# Patient Record
Sex: Female | Born: 1940
Health system: Southern US, Community
[De-identification: ages and names within clinical notes are randomized; demographics above are authoritative.]

## PROBLEM LIST (undated history)

## (undated) DIAGNOSIS — K449 Diaphragmatic hernia without obstruction or gangrene: Secondary | ICD-10-CM

## (undated) DIAGNOSIS — F32A Depression, unspecified: Secondary | ICD-10-CM

## (undated) DIAGNOSIS — I10 Essential (primary) hypertension: Secondary | ICD-10-CM

## (undated) DIAGNOSIS — K222 Esophageal obstruction: Secondary | ICD-10-CM

## (undated) DIAGNOSIS — E785 Hyperlipidemia, unspecified: Secondary | ICD-10-CM

## (undated) DIAGNOSIS — K279 Peptic ulcer, site unspecified, unspecified as acute or chronic, without hemorrhage or perforation: Secondary | ICD-10-CM

## (undated) DIAGNOSIS — Z86010 Personal history of colonic polyps: Secondary | ICD-10-CM

## (undated) DIAGNOSIS — Z860101 Personal history of adenomatous and serrated colon polyps: Secondary | ICD-10-CM

## (undated) DIAGNOSIS — Z800 Family history of malignant neoplasm of digestive organs: Secondary | ICD-10-CM

## (undated) DIAGNOSIS — F419 Anxiety disorder, unspecified: Secondary | ICD-10-CM

## (undated) DIAGNOSIS — F329 Major depressive disorder, single episode, unspecified: Secondary | ICD-10-CM

## (undated) DIAGNOSIS — R42 Dizziness and giddiness: Secondary | ICD-10-CM

## (undated) DIAGNOSIS — K5790 Diverticulosis of intestine, part unspecified, without perforation or abscess without bleeding: Secondary | ICD-10-CM

## (undated) DIAGNOSIS — K219 Gastro-esophageal reflux disease without esophagitis: Secondary | ICD-10-CM

## (undated) DIAGNOSIS — E079 Disorder of thyroid, unspecified: Secondary | ICD-10-CM

## (undated) DIAGNOSIS — A048 Other specified bacterial intestinal infections: Secondary | ICD-10-CM

## (undated) HISTORY — DX: Gastro-esophageal reflux disease without esophagitis: K21.9

## (undated) HISTORY — DX: Hyperlipidemia, unspecified: E78.5

## (undated) HISTORY — DX: Personal history of adenomatous and serrated colon polyps: Z86.0101

## (undated) HISTORY — PX: BILATERAL OOPHORECTOMY: SHX1221

## (undated) HISTORY — PX: ABDOMINAL HYSTERECTOMY: SHX81

## (undated) HISTORY — DX: Family history of malignant neoplasm of digestive organs: Z80.0

## (undated) HISTORY — DX: Diaphragmatic hernia without obstruction or gangrene: K44.9

## (undated) HISTORY — DX: Dizziness and giddiness: R42

## (undated) HISTORY — DX: Diverticulosis of intestine, part unspecified, without perforation or abscess without bleeding: K57.90

## (undated) HISTORY — DX: Peptic ulcer, site unspecified, unspecified as acute or chronic, without hemorrhage or perforation: K27.9

## (undated) HISTORY — DX: Major depressive disorder, single episode, unspecified: F32.9

## (undated) HISTORY — DX: Other specified bacterial intestinal infections: A04.8

## (undated) HISTORY — DX: Personal history of colonic polyps: Z86.010

## (undated) HISTORY — DX: Depression, unspecified: F32.A

## (undated) HISTORY — DX: Disorder of thyroid, unspecified: E07.9

## (undated) HISTORY — PX: CHOLECYSTECTOMY: SHX55

## (undated) HISTORY — DX: Essential (primary) hypertension: I10

## (undated) HISTORY — DX: Esophageal obstruction: K22.2

## (undated) HISTORY — DX: Anxiety disorder, unspecified: F41.9

---

## 1995-08-26 DIAGNOSIS — K279 Peptic ulcer, site unspecified, unspecified as acute or chronic, without hemorrhage or perforation: Secondary | ICD-10-CM

## 1995-08-26 DIAGNOSIS — A048 Other specified bacterial intestinal infections: Secondary | ICD-10-CM

## 1995-08-26 HISTORY — DX: Other specified bacterial intestinal infections: A04.8

## 1995-08-26 HISTORY — DX: Peptic ulcer, site unspecified, unspecified as acute or chronic, without hemorrhage or perforation: K27.9

## 1997-12-18 ENCOUNTER — Ambulatory Visit (HOSPITAL_COMMUNITY): Admission: RE | Admit: 1997-12-18 | Discharge: 1997-12-18 | Payer: Self-pay | Admitting: Gastroenterology

## 1999-06-14 ENCOUNTER — Encounter: Payer: Self-pay | Admitting: Internal Medicine

## 1999-06-14 ENCOUNTER — Ambulatory Visit (HOSPITAL_COMMUNITY): Admission: RE | Admit: 1999-06-14 | Discharge: 1999-06-14 | Payer: Self-pay | Admitting: Internal Medicine

## 1999-07-01 ENCOUNTER — Emergency Department (HOSPITAL_COMMUNITY): Admission: EM | Admit: 1999-07-01 | Discharge: 1999-07-01 | Payer: Self-pay | Admitting: Emergency Medicine

## 1999-08-31 ENCOUNTER — Encounter: Payer: Self-pay | Admitting: Internal Medicine

## 1999-08-31 ENCOUNTER — Inpatient Hospital Stay (HOSPITAL_COMMUNITY): Admission: EM | Admit: 1999-08-31 | Discharge: 1999-08-31 | Payer: Self-pay | Admitting: Emergency Medicine

## 2002-09-06 ENCOUNTER — Other Ambulatory Visit: Admission: RE | Admit: 2002-09-06 | Discharge: 2002-09-06 | Payer: Self-pay | Admitting: Gynecology

## 2003-10-30 ENCOUNTER — Other Ambulatory Visit: Admission: RE | Admit: 2003-10-30 | Discharge: 2003-10-30 | Payer: Self-pay | Admitting: Gynecology

## 2004-12-17 ENCOUNTER — Other Ambulatory Visit: Admission: RE | Admit: 2004-12-17 | Discharge: 2004-12-17 | Payer: Self-pay | Admitting: Gynecology

## 2005-02-19 ENCOUNTER — Ambulatory Visit: Payer: Self-pay | Admitting: Gastroenterology

## 2005-04-21 ENCOUNTER — Ambulatory Visit: Payer: Self-pay | Admitting: Gastroenterology

## 2005-12-03 ENCOUNTER — Ambulatory Visit: Payer: Self-pay | Admitting: Gastroenterology

## 2006-01-13 ENCOUNTER — Ambulatory Visit: Payer: Self-pay | Admitting: Gastroenterology

## 2006-03-16 ENCOUNTER — Other Ambulatory Visit: Admission: RE | Admit: 2006-03-16 | Discharge: 2006-03-16 | Payer: Self-pay | Admitting: Gynecology

## 2006-04-01 ENCOUNTER — Ambulatory Visit: Payer: Self-pay | Admitting: Gastroenterology

## 2006-04-07 ENCOUNTER — Ambulatory Visit: Payer: Self-pay | Admitting: Gastroenterology

## 2006-09-03 ENCOUNTER — Ambulatory Visit: Payer: Self-pay | Admitting: Gastroenterology

## 2007-05-04 ENCOUNTER — Ambulatory Visit: Payer: Self-pay | Admitting: Internal Medicine

## 2007-11-11 DIAGNOSIS — F411 Generalized anxiety disorder: Secondary | ICD-10-CM | POA: Insufficient documentation

## 2007-11-11 DIAGNOSIS — K222 Esophageal obstruction: Secondary | ICD-10-CM | POA: Insufficient documentation

## 2007-11-11 DIAGNOSIS — F3289 Other specified depressive episodes: Secondary | ICD-10-CM | POA: Insufficient documentation

## 2007-11-11 DIAGNOSIS — K449 Diaphragmatic hernia without obstruction or gangrene: Secondary | ICD-10-CM | POA: Insufficient documentation

## 2007-11-11 DIAGNOSIS — I10 Essential (primary) hypertension: Secondary | ICD-10-CM | POA: Insufficient documentation

## 2007-11-11 DIAGNOSIS — K5730 Diverticulosis of large intestine without perforation or abscess without bleeding: Secondary | ICD-10-CM | POA: Insufficient documentation

## 2007-11-11 DIAGNOSIS — K649 Unspecified hemorrhoids: Secondary | ICD-10-CM | POA: Insufficient documentation

## 2007-11-11 DIAGNOSIS — K219 Gastro-esophageal reflux disease without esophagitis: Secondary | ICD-10-CM | POA: Insufficient documentation

## 2007-11-11 DIAGNOSIS — D126 Benign neoplasm of colon, unspecified: Secondary | ICD-10-CM | POA: Insufficient documentation

## 2007-11-11 DIAGNOSIS — F329 Major depressive disorder, single episode, unspecified: Secondary | ICD-10-CM | POA: Insufficient documentation

## 2008-04-18 ENCOUNTER — Telehealth: Payer: Self-pay | Admitting: Internal Medicine

## 2008-04-24 ENCOUNTER — Telehealth: Payer: Self-pay | Admitting: Internal Medicine

## 2008-05-04 ENCOUNTER — Telehealth: Payer: Self-pay | Admitting: Internal Medicine

## 2008-07-17 ENCOUNTER — Telehealth: Payer: Self-pay | Admitting: Internal Medicine

## 2008-08-01 ENCOUNTER — Telehealth: Payer: Self-pay | Admitting: Internal Medicine

## 2008-08-08 ENCOUNTER — Encounter: Payer: Self-pay | Admitting: Internal Medicine

## 2008-08-22 ENCOUNTER — Telehealth: Payer: Self-pay | Admitting: Internal Medicine

## 2008-08-23 ENCOUNTER — Telehealth (INDEPENDENT_AMBULATORY_CARE_PROVIDER_SITE_OTHER): Payer: Self-pay | Admitting: *Deleted

## 2008-09-12 ENCOUNTER — Telehealth: Payer: Self-pay | Admitting: Internal Medicine

## 2009-05-16 ENCOUNTER — Telehealth: Payer: Self-pay | Admitting: Internal Medicine

## 2009-06-22 ENCOUNTER — Telehealth: Payer: Self-pay | Admitting: Internal Medicine

## 2009-07-12 ENCOUNTER — Telehealth: Payer: Self-pay | Admitting: Internal Medicine

## 2009-07-17 ENCOUNTER — Telehealth: Payer: Self-pay | Admitting: Internal Medicine

## 2009-07-27 ENCOUNTER — Telehealth: Payer: Self-pay | Admitting: Internal Medicine

## 2009-08-07 ENCOUNTER — Telehealth: Payer: Self-pay | Admitting: Internal Medicine

## 2009-08-20 ENCOUNTER — Telehealth: Payer: Self-pay | Admitting: Internal Medicine

## 2009-08-22 ENCOUNTER — Telehealth: Payer: Self-pay | Admitting: Internal Medicine

## 2009-09-07 ENCOUNTER — Telehealth: Payer: Self-pay | Admitting: Internal Medicine

## 2009-09-14 ENCOUNTER — Encounter: Payer: Self-pay | Admitting: Internal Medicine

## 2009-11-01 ENCOUNTER — Telehealth: Payer: Self-pay | Admitting: Internal Medicine

## 2009-12-25 ENCOUNTER — Encounter: Payer: Self-pay | Admitting: Internal Medicine

## 2010-01-31 ENCOUNTER — Encounter: Payer: Self-pay | Admitting: Internal Medicine

## 2010-04-09 ENCOUNTER — Telehealth: Payer: Self-pay | Admitting: Internal Medicine

## 2010-09-05 ENCOUNTER — Encounter: Payer: Self-pay | Admitting: Internal Medicine

## 2010-09-06 ENCOUNTER — Telehealth: Payer: Self-pay | Admitting: Internal Medicine

## 2010-09-09 ENCOUNTER — Encounter: Payer: Self-pay | Admitting: Internal Medicine

## 2010-09-26 NOTE — Progress Notes (Signed)
Summary: samples   Phone Note Call from Patient Call back at Home Phone [phone]   Caller: Patient Call For: Dr Marina Goodell Reason for Call: Talk to Nurse Summary of Call: Patient would like samples of Protonix  Initial call taken by: Tawni Levy,  April 09, 2010 12:48 PM  Follow-up for Phone Call        Advised pt. we do not have samples of Protonix.  She states she needs brand name Protonix.  I will send Rx. to pharmacy and await for them to send me a PA for brand name.   Follow-up by: Milford Cage NCMA,  April 09, 2010 2:57 PM

## 2010-09-26 NOTE — Medication Information (Signed)
Summary: Pantoprazole approved/[employer]  Pantoprazole approved/[employer]   Imported By: Sherian Rein 09/12/2010 10:49:45  _____________________________________________________________________  External Attachment:    Type:   Image     Comment:   External Document

## 2010-09-26 NOTE — Medication Information (Signed)
Summary: Pantoprazole/[facility]  Pantoprazole/[facility]   Imported By: Sherian Rein 02/04/2010 11:54:43  _____________________________________________________________________  External Attachment:    Type:   Image     Comment:   External Document

## 2010-09-26 NOTE — Progress Notes (Signed)
Summary: meds not working   Phone Note Call from Patient Call back at [employer] [phone]   Caller: Patient Call For: Dr Marina Goodell Reason for Call: Talk to Nurse Summary of Call: Patient states that Protonix is not helping it's giving her headaches and gas, wants somehting different. Initial call taken by: Tawni Levy,  September 06, 2010 10:35 AM  Follow-up for Phone Call        patient only wants to speak with [facility].  "she knows my situations" Follow-up by: Darcey Nora RN, CGRN,  September 06, 2010 10:55 AM  Additional Follow-up for Phone Call Additional follow up Details #1::        pt. states she is feeling much better now and thinks she had some kind of "bug".  She states the generic Protonix is not working as well as brand name.  She will continue on the generic for awhile and see if it works, as her insurance will not approve brand name.  Advised patient to call us Monday if symptoms come back or worsen over the weekend.  She agreed to this. Additional Follow-up by: Milford Cage NCMA,  September 06, 2010 3:34 PM

## 2010-09-26 NOTE — Progress Notes (Signed)
Summary: Samples of Medication   Phone Note Call from Patient Call back at Home Phone [phone]   Caller: Patient Call For: Dr. Marina Goodell Reason for Call: Refill Medication Summary of Call: Pt wants to know if we have any sample of Protonix Initial call taken by: Karna Christmas,  September 07, 2009 9:52 AM  Follow-up for Phone Call        Called patient and advised we do not have any samples of Protonix.   Milford Cage [facility]  September 07, 2009 1:30 PM

## 2010-09-26 NOTE — Medication Information (Signed)
Summary: Anucort/[facility]  Anucort/[facility]   Imported By: Sherian Rein 12/27/2009 11:56:39  _____________________________________________________________________  External Attachment:    Type:   Image     Comment:   External Document

## 2010-09-26 NOTE — Medication Information (Signed)
Summary: Refill request for Protonix/[facility]  Refill request for Protonix/[facility]   Imported By: Sherian Rein 09/19/2009 14:45:48  _____________________________________________________________________  External Attachment:    Type:   Image     Comment:   External Document

## 2010-09-26 NOTE — Progress Notes (Signed)
Summary: Samples of Protonix   Phone Note Call from Patient Call back at Home Phone [phone]   Caller: Patient Call For: Dr. Marina Goodell Reason for Call: Talk to Nurse Summary of Call: Pt is wanting to know if we have any samples of Protonix.[employer] will not cover Protonix just the generic Initial call taken by: Karna Christmas,  November 01, 2009 9:30 AM  Follow-up for Phone Call        Called patient and advised we do not have any samples of Protonix.  She stated she had gotten generic of protonix since insurance will pay for it and will try for awhile to see if it works. I advised her to call me if it doesn't.  She agreed.  Follow-up by: Milford Cage NCMA,  November 01, 2009 9:33 AM

## 2010-09-26 NOTE — Medication Information (Signed)
Summary: Prior autho & approved for Pantoprazole/[employer]  Prior autho & approved for Pantoprazole/[employer]   Imported By: Sherian Rein 09/11/2010 14:57:25  _____________________________________________________________________  External Attachment:    Type:   Image     Comment:   External Document

## 2010-10-28 ENCOUNTER — Encounter: Payer: Self-pay | Admitting: Internal Medicine

## 2011-01-06 ENCOUNTER — Telehealth: Payer: Self-pay | Admitting: Internal Medicine

## 2011-01-06 NOTE — Telephone Encounter (Signed)
Pt states that she is having problems with her reflux. Pt takes Protonix 40mg  BID but reports she is still having problems with burning and indigestion. Pt states that she has been sleeping in her recliner because when she lays down the acid comes up in her throat and burns. Pt requests to be seen. Appt made for pt to see Amy Esterwood PA 01/08/11@10 :30am.

## 2011-01-07 NOTE — Assessment & Plan Note (Signed)
[facility]                         GASTROENTEROLOGY OFFICE NOTE   NAME:Susan Contreras, Susan Contreras                         MRN:          [id number]  DATE:05/04/2007                            DOB:          May 22, 1941    REASON FOR EVALUATION:  Establish for GI care.   HISTORY:  This is a 70 year old female with a history of hypertension,  gastroesophageal reflux disease, and colon polyps. I actually saw the  patient in 1997 for dysphagia. She was found to have esophagitis with a  stricture as well as gastritis. She was positive for H. Pylori. Her  stricture was dilated. Her H. Pylori treated. She was placed on proton  pump inhibitor therapy. She subsequently came under the care of Dr.  Corinda Gubler in 1998, who has been seeing the patient. She has had several  colonoscopies, last of which in 2007 was negative except for  diverticulosis and hemorrhoids. Her last endoscopy was performed at that  same time. She was noted to have a hiatal hernia and a stricture. She  has been on Protonix 40 mg daily with occasional b.i.d. dosage for  breakthrough. On the medication no significant heartburn or indigestion.  No dysphagia. No other active GI symptoms. She requests a prescription  for her Protonix as well as Protonix samples.   CURRENT MEDICATIONS:  Include: Avapro, Protonix, Vivelle hormone patch,  gas-x, and Tylenol.   FAMILY HISTORY:  Per diagnostic evaluation form.   SOCIAL HISTORY:  Per diagnostic evaluation form.   REVIEW OF SYSTEMS:  Per diagnostic evaluation form.   PHYSICAL EXAMINATION:  Well-appearing female in no acute distress. Her  blood pressure is 120/76, heart rate 64, weight is 166.6 pounds.  HEENT: Sclera are anicteric, conjunctivae are pink. Oral mucosa is  intact. No adenopathy.  LUNGS: Clear.  HEART: Regular.  ABDOMEN: Soft without tenderness, masses, or hernia.  EXTREMITIES: Without edema.   IMPRESSION:  1. Gastroesophageal reflux disease with  a history of peptic stricture.      Currently asymptomatic on Protonix.  2. Remote history of polyps. Last colonoscopy with diverticulosis      only.  3. General medical problems, under the care of Dr. Christell Constant.   RECOMMENDATIONS:  1. Continue Protonix 40 mg daily. A prescription with multiple refills      has been provided as well      as samples at her request.  2. Resume general medical care with Dr. Christell Constant. Gastrointestinal follow      up p.r.n.     Wilhemina Bonito. Marina Goodell, MD  Electronically Signed    JNP/MedQ  DD: 05/04/2007  DT: 05/05/2007  Job #: [id number]

## 2011-01-08 ENCOUNTER — Ambulatory Visit: Payer: Self-pay | Admitting: Physician Assistant

## 2011-01-10 NOTE — Assessment & Plan Note (Signed)
[facility]                         GASTROENTEROLOGY OFFICE NOTE   NAME:[name], [name]                         MRN:          [id number]  DATE:09/03/2006                            DOB:          04/01/1941    Susan Contreras comes in, says she is having some epigastric problems and right  upper quadrant discomfort, burping, belching symptoms.  It sounded to me  like gallbladder.  She says when she uses butter that it really sets it  off the worst.  Some reflux symptoms, some change in bowel habits.  She  has been taking her Protonix and Flora-Q.  I told her that I thought she  ought to reconsider the gallbladder as the culprit here and give it two  or three more weeks maybe and see how she does, but if her symptoms  persist with the same type of thing - and I gave her some literature on  gallbladder disease and laparoscopic cholecystectomy, intestinal gas,  and so on, with low fat diet - that she should return to see Dr. Wenda Low about having a cholecystectomy.  She is somewhat anxious about  it.  She said she would consider doing this.   PHYSICAL EXAMINATION:  Weight 170, blood pressure 102/76, pulse 78 and  regular.  Neck, heart and extremities are all unremarkable.   IMPRESSION:  1. Recurrent epigastric discomfort, probably related to gallbladder      disease.  2. History of gastroesophageal reflux disease.  3. Anxiety/depression.  4. Mild obesity.  5. Hypertension.  6. Status post hysterectomy and bilateral oophorectomy.   Recommendation is to take the Protonix b.i.d., I gave her some samples,  and Flora-Q, and told her to rethink the cholecystectomy if her symptoms  recur.     Ulyess Mort, MD  Electronically Signed    SML/MedQ  DD: 09/03/2006  DT: 09/03/2006  Job #: [id number]   cc:   [place] Daphine Deutscher, MD

## 2011-01-10 NOTE — Procedures (Signed)
[facility]                            ABDOMINAL ULTRASOUND REPORT   NAME:JOYCETaeko, Susan Contreras                         MRN:          [id number]  DATE:04/07/2006                            DOB:          1940-11-29    ACCESSION NUMBER:  [id number]   READING PHYSICIAN:  Ulyess Mort, M.D.   INDICATIONS:  Right upper quadrant pain.   RESULTS:  Abdominal aorta is normal measuring 1.5 cm.  The inferior vena  cava is normal.   The pancreas was well-visualized and appears normal throughout.   The gallbladder had a 14 mm mobile shadowing stone.  The walls were slightly  thickened at 3.4 mm.  The common bile duct was at the upper limits of normal  measuring 7.7 mm.   The liver was within normal limits.   The right kidney and left kidney were all within normal limits, and the  spleen was within normal limits.   IMPRESSION:  Cholelithiasis as described with slightly thickened walls of  the gallbladder consistent with possible cholecystitis.  There is no  evidence of pericholecystic fluid.                                   Ulyess Mort, MD   SML/MedQ  DD:  04/07/2006  DT:  04/08/2006  Job #:  [id number]

## 2011-01-10 NOTE — Assessment & Plan Note (Signed)
[facility]                                 ON-CALL NOTE   NAME:Susan Contreras, Susan Contreras                         MRN:          [id number]  DATE:08/22/2006                            DOB:          1940/10/20    Telephone number [phone].  Time of phone call:  10:30 a.m.  Gastroenterologist:  Dr. Victorino Dike   [name] calls today noting right upper quadrant discomfort that has  been present for an hour or two.  She has a history of cholelithiasis  diagnosed by Dr. Victorino Dike several months ago.  She had a subsequent  appointment with Dr. Luretha Murphy, and the decision was made to manage  her cholelithiasis expectantly, as she had either very minimal or no  symptoms.  She relates 2-3 episodes of mild-to-moderate postprandial  right upper quadrant abdominal pain that has resolved spontaneously.  She has not discussed these symptoms with Dr. Corinda Gubler or Dr. Luretha Murphy.  She has had recurrent symptoms today.  She has no fevers,  chills, back pain, chest pain, nausea, vomiting, change in bowel habits,  or gastrointestinal bleeding.  She feels her symptoms are mild, and she  does not want to come to the emergency room for evaluation as she does  not feel her symptoms are that severe.  I have advised her to maintain  clear liquids for the next 24 hours, and use Tylenol every 6 hours, and  also Advil every 6 hours if the Tylenol is not completely effective.  If  her symptoms do not completely resolve over the next 24 hours, or if  they worsen in any way, I have advised her to contact me, Dr. Luretha Murphy, or present to the emergency room for further evaluation.  If her  symptoms resolve, she should have a followup appointment with Dr.  Luretha Murphy or Dr. Victorino Dike within the next 1-2 weeks.     Venita Lick. Russella Dar, MD, [facility]  Electronically Signed    MTS/MedQ  DD: 08/22/2006  DT: 08/22/2006  Job #: [id number]

## 2011-01-10 NOTE — Assessment & Plan Note (Signed)
[facility]                           GASTROENTEROLOGY OFFICE NOTE   NAME:Susan Contreras, Susan Contreras                         MRN:          [id number]  DATE:04/01/2006                            DOB:          28-May-1941    The patient comes in on August 8.  Says she is having attacks in her  abdomen, thinks it might be a gallbladder.  Has been having loose stools  with mucus as well and some dark stools.  Decreased appetite.  Says her  stools occur after eating along with some abdominal pain that started Friday  p.m. like gas.  Says there has been a great deal of burping and belching.  Pain starts in waist, neckline and epigastric area.  Feels like she has a  great deal of gas.  It is interesting that she had similar symptoms on April  11 when I saw her and we did upper endoscopy and colonoscopy on her.  The  procedures only revealed a small hiatal hernia and some mild to moderate  diverticulosis and some hemorrhoids.   PHYSICAL EXAMINATION:  Today, she weighed 167, blood pressure 108/70, pulse  70 and regular.  NECK, CARDIAC, EXTREMITIES:  All unremarkable.   IMPRESSION:  1. Epigastric pain, rule out gallbladder versus irritable bowel syndrome      with change in bowel habits.  2. Gastroesophageal reflux disease.  3. Status post hysterectomy and bilateral oophorectomy.  4. Hypertension.  5. Mild obesity.  6. Mild anxiety depression.   RECOMMENDATIONS:  Try some Xifaxan 200 mg one b.i.d. for 10 days.  Get some  routine labs on her, coloscreens on her. I am going to put her on Probiotic  and get an ultrasound of her abdomen to rule out gallbladder disease.                                   Ulyess Mort, MD   SML/MedQ  DD:  04/02/2006  DT:  04/02/2006  Job #:  [id number]

## 2011-01-13 ENCOUNTER — Telehealth: Payer: Self-pay | Admitting: Internal Medicine

## 2011-01-13 NOTE — Telephone Encounter (Signed)
Pt wanted to know if we had any am appts that she could switch to for Wed. Let pt know there were no other appts available. Pt kept her scheduled appt.

## 2011-01-15 ENCOUNTER — Other Ambulatory Visit (INDEPENDENT_AMBULATORY_CARE_PROVIDER_SITE_OTHER): Payer: Medicare Other

## 2011-01-15 ENCOUNTER — Ambulatory Visit (INDEPENDENT_AMBULATORY_CARE_PROVIDER_SITE_OTHER): Payer: Medicare Other | Admitting: Physician Assistant

## 2011-01-15 ENCOUNTER — Encounter: Payer: Self-pay | Admitting: Physician Assistant

## 2011-01-15 VITALS — BP 132/68 | HR 76 | Ht 62.0 in | Wt 187.0 lb

## 2011-01-15 DIAGNOSIS — R110 Nausea: Secondary | ICD-10-CM

## 2011-01-15 DIAGNOSIS — R0602 Shortness of breath: Secondary | ICD-10-CM

## 2011-01-15 DIAGNOSIS — K8020 Calculus of gallbladder without cholecystitis without obstruction: Secondary | ICD-10-CM | POA: Insufficient documentation

## 2011-01-15 DIAGNOSIS — R0600 Dyspnea, unspecified: Secondary | ICD-10-CM

## 2011-01-15 DIAGNOSIS — K219 Gastro-esophageal reflux disease without esophagitis: Secondary | ICD-10-CM

## 2011-01-15 DIAGNOSIS — R1011 Right upper quadrant pain: Secondary | ICD-10-CM

## 2011-01-15 DIAGNOSIS — R0989 Other specified symptoms and signs involving the circulatory and respiratory systems: Secondary | ICD-10-CM

## 2011-01-15 DIAGNOSIS — R0609 Other forms of dyspnea: Secondary | ICD-10-CM

## 2011-01-15 DIAGNOSIS — R143 Flatulence: Secondary | ICD-10-CM

## 2011-01-15 DIAGNOSIS — Z800 Family history of malignant neoplasm of digestive organs: Secondary | ICD-10-CM

## 2011-01-15 DIAGNOSIS — R141 Gas pain: Secondary | ICD-10-CM

## 2011-01-15 LAB — HEPATIC FUNCTION PANEL
AST: 21 U/L (ref 0–37)
Total Bilirubin: 0.7 mg/dL (ref 0.3–1.2)

## 2011-01-15 LAB — CBC WITH DIFFERENTIAL/PLATELET
Basophils Relative: 0.5 % (ref 0.0–3.0)
Eosinophils Relative: 1.5 % (ref 0.0–5.0)
HCT: 37.2 % (ref 36.0–46.0)
Lymphs Abs: 2.8 10*3/uL (ref 0.7–4.0)
MCV: 89.3 fl (ref 78.0–100.0)
Monocytes Absolute: 0.6 10*3/uL (ref 0.1–1.0)
Neutro Abs: 4.6 10*3/uL (ref 1.4–7.7)
RBC: 4.17 Mil/uL (ref 3.87–5.11)
WBC: 8.1 10*3/uL (ref 4.5–10.5)

## 2011-01-15 NOTE — Progress Notes (Signed)
Subjective:    Patient ID: Susan Contreras, female    DOB: July 08, 1941, 70 y.o.   MRN: [id number]  HPI Susan Contreras is a pleasant 70 year old white female known to I. Dr. Yancey Flemings and former patient of Dr. Terrial Rhodes. She has history of diverticulosis ,colon polyps, hemorrhoids, chronic GERD and history of esophageal stricture. Last EGD was done in 2007 showing a 2 cm hiatal hernia and esophageal stricture which was  not dilated. Colonoscopy May 2007 revealed diffuse diverticulosis, and internal and external hemorrhoids. She did have an adenomatous colon polyp in 2004 and has family history of colon cancer.  Patient comes in today with complaints of 2-3 months of frequent nausea sometimes associated with lightheadedness. She says she feels that her symptoms are worse after certain meals including of french fries green potatoes etc. She's not had any vomiting and her weight has been stable. She has had some intermittent right upper quadrant discomfort. She also mentions ongoing problems with belching and burping. She has no complaint of dysphagia or odynophagia. She has no heartburn symptoms but does have frequent reflux particularly at night. She says she generally sleeps in a recliner due to our reflux and also says that she feels somewhat "smothery" if she lies flat. This is been going on over the past few months. She denies any exertional dyspnea and denies any chest pain. She also mentions 18 bilaterally in her shoulders and her upper chest which seems to be worse in the morning and better after she gets up and moves around. She has been on generic proton next 40 mg one to 2 times daily over the past few years and says that she does not feel that the generic medication Works as well and that she frequently takes a second dose of Protonix though feels that time so Works just as well as the second dose.  Patient had a prior ultrasound done in 2007 which did document cholelithiasis and slightly thickened walls  of the gallbladder. Patient states that she was seen by surgery at that time and decision was made not to proceed with cholecystectomy and she really was not having any symptoms.    Review of Systems  Constitutional: Negative.   HENT: Positive for postnasal drip.   Eyes: Negative.   Respiratory: Positive for shortness of breath.   Cardiovascular: Negative.   Gastrointestinal: Positive for nausea and abdominal pain.  Genitourinary: Negative.   Musculoskeletal: Negative.   Skin: Negative.   Neurological: Negative.   Hematological: Negative.   Psychiatric/Behavioral: Negative.    Outpatient Encounter Prescriptions as of 01/15/2011  Medication Sig Dispense Refill  . hydrocortisone (ANUSOL-HC) 25 MG suppository Place 25 mg rectally 2 (two) times daily.        . irbesartan (AVAPRO) 150 MG tablet Take 150 mg by mouth at bedtime.        Marland Kitchen levothyroxine (SYNTHROID, LEVOTHROID) 50 MCG tablet Take 50 mcg by mouth daily.        . Oxymetazoline HCl (NASAL DECONGESTANT SPRAY NA) by Nasal route. As needed       . pantoprazole (PROTONIX) 40 MG tablet Take 40 mg by mouth daily.             Objective:   Physical Exam Well-developed white female in no acute distress, awake, pleasant, alert and oriented x3  HEENT; nontraumatic normocephalic EOMI PERRLA sclera anicteric  Neck; supple no JVD  Cardiovascular; regular rate and rhythm with S1-S2 no murmur rub or gallop  Pulmonary ;clear bilaterally  Abdomen;  large soft, no focal tenderness no palpable mass or hepatosplenomegaly bowel sounds active  Rectal; not done Skin warm and dry benign  Psych; mood and affect normal an appropriate        Assessment & Plan:  #72 70 year old white female with history of chronic GERD, previously documented cholelithiasis and two-month history of frequent nausea, increased belching intermittent right upper quadrant discomfort and increased reflux symptoms. Rule out chronic cholecystitis.  Plan; Will check CBC hepatic  panel and BNP today(due to smothery sensation) Schedule for upper abdominal ultrasound Have reviewed antireflux regimen and provided her with educational information including continued elevation of the head of the bed and n.p.o. status for 2-3 hours prior to bedtime but she has not been doing Trial of Exelon 60 mg by mouth every morning instead of generic proton next, it will provide her with samples and if this is effective a prescription. Further plans pending ultrasound.  #2 Prior history of adenomatous colon polyps, negative colonoscopy 2007 and family history of colon cancer. She will be due for followup colonoscopy and this will be addressed after her more acute issues have been sorted out  #3 Diverticulosis.

## 2011-01-15 NOTE — Patient Instructions (Signed)
Please go to the basement level to have your labs drawn.  We have scheduled the Ultrasound at [facility] Directions and brochure provided. Dexilant samples , take 1 cap 30 min before breakfast.  If this helps we can call in a prescription.  Esophagitis (Heartburn) Esophagitis (heartburn) is a painful, burning sensation in the chest. It may feel worse in certain positions, such as lying down or bending over. It is caused by stomach acid backing up into the tube that carries food from the mouth down to the stomach (lower esophagus). TREATMENT There are a number of non-prescription medicines used to treat heartburn, including:  Antacids.   Acid reducers (also called H-2 blockers).   Proton-pump inhibitors.  HOME CARE INSTRUCTIONS  Raise the head of your bead by putting blocks under the legs.   Eat 2-3 hours before going to bed.   Stop smoking.   Try to reach and maintain a healthy weight.   Do not eat just a few very large meals. Instead, eat many smaller meals throughout the day.   Try to identify foods and beverages that make your symptoms worse, and avoid these.   Avoid tight clothing.   Do not exercise right after eating.  SEEK IMMEDIATE MEDICAL CARE IF YOU:  Have severe chest pain that goes down your arm, or into your jaw or neck.   Feel sweaty, dizzy, or lightheaded.   Are short of breath.   Throw up (vomit) blood.   Have difficulty or pain with swallowing.   Have bloody or black, tarry stools.   Have bouts of heartburn more than three times a week for more than two weeks.  Document Released: 09/18/2004 Document Re-Released: 11/05/2009 [facility] Patient Information 2011 [place], [place].

## 2011-01-15 NOTE — Progress Notes (Signed)
i agree with the plan in this note 

## 2011-01-17 ENCOUNTER — Other Ambulatory Visit (HOSPITAL_COMMUNITY): Payer: Medicare Other

## 2011-01-21 ENCOUNTER — Telehealth: Payer: Self-pay | Admitting: *Deleted

## 2011-01-21 NOTE — Telephone Encounter (Signed)
Message copied by Daphine Deutscher on Tue Jan 21, 2011  3:36 PM ------      Message from: [place], [place] S      Created: Tue Jan 21, 2011  3:00 PM       Please let Erabella know her labs are all normal

## 2011-01-21 NOTE — Telephone Encounter (Signed)
Patient given results as per Amy Esterwood, PA. 

## 2011-01-22 ENCOUNTER — Ambulatory Visit (HOSPITAL_COMMUNITY)
Admission: RE | Admit: 2011-01-22 | Discharge: 2011-01-22 | Disposition: A | Discharge status: Home / Self Care | Payer: Medicare Other | Source: Ambulatory Visit | Attending: Physician Assistant | Admitting: Physician Assistant

## 2011-01-22 DIAGNOSIS — R0602 Shortness of breath: Secondary | ICD-10-CM

## 2011-01-22 DIAGNOSIS — K8020 Calculus of gallbladder without cholecystitis without obstruction: Secondary | ICD-10-CM | POA: Insufficient documentation

## 2011-01-22 DIAGNOSIS — R1011 Right upper quadrant pain: Secondary | ICD-10-CM

## 2011-01-22 DIAGNOSIS — K219 Gastro-esophageal reflux disease without esophagitis: Secondary | ICD-10-CM | POA: Insufficient documentation

## 2011-01-22 DIAGNOSIS — R110 Nausea: Secondary | ICD-10-CM | POA: Insufficient documentation

## 2011-01-22 DIAGNOSIS — R141 Gas pain: Secondary | ICD-10-CM

## 2011-01-22 DIAGNOSIS — R0600 Dyspnea, unspecified: Secondary | ICD-10-CM

## 2011-02-01 ENCOUNTER — Encounter: Payer: Self-pay | Admitting: Internal Medicine

## 2011-02-03 ENCOUNTER — Other Ambulatory Visit: Payer: Self-pay | Admitting: Internal Medicine

## 2011-02-03 NOTE — Telephone Encounter (Signed)
Forwarded to you to call patient with results of Ultrasound.  Dr. Lamar Sprinkles patient.  I will do Dexilant refill and PA.  Thanks.

## 2011-02-03 NOTE — Telephone Encounter (Signed)
Notified pt that her U/S showed a gallstone and per Mike Gip, PA, we need to make a surgical referral. Pt stated she saw Dr Luretha Murphy for this earlier. Pt stated she's ok most of the time, but get nauseated with fried foods, especially french fries. Informed pt I will fax info to Dr Pedro Earls ofc for the 03/13/11 appt at 0930am.

## 2011-02-04 ENCOUNTER — Telehealth: Payer: Self-pay | Admitting: Internal Medicine

## 2011-02-04 ENCOUNTER — Other Ambulatory Visit: Payer: Self-pay | Admitting: Internal Medicine

## 2011-02-04 MED ORDER — DEXLANSOPRAZOLE 60 MG PO CPDR: 60.0000 mg | DELAYED_RELEASE_CAPSULE | Freq: Every day | ORAL | Status: DC

## 2011-02-04 NOTE — Telephone Encounter (Signed)
Rx. For Dexilant sent to pharmacy [facility]

## 2011-02-06 ENCOUNTER — Telehealth: Payer: Self-pay | Admitting: Internal Medicine

## 2011-02-06 NOTE — Telephone Encounter (Signed)
Spoke to patient and waiting for Britta Mccreedy to speak to [employer].  Pt said it is 152.00 a month for the Dexliant but she also said her ins will cover it.  I am not sure of these details.  I did put samples of front desk for the pt.

## 2011-02-07 ENCOUNTER — Telehealth: Payer: Self-pay

## 2011-02-07 NOTE — Telephone Encounter (Signed)
Pt wants [place] results. Left message with the pts husband. Pt has one large gallstone and per Mike Gip PA the pt should see a Careers adviser. Husband will have the pt call us back.  Notes Recorded by Mike Gip, PA on 01/27/2011 at 11:12 AM Please call pt , and let her know the US shows one fairly large gallstone in the gallbladder-see how she is feeling. I would like to set her up an appt to talk to a surgeon.

## 2011-02-11 NOTE — Telephone Encounter (Signed)
Spoke with pt and she states that she has an appt to see Dr. Daphine Deutscher 03/13/11.

## 2011-02-12 ENCOUNTER — Telehealth: Payer: Self-pay | Admitting: *Deleted

## 2011-02-12 NOTE — Telephone Encounter (Signed)
Message copied by Francia Greaves on Wed Feb 12, 2011  2:51 PM ------      Message from: [place], [place] R. R      Created: Tue Feb 11, 2011  4:13 PM      Regarding: Dexilant       Pt is calling wanting to know if a prior Berkley Harvey has been done for her Dexilant. Pharmacy tells pt they have sent it to our office. Do you know about this:?

## 2011-02-12 NOTE — Telephone Encounter (Signed)
[employer] and spoke with [place].  She approved the Dexilant until 08-25-11.  She is faxing approval letter to me and I will fax to pharmacy.  Called patient and advised.

## 2011-02-13 NOTE — Telephone Encounter (Signed)
Dexilant approved til 08/25/11  Pt. Notified and pharmacy notified.

## 2011-03-13 ENCOUNTER — Ambulatory Visit (INDEPENDENT_AMBULATORY_CARE_PROVIDER_SITE_OTHER): Payer: Medicare Other | Admitting: Surgery

## 2011-03-13 ENCOUNTER — Encounter (INDEPENDENT_AMBULATORY_CARE_PROVIDER_SITE_OTHER): Payer: Self-pay | Admitting: Surgery

## 2011-03-13 VITALS — BP 132/80 | HR 80 | Temp 97.1°F | Ht 62.0 in | Wt 184.4 lb

## 2011-03-13 DIAGNOSIS — K8020 Calculus of gallbladder without cholecystitis without obstruction: Secondary | ICD-10-CM

## 2011-03-13 NOTE — Progress Notes (Signed)
70 year old white female comes back with gallstones. I saw her in September of 2007 with gallstones referred by Victorino Dike.  Since she did not have any symptoms referable to this she decided to wait.  At the present time she is saying that she is really not having any symptoms although she no she probably needs to have something done about it. She now has a 2 cm gallstone noted on ultrasound. I reviewed the study. I explained the procedure in some detail and gave her a booklet on upchucked cholecystectomy. She would like to have this done after she's having a home remodeling done.  Past medical history medications Avapro one 50 mg daily, Dexon 60 mg daily, levothyroxine 50 mcg daily. Allergies to Biaxin and Flagyl. No latex allergies. Prior surgery includes a lateral nephrectomy and hysterectomy. Patient does not smoke or drink.  Physical exam 5 feet 2 inches weight about 184.6 feeling.  Chest clear to auscultation heart sinus rhythm no murmurs. Abdomen nontender with well-healed incisions. Extremity exam full range of motion no cyanosis edema clubbing.  Impression gallstones with some symptoms intermittently. Once planned laparoscopic cholecystectomy [facility] long in a few weeks.

## 2011-03-20 ENCOUNTER — Ambulatory Visit (INDEPENDENT_AMBULATORY_CARE_PROVIDER_SITE_OTHER): Payer: Medicare Other | Admitting: Surgery

## 2011-04-18 ENCOUNTER — Ambulatory Visit (HOSPITAL_COMMUNITY): Admission: RE | Admit: 2011-04-18 | Payer: Medicare Other | Source: Ambulatory Visit | Admitting: Surgery

## 2011-04-25 ENCOUNTER — Encounter: Payer: Self-pay | Admitting: Internal Medicine

## 2011-04-25 ENCOUNTER — Other Ambulatory Visit: Payer: Self-pay | Admitting: Internal Medicine

## 2011-04-25 NOTE — Telephone Encounter (Signed)
Error

## 2011-04-29 ENCOUNTER — Telehealth: Payer: Self-pay | Admitting: Internal Medicine

## 2011-05-05 NOTE — Telephone Encounter (Signed)
Called patient and prescription has been sent last week.

## 2011-06-16 ENCOUNTER — Other Ambulatory Visit (INDEPENDENT_AMBULATORY_CARE_PROVIDER_SITE_OTHER): Payer: Self-pay | Admitting: Surgery

## 2011-06-16 ENCOUNTER — Ambulatory Visit (HOSPITAL_COMMUNITY)
Admission: RE | Admit: 2011-06-16 | Discharge: 2011-06-16 | Disposition: A | Discharge status: Home / Self Care | Payer: Medicare Other | Source: Ambulatory Visit | Attending: Surgery | Admitting: Surgery

## 2011-06-16 ENCOUNTER — Encounter (HOSPITAL_COMMUNITY): Payer: Medicare Other

## 2011-06-16 DIAGNOSIS — Z01812 Encounter for preprocedural laboratory examination: Secondary | ICD-10-CM | POA: Insufficient documentation

## 2011-06-16 DIAGNOSIS — K8020 Calculus of gallbladder without cholecystitis without obstruction: Secondary | ICD-10-CM | POA: Insufficient documentation

## 2011-06-16 DIAGNOSIS — R059 Cough, unspecified: Secondary | ICD-10-CM | POA: Insufficient documentation

## 2011-06-16 DIAGNOSIS — Z01811 Encounter for preprocedural respiratory examination: Secondary | ICD-10-CM | POA: Insufficient documentation

## 2011-06-16 DIAGNOSIS — I10 Essential (primary) hypertension: Secondary | ICD-10-CM | POA: Insufficient documentation

## 2011-06-16 DIAGNOSIS — R05 Cough: Secondary | ICD-10-CM | POA: Insufficient documentation

## 2011-06-16 DIAGNOSIS — Z01818 Encounter for other preprocedural examination: Secondary | ICD-10-CM

## 2011-06-16 DIAGNOSIS — R9431 Abnormal electrocardiogram [ECG] [EKG]: Secondary | ICD-10-CM | POA: Insufficient documentation

## 2011-06-16 DIAGNOSIS — Z01810 Encounter for preprocedural cardiovascular examination: Secondary | ICD-10-CM | POA: Insufficient documentation

## 2011-06-16 LAB — CBC
HCT: 40 % (ref 36.0–46.0)
MCH: 28.7 pg (ref 26.0–34.0)
MCHC: 32.3 g/dL (ref 30.0–36.0)
MCV: 89.1 fL (ref 78.0–100.0)
Platelets: 306 10*3/uL (ref 150–400)
RDW: 13.6 % (ref 11.5–15.5)
WBC: 7.3 10*3/uL (ref 4.0–10.5)

## 2011-06-16 LAB — BASIC METABOLIC PANEL
BUN: 12 mg/dL (ref 6–23)
Calcium: 10.1 mg/dL (ref 8.4–10.5)
Creatinine, Ser: 1.2 mg/dL — ABNORMAL HIGH (ref 0.50–1.10)
GFR calc Af Amer: 53 mL/min — ABNORMAL LOW (ref >90)
GFR calc non Af Amer: 45 mL/min — ABNORMAL LOW (ref >90)

## 2011-06-16 LAB — SURGICAL PCR SCREEN: MRSA, PCR: NEGATIVE

## 2011-06-18 ENCOUNTER — Ambulatory Visit (HOSPITAL_COMMUNITY): Payer: Medicare Other

## 2011-06-18 ENCOUNTER — Other Ambulatory Visit (INDEPENDENT_AMBULATORY_CARE_PROVIDER_SITE_OTHER): Payer: Self-pay | Admitting: Surgery

## 2011-06-18 ENCOUNTER — Inpatient Hospital Stay (HOSPITAL_COMMUNITY)
Admission: RE | Admit: 2011-06-18 | Discharge: 2011-06-19 | DRG: 419 | Disposition: A | Discharge status: Home / Self Care | Payer: Medicare Other | Source: Ambulatory Visit | Attending: Surgery | Admitting: Surgery

## 2011-06-18 DIAGNOSIS — I10 Essential (primary) hypertension: Secondary | ICD-10-CM | POA: Diagnosis present

## 2011-06-18 DIAGNOSIS — K8010 Calculus of gallbladder with chronic cholecystitis without obstruction: Secondary | ICD-10-CM

## 2011-06-18 DIAGNOSIS — F3289 Other specified depressive episodes: Secondary | ICD-10-CM | POA: Diagnosis present

## 2011-06-18 DIAGNOSIS — F329 Major depressive disorder, single episode, unspecified: Secondary | ICD-10-CM | POA: Diagnosis present

## 2011-06-18 DIAGNOSIS — Z01812 Encounter for preprocedural laboratory examination: Secondary | ICD-10-CM

## 2011-06-25 NOTE — Op Note (Signed)
  Susan Contreras, Susan Contreras                ACCOUNT NO.:  [id number]  MEDICAL RECORD NO.:  [id number]  LOCATION:  1528                         FACILITY:  [facility]  PHYSICIAN:  Thornton Park. Daphine Deutscher, MD  DATE OF BIRTH:  [birth year]  DATE OF PROCEDURE: DATE OF DISCHARGE:                              OPERATIVE REPORT   PREOPERATIVE DIAGNOSES:  Chronic cholecystitis and cholelithiasis.  POSTOPERATIVE DIAGNOSES:  Chronic cholecystitis and cholelithiasis.  PROCEDURE:  Laparoscopic cholecystectomy with intraoperative cholangiogram.  SURGEON:  Thornton Park. Daphine Deutscher, MD  ASSISTANT:  Lodema Pilot, MD  ANESTHESIA:  General endotracheal.  DESCRIPTION OF PROCEDURE:  This 70 year old white female was taken to room 1 on Wednesday, June 18, 2011, given general anesthesia.  The abdomen was prepped with PCMX and draped sterilely.  A time-out was performed.  Access to the abdomen was achieved through the right upper quadrant using a 0-degree 5 mm Optiview.  A 10/11 was placed in the upper midline and then 5 down below the umbilicus for the camera.  A second 5 was placed over on the right.  Gallbladder was adherent to some omentum stuck up around and this was taken down.  I went dissected Calot triangle and got a critical view.  I put a clip upon the gallbladder and incised the cystic duct and took a dynamic cholangiogram which did not show any intrahepatic stones or common duct stones.  The cystic duct was then triple clipped and divided.  Cystic artery was triple clipped and divided and the gallbladder was removed from the gallbladder bed.  I did enter it on the backside and a little bit of bile spilled out, but no stones.  The gallbladder was then placed in a bag and brought out through the upper midline incision after I did dilate a little bit because there was 1 solitary large stone in the gallbladder.  Went back and reinspected the gallbladder bed.  No bleeding or bile leaks were noted.  There was a  posterior branch of the vessel that I had clipped about midway up, but it too was not bleeding.  Went ahead and injected all the port sites with some Marcaine and then deflated the abdomen, closing with 4-0 Vicryl, benzoin, and Steri-Strips.  The patient tolerated the procedure well, was taken to the recovery room in satisfactory condition.     Thornton Park Daphine Deutscher, MD     MBM/MEDQ  D:  06/18/2011  T:  06/18/2011  Job:  [id number]  cc:   Corinda Gubler, GI  Electronically Signed by Luretha Murphy MD on 06/25/2011 07:28:45 AM

## 2011-06-25 NOTE — Discharge Summary (Signed)
  NAMEADREAM, PARZYCH                ACCOUNT NO.:  [id number]  MEDICAL RECORD NO.:  [id number]  LOCATION:  1528                         FACILITY:  [facility]  PHYSICIAN:  Thornton Park. Daphine Deutscher, MD  DATE OF BIRTH:  Jan 16, 1941  DATE OF ADMISSION:  06/18/2011 DATE OF DISCHARGE:  06/19/2011                              DISCHARGE SUMMARY   ADMITTING DIAGNOSIS:  Chronic cholecystitis.  DISCHARGE DIAGNOSIS:  Chronic cholecystitis.  PROCEDURE:  Laparoscopic cholecystectomy with intraoperative cholangiogram.  HOSPITAL COURSE:  Susan Contreras is a 70 year old lady with a long- standing gallstones that have become more symptomatic.  She underwent a laparoscopic cholecystectomy with a normal-looking intraoperative cholangiogram.  Postoperatively, she did well, was ready for discharge on postop day 1.  Condition good.  She was given a script for Vicodin to take as needed for pain.  Instructed to return to see me in the office in 3 weeks.  FINAL DIAGNOSIS:  Chronic cholecystitis and cholelithiasis.     Thornton Park Daphine Deutscher, MD     MBM/MEDQ  D:  06/19/2011  T:  06/19/2011  Job:  [id number]  Electronically Signed by Luretha Murphy MD on 06/25/2011 91:47:82 AM

## 2011-07-10 ENCOUNTER — Encounter (INDEPENDENT_AMBULATORY_CARE_PROVIDER_SITE_OTHER): Payer: Self-pay | Admitting: Surgery

## 2011-07-10 ENCOUNTER — Ambulatory Visit (INDEPENDENT_AMBULATORY_CARE_PROVIDER_SITE_OTHER): Payer: Medicare Other | Admitting: Surgery

## 2011-07-10 VITALS — BP 118/72 | HR 80 | Temp 97.4°F | Resp 16 | Ht 61.0 in | Wt 182.0 lb

## 2011-07-10 DIAGNOSIS — K819 Cholecystitis, unspecified: Secondary | ICD-10-CM

## 2011-07-10 NOTE — Patient Instructions (Signed)
May resume diet ad lib.

## 2011-07-10 NOTE — Progress Notes (Signed)
Susan Contreras comes in today after her lap or scopic cholecystectomy with intraoperative cholangiogram. Her incisions have healed fine. She still little bit tired from the surgery but I think she's getting along very well. I told her that she did not have any dietary restrictions at that diminished appetite is a good opportunity to lose weight. I will be happy to see her again were needed. Return p.r.n. Impression doing well after laparoscopic cholecystectomy

## 2011-07-24 ENCOUNTER — Telehealth: Payer: Self-pay | Admitting: Internal Medicine

## 2011-07-24 NOTE — Telephone Encounter (Signed)
Patient left message asking for samples of Dexilant - she said her insurance is going to change after the first of the year and will pay for this medication.  Called patient and told her I was leaving some samples up front

## 2011-09-09 ENCOUNTER — Telehealth: Payer: Self-pay | Admitting: Internal Medicine

## 2011-09-09 ENCOUNTER — Telehealth: Payer: Self-pay

## 2011-09-09 NOTE — Telephone Encounter (Signed)
informed patient we are out of dexilant samples; told her I would call her as soon as I had some

## 2011-09-15 NOTE — Telephone Encounter (Signed)
CMA OPENED ANOTHER PHONE NOTE. 

## 2011-09-17 ENCOUNTER — Telehealth: Payer: Self-pay | Admitting: Internal Medicine

## 2011-10-22 ENCOUNTER — Encounter: Payer: Self-pay | Admitting: Internal Medicine

## 2011-10-22 ENCOUNTER — Ambulatory Visit (INDEPENDENT_AMBULATORY_CARE_PROVIDER_SITE_OTHER): Payer: Medicare Other | Admitting: Internal Medicine

## 2011-10-22 VITALS — BP 124/68 | HR 60 | Ht 61.0 in | Wt 183.0 lb

## 2011-10-22 DIAGNOSIS — R143 Flatulence: Secondary | ICD-10-CM

## 2011-10-22 DIAGNOSIS — Z800 Family history of malignant neoplasm of digestive organs: Secondary | ICD-10-CM

## 2011-10-22 DIAGNOSIS — K222 Esophageal obstruction: Secondary | ICD-10-CM

## 2011-10-22 DIAGNOSIS — K219 Gastro-esophageal reflux disease without esophagitis: Secondary | ICD-10-CM

## 2011-10-22 DIAGNOSIS — R141 Gas pain: Secondary | ICD-10-CM

## 2011-10-22 DIAGNOSIS — R1084 Generalized abdominal pain: Secondary | ICD-10-CM

## 2011-10-22 DIAGNOSIS — Z86010 Personal history of colonic polyps: Secondary | ICD-10-CM

## 2011-10-22 DIAGNOSIS — R142 Eructation: Secondary | ICD-10-CM

## 2011-10-22 DIAGNOSIS — R1310 Dysphagia, unspecified: Secondary | ICD-10-CM

## 2011-10-22 NOTE — Progress Notes (Signed)
HISTORY OF PRESENT ILLNESS:  Susan Contreras is a 71 y.o. female with multiple general medical problems as listed below. She has been followed in this office over the years for gastroesophageal reflux disease complicated by peptic stricture, abdominal complaints, and adenomatous colon polyps. She presents today with a number of GI complaints as well as a number of non-GI complaints. She is not a particularly good historian. She was last evaluated in the office by the physician assistant 01/15/2011. At that time, she was complaining of nausea with belching and intermittent right upper quadrant discomfort. As well increased reflux symptoms. She was noted the have cholelithiasis. She subsequently underwent laparoscopic cholecystectomy with intraoperative cholangiogram. She has recovered from that. Current complaints are that of intermittent nausea, particularly when she is "swimmy headed". Chills were reported some dysphagia. Abdominal bloating with vague intermittent discomfort. Increased gas. She is interested in upper endoscopy. For her reflux she is on Dexilant 60 mg daily. She denies pyrosis. Her dysphagia is vague. No problems with her bowels or bleeding. Her last colonoscopy was in 2007. She is due for followup. Her sister had colon cancer.  REVIEW OF SYSTEMS:  All non-GI ROS negative except for vertigo, dizziness, sinus allergies, anxiety, headaches, smothering sensation  Past Medical History  Diagnosis Date  . Family hx of colorectal cancer   . Hx of adenomatous colonic polyps   . Anxiety   . Depression   . Hiatal hernia   . Hemorrhoids   . Diverticulosis   . Esophageal stricture   . Hypertension   . GERD (gastroesophageal reflux disease)   . PUD (peptic ulcer disease) 1997    EGD  . Helicobacter pylori (H. pylori) 1997    EGD  . Thyroid disease   . Hyperlipemia     Past Surgical History  Procedure Date  . Abdominal hysterectomy   . Bilateral oophorectomy   . Cholecystectomy      Social History Susan Contreras  reports that she has never smoked. She has never used smokeless tobacco. She reports that she does not drink alcohol or use illicit drugs.  family history includes Colon cancer in her sister.  Allergies  Allergen Reactions  . Biaxin   . Ciprofloxacin   . Flagyl (Metronidazole Hcl)   . Keflex        PHYSICAL EXAMINATION: Vital signs: BP 124/68  Pulse 60  Ht 5\' 1"  (1.549 m)  Wt 183 lb (83.008 kg)  BMI 34.58 kg/m2  Constitutional: generally well-appearing, no acute distress Psychiatric: alert and oriented x3, cooperative Eyes: extraocular movements intact, anicteric, conjunctiva pink Mouth: oral pharynx moist, no lesions Neck: supple no lymphadenopathy Cardiovascular: heart regular rate and rhythm, no murmur Lungs: clear to auscultation bilaterally Abdomen: soft,obese, nontender, nondistended, no obvious ascites, no peritoneal signs, normal bowel sounds, no organomegaly. Prior surgical incision well healed Extremities: no lower extremity edema bilaterally Skin: no lesions on visible extremities Neuro: No focal deficits.    ASSESSMENT:  #1. GERD. Classic symptoms controlled with Dexilant #2. Vague dysphagia. History of peptic stricture. #3. Increased gas and bloating #4. Personal history of adenomatous polyps and family history of colon cancer in first degree relative. Slightly overdue for followup. She is aware that is not interested in having colonoscopy, at least at this time. She understands the risk of underlying neoplasia #5. Multiple non-GI complaints  PLAN:  #1. Upper endoscopy with probable esophageal dilation.The nature of the procedure, as well as the risks, benefits, and alternatives were carefully and thoroughly reviewed with the patient.  Ample time for discussion and questions allowed. The patient understood, was satisfied, and agreed to proceed.  #2. Continue PPI #3. Reflux precautions #4. Anti-gas measures #5. Surveillance  colonoscopy. Patient declines. Encouraged to contact the office as soon as she, if she, changes her mind. #6. See PCP for multiple non-GI complaints

## 2011-10-22 NOTE — Patient Instructions (Signed)
You have been scheduled for an endoscopy with propofol. Please follow written instructions given to you at your visit today.  

## 2011-10-30 NOTE — Telephone Encounter (Signed)
cma opened a new phone note.Marland Kitchen

## 2011-10-31 ENCOUNTER — Institutional Professional Consult (permissible substitution): Payer: Medicare Other | Admitting: Internal Medicine

## 2011-11-07 ENCOUNTER — Telehealth: Payer: Self-pay | Admitting: Internal Medicine

## 2011-11-07 MED ORDER — DEXLANSOPRAZOLE 60 MG PO CPDR: 60.0000 mg | DELAYED_RELEASE_CAPSULE | Freq: Every day | ORAL | Status: DC

## 2011-11-07 NOTE — Telephone Encounter (Signed)
Pt notified that there are some samples and they have been left up front for the pt to pick up.

## 2011-11-26 ENCOUNTER — Telehealth: Payer: Self-pay | Admitting: Internal Medicine

## 2011-11-28 ENCOUNTER — Telehealth: Payer: Self-pay

## 2011-11-28 NOTE — Telephone Encounter (Signed)
let patient know samples of Dexilant would be up front; patient acknlowedged

## 2011-12-12 NOTE — Telephone Encounter (Signed)
See phone note from 11-28-11

## 2011-12-16 ENCOUNTER — Encounter: Payer: Medicare Other | Admitting: Internal Medicine

## 2011-12-22 ENCOUNTER — Other Ambulatory Visit: Payer: Self-pay | Admitting: Internal Medicine

## 2011-12-23 ENCOUNTER — Telehealth: Payer: Self-pay | Admitting: Internal Medicine

## 2011-12-23 NOTE — Telephone Encounter (Signed)
Leaving samples of Dexilant up front

## 2011-12-26 NOTE — Telephone Encounter (Signed)
Samples given to pt 

## 2012-01-20 ENCOUNTER — Telehealth: Payer: Self-pay | Admitting: Internal Medicine

## 2012-01-21 ENCOUNTER — Telehealth: Payer: Self-pay | Admitting: Internal Medicine

## 2012-01-22 NOTE — Telephone Encounter (Signed)
Told patient I had dexilant samples up front for her; she agreed to come pick them up

## 2012-01-23 NOTE — Telephone Encounter (Signed)
CMA opened another phone note. 

## 2012-03-03 ENCOUNTER — Telehealth: Payer: Self-pay | Admitting: Internal Medicine

## 2012-03-04 NOTE — Telephone Encounter (Signed)
Called Ms. Marsan and told her that I was out of the office but that I would have someone from the GI office put some Dexilant samples up front for her.  Patient acknowledged

## 2012-03-22 ENCOUNTER — Telehealth: Payer: Self-pay | Admitting: *Deleted

## 2012-03-23 ENCOUNTER — Telehealth: Payer: Self-pay | Admitting: Internal Medicine

## 2012-03-23 MED ORDER — DEXLANSOPRAZOLE 60 MG PO CPDR: 60.0000 mg | DELAYED_RELEASE_CAPSULE | Freq: Every day | ORAL | Status: DC

## 2012-03-23 NOTE — Telephone Encounter (Signed)
Samples left up front for pt to pick up, pt aware.

## 2012-03-24 NOTE — Telephone Encounter (Signed)
told pt I would leave a few samples up front

## 2012-05-03 ENCOUNTER — Telehealth: Payer: Self-pay | Admitting: Internal Medicine

## 2012-05-04 MED ORDER — DEXLANSOPRAZOLE 60 MG PO CPDR: 60.0000 mg | DELAYED_RELEASE_CAPSULE | Freq: Every day | ORAL | Status: DC

## 2012-05-04 NOTE — Telephone Encounter (Signed)
Pt called wanting Dexilant samples. Samples left up front for pick up and pt aware.

## 2012-05-20 ENCOUNTER — Encounter: Payer: Self-pay | Admitting: Internal Medicine

## 2012-05-27 ENCOUNTER — Other Ambulatory Visit: Payer: Self-pay | Admitting: Internal Medicine

## 2012-05-27 NOTE — Telephone Encounter (Signed)
Spoke to Patient and told her I would leave samples of Dexilant up front for her.  Patient agreed to come pick them up

## 2012-06-18 ENCOUNTER — Other Ambulatory Visit: Payer: Self-pay | Admitting: Internal Medicine

## 2012-06-21 NOTE — Telephone Encounter (Signed)
Let Patient know I would leave Dexilant samples up front for her

## 2012-07-08 ENCOUNTER — Other Ambulatory Visit: Payer: Self-pay | Admitting: Internal Medicine

## 2012-07-09 NOTE — Telephone Encounter (Signed)
Called pt and told her I would leave some Dexilant samples up front for her.  I also told her I would leave a Dexilant savings card for her to try to see if she could get a discount.  Patient acknowledged and agreed.

## 2012-08-12 ENCOUNTER — Telehealth: Payer: Self-pay | Admitting: Internal Medicine

## 2012-08-16 MED ORDER — DEXLANSOPRAZOLE 60 MG PO CPDR: 60.0000 mg | DELAYED_RELEASE_CAPSULE | Freq: Every day | ORAL | Status: DC

## 2012-08-16 NOTE — Telephone Encounter (Signed)
I called patient and advised her that I will leave samples up front for her but we do not have as much because we are not getting samples like we use to.  I asked patient why can I not send a prescription and patient stated that her copay is $100 and she has not had the money and each month she was allowed to get samples.  Patient stated that she was given a discount card for Dexilant, I advised patient that discount cards are not honored by [employer]. ______________________________________________________________________________________________________________________  Patient stated that she tried all the PPI's and only Dexilant works and she understands that eventually she will have to break down and pay for it because Medicare will not cover Dexilant. I advised patient that I will call Medicare and see what they say and call her back.   (Patient's claim for Dexilant was last done and approved until 2012 and new claim was never done after that). Patients supplement insurance at that time is now not active. _________________________________________________________________________________________________________  I called Medicare at [phone] and spoke with Leotis Shames and he said I need to call [phone]---[employer].  Per Bethann Berkshire from [place] I have to call Medicare back.  Per Guy Sandifer I need to call [phone]--[employer]  Per April have to contact patients supplemental drug plan if she has one and see if they will pay because Medicare will not pay for Dexilant.  Tried to contact patient back to see if she has a new drug supplement plan that I could call, no answer. Left message on patients voice mail for call back.   Samples left out front

## 2012-08-17 ENCOUNTER — Other Ambulatory Visit: Payer: Self-pay | Admitting: Internal Medicine

## 2012-08-20 ENCOUNTER — Telehealth: Payer: Self-pay | Admitting: Internal Medicine

## 2012-08-23 NOTE — Telephone Encounter (Signed)
Told patient I put some Dexilant samples up front; discussed alternatives - I told pt I can't give her ongoing samples of Dexilant every month. Pt stated she has some Protonix refills available that are less expensive.  She is going to get that filled and try that for a while

## 2012-09-21 ENCOUNTER — Telehealth: Payer: Self-pay | Admitting: Internal Medicine

## 2012-09-22 NOTE — Telephone Encounter (Signed)
Spoke with pt and told her I would leave a few samples but also suggested she check into a program called [employer], which helps get people rx's for a discount, which has been very successful for other people on her rx.  She agreed to contact them.

## 2012-09-24 ENCOUNTER — Telehealth: Payer: Self-pay | Admitting: Internal Medicine

## 2012-09-24 MED ORDER — HYDROCORTISONE ACETATE 25 MG RE SUPP: 25.0000 mg | Freq: Two times a day (BID) | RECTAL | Status: DC

## 2012-09-24 NOTE — Telephone Encounter (Signed)
Left message for Susan Contreras to call back.  Susan Contreras called and states that her hemorrhoids are bothering her, she is requesting that something be called in for them. Susan Contreras has had anusol supp in the past. Dr. Juanda Chance as doc of the day please advise.

## 2012-09-24 NOTE — Telephone Encounter (Signed)
I agree with sending Anusol HC supp. # 12, 1 hs, 1 refill

## 2012-09-24 NOTE — Telephone Encounter (Signed)
Script sent to pharmacy for pt. Annusol approved by Willette Cluster NP.

## 2012-11-10 ENCOUNTER — Telehealth: Payer: Self-pay | Admitting: Internal Medicine

## 2012-12-27 NOTE — Telephone Encounter (Signed)
Samples given.  

## 2012-12-28 ENCOUNTER — Telehealth: Payer: Self-pay | Admitting: Internal Medicine

## 2012-12-28 NOTE — Telephone Encounter (Signed)
Patient would like to know if we have any samples of dexilant

## 2012-12-28 NOTE — Telephone Encounter (Signed)
LM on VM.

## 2013-01-13 ENCOUNTER — Other Ambulatory Visit: Payer: Self-pay

## 2013-01-13 MED ORDER — IRBESARTAN 150 MG PO TABS: 150.0000 mg | ORAL_TABLET | Freq: Every day | ORAL | Status: DC

## 2013-01-13 MED ORDER — LEVOTHYROXINE SODIUM 50 MCG PO TABS: 50.0000 ug | ORAL_TABLET | Freq: Every day | ORAL | Status: DC

## 2013-01-19 ENCOUNTER — Telehealth: Payer: Self-pay | Admitting: Internal Medicine

## 2013-01-20 ENCOUNTER — Telehealth: Payer: Self-pay | Admitting: Internal Medicine

## 2013-01-25 NOTE — Telephone Encounter (Signed)
Samples given.  

## 2013-02-14 ENCOUNTER — Telehealth: Payer: Self-pay | Admitting: Internal Medicine

## 2013-02-23 ENCOUNTER — Other Ambulatory Visit (INDEPENDENT_AMBULATORY_CARE_PROVIDER_SITE_OTHER): Payer: Medicare Other

## 2013-02-23 DIAGNOSIS — E039 Hypothyroidism, unspecified: Secondary | ICD-10-CM

## 2013-02-23 LAB — COMPREHENSIVE METABOLIC PANEL
ALT: 12 U/L (ref 0–35)
AST: 18 U/L (ref 0–37)
Alkaline Phosphatase: 85 U/L (ref 39–117)
Sodium: 136 mEq/L (ref 135–145)
Total Bilirubin: 0.6 mg/dL (ref 0.3–1.2)
Total Protein: 8.1 g/dL (ref 6.0–8.3)

## 2013-02-23 LAB — THYROID PANEL WITH TSH
Free Thyroxine Index: 2.6 (ref 1.0–3.9)
TSH: 4.342 u[IU]/mL (ref 0.350–4.500)

## 2013-02-24 ENCOUNTER — Other Ambulatory Visit: Payer: Self-pay | Admitting: Family Medicine

## 2013-02-24 LAB — NMR LIPOPROFILE WITH LIPIDS
Cholesterol, Total: 291 mg/dL — ABNORMAL HIGH (ref <200)
HDL Particle Number: 28.3 umol/L — ABNORMAL LOW (ref >=30.5)
LP-IR Score: 99 — ABNORMAL HIGH (ref <=45)
Large VLDL-P: >50 nmol/L — ABNORMAL HIGH (ref <=2.7)
Small LDL Particle Number: 1243 nmol/L — ABNORMAL HIGH (ref <=527)

## 2013-02-24 MED ORDER — LEVOTHYROXINE SODIUM 50 MCG PO TABS: 50.0000 ug | ORAL_TABLET | Freq: Every day | ORAL | Status: DC

## 2013-02-24 NOTE — Telephone Encounter (Signed)
Patient had labs done on 7-2 that you requested her to do before her next refill. Please review and adjust meds if needed

## 2013-03-01 ENCOUNTER — Ambulatory Visit: Payer: Medicare Other | Admitting: General Practice

## 2013-03-08 ENCOUNTER — Telehealth: Payer: Self-pay | Admitting: Internal Medicine

## 2013-03-18 NOTE — Telephone Encounter (Signed)
Gave patient samples 

## 2013-03-18 NOTE — Telephone Encounter (Signed)
Gave samples

## 2013-03-26 ENCOUNTER — Other Ambulatory Visit: Payer: Self-pay | Admitting: Family Medicine

## 2013-03-28 ENCOUNTER — Telehealth: Payer: Self-pay | Admitting: Internal Medicine

## 2013-03-29 ENCOUNTER — Other Ambulatory Visit: Payer: Self-pay | Admitting: *Deleted

## 2013-03-29 MED ORDER — LEVOTHYROXINE SODIUM 50 MCG PO TABS: 50.0000 ug | ORAL_TABLET | Freq: Every day | ORAL | Status: DC

## 2013-03-29 NOTE — Telephone Encounter (Signed)
Patient notified at last refill that NTBS before next refill. Please advise

## 2013-03-29 NOTE — Telephone Encounter (Signed)
Called patient and then left samples up front

## 2013-04-14 ENCOUNTER — Telehealth: Payer: Self-pay | Admitting: Internal Medicine

## 2013-04-14 NOTE — Telephone Encounter (Signed)
Gave samples of Dexilant

## 2013-04-28 ENCOUNTER — Telehealth: Payer: Self-pay | Admitting: Nurse Practitioner

## 2013-04-28 MED ORDER — LEVOTHYROXINE SODIUM 50 MCG PO TABS: 50.0000 ug | ORAL_TABLET | Freq: Every day | ORAL | Status: DC

## 2013-04-28 NOTE — Telephone Encounter (Signed)
x

## 2013-05-03 ENCOUNTER — Telehealth: Payer: Self-pay | Admitting: Internal Medicine

## 2013-05-04 NOTE — Telephone Encounter (Signed)
Left Dexilant samples up front. 

## 2013-05-08 LAB — HM MAMMOGRAPHY

## 2013-05-25 ENCOUNTER — Telehealth: Payer: Self-pay | Admitting: Internal Medicine

## 2013-05-27 ENCOUNTER — Telehealth: Payer: Self-pay | Admitting: Internal Medicine

## 2013-05-27 NOTE — Telephone Encounter (Signed)
Called last PM approx 10 PM Ate a taco, lied down. Awakened with burning hot fluid regurgitation and even burning in ears. Tums not relieving.  She did not have dyspnea or sweats. Advised liquid antacid and/or Pepto bismol.

## 2013-05-30 ENCOUNTER — Telehealth: Payer: Self-pay | Admitting: Nurse Practitioner

## 2013-05-31 ENCOUNTER — Other Ambulatory Visit: Payer: Self-pay

## 2013-05-31 MED ORDER — IRBESARTAN 150 MG PO TABS: 150.0000 mg | ORAL_TABLET | Freq: Every day | ORAL | Status: DC

## 2013-05-31 NOTE — Telephone Encounter (Signed)
Last seen 11/05/12  MMM

## 2013-06-13 ENCOUNTER — Telehealth: Payer: Self-pay | Admitting: Internal Medicine

## 2013-06-14 ENCOUNTER — Telehealth: Payer: Self-pay | Admitting: Internal Medicine

## 2013-06-14 NOTE — Telephone Encounter (Signed)
Spoke to pt. Told her I will leave her samples of Dexilant. Pt verbalized understanding

## 2013-06-14 NOTE — Telephone Encounter (Signed)
Called pt to inform samples are out front

## 2013-06-23 ENCOUNTER — Encounter: Payer: Self-pay | Admitting: General Practice

## 2013-06-23 ENCOUNTER — Ambulatory Visit (INDEPENDENT_AMBULATORY_CARE_PROVIDER_SITE_OTHER): Payer: Medicare Other | Admitting: General Practice

## 2013-06-23 VITALS — BP 117/65 | HR 77 | Temp 97.0°F | Ht 61.0 in | Wt 188.5 lb

## 2013-06-23 DIAGNOSIS — K219 Gastro-esophageal reflux disease without esophagitis: Secondary | ICD-10-CM

## 2013-06-23 DIAGNOSIS — I10 Essential (primary) hypertension: Secondary | ICD-10-CM

## 2013-06-23 DIAGNOSIS — E039 Hypothyroidism, unspecified: Secondary | ICD-10-CM

## 2013-06-23 MED ORDER — LEVOTHYROXINE SODIUM 50 MCG PO TABS: 50.0000 ug | ORAL_TABLET | Freq: Every day | ORAL | Status: DC

## 2013-06-23 MED ORDER — IRBESARTAN 150 MG PO TABS: 150.0000 mg | ORAL_TABLET | Freq: Every day | ORAL | Status: DC

## 2013-06-23 NOTE — Progress Notes (Signed)
  Subjective:    Patient ID: Susan Contreras, female    DOB: 01/21/1941, 72 y.o.   MRN: [id number]  HPI Patient presents today for chronic health check up. She has a history of hypertension, GERD and hyperthyroidism. She denies taking medications as prescribed. Reports eating a regular diet and denies regular exercise. Denies any questions or concerns. She reports that she didn't plan on having any labs drawn today and only wants a refill on her medications.     Review of Systems  Constitutional: Negative for fever and chills.  Respiratory: Negative for cough, chest tightness, shortness of breath and wheezing.   Cardiovascular: Negative for chest pain and palpitations.  Gastrointestinal: Negative for abdominal pain, diarrhea, constipation and blood in stool.  Genitourinary: Negative for dysuria, hematuria and difficulty urinating.  Musculoskeletal: Negative for back pain, neck pain and neck stiffness.  Neurological: Negative for dizziness, weakness and headaches.       Objective:   Physical Exam  Constitutional: She is oriented to person, place, and time. She appears well-developed and well-nourished.  HENT:  Head: Normocephalic and atraumatic.  Right Ear: External ear normal.  Left Ear: External ear normal.  Nose: Nose normal.  Mouth/Throat: Oropharynx is clear and moist.  Eyes: EOM are normal. Pupils are equal, round, and reactive to light.  Neck: Normal range of motion. Neck supple. No thyromegaly present.  Cardiovascular: Normal rate, regular rhythm and normal heart sounds.   Pulmonary/Chest: Effort normal and breath sounds normal. No respiratory distress. She exhibits no tenderness.  Abdominal: Soft. Bowel sounds are normal. She exhibits no distension. There is no tenderness.  Musculoskeletal: She exhibits no edema and no tenderness.  Lymphadenopathy:    She has no cervical adenopathy.  Neurological: She is alert and oriented to person, place, and time.  Skin: Skin is warm and  dry.  Psychiatric: She has a normal mood and affect.          Assessment & Plan:  1. Hypertension  - CMP14+EGFR - irbesartan (AVAPRO) 150 MG tablet; Take 1 tablet (150 mg total) by mouth at bedtime.  Dispense: 30 tablet; Refill: 3  2. GERD (gastroesophageal reflux disease)   3. Hypothyroidism  - levothyroxine (SYNTHROID, LEVOTHROID) 50 MCG tablet; Take 1 tablet (50 mcg total) by mouth daily.  Dispense: 30 tablet; Refill: 3 -discussed importance of having labs drawn to more appropriately manage conditions -informed patient that creatinine was elevated based on last lab results -discussed taking medications as directed -Patient verbalized that she would have lab to evaluate kidney function, but declined other -Patient verbalized she wouldn't take lipid lower medication -Discussed risks of elevated LDL -Patient verbalized understanding  -Coralie Keens, FNP-C

## 2013-06-23 NOTE — Patient Instructions (Signed)

## 2013-06-24 LAB — CMP14+EGFR
AST: 17 IU/L (ref 0–40)
BUN: 11 mg/dL (ref 8–27)
CO2: 24 mmol/L (ref 18–29)
Calcium: 9.6 mg/dL (ref 8.6–10.2)
Creatinine, Ser: 1.03 mg/dL — ABNORMAL HIGH (ref 0.57–1.00)
Globulin, Total: 3 g/dL (ref 1.5–4.5)
Sodium: 140 mmol/L (ref 134–144)

## 2013-06-30 NOTE — Telephone Encounter (Signed)
See Previous Encounter

## 2013-07-11 ENCOUNTER — Telehealth: Payer: Self-pay | Admitting: Internal Medicine

## 2013-07-15 NOTE — Telephone Encounter (Signed)
Put samples up front 

## 2013-08-22 ENCOUNTER — Telehealth: Payer: Self-pay | Admitting: Internal Medicine

## 2013-08-23 NOTE — Telephone Encounter (Signed)
Spoke with patient and told her I only had a few boxes of Dexilant, which I would leave up front for her.  Patient agreed.

## 2013-08-31 ENCOUNTER — Other Ambulatory Visit: Payer: Self-pay | Admitting: Internal Medicine

## 2013-08-31 NOTE — Telephone Encounter (Signed)
Gave pt samples of Dexilant

## 2013-09-01 ENCOUNTER — Other Ambulatory Visit: Payer: Self-pay | Admitting: Internal Medicine

## 2013-09-21 NOTE — Telephone Encounter (Signed)
Gave pt samples of Dexilant  

## 2013-10-24 ENCOUNTER — Telehealth: Payer: Self-pay | Admitting: Internal Medicine

## 2013-10-24 NOTE — Telephone Encounter (Signed)
I have advised patient that we do not have Dexilant samples at this time. I have also advised that she needs an office visit with Dr Henrene Pastor as she has not been seen since 09/2011 (and it appears she was to have endoscopy at that time but never did). She has scheduled an appointment with Dr Henrene Pastor on 11/29/13 @ 10:45 for routine GERD follow up.

## 2013-11-15 ENCOUNTER — Other Ambulatory Visit: Payer: Self-pay | Admitting: *Deleted

## 2013-11-15 ENCOUNTER — Telehealth: Payer: Self-pay | Admitting: General Practice

## 2013-11-15 DIAGNOSIS — E039 Hypothyroidism, unspecified: Secondary | ICD-10-CM

## 2013-11-15 DIAGNOSIS — I10 Essential (primary) hypertension: Secondary | ICD-10-CM

## 2013-11-15 NOTE — Telephone Encounter (Signed)
Patient thought she had a sinus infection but was at dentist and they are treating her for abcess tooth with antibiotic so she is good right now

## 2013-11-16 ENCOUNTER — Telehealth: Payer: Self-pay | Admitting: General Practice

## 2013-11-16 DIAGNOSIS — E039 Hypothyroidism, unspecified: Secondary | ICD-10-CM

## 2013-11-16 DIAGNOSIS — I10 Essential (primary) hypertension: Secondary | ICD-10-CM

## 2013-11-16 MED ORDER — LEVOTHYROXINE SODIUM 50 MCG PO TABS: 50.0000 ug | ORAL_TABLET | Freq: Every day | ORAL | Status: DC

## 2013-11-16 MED ORDER — IRBESARTAN 150 MG PO TABS: 150.0000 mg | ORAL_TABLET | Freq: Every day | ORAL | Status: DC

## 2013-11-16 NOTE — Telephone Encounter (Signed)
I can't tell that Synthroid was refilled either. One refill sent in for Synthroid and Avapro. Patient will need to be see before additional refills will be given. She was agreeable to this.

## 2013-11-29 ENCOUNTER — Encounter: Payer: Self-pay | Admitting: Internal Medicine

## 2013-11-29 ENCOUNTER — Ambulatory Visit (INDEPENDENT_AMBULATORY_CARE_PROVIDER_SITE_OTHER): Payer: Commercial Managed Care - HMO | Admitting: Internal Medicine

## 2013-11-29 VITALS — BP 120/72 | HR 72 | Ht 61.0 in | Wt 190.2 lb

## 2013-11-29 DIAGNOSIS — K222 Esophageal obstruction: Secondary | ICD-10-CM | POA: Diagnosis not present

## 2013-11-29 DIAGNOSIS — Z860100 Personal history of colon polyps, unspecified: Secondary | ICD-10-CM | POA: Diagnosis not present

## 2013-11-29 DIAGNOSIS — K219 Gastro-esophageal reflux disease without esophagitis: Secondary | ICD-10-CM | POA: Diagnosis not present

## 2013-11-29 DIAGNOSIS — Z800 Family history of malignant neoplasm of digestive organs: Secondary | ICD-10-CM | POA: Diagnosis not present

## 2013-11-29 MED ORDER — DEXLANSOPRAZOLE 60 MG PO CPDR: 60.0000 mg | DELAYED_RELEASE_CAPSULE | Freq: Every day | ORAL | Status: DC

## 2013-11-29 MED ORDER — OMEPRAZOLE 40 MG PO CPDR: 40.0000 mg | DELAYED_RELEASE_CAPSULE | Freq: Every day | ORAL | Status: DC

## 2013-11-29 NOTE — Progress Notes (Signed)
HISTORY OF PRESENT ILLNESS:  Susan Contreras is a 73 y.o. female with a history of GERD competent by peptic stricture, personal history of adenomatous colon polyps, and a family history of colon cancer in her sister. Previously under the care of Dr. Lyla Son until transferring to myself. I last saw her February 2013. At that time she was on Dexilant (repeatedly get samples from the office) and complained of vague dysphagia. Upper endoscopy was recommended. She did not proceed. We also discussed surveillance colonoscopy. She was not interested. Currently states a Dexilant controls her reflux symptoms. Request alternative PPI that is less expensive. No dysphagia. No lower GI complaints. We did discuss colonoscopy, once again.  REVIEW OF SYSTEMS:  All non-GI ROS negative except for toothache  Past Medical History  Diagnosis Date  . Family hx of colorectal cancer   . Hx of adenomatous colonic polyps   . Anxiety   . Depression   . Hiatal hernia   . Hemorrhoids   . Diverticulosis   . Esophageal stricture   . Hypertension   . GERD (gastroesophageal reflux disease)   . PUD (peptic ulcer disease) 1997    EGD  . Helicobacter pylori (H. pylori) 1997    EGD  . Thyroid disease   . Hyperlipemia     Past Surgical History  Procedure Laterality Date  . Abdominal hysterectomy    . Bilateral oophorectomy    . Cholecystectomy      Social History Susan Contreras  reports that she has never smoked. She has never used smokeless tobacco. She reports that she does not drink alcohol or use illicit drugs.  family history includes Colon cancer in her sister.  Allergies  Allergen Reactions  . Biaxin [Clarithromycin]   . Cephalexin   . Ciprofloxacin   . Doxycycline     Headaches  . Flagyl [Metronidazole Hcl]   . Ketek [Telithromycin]   . Sulfa Antibiotics        PHYSICAL EXAMINATION: Vital signs: BP 120/72  Pulse 72  Ht 5\' 1"  (1.549 m)  Wt 190 lb 3.2 oz (86.274 kg)  BMI 35.96  kg/m2 General: Well-developed, well-nourished, no acute distress HEENT: Sclerae are anicteric, conjunctiva pink. Oral mucosa intact Lungs: Clear Heart: Regular Abdomen: soft, nontender, nondistended, no obvious ascites, no peritoneal signs, normal bowel sounds. No organomegaly. Extremities: No edema Psychiatric: alert and oriented x3. Cooperative   ASSESSMENT:  #1. GERD complicated by peptic stricture. Currently asymptomatic when taking PPI #2. Personal history of adenomatous polyps. Family history of colon cancer in first-degree relative. Last colonoscopy 2007 negative   PLAN:  #1. Reflux precautions #2. 20 days a Dexilant samples #3. Prescribe omeprazole 40 mg daily. #4. Patient encouraged to proceed with surveillance colonoscopy. She understands the importance. Had some concerns regarding the prep. We discussed this as well.

## 2013-11-29 NOTE — Patient Instructions (Signed)
We have sent the following medications to your pharmacy for you to pick up at your convenience:  Omeprazole  You have been given some samples of Dexilant

## 2013-12-19 ENCOUNTER — Telehealth: Payer: Self-pay | Admitting: General Practice

## 2013-12-19 DIAGNOSIS — I10 Essential (primary) hypertension: Secondary | ICD-10-CM

## 2013-12-19 DIAGNOSIS — E039 Hypothyroidism, unspecified: Secondary | ICD-10-CM

## 2013-12-20 MED ORDER — LEVOTHYROXINE SODIUM 50 MCG PO TABS: 50.0000 ug | ORAL_TABLET | Freq: Every day | ORAL | Status: DC

## 2013-12-20 MED ORDER — IRBESARTAN 150 MG PO TABS: 150.0000 mg | ORAL_TABLET | Freq: Every day | ORAL | Status: DC

## 2013-12-20 NOTE — Telephone Encounter (Signed)
Done per [facility]

## 2014-01-04 ENCOUNTER — Encounter: Payer: Self-pay | Admitting: Physician Assistant

## 2014-01-04 ENCOUNTER — Ambulatory Visit (INDEPENDENT_AMBULATORY_CARE_PROVIDER_SITE_OTHER): Payer: Commercial Managed Care - HMO | Admitting: Physician Assistant

## 2014-01-04 VITALS — BP 105/64 | HR 75 | Temp 98.0°F | Ht 61.0 in | Wt 188.0 lb

## 2014-01-04 DIAGNOSIS — E039 Hypothyroidism, unspecified: Secondary | ICD-10-CM

## 2014-01-04 DIAGNOSIS — I10 Essential (primary) hypertension: Secondary | ICD-10-CM

## 2014-01-04 DIAGNOSIS — H8110 Benign paroxysmal vertigo, unspecified ear: Secondary | ICD-10-CM

## 2014-01-04 MED ORDER — LEVOTHYROXINE SODIUM 50 MCG PO TABS: 50.0000 ug | ORAL_TABLET | Freq: Every day | ORAL | Status: DC

## 2014-01-04 MED ORDER — IRBESARTAN 150 MG PO TABS: 150.0000 mg | ORAL_TABLET | Freq: Every day | ORAL | Status: DC

## 2014-01-04 MED ORDER — MECLIZINE HCL 25 MG PO TABS: 25.0000 mg | ORAL_TABLET | Freq: Three times a day (TID) | ORAL | Status: DC | PRN

## 2014-01-04 NOTE — Progress Notes (Signed)
Subjective:     Patient ID: Susan Contreras, female   DOB: [birth year], 73 y.o.   MRN: [id number]  HPI Pt here for f/u of her HTN and hypothyroidism Pt with recent tooth abscess and currently on Amox from her dentist Pt with a hx of vertigo and would like a rx of Antivert to keep on hand  Review of Systems She denies any chest pain, SOB, or lower ext edema No change noted in stamina She denies any wgt gain/loss She does not note any heat/cold intolerance No noted hair loss or tremor    Objective:   Physical Exam Vitals- reviewed No JVD/Bruits Thyroid- non palp Heart- RRR w/o M Lungs- CTA bilat Pulses equal int he upper ext No lower ext edema0   Assessment:     HTN Hypothyroidism    Plan:     BMP and thyroid panel done today Cont current doses Rf of meds x 6 months done today F/U in 6 months

## 2014-01-05 LAB — BMP8+EGFR
BUN/Creatinine Ratio: 8 — ABNORMAL LOW (ref 11–26)
BUN: 10 mg/dL (ref 8–27)
CHLORIDE: 101 mmol/L (ref 97–108)
CO2: 20 mmol/L (ref 18–29)
Calcium: 9.4 mg/dL (ref 8.7–10.3)
Creatinine, Ser: 1.31 mg/dL — ABNORMAL HIGH (ref 0.57–1.00)
GFR calc non Af Amer: 41 mL/min/{1.73_m2} — ABNORMAL LOW (ref >59)
GFR, EST AFRICAN AMERICAN: 48 mL/min/{1.73_m2} — AB (ref >59)
GLUCOSE: 101 mg/dL — AB (ref 65–99)
POTASSIUM: 4.4 mmol/L (ref 3.5–5.2)
SODIUM: 136 mmol/L (ref 134–144)

## 2014-01-05 LAB — THYROID PANEL WITH TSH
FREE THYROXINE INDEX: 1.9 (ref 1.2–4.9)
T3 Uptake Ratio: 26 % (ref 24–39)
T4, Total: 7.3 ug/dL (ref 4.5–12.0)
TSH: 2.97 u[IU]/mL (ref 0.450–4.500)

## 2014-01-09 ENCOUNTER — Telehealth: Payer: Self-pay | Admitting: Family Medicine

## 2014-01-09 NOTE — Telephone Encounter (Signed)
Message copied by Waverly Ferrari on Mon Jan 09, 2014  4:34 PM ------      Message from: Lodema Pilot      Created: Mon Jan 09, 2014  2:52 PM       Pls inform thyroid level ok      Her kidney function is sl decreased      I would like her to cont her current meds      Hydrate well and reck renal function in 3 mos ------

## 2014-01-10 NOTE — Telephone Encounter (Signed)
Please review

## 2014-01-10 NOTE — Telephone Encounter (Signed)
Patient wants to know why her cholesterol was not checked it is checked every time she comes in here

## 2014-01-10 NOTE — Telephone Encounter (Signed)
Please address

## 2014-01-10 NOTE — Telephone Encounter (Signed)
Inform rx was rf to her pharmacy of choice Lipid panel was not done due to the fact she is not on lipid meds We can add on to her current labs if she would like

## 2014-01-11 NOTE — Telephone Encounter (Signed)
Patient aware to late to add labs

## 2014-01-13 ENCOUNTER — Telehealth: Payer: Self-pay | Admitting: Internal Medicine

## 2014-01-13 MED ORDER — DEXLANSOPRAZOLE 60 MG PO CPDR: 60.0000 mg | DELAYED_RELEASE_CAPSULE | Freq: Every day | ORAL | Status: DC

## 2014-01-13 NOTE — Telephone Encounter (Signed)
Sent Dexilant per patient's request 

## 2014-01-27 ENCOUNTER — Telehealth: Payer: Self-pay | Admitting: Internal Medicine

## 2014-01-27 NOTE — Telephone Encounter (Signed)
Spoke with patient and let her know I would leave some samples of Dexilant up front for her.  Warned her that we are going through some changes regarding samples and may not able to give any out down the road.  Patient agreed

## 2014-02-01 ENCOUNTER — Telehealth: Payer: Self-pay | Admitting: Internal Medicine

## 2014-02-01 NOTE — Telephone Encounter (Signed)
Pt states she had a loose watery dark stool today and she feels weak. Discussed with pt that we should probably see her that she may have some blood in her stool if it is dark. Pt states she cannot come today. Pt states she will call her PCP because they are closer to her. States she will call us back if she needs to be seen here.

## 2014-02-11 ENCOUNTER — Encounter: Payer: Commercial Managed Care - HMO | Admitting: Nurse Practitioner

## 2014-02-11 NOTE — Progress Notes (Signed)
   Subjective:    Patient ID: Susan Contreras, female    DOB: 10/04/1940, 73 y.o.   MRN: [id number]  HPI    Review of Systems     Objective:   Physical Exam        Assessment & Plan:  patienet decided not to be seen because we were unable to do lab work- wanted to go to urgent care for labs. She was suppose to be seen last week for fatigue bit said that she felt better so she decided not to come in and be seen then.

## 2014-02-21 ENCOUNTER — Telehealth: Payer: Self-pay | Admitting: Internal Medicine

## 2014-02-27 ENCOUNTER — Telehealth: Payer: Self-pay | Admitting: Internal Medicine

## 2014-02-27 NOTE — Telephone Encounter (Signed)
Told patient I would leave some Dexilant samples up front.

## 2014-02-27 NOTE — Telephone Encounter (Signed)
Left Dexilant samples up front.

## 2014-03-08 ENCOUNTER — Telehealth: Payer: Self-pay

## 2014-03-08 NOTE — Telephone Encounter (Signed)
Spoke with patient to give her some advance notice we would no longer be getting any Dexilant samples.  I told her I would put some samples up front so she would have one more month of samples before we ran out.  Patient acknowledged and understood.

## 2014-04-25 ENCOUNTER — Ambulatory Visit (INDEPENDENT_AMBULATORY_CARE_PROVIDER_SITE_OTHER): Payer: Commercial Managed Care - HMO | Admitting: Family Medicine

## 2014-04-25 ENCOUNTER — Encounter: Payer: Self-pay | Admitting: Family Medicine

## 2014-04-25 VITALS — BP 138/66 | HR 67 | Temp 96.8°F | Ht 61.0 in | Wt 188.0 lb

## 2014-04-25 DIAGNOSIS — J018 Other acute sinusitis: Secondary | ICD-10-CM

## 2014-04-25 MED ORDER — AMOXICILLIN 500 MG PO CAPS: 500.0000 mg | ORAL_CAPSULE | Freq: Three times a day (TID) | ORAL | Status: DC

## 2014-04-25 NOTE — Progress Notes (Signed)
   Subjective:    Patient ID: Susan Contreras, female    DOB: [birth year], 73 y.o.   MRN: [id number]  HPI This 73 y.o. female presents for evaluation of sinus pressure and uri sx's.   Review of Systems C/o uri sx's No chest pain, SOB, HA, dizziness, vision change, N/V, diarrhea, constipation, dysuria, urinary urgency or frequency, myalgias, arthralgias or rash.     Objective:   Physical Exam  Vital signs noted  Well developed well nourished female.  HEENT - Head atraumatic Normocephalic                Eyes - PERRLA, Conjuctiva - clear Sclera- Clear EOMI                Ears - EAC's Wnl TM's Wnl Gross Hearing WNL                Nose - Nares decreased patency                 Throat - oropharanx wnl Respiratory - Lungs CTA bilateral Cardiac - RRR S1 and S2 without murmur GI - Abdomen soft Nontender and bowel sounds active x 4 Extremities - No edema. Neuro - Grossly intact.      Assessment & Plan:  Other acute sinusitis - Plan: amoxicillin (AMOXIL) 500 MG capsule po tid x 2 weeks Push po fluids, rest, tylenol and motrin otc prn as directed for fever, arthralgias, and myalgias.  Follow up prn if sx's continue or persist.  Lysbeth Penner FNP

## 2014-05-16 ENCOUNTER — Telehealth: Payer: Self-pay | Admitting: Internal Medicine

## 2014-05-16 NOTE — Telephone Encounter (Signed)
Pt states she has been having abdominal pain where her gallbladder used to be and she is having difficulty swallowing. Pt scheduled to see Amy Esterwood PA 05/25/14@2pm . Pt aware of appt.

## 2014-05-25 ENCOUNTER — Ambulatory Visit (INDEPENDENT_AMBULATORY_CARE_PROVIDER_SITE_OTHER): Payer: Commercial Managed Care - HMO | Admitting: Physician Assistant

## 2014-05-25 ENCOUNTER — Encounter: Payer: Self-pay | Admitting: Physician Assistant

## 2014-05-25 ENCOUNTER — Other Ambulatory Visit (INDEPENDENT_AMBULATORY_CARE_PROVIDER_SITE_OTHER): Payer: Commercial Managed Care - HMO

## 2014-05-25 VITALS — BP 110/70 | HR 76 | Ht 61.0 in | Wt 189.4 lb

## 2014-05-25 DIAGNOSIS — R1314 Dysphagia, pharyngoesophageal phase: Secondary | ICD-10-CM

## 2014-05-25 DIAGNOSIS — Z800 Family history of malignant neoplasm of digestive organs: Secondary | ICD-10-CM

## 2014-05-25 DIAGNOSIS — R1011 Right upper quadrant pain: Secondary | ICD-10-CM

## 2014-05-25 LAB — HEPATIC FUNCTION PANEL
ALT: 17 U/L (ref 0–35)
AST: 22 U/L (ref 0–37)
Albumin: 4.2 g/dL (ref 3.5–5.2)
Alkaline Phosphatase: 84 U/L (ref 39–117)
Bilirubin, Direct: 0.1 mg/dL (ref 0.0–0.3)
TOTAL PROTEIN: 8.6 g/dL — AB (ref 6.0–8.3)
Total Bilirubin: 0.4 mg/dL (ref 0.2–1.2)

## 2014-05-25 MED ORDER — MOVIPREP 100 G PO SOLR: 1.0000 | ORAL | Status: DC

## 2014-05-25 NOTE — Patient Instructions (Addendum)
You have been scheduled for an endoscopy and colonoscopy. Please follow the written instructions given to you at your visit today. Please pick up your prep at the pharmacy within the next 1-3 days. If you use inhalers (even only as needed), please bring them with you on the day of your procedure. Your physician has requested that you go to www.startemmi.com and enter the access code given to you at your visit today. This web site gives a general overview about your procedure. However, you should still follow specific instructions given to you by our office regarding your preparation for the procedure.  You have been scheduled for an abdominal ultrasound at [facility] (1st floor of hospital) on Tuesday 06/30/14 at 10:00 am. Please arrive 15 minutes prior to your appointment for registration. Make certain not to have anything to eat or drink 6 hours prior to your appointment. Should you need to reschedule your appointment, please contact radiology at [phone]. This test typically takes about 30 minutes to perform.  Your physician has requested that you go to the basement for the following lab work before leaving today: Hepatic Panel  Stop the [place] and take Omeprazole 40 mg twice daily.

## 2014-05-25 NOTE — Progress Notes (Signed)
Subjective:    Patient ID: Susan Contreras, female    DOB: 13-Mar-1941, 73 y.o.   MRN: [id number]  HPI   [name] is a pleasant 73 year old white female known to Dr. Scarlette Shorts. She has history of chronic GERD and an early esophageal stricture. Also with history of colon polyps and family history of colon cancer in her sister. Patient was seen in the office last in April and at that time was to schedule EGD and colonoscopy however she had not followed through with those . She  comes in today with complaints of intermittent dysphagia especially to pills. He is not having any difficulty with liquids and generally says food is not causing any problems either. She has been on Dexilant  60 mg by mouth daily and that works well but is expensive and she wonders if he would be okay to go back to omeprazole 40 mg twice daily. Her last colonoscopy was done in 2007 she had left-sided diverticulosis internal and external hemorrhoids no polyps. She denies any changes in her bowel habits melena or hematochezia. She has been having some right-sided abdominal pain which she says waxes and wanes but has been present for the most part over the past few months. Sometimes it seems to be worse with bending but not always. She may also noticed it more after eating. She is status post cholecystectomy. The pain is dull located in her right midabdomen and does not radiate. She is concerned because the pain has been persisting.   Review of Systems  Constitutional: Negative.   HENT: Positive for trouble swallowing.   Eyes: Negative.   Respiratory: Negative.   Cardiovascular: Negative.   Gastrointestinal: Positive for abdominal pain and constipation.  Endocrine: Negative.   Genitourinary: Negative.   Musculoskeletal: Negative.   Skin: Negative.   Allergic/Immunologic: Negative.   Neurological: Negative.   Hematological: Negative.   Psychiatric/Behavioral: Negative.    Outpatient Prescriptions Prior to Visit  Medication  Sig Dispense Refill  . dexlansoprazole (DEXILANT) 60 MG capsule Take 1 capsule (60 mg total) by mouth daily.  30 capsule  3  . fluticasone (FLONASE) 50 MCG/ACT nasal spray USE ONE SPRAY IN EACH NOSTRIL EVERY DAY  16 g  0  . irbesartan (AVAPRO) 150 MG tablet Take 1 tablet (150 mg total) by mouth at bedtime.  30 tablet  5  . levothyroxine (SYNTHROID, LEVOTHROID) 50 MCG tablet Take 1 tablet (50 mcg total) by mouth daily.  30 tablet  5  . meclizine (ANTIVERT) 25 MG tablet Take 1 tablet (25 mg total) by mouth 3 (three) times daily as needed for dizziness.  30 tablet  0  . VIVELLE-DOT 0.05 MG/24HR patch       . amoxicillin (AMOXIL) 500 MG capsule Take 1 capsule (500 mg total) by mouth 3 (three) times daily.  42 capsule  0   No facility-administered medications prior to visit.   Allergies  Allergen Reactions  . Biaxin [Clarithromycin]   . Cephalexin   . Ciprofloxacin   . Doxycycline     Headaches  . Flagyl [Metronidazole Hcl]   . Ketek [Telithromycin]   . Sulfa Antibiotics    Patient Active Problem List   Diagnosis Date Noted  . Cholelithiasis 01/15/2011  . Family hx of colon cancer 01/15/2011  . ADENOMATOUS COLONIC POLYP 11/11/2007  . ANXIETY 11/11/2007  . DEPRESSION 11/11/2007  . HYPERTENSION 11/11/2007  . HEMORRHOIDS 11/11/2007  . ESOPHAGEAL STRICTURE 11/11/2007  . GERD 11/11/2007  . HIATAL HERNIA 11/11/2007  .  DIVERTICULOSIS OF COLON 11/11/2007   History  Substance Use Topics  . Smoking status: Never Smoker   . Smokeless tobacco: Never Used  . Alcohol Use: No   family history includes Colon cancer in her sister; Heart disease in her mother; Ovarian cancer in her sister; Parkinson's disease in her father; Stroke in her mother. There is no history of Esophageal cancer, Stomach cancer, Kidney disease, Liver disease, or Diabetes.     Objective:   Physical Exam   well-developed older white female in no acute distress, pleasant blood pressure 110/70 pulse 76 height 5 foot 1  weight 189. HEENT; nontraumatic normocephalic EOMI PERRLA sclera anicteric, Supple; no JVD, Cardiovascular; regular rate and rhythm with S1-S2 no murmur or gallop, Pulmonary ;clear bilaterally, Abdomen ;soft, minimal tenderness in the right upper quadrant with deep palpation no guarding or rebound no palpable mass or hepatosplenomegaly bowel sounds are present, Rectal; exam not done, Extremities; and the cold or on the and and and and and no clubbing cyanosis or edema skin warm and dry, Psych ;mood and affect appropriate      Assessment & Plan:  #107  73 year old female with chronic GERD-well controlled on Dexilant, however cost is an issue #2 dysphagia primarily to pills. Patient has previously documented early esophageal stricture in 2007 not dilated #3 positive family history of colon cancer in patient's sister. She is overdue for followup colonoscopy #4 right upper quadrant /right mid quadrant abdominal pain. Etiology not clear  Plan; long discussion with patient regarding the importance of continued surveillance colonoscopies especially with her family history. She is agreeable to proceed with colonoscopy and EGD with possible dilation with Dr. Henrene Pastor. Both procedures were discussed in detail with the patient and she is agreeable to proceed Will stop Dexilant and switch back to omeprazole 40 mg by mouth twice daily Schedule for upper abdominal ultrasound Check hepatic panel

## 2014-05-26 NOTE — Progress Notes (Signed)
Agree with initial assessment and plans as outlined 

## 2014-05-29 ENCOUNTER — Ambulatory Visit (INDEPENDENT_AMBULATORY_CARE_PROVIDER_SITE_OTHER): Payer: Commercial Managed Care - HMO | Admitting: Family

## 2014-05-29 ENCOUNTER — Encounter: Payer: Self-pay | Admitting: Family

## 2014-05-29 VITALS — BP 127/72 | HR 74 | Temp 98.4°F | Ht 61.0 in | Wt 189.6 lb

## 2014-05-29 DIAGNOSIS — I10 Essential (primary) hypertension: Secondary | ICD-10-CM

## 2014-05-29 DIAGNOSIS — K219 Gastro-esophageal reflux disease without esophagitis: Secondary | ICD-10-CM

## 2014-05-29 DIAGNOSIS — E039 Hypothyroidism, unspecified: Secondary | ICD-10-CM | POA: Insufficient documentation

## 2014-05-29 DIAGNOSIS — E785 Hyperlipidemia, unspecified: Secondary | ICD-10-CM

## 2014-05-29 DIAGNOSIS — Z1321 Encounter for screening for nutritional disorder: Secondary | ICD-10-CM

## 2014-05-29 NOTE — Patient Instructions (Signed)

## 2014-05-29 NOTE — Progress Notes (Signed)
Subjective:    Patient ID: Susan Contreras, female    DOB: [birth year], 73 y.o.   MRN: [id number]  Hypertension This is a chronic problem. The current episode started more than 1 year ago. The problem has been resolved since onset. The problem is controlled. Pertinent negatives include no anxiety, headaches, palpitations, peripheral edema or shortness of breath. Risk factors for coronary artery disease include post-menopausal state, obesity and dyslipidemia. Past treatments include angiotensin blockers. The current treatment provides moderate improvement. Hypertensive end-organ damage includes a thyroid problem. There is no history of kidney disease, CAD/MI, CVA or heart failure. There is no history of sleep apnea.  Thyroid Problem Presents for follow-up visit. Symptoms include fatigue ("at times"). Patient reports no anxiety, depressed mood, diarrhea or palpitations. The symptoms have been improving. Past treatments include levothyroxine. The treatment provided moderate relief. There is no history of heart failure.  Gastrophageal Reflux She reports no belching, no coughing, no heartburn or no sore throat. This is a chronic problem. The current episode started more than 1 year ago. The problem occurs rarely. The problem has been waxing and waning. The symptoms are aggravated by certain foods and lying down. Associated symptoms include fatigue ("at times"). She has tried a PPI for the symptoms. The treatment provided moderate relief.      Review of Systems  Constitutional: Positive for fatigue ("at times").  HENT: Negative.  Negative for sore throat.   Eyes: Negative.   Respiratory: Negative.  Negative for cough and shortness of breath.   Cardiovascular: Negative.  Negative for palpitations.  Gastrointestinal: Negative.  Negative for heartburn and diarrhea.  Endocrine: Negative.   Genitourinary: Negative.   Musculoskeletal: Negative.   Neurological: Negative.  Negative for headaches.    Hematological: Negative.   Psychiatric/Behavioral: Negative.   All other systems reviewed and are negative.      Objective:   Physical Exam  Vitals reviewed. Constitutional: She is oriented to person, place, and time. She appears well-developed and well-nourished. No distress.  HENT:  Head: Normocephalic and atraumatic.  Right Ear: External ear normal.  Left Ear: External ear normal.  Nose: Nose normal.  Mouth/Throat: Oropharynx is clear and moist.  Small lesion on left side of tongue with white center- Pt states it is painless and appeared 3 weeks ago- Pt told to keep an eye on it for next couple of weeks and if does not resolve may need referral to ENT  Eyes: Pupils are equal, round, and reactive to light.  Neck: Normal range of motion. Neck supple. No thyromegaly present.  Cardiovascular: Normal rate, regular rhythm, normal heart sounds and intact distal pulses.   No murmur heard. Pulmonary/Chest: Effort normal and breath sounds normal. No respiratory distress. She has no wheezes.  Abdominal: Soft. Bowel sounds are normal. She exhibits no distension. There is no tenderness.  Musculoskeletal: Normal range of motion. She exhibits no edema and no tenderness.  Neurological: She is alert and oriented to person, place, and time. She has normal reflexes. No cranial nerve deficit.  Skin: Skin is warm and dry.  Psychiatric: She has a normal mood and affect. Her behavior is normal. Judgment and thought content normal.    BP 127/72  Pulse 74  Temp(Src) 98.4 F (36.9 C) (Oral)  Ht 5' 1"  (1.549 m)  Wt 189 lb 9.6 oz (86.002 kg)  BMI 35.84 kg/m2       Assessment & Plan:  1. Essential hypertension - BMP8+EGFR  2. Gastroesophageal reflux disease, esophagitis presence not specified -  BMP8+EGFR  3. Hypothyroidism, unspecified hypothyroidism type - BMP8+EGFR - Thyroid Panel With TSH  4. Encounter for vitamin deficiency screening - Vit D  25 hydroxy (rtn osteoporosis  monitoring)  5. Hyperlipidemia - Lipid panel   Continue all meds Labs pending Health Maintenance reviewed- Refused all vaccines Diet and exercise encouraged RTO 6 months follow-up  [name], FNP

## 2014-05-30 ENCOUNTER — Ambulatory Visit (HOSPITAL_COMMUNITY): Payer: Medicare HMO

## 2014-05-30 ENCOUNTER — Other Ambulatory Visit: Payer: Self-pay | Admitting: Family

## 2014-05-30 LAB — LIPID PANEL
Chol/HDL Ratio: 7.8 ratio units — ABNORMAL HIGH (ref 0.0–4.4)
Cholesterol, Total: 287 mg/dL — ABNORMAL HIGH (ref 100–199)
HDL: 37 mg/dL — ABNORMAL LOW (ref >39)
Triglycerides: 489 mg/dL — ABNORMAL HIGH (ref 0–149)

## 2014-05-30 LAB — BMP8+EGFR
BUN/Creatinine Ratio: 8 — ABNORMAL LOW (ref 11–26)
BUN: 12 mg/dL (ref 8–27)
CO2: 22 mmol/L (ref 18–29)
CREATININE: 1.46 mg/dL — AB (ref 0.57–1.00)
Calcium: 9.6 mg/dL (ref 8.7–10.3)
Chloride: 99 mmol/L (ref 97–108)
GFR calc Af Amer: 41 mL/min/{1.73_m2} — ABNORMAL LOW (ref >59)
GFR calc non Af Amer: 36 mL/min/{1.73_m2} — ABNORMAL LOW (ref >59)
GLUCOSE: 90 mg/dL (ref 65–99)
Potassium: 4.6 mmol/L (ref 3.5–5.2)
Sodium: 137 mmol/L (ref 134–144)

## 2014-05-30 LAB — THYROID PANEL WITH TSH
Free Thyroxine Index: 2.1 (ref 1.2–4.9)
T3 UPTAKE RATIO: 26 % (ref 24–39)
T4 TOTAL: 8.2 ug/dL (ref 4.5–12.0)
TSH: 4.46 u[IU]/mL (ref 0.450–4.500)

## 2014-05-30 LAB — VITAMIN D 25 HYDROXY (VIT D DEFICIENCY, FRACTURES): Vit D, 25-Hydroxy: 20.4 ng/mL — ABNORMAL LOW (ref 30.0–100.0)

## 2014-05-30 MED ORDER — VITAMIN D (ERGOCALCIFEROL) 1.25 MG (50000 UNIT) PO CAPS: 50000.0000 [IU] | ORAL_CAPSULE | ORAL | Status: DC

## 2014-05-31 ENCOUNTER — Ambulatory Visit (HOSPITAL_COMMUNITY)
Admission: RE | Admit: 2014-05-31 | Discharge: 2014-05-31 | Disposition: A | Discharge status: Home / Self Care | Payer: Medicare HMO | Source: Ambulatory Visit | Attending: Physician Assistant | Admitting: Physician Assistant

## 2014-05-31 DIAGNOSIS — R1011 Right upper quadrant pain: Secondary | ICD-10-CM | POA: Insufficient documentation

## 2014-06-12 ENCOUNTER — Other Ambulatory Visit: Payer: Self-pay | Admitting: Internal Medicine

## 2014-06-13 ENCOUNTER — Encounter: Payer: Self-pay | Admitting: Internal Medicine

## 2014-06-19 ENCOUNTER — Institutional Professional Consult (permissible substitution): Payer: Medicare HMO | Admitting: Internal Medicine

## 2014-07-03 ENCOUNTER — Encounter: Payer: Medicare HMO | Admitting: Internal Medicine

## 2014-07-17 ENCOUNTER — Other Ambulatory Visit: Payer: Self-pay | Admitting: Physician Assistant

## 2014-08-11 ENCOUNTER — Telehealth: Payer: Self-pay | Admitting: Family

## 2014-08-11 ENCOUNTER — Other Ambulatory Visit: Payer: Self-pay | Admitting: *Deleted

## 2014-08-11 MED ORDER — IRBESARTAN 150 MG PO TABS: 150.0000 mg | ORAL_TABLET | Freq: Every day | ORAL | Status: DC

## 2014-08-11 MED ORDER — LEVOTHYROXINE SODIUM 50 MCG PO TABS: 50.0000 ug | ORAL_TABLET | Freq: Every day | ORAL | Status: DC

## 2014-08-11 NOTE — Telephone Encounter (Signed)
Pt aware of appointment date/time  08/28/2014 at 2:00

## 2014-08-28 DIAGNOSIS — J310 Chronic rhinitis: Secondary | ICD-10-CM | POA: Diagnosis not present

## 2014-08-28 DIAGNOSIS — R220 Localized swelling, mass and lump, head: Secondary | ICD-10-CM | POA: Diagnosis not present

## 2014-09-22 ENCOUNTER — Ambulatory Visit (INDEPENDENT_AMBULATORY_CARE_PROVIDER_SITE_OTHER): Payer: Commercial Managed Care - HMO | Admitting: Family

## 2014-09-22 ENCOUNTER — Encounter: Payer: Self-pay | Admitting: Family

## 2014-09-22 VITALS — BP 128/80 | HR 83 | Temp 97.3°F | Ht 61.0 in | Wt 188.0 lb

## 2014-09-22 DIAGNOSIS — J019 Acute sinusitis, unspecified: Secondary | ICD-10-CM | POA: Diagnosis not present

## 2014-09-22 MED ORDER — AMOXICILLIN 500 MG PO CAPS: 500.0000 mg | ORAL_CAPSULE | Freq: Two times a day (BID) | ORAL | Status: DC

## 2014-09-22 NOTE — Patient Instructions (Addendum)
Sinusitis Sinusitis is redness, soreness, and inflammation of the paranasal sinuses. Paranasal sinuses are air pockets within the bones of your face (beneath the eyes, the middle of the forehead, or above the eyes). In healthy paranasal sinuses, mucus is able to drain out, and air is able to circulate through them by way of your nose. However, when your paranasal sinuses are inflamed, mucus and air can become trapped. This can allow bacteria and other germs to grow and cause infection. Sinusitis can develop quickly and last only a short time (acute) or continue over a long period (chronic). Sinusitis that lasts for more than 12 weeks is considered chronic.  CAUSES  Causes of sinusitis include:  Allergies.  Structural abnormalities, such as displacement of the cartilage that separates your nostrils (deviated septum), which can decrease the air flow through your nose and sinuses and affect sinus drainage.  Functional abnormalities, such as when the small hairs (cilia) that line your sinuses and help remove mucus do not work properly or are not present. SIGNS AND SYMPTOMS  Symptoms of acute and chronic sinusitis are the same. The primary symptoms are pain and pressure around the affected sinuses. Other symptoms include:  Upper toothache.  Earache.  Headache.  Bad breath.  Decreased sense of smell and taste.  A cough, which worsens when you are lying flat.  Fatigue.  Fever.  Thick drainage from your nose, which often is green and may contain pus (purulent).  Swelling and warmth over the affected sinuses. DIAGNOSIS  Your health care provider will perform a physical exam. During the exam, your health care provider may:  Look in your nose for signs of abnormal growths in your nostrils (nasal polyps).  Tap over the affected sinus to check for signs of infection.  View the inside of your sinuses (endoscopy) using an imaging device that has a light attached (endoscope). If your health  care provider suspects that you have chronic sinusitis, one or more of the following tests may be recommended:  Allergy tests.  Nasal culture. A sample of mucus is taken from your nose, sent to a lab, and screened for bacteria.  Nasal cytology. A sample of mucus is taken from your nose and examined by your health care provider to determine if your sinusitis is related to an allergy. TREATMENT  Most cases of acute sinusitis are related to a viral infection and will resolve on their own within 10 days. Sometimes medicines are prescribed to help relieve symptoms (pain medicine, decongestants, nasal steroid sprays, or saline sprays).  However, for sinusitis related to a bacterial infection, your health care provider will prescribe antibiotic medicines. These are medicines that will help kill the bacteria causing the infection.  Rarely, sinusitis is caused by a fungal infection. In theses cases, your health care provider will prescribe antifungal medicine. For some cases of chronic sinusitis, surgery is needed. Generally, these are cases in which sinusitis recurs more than 3 times per year, despite other treatments. HOME CARE INSTRUCTIONS   Drink plenty of water. Water helps thin the mucus so your sinuses can drain more easily.  Use a humidifier.  Inhale steam 3 to 4 times a day (for example, sit in the bathroom with the shower running).  Apply a warm, moist washcloth to your face 3 to 4 times a day, or as directed by your health care provider.  Use saline nasal sprays to help moisten and clean your sinuses.  Take medicines only as directed by your health care provider.    If you were prescribed either an antibiotic or antifungal medicine, finish it all even if you start to feel better. SEEK IMMEDIATE MEDICAL CARE IF:  You have increasing pain or severe headaches.  You have nausea, vomiting, or drowsiness.  You have swelling around your face.  You have vision problems.  You have a stiff  neck.  You have difficulty breathing. MAKE SURE YOU:   Understand these instructions.  Will watch your condition.  Will get help right away if you are not doing well or get worse. Document Released: 08/11/2005 Document Revised: 12/26/2013 Document Reviewed: 08/26/2011 [employer] Patient Information 2015 [employer]. This information is not intended to replace advice given to you by your health care provider. Make sure you discuss any questions you have with your health care provider.  - Take meds as prescribed - Use a cool mist humidifier  -Use saline nose sprays frequently -Saline irrigations of the nose can be very helpful if done frequently.  * 4X daily for 1 week*  * Use of a nettie pot can be helpful with this. Follow directions with this* -Force fluids -For any cough or congestion  Use plain Mucinex- regular strength or max strength is fine   * Children- consult with Pharmacist for dosing -For fever or aces or pains- take tylenol or ibuprofen appropriate for age and weight.  * for fevers greater than 101 orally you may alternate ibuprofen and tylenol every  3 hours. -Throat lozenges if help   Markavious Micco, FNP  

## 2014-09-22 NOTE — Progress Notes (Signed)
   Subjective:    Patient ID: Susan Contreras, female    DOB: Jan 01, 1941, 74 y.o.   MRN: [id number]  Sinusitis This is a new problem. The current episode started 1 to 4 weeks ago. The problem has been waxing and waning since onset. There has been no fever. Her pain is at a severity of 4/10. The pain is moderate. Associated symptoms include congestion, coughing, ear pain, headaches, sinus pressure, sneezing and a sore throat. Pertinent negatives include no shortness of breath. Past treatments include oral decongestants (Norel). The treatment provided mild relief.      Review of Systems  Constitutional: Negative.   HENT: Positive for congestion, ear pain, postnasal drip, sinus pressure, sneezing and sore throat.   Eyes: Negative.   Respiratory: Positive for cough. Negative for shortness of breath.   Cardiovascular: Negative.  Negative for palpitations.  Gastrointestinal: Negative.   Endocrine: Negative.   Genitourinary: Negative.   Musculoskeletal: Negative.   Neurological: Positive for headaches.  Hematological: Negative.   Psychiatric/Behavioral: Negative.   All other systems reviewed and are negative.      Objective:   Physical Exam  Constitutional: She is oriented to person, place, and time. She appears well-developed and well-nourished. No distress.  HENT:  Head: Normocephalic and atraumatic.  Right Ear: External ear normal.  Nose: Right sinus exhibits maxillary sinus tenderness and frontal sinus tenderness. Left sinus exhibits maxillary sinus tenderness and frontal sinus tenderness.  Nasal passage erythemas with mild swelling  Oropharynx erythemas  Eyes: Pupils are equal, round, and reactive to light.  Neck: Normal range of motion. Neck supple. No thyromegaly present.  Cardiovascular: Normal rate, regular rhythm, normal heart sounds and intact distal pulses.   No murmur heard. Pulmonary/Chest: Effort normal and breath sounds normal. No respiratory distress. She has no  wheezes.  Abdominal: Soft. Bowel sounds are normal. She exhibits no distension. There is no tenderness.  Musculoskeletal: Normal range of motion. She exhibits no edema or tenderness.  Neurological: She is alert and oriented to person, place, and time. She has normal reflexes. No cranial nerve deficit.  Skin: Skin is warm and dry.  Psychiatric: She has a normal mood and affect. Her behavior is normal. Judgment and thought content normal.  Vitals reviewed.     BP 128/80 mmHg  Pulse 83  Temp(Src) 97.3 F (36.3 C) (Oral)  Ht 5\' 1"  (1.549 m)  Wt 188 lb (85.276 kg)  BMI 35.54 kg/m2     Assessment & Plan:  1. Acute sinusitis, recurrence not specified, unspecified location -- Take meds as prescribed - Use a cool mist humidifier  -Use saline nose sprays frequently -Saline irrigations of the nose can be very helpful if done frequently.  * 4X daily for 1 week*  * Use of a nettie pot can be helpful with this. Follow directions with this* -Force fluids -For any cough or congestion  Use plain Mucinex- regular strength or max strength is fine   * Children- consult with Pharmacist for dosing -For fever or aces or pains- take tylenol or ibuprofen appropriate for age and weight.  * for fevers greater than 101 orally you may alternate ibuprofen and tylenol every  3 hours. -Throat lozenges if helps - amoxicillin (AMOXIL) 500 MG capsule; Take 1 capsule (500 mg total) by mouth 2 (two) times daily.  Dispense: 14 capsule; Refill: 0  [name], FNP

## 2014-10-16 ENCOUNTER — Telehealth: Payer: Self-pay | Admitting: Internal Medicine

## 2014-10-17 MED ORDER — DEXLANSOPRAZOLE 60 MG PO CPDR: 1.0000 | DELAYED_RELEASE_CAPSULE | Freq: Every day | ORAL | Status: DC

## 2014-10-17 NOTE — Telephone Encounter (Signed)
Sent Dexilant

## 2014-10-25 ENCOUNTER — Telehealth: Payer: Self-pay | Admitting: Family

## 2014-10-25 MED ORDER — AMOXICILLIN-POT CLAVULANATE 875-125 MG PO TABS: 1.0000 | ORAL_TABLET | Freq: Two times a day (BID) | ORAL | Status: DC

## 2014-10-25 NOTE — Telephone Encounter (Signed)
Please review and advise.

## 2014-10-25 NOTE — Telephone Encounter (Signed)
Prescription sent to pharmacy.

## 2014-10-26 ENCOUNTER — Telehealth: Payer: Self-pay | Admitting: *Deleted

## 2014-10-26 ENCOUNTER — Telehealth: Payer: Self-pay | Admitting: Family

## 2014-10-26 DIAGNOSIS — J019 Acute sinusitis, unspecified: Secondary | ICD-10-CM

## 2014-10-26 MED ORDER — AMOXICILLIN 500 MG PO CAPS: 500.0000 mg | ORAL_CAPSULE | Freq: Three times a day (TID) | ORAL | Status: DC

## 2014-10-26 NOTE — Telephone Encounter (Signed)
Amoxicillin rx sent to pharamcy

## 2014-10-26 NOTE — Telephone Encounter (Signed)
Patient aware rx sent to pharmacy.  

## 2014-10-26 NOTE — Telephone Encounter (Signed)
Patient prescribed Augmentin yesterday but the pill is too big for her to swallow. She is requesting Amoxicillin 500mg  TID instead. She has taken this in the past with no problems.  Please send to [facility].

## 2014-10-26 NOTE — Telephone Encounter (Signed)
This was entered in the wrong chart. Corrected in the right chart.

## 2015-01-02 ENCOUNTER — Other Ambulatory Visit: Payer: Self-pay | Admitting: Family

## 2015-01-13 ENCOUNTER — Ambulatory Visit (INDEPENDENT_AMBULATORY_CARE_PROVIDER_SITE_OTHER): Payer: Commercial Managed Care - HMO | Admitting: General Practice

## 2015-01-13 VITALS — BP 111/74 | HR 80 | Temp 98.7°F | Ht 61.0 in | Wt 189.9 lb

## 2015-01-13 DIAGNOSIS — J0100 Acute maxillary sinusitis, unspecified: Secondary | ICD-10-CM

## 2015-01-13 MED ORDER — AMOXICILLIN 500 MG PO CAPS
500.0000 mg | ORAL_CAPSULE | Freq: Three times a day (TID) | ORAL | Status: DC
Start: 2015-01-13 — End: 2015-03-06

## 2015-01-13 NOTE — Progress Notes (Signed)
   Subjective:    Patient ID: Susan Contreras, female    DOB: 1940-10-12, 74 y.o.   MRN: [id number]  Sinusitis This is a new problem. The current episode started in the past 7 days. The problem has been gradually worsening since onset. There has been no fever. Her pain is at a severity of 7/10. The pain is moderate. Associated symptoms include chills, congestion, coughing, headaches, a hoarse voice and sinus pressure. Pertinent negatives include no shortness of breath, sneezing or sore throat. Past treatments include oral decongestants. The treatment provided mild relief.      Review of Systems  Constitutional: Positive for chills.  HENT: Positive for congestion, facial swelling, hoarse voice, postnasal drip and sinus pressure. Negative for sneezing and sore throat.   Eyes: Negative.   Respiratory: Positive for cough. Negative for chest tightness and shortness of breath.   Cardiovascular: Negative for chest pain.  Neurological: Positive for headaches.       Objective:   Physical Exam  Constitutional: She is oriented to person, place, and time. She appears well-developed and well-nourished.  HENT:  Head: Normocephalic and atraumatic.  Right Ear: External ear normal.  Left Ear: External ear normal.  Nose: Right sinus exhibits maxillary sinus tenderness. Left sinus exhibits maxillary sinus tenderness.  Mouth/Throat: Oropharynx is clear and moist.  Cardiovascular: Normal rate, regular rhythm and normal heart sounds.   Pulmonary/Chest: Effort normal and breath sounds normal.  Neurological: She is alert and oriented to person, place, and time.  Skin: Skin is warm and dry.  Psychiatric: She has a normal mood and affect.          Assessment & Plan:  1. Acute maxillary sinusitis, recurrence not specified - amoxicillin (AMOXIL) 500 MG capsule; Take 1 capsule (500 mg total) by mouth 3 (three) times daily.  Dispense: 30 capsule; Refill: 0 -discussed and provided patient education -push  fluids -proper handwashing -RTO prn/seek emergency medicine if needed -Patient verbalized understanding Erby Pian, FNP-C

## 2015-01-13 NOTE — Patient Instructions (Signed)

## 2015-01-30 ENCOUNTER — Other Ambulatory Visit: Payer: Self-pay | Admitting: Internal Medicine

## 2015-01-30 ENCOUNTER — Other Ambulatory Visit: Payer: Self-pay | Admitting: Family Medicine

## 2015-01-30 NOTE — Telephone Encounter (Signed)
Last seen 09/22/14 [facility]

## 2015-03-06 ENCOUNTER — Telehealth: Payer: Self-pay | Admitting: *Deleted

## 2015-03-06 ENCOUNTER — Ambulatory Visit (INDEPENDENT_AMBULATORY_CARE_PROVIDER_SITE_OTHER): Payer: Commercial Managed Care - HMO

## 2015-03-06 ENCOUNTER — Encounter: Payer: Self-pay | Admitting: Family

## 2015-03-06 ENCOUNTER — Ambulatory Visit (INDEPENDENT_AMBULATORY_CARE_PROVIDER_SITE_OTHER): Payer: Commercial Managed Care - HMO | Admitting: Family

## 2015-03-06 VITALS — BP 118/66 | HR 80 | Temp 98.2°F | Ht 61.0 in | Wt 187.8 lb

## 2015-03-06 DIAGNOSIS — R0602 Shortness of breath: Secondary | ICD-10-CM

## 2015-03-06 DIAGNOSIS — R1013 Epigastric pain: Secondary | ICD-10-CM | POA: Diagnosis not present

## 2015-03-06 DIAGNOSIS — R195 Other fecal abnormalities: Secondary | ICD-10-CM

## 2015-03-06 DIAGNOSIS — K219 Gastro-esophageal reflux disease without esophagitis: Secondary | ICD-10-CM

## 2015-03-06 LAB — POCT CBC
Granulocyte percent: 56.5 %G (ref 37–80)
HEMATOCRIT: 39.5 % (ref 37.7–47.9)
Hemoglobin: 13.2 g/dL (ref 12.2–16.2)
LYMPH, POC: 4 — AB (ref 0.6–3.4)
MCH, POC: 28.7 pg (ref 27–31.2)
MCHC: 33.4 g/dL (ref 31.8–35.4)
MCV: 86.1 fL (ref 80–97)
MPV: 8.5 fL (ref 0–99.8)
POC Granulocyte: 5.8 (ref 2–6.9)
POC LYMPH PERCENT: 38.8 %L (ref 10–50)
Platelet Count, POC: 289 10*3/uL (ref 142–424)
RBC: 4.59 M/uL (ref 4.04–5.48)
RDW, POC: 14.3 %
WBC: 10.2 10*3/uL (ref 4.6–10.2)

## 2015-03-06 MED ORDER — PANTOPRAZOLE SODIUM 40 MG PO TBEC: 40.0000 mg | DELAYED_RELEASE_TABLET | Freq: Every day | ORAL | Status: DC

## 2015-03-06 MED ORDER — ALPRAZOLAM 0.25 MG PO TABS: 0.2500 mg | ORAL_TABLET | Freq: Every evening | ORAL | Status: DC | PRN

## 2015-03-06 NOTE — Addendum Note (Signed)
Addended by: [name] A on: 03/06/2015 06:01 PM   Modules accepted: Orders

## 2015-03-06 NOTE — Progress Notes (Signed)
Subjective:    Patient ID: Susan Contreras, female    DOB: [birth year], 74 y.o.   MRN: [id number]  Shortness of Breath Associated symptoms include abdominal pain. Pertinent negatives include no headaches or vomiting.  Abdominal Pain This is a new problem. The current episode started in the past 7 days. The onset quality is sudden. The problem occurs constantly. The problem has been waxing and waning. The pain is located in the epigastric region. The pain is at a severity of 8/10. The pain is moderate. The quality of the pain is aching. The abdominal pain does not radiate. Associated symptoms include belching, diarrhea, flatus and nausea. Pertinent negatives include no dysuria, frequency, headaches or vomiting. Nothing aggravates the pain. She has tried antacids for the symptoms. The treatment provided moderate relief.   *Pt states she is have intermittent epigastric abd pain with diarrhea. Pt states her stools are dark black. Pt states the pain is relieved by pepto bismol.    Review of Systems  Constitutional: Positive for appetite change and fatigue.  HENT: Negative.   Eyes: Negative.   Respiratory: Positive for shortness of breath.   Cardiovascular: Negative.  Negative for palpitations.  Gastrointestinal: Positive for nausea, abdominal pain, diarrhea and flatus. Negative for vomiting.  Endocrine: Negative.   Genitourinary: Negative.  Negative for dysuria and frequency.  Musculoskeletal: Negative.   Neurological: Negative.  Negative for headaches.  Hematological: Negative.   Psychiatric/Behavioral: Negative.   All other systems reviewed and are negative.      Objective:   Physical Exam  Constitutional: She is oriented to person, place, and time. She appears well-developed and well-nourished. No distress.  HENT:  Head: Normocephalic and atraumatic.  Right Ear: External ear normal.  Left Ear: External ear normal.  Nose: Nose normal.  Mouth/Throat: Oropharynx is clear and moist.    Eyes: Pupils are equal, round, and reactive to light.  Neck: Normal range of motion. Neck supple. No thyromegaly present.  Cardiovascular: Normal rate, regular rhythm, normal heart sounds and intact distal pulses.   No murmur heard. Pulmonary/Chest: Effort normal and breath sounds normal. No respiratory distress. She has no wheezes.  Abdominal: Soft. Bowel sounds are normal. She exhibits no distension. There is tenderness (mild tenderness in RLQ & LLQ).  Musculoskeletal: Normal range of motion. She exhibits no edema or tenderness.  Neurological: She is alert and oriented to person, place, and time. She has normal reflexes. No cranial nerve deficit.  Skin: Skin is warm and dry.  Psychiatric: She has a normal mood and affect. Her behavior is normal. Judgment and thought content normal.  Vitals reviewed.   BP 118/66 mmHg  Pulse 80  Temp(Src) 98.2 F (36.8 C) (Oral)  Ht 5' 1"  (1.549 m)  Wt 187 lb 12.8 oz (85.186 kg)  BMI 35.50 kg/m2  SpO2 99%       Assessment & Plan:  1. SOB (shortness of breath) - DG Chest 2 View; Future - CMP14+EGFR  2. Abdominal pain, epigastric - CMP14+EGFR - H Pylori, IGM, IGG, IGA AB - Fecal occult blood, imunochemical  3. Dark stools - POCT CBC - CMP14+EGFR - Fecal occult blood, imunochemical  4. Gastroesophageal reflux disease, esophagitis presence not specified - pantoprazole (PROTONIX) 40 MG tablet; Take 1 tablet (40 mg total) by mouth daily.  Dispense: 90 tablet; Refill: 3  Diet discussed for GERD Labs pending- Rule out GI bleed Avoid any NSAID's Pt may need CT scan- diverticulitis or colitis? Pt does not have gallbladder or GI referral RTO  in 1 week  [name], FNP

## 2015-03-06 NOTE — Patient Instructions (Signed)
Gastroesophageal Reflux Disease, Adult  Gastroesophageal reflux disease (GERD) happens when acid from your stomach flows up into the esophagus. When acid comes in contact with the esophagus, the acid causes soreness (inflammation) in the esophagus. Over time, GERD may create small holes (ulcers) in the lining of the esophagus.  CAUSES   · Increased body weight. This puts pressure on the stomach, making acid rise from the stomach into the esophagus.  · Smoking. This increases acid production in the stomach.  · Drinking alcohol. This causes decreased pressure in the lower esophageal sphincter (valve or ring of muscle between the esophagus and stomach), allowing acid from the stomach into the esophagus.  · Late evening meals and a full stomach. This increases pressure and acid production in the stomach.  · A malformed lower esophageal sphincter.  Sometimes, no cause is found.  SYMPTOMS   · Burning pain in the lower part of the mid-chest behind the breastbone and in the mid-stomach area. This may occur twice a week or more often.  · Trouble swallowing.  · Sore throat.  · Dry cough.  · Asthma-like symptoms including chest tightness, shortness of breath, or wheezing.  DIAGNOSIS   Your caregiver may be able to diagnose GERD based on your symptoms. In some cases, X-rays and other tests may be done to check for complications or to check the condition of your stomach and esophagus.  TREATMENT   Your caregiver may recommend over-the-counter or prescription medicines to help decrease acid production. Ask your caregiver before starting or adding any new medicines.   HOME CARE INSTRUCTIONS   · Change the factors that you can control. Ask your caregiver for guidance concerning weight loss, quitting smoking, and alcohol consumption.  · Avoid foods and drinks that make your symptoms worse, such as:  ¨ Caffeine or alcoholic drinks.  ¨ Chocolate.  ¨ Peppermint or mint flavorings.  ¨ Garlic and onions.  ¨ Spicy foods.  ¨ Citrus fruits,  such as oranges, lemons, or limes.  ¨ Tomato-based foods such as sauce, chili, salsa, and pizza.  ¨ Fried and fatty foods.  · Avoid lying down for the 3 hours prior to your bedtime or prior to taking a nap.  · Eat small, frequent meals instead of large meals.  · Wear loose-fitting clothing. Do not wear anything tight around your waist that causes pressure on your stomach.  · Raise the head of your bed 6 to 8 inches with wood blocks to help you sleep. Extra pillows will not help.  · Only take over-the-counter or prescription medicines for pain, discomfort, or fever as directed by your caregiver.  · Do not take aspirin, ibuprofen, or other nonsteroidal anti-inflammatory drugs (NSAIDs).  SEEK IMMEDIATE MEDICAL CARE IF:   · You have pain in your arms, neck, jaw, teeth, or back.  · Your pain increases or changes in intensity or duration.  · You develop nausea, vomiting, or sweating (diaphoresis).  · You develop shortness of breath, or you faint.  · Your vomit is green, yellow, black, or looks like coffee grounds or blood.  · Your stool is red, bloody, or black.  These symptoms could be signs of other problems, such as heart disease, gastric bleeding, or esophageal bleeding.  MAKE SURE YOU:   · Understand these instructions.  · Will watch your condition.  · Will get help right away if you are not doing well or get worse.  Document Released: 05/21/2005 Document Revised: 11/03/2011 Document Reviewed: 02/28/2011  ExitCare® Patient   Information ©2015 [employer]. This information is not intended to replace advice given to you by your health care provider. Make sure you discuss any questions you have with your health care provider.  Food Choices for Gastroesophageal Reflux Disease  When you have gastroesophageal reflux disease (GERD), the foods you eat and your eating habits are very important. Choosing the right foods can help ease the discomfort of GERD.  WHAT GENERAL GUIDELINES DO I NEED TO FOLLOW?  · Choose fruits,  vegetables, whole grains, low-fat dairy products, and low-fat meat, fish, and poultry.  · Limit fats such as oils, salad dressings, butter, nuts, and avocado.  · Keep a food diary to identify foods that cause symptoms.  · Avoid foods that cause reflux. These may be different for different people.  · Eat frequent small meals instead of three large meals each day.  · Eat your meals slowly, in a relaxed setting.  · Limit fried foods.  · Cook foods using methods other than frying.  · Avoid drinking alcohol.  · Avoid drinking large amounts of liquids with your meals.  · Avoid bending over or lying down until 2-3 hours after eating.  WHAT FOODS ARE NOT RECOMMENDED?  The following are some foods and drinks that may worsen your symptoms:  Vegetables  Tomatoes. Tomato juice. Tomato and spaghetti sauce. Chili peppers. Onion and garlic. Horseradish.  Fruits  Oranges, grapefruit, and lemon (fruit and juice).  Meats  High-fat meats, fish, and poultry. This includes hot dogs, ribs, ham, sausage, salami, and bacon.  Dairy  Whole milk and chocolate milk. Sour cream. Cream. Butter. Ice cream. Cream cheese.   Beverages  Coffee and tea, with or without caffeine. Carbonated beverages or energy drinks.  Condiments  Hot sauce. Barbecue sauce.   Sweets/Desserts  Chocolate and cocoa. Donuts. Peppermint and spearmint.  Fats and Oils  High-fat foods, including French fries and potato chips.  Other  Vinegar. Strong spices, such as black pepper, white pepper, red pepper, cayenne, curry powder, cloves, ginger, and chili powder.  The items listed above may not be a complete list of foods and beverages to avoid. Contact your dietitian for more information.  Document Released: 08/11/2005 Document Revised: 08/16/2013 Document Reviewed: 06/15/2013  ExitCare® Patient Information ©2015 [employer]. This information is not intended to replace advice given to you by your health care provider. Make sure you discuss any questions you have with your  health care provider.

## 2015-03-06 NOTE — Telephone Encounter (Signed)
Patient called in complaining with SOB and wanted to be seen. Patient was given appointment for 6:30pm but advised to come in at 4:30pm so we can do xray if needed. Patient also advised if SOB worsened or she started having chest pain/discomfort she needed to go to ER.

## 2015-03-08 LAB — H PYLORI, IGM, IGG, IGA AB: H. pylori, IgA Abs: <9 units (ref 0.0–8.9)

## 2015-03-08 LAB — CMP14+EGFR
ALT: 12 IU/L (ref 0–32)
AST: 17 IU/L (ref 0–40)
Albumin/Globulin Ratio: 1.2 (ref 1.1–2.5)
Albumin: 4.6 g/dL (ref 3.5–4.8)
Alkaline Phosphatase: 87 IU/L (ref 39–117)
BUN / CREAT RATIO: 7 — AB (ref 11–26)
BUN: 9 mg/dL (ref 8–27)
Bilirubin Total: 0.6 mg/dL (ref 0.0–1.2)
CO2: 20 mmol/L (ref 18–29)
CREATININE: 1.36 mg/dL — AB (ref 0.57–1.00)
Calcium: 9.8 mg/dL (ref 8.7–10.3)
Chloride: 100 mmol/L (ref 97–108)
GFR, EST AFRICAN AMERICAN: 45 mL/min/{1.73_m2} — AB (ref >59)
GFR, EST NON AFRICAN AMERICAN: 39 mL/min/{1.73_m2} — AB (ref >59)
GLUCOSE: 100 mg/dL — AB (ref 65–99)
Globulin, Total: 3.7 g/dL (ref 1.5–4.5)
Potassium: 5.2 mmol/L (ref 3.5–5.2)
SODIUM: 141 mmol/L (ref 134–144)
TOTAL PROTEIN: 8.3 g/dL (ref 6.0–8.5)

## 2015-03-09 ENCOUNTER — Other Ambulatory Visit: Payer: Self-pay | Admitting: Family

## 2015-03-09 DIAGNOSIS — R7989 Other specified abnormal findings of blood chemistry: Secondary | ICD-10-CM

## 2015-03-09 NOTE — Progress Notes (Signed)
Patient aware.

## 2015-03-12 ENCOUNTER — Telehealth: Payer: Self-pay | Admitting: Family

## 2015-03-13 NOTE — Telephone Encounter (Signed)
Patient aware of results and aware that referral has been placed for nephrologist.

## 2015-03-15 ENCOUNTER — Ambulatory Visit (INDEPENDENT_AMBULATORY_CARE_PROVIDER_SITE_OTHER): Payer: Commercial Managed Care - HMO | Admitting: Family

## 2015-03-15 ENCOUNTER — Encounter: Payer: Self-pay | Admitting: Family

## 2015-03-15 VITALS — BP 103/68 | HR 81 | Temp 97.3°F | Ht 61.0 in | Wt 188.0 lb

## 2015-03-15 DIAGNOSIS — K219 Gastro-esophageal reflux disease without esophagitis: Secondary | ICD-10-CM

## 2015-03-15 DIAGNOSIS — R0602 Shortness of breath: Secondary | ICD-10-CM | POA: Diagnosis not present

## 2015-03-15 DIAGNOSIS — K449 Diaphragmatic hernia without obstruction or gangrene: Secondary | ICD-10-CM

## 2015-03-15 NOTE — Progress Notes (Signed)
   Subjective:    Patient ID: Susan Contreras, female    DOB: 10-02-1940, 74 y.o.   MRN: [id number]  HPI Pt presents to the office for continued SOB at times. PT was seen last week in the office and complained of "on and off SOB and intermittent epigastric pain". PT states she has uncontrolled GERD and states her SOB becomes worse when her "stomach starts to quiver". Pt was placed on Protonix and the pt states she has taken it two nights and woke up feeling nauseous. Pt states Pepto Bismol is the only thing "calms her stomach".  Pt has a GI doctor, but pt states she has not see him recently because "every time I go they want to get a colonoscopy".    Review of Systems  Constitutional: Negative.   HENT: Negative.   Eyes: Negative.   Respiratory: Negative.  Negative for shortness of breath.   Cardiovascular: Negative.  Negative for palpitations.  Gastrointestinal: Negative.   Endocrine: Negative.   Genitourinary: Negative.   Musculoskeletal: Negative.   Neurological: Negative.  Negative for headaches.  Hematological: Negative.   Psychiatric/Behavioral: Negative.   All other systems reviewed and are negative.      Objective:   Physical Exam  Constitutional: She is oriented to person, place, and time. She appears well-developed and well-nourished. No distress.  HENT:  Head: Normocephalic and atraumatic.  Right Ear: External ear normal.  Mouth/Throat: Oropharynx is clear and moist.  Eyes: Pupils are equal, round, and reactive to light.  Neck: Normal range of motion. Neck supple. No thyromegaly present.  Cardiovascular: Normal rate, regular rhythm, normal heart sounds and intact distal pulses.   No murmur heard. Pulmonary/Chest: Effort normal and breath sounds normal. No respiratory distress. She has no wheezes.  Abdominal: Soft. Bowel sounds are normal. She exhibits no distension. There is tenderness (mild epigastric tenderness).  Musculoskeletal: Normal range of motion. She exhibits no  edema or tenderness.  Neurological: She is alert and oriented to person, place, and time. She has normal reflexes. No cranial nerve deficit.  Skin: Skin is warm and dry.  Psychiatric: She has a normal mood and affect. Her behavior is normal. Judgment and thought content normal.  Vitals reviewed.   BP 103/68 mmHg  Pulse 81  Temp(Src) 97.3 F (36.3 C)  Ht 5\' 1"  (1.549 m)  Wt 188 lb (85.276 kg)  BMI 35.54 kg/m2  SpO2 98%       Assessment & Plan:  1. Shortness of breath - EKG 12-Lead - Ambulatory referral to Gastroenterology  2. Diaphragmatic hernia without obstruction and without gangrene - Ambulatory referral to Gastroenterology  3. Gastroesophageal reflux disease, esophagitis presence not specified - Ambulatory referral to Gastroenterology  -Diet discussed -Avoid eating 2-3 hours before bedtime -Continue current medications -Pt needs to f/u with GI -RTO prn  [name], FNP

## 2015-03-15 NOTE — Patient Instructions (Addendum)
Gastroesophageal Reflux Disease, Adult Gastroesophageal reflux disease (GERD) happens when acid from your stomach flows up into the esophagus. When acid comes in contact with the esophagus, the acid causes soreness (inflammation) in the esophagus. Over time, GERD may create small holes (ulcers) in the lining of the esophagus. CAUSES   Increased body weight. This puts pressure on the stomach, making acid rise from the stomach into the esophagus.  Smoking. This increases acid production in the stomach.  Drinking alcohol. This causes decreased pressure in the lower esophageal sphincter (valve or ring of muscle between the esophagus and stomach), allowing acid from the stomach into the esophagus.  Late evening meals and a full stomach. This increases pressure and acid production in the stomach.  A malformed lower esophageal sphincter. Sometimes, no cause is found. SYMPTOMS   Burning pain in the lower part of the mid-chest behind the breastbone and in the mid-stomach area. This may occur twice a week or more often.  Trouble swallowing.  Sore throat.  Dry cough.  Asthma-like symptoms including chest tightness, shortness of breath, or wheezing. DIAGNOSIS  Your caregiver may be able to diagnose GERD based on your symptoms. In some cases, X-rays and other tests may be done to check for complications or to check the condition of your stomach and esophagus. TREATMENT  Your caregiver may recommend over-the-counter or prescription medicines to help decrease acid production. Ask your caregiver before starting or adding any new medicines.  HOME CARE INSTRUCTIONS   Change the factors that you can control. Ask your caregiver for guidance concerning weight loss, quitting smoking, and alcohol consumption.  Avoid foods and drinks that make your symptoms worse, such as:  Caffeine or alcoholic drinks.  Chocolate.  Peppermint or mint flavorings.  Garlic and onions.  Spicy foods.  Citrus fruits,  such as oranges, lemons, or limes.  Tomato-based foods such as sauce, chili, salsa, and pizza.  Fried and fatty foods.  Avoid lying down for the 3 hours prior to your bedtime or prior to taking a nap.  Eat small, frequent meals instead of large meals.  Wear loose-fitting clothing. Do not wear anything tight around your waist that causes pressure on your stomach.  Raise the head of your bed 6 to 8 inches with wood blocks to help you sleep. Extra pillows will not help.  Only take over-the-counter or prescription medicines for pain, discomfort, or fever as directed by your caregiver.  Do not take aspirin, ibuprofen, or other nonsteroidal anti-inflammatory drugs (NSAIDs). SEEK IMMEDIATE MEDICAL CARE IF:   You have pain in your arms, neck, jaw, teeth, or back.  Your pain increases or changes in intensity or duration.  You develop nausea, vomiting, or sweating (diaphoresis).  You develop shortness of breath, or you faint.  Your vomit is green, yellow, black, or looks like coffee grounds or blood.  Your stool is red, bloody, or black. These symptoms could be signs of other problems, such as heart disease, gastric bleeding, or esophageal bleeding. MAKE SURE YOU:   Understand these instructions.  Will watch your condition.  Will get help right away if you are not doing well or get worse. Document Released: 05/21/2005 Document Revised: 11/03/2011 Document Reviewed: 02/28/2011 [facility] Patient Information 2015 [place], [place]. This information is not intended to replace advice given to you by your health care provider. Make sure you discuss any questions you have with your health care provider. Food Choices for Gastroesophageal Reflux Disease When you have gastroesophageal reflux disease (GERD), the foods you  eat and your eating habits are very important. Choosing the right foods can help ease the discomfort of GERD. WHAT GENERAL GUIDELINES DO I NEED TO FOLLOW?  Choose fruits,  vegetables, whole grains, low-fat dairy products, and low-fat meat, fish, and poultry.  Limit fats such as oils, salad dressings, butter, nuts, and avocado.  Keep a food diary to identify foods that cause symptoms.  Avoid foods that cause reflux. These may be different for different people.  Eat frequent small meals instead of three large meals each day.  Eat your meals slowly, in a relaxed setting.  Limit fried foods.  Cook foods using methods other than frying.  Avoid drinking alcohol.  Avoid drinking large amounts of liquids with your meals.  Avoid bending over or lying down until 2-3 hours after eating. WHAT FOODS ARE NOT RECOMMENDED? The following are some foods and drinks that may worsen your symptoms: Vegetables Tomatoes. Tomato juice. Tomato and spaghetti sauce. Chili peppers. Onion and garlic. Horseradish. Fruits Oranges, grapefruit, and lemon (fruit and juice). Meats High-fat meats, fish, and poultry. This includes hot dogs, ribs, ham, sausage, salami, and bacon. Dairy Whole milk and chocolate milk. Sour cream. Cream. Butter. Ice cream. Cream cheese.  Beverages Coffee and tea, with or without caffeine. Carbonated beverages or energy drinks. Condiments Hot sauce. Barbecue sauce.  Sweets/Desserts Chocolate and cocoa. Donuts. Peppermint and spearmint. Fats and Oils High-fat foods, including [place] fries and potato chips. Other Vinegar. Strong spices, such as black pepper, white pepper, red pepper, cayenne, curry powder, cloves, ginger, and chili powder. The items listed above may not be a complete list of foods and beverages to avoid. Contact your dietitian for more information. Document Released: 08/11/2005 Document Revised: 08/16/2013 Document Reviewed: 06/15/2013 [facility] Patient Information 2015 [place], [place]. This information is not intended to replace advice given to you by your health care provider. Make sure you discuss any questions you have with your  health care provider. Hiatal Hernia A hiatal hernia occurs when part of your stomach slides above the muscle that separates your abdomen from your chest (diaphragm). You can be born with a hiatal hernia (congenital), or it may develop over time. In almost all cases of hiatal hernia, only the top part of the stomach pushes through.  Many people have a hiatal hernia with no symptoms. The larger the hernia, the more likely that you will have symptoms. In some cases, a hiatal hernia allows stomach acid to flow back into the tube that carries food from your mouth to your stomach (esophagus). This may cause heartburn symptoms. Severe heartburn symptoms may mean you have developed a condition called gastroesophageal reflux disease (GERD).  CAUSES  Hiatal hernias are caused by a weakness in the opening (hiatus) where your esophagus passes through your diaphragm to attach to the upper part of your stomach. You may be born with a weakness in your hiatus, or a weakness can develop. RISK FACTORS Older age is a major risk factor for a hiatal hernia. Anything that increases pressure on your diaphragm can also increase your risk of a hiatal hernia. This includes:  Pregnancy.  Excess weight.  Frequent constipation. SIGNS AND SYMPTOMS  People with a hiatal hernia often have no symptoms. If symptoms develop, they are almost always caused by GERD. They may include:  Heartburn.  Belching.  Indigestion.  Trouble swallowing.  Coughing or wheezing.  Sore throat.  Hoarseness.  Chest pain. DIAGNOSIS  A hiatal hernia is sometimes found during an exam for another problem. Your  health care provider may suspect a hiatal hernia if you have symptoms of GERD. Tests may be done to diagnose GERD. These may include:  X-rays of your stomach or chest.  An upper gastrointestinal (GI) series. This is an X-ray exam of your GI tract involving the use of a chalky liquid that you swallow. The liquid shows up clearly on  the X-ray.  Endoscopy. This is a procedure to look into your stomach using a thin, flexible tube that has a tiny camera and light on the end of it. TREATMENT  If you have no symptoms, you may not need treatment. If you have symptoms, treatment may include:  Dietary and lifestyle changes to help reduce GERD symptoms.  Medicines. These may include:  Over-the-counter antacids.  Medicines that make your stomach empty more quickly.  Medicines that block the production of stomach acid (H2 blockers).  Stronger medicines to reduce stomach acid (proton pump inhibitors).  You may need surgery to repair the hernia if other treatments are not helping. HOME CARE INSTRUCTIONS   Take all medicines as directed by your health care provider.  Quit smoking, if you smoke.  Try to achieve and maintain a healthy body weight.  Eat frequent small meals instead of three large meals a day. This keeps your stomach from getting too full.  Eat slowly.  Do not lie down right after eating.  Do noteat 1-2 hours before bed.   Do not drink beverages with caffeine. These include cola, coffee, cocoa, and tea.  Do not drink alcohol.  Avoid foods that can make symptoms of GERD worse. These may include:  Fatty foods.  Citrus fruits.  Other foods and drinks that contain acid.  Avoid putting pressure on your belly. Anything that puts pressure on your belly increases the amount of acid that may be pushed up into your esophagus.   Avoid bending over, especially after eating.  Raise the head of your bed by putting blocks under the legs. This keeps your head and esophagus higher than your stomach.  Do not wear tight clothing around your chest or stomach.  Try not to strain when having a bowel movement, when urinating, or when lifting heavy objects. SEEK MEDICAL CARE IF:  Your symptoms are not controlled with medicines or lifestyle changes.  You are having trouble swallowing.  You have coughing  or wheezing that will not go away. SEEK IMMEDIATE MEDICAL CARE IF:  Your pain is getting worse.  Your pain spreads to your arms, neck, jaw, teeth, or back.  You have shortness of breath.  You sweat for no reason.  You feel sick to your stomach (nauseous) or vomit.  You vomit blood.  You have bright red blood in your stools.  You have black, tarry stools.  Document Released: 11/01/2003 Document Revised: 12/26/2013 Document Reviewed: 07/29/2013 [facility] Patient Information 2015 [place], [place]. This information is not intended to replace advice given to you by your health care provider. Make sure you discuss any questions you have with your health care provider.

## 2015-03-22 ENCOUNTER — Encounter: Payer: Self-pay | Admitting: *Deleted

## 2015-04-02 ENCOUNTER — Ambulatory Visit (INDEPENDENT_AMBULATORY_CARE_PROVIDER_SITE_OTHER): Payer: Commercial Managed Care - HMO | Admitting: Physician Assistant

## 2015-04-02 ENCOUNTER — Encounter: Payer: Self-pay | Admitting: Physician Assistant

## 2015-04-02 ENCOUNTER — Other Ambulatory Visit (INDEPENDENT_AMBULATORY_CARE_PROVIDER_SITE_OTHER): Payer: Commercial Managed Care - HMO

## 2015-04-02 VITALS — BP 116/70 | HR 76 | Ht 61.75 in | Wt 187.2 lb

## 2015-04-02 DIAGNOSIS — K219 Gastro-esophageal reflux disease without esophagitis: Secondary | ICD-10-CM | POA: Diagnosis not present

## 2015-04-02 DIAGNOSIS — R1013 Epigastric pain: Secondary | ICD-10-CM

## 2015-04-02 DIAGNOSIS — Z1211 Encounter for screening for malignant neoplasm of colon: Secondary | ICD-10-CM | POA: Diagnosis not present

## 2015-04-02 DIAGNOSIS — K30 Functional dyspepsia: Secondary | ICD-10-CM

## 2015-04-02 LAB — TSH: TSH: 3.08 u[IU]/mL (ref 0.35–4.50)

## 2015-04-02 MED ORDER — PANTOPRAZOLE SODIUM 40 MG PO TBEC: 40.0000 mg | DELAYED_RELEASE_TABLET | Freq: Two times a day (BID) | ORAL | Status: DC

## 2015-04-02 NOTE — Patient Instructions (Signed)
You will be contacted by [facility] to help you with the Cologuard  We have sent the following medications to your pharmacy for you to pick up at your convenience:  [facility] has requested that you go to the basement for the following lab work before leaving today:  TSH

## 2015-04-02 NOTE — Progress Notes (Signed)
Patient ID: Susan Contreras, female   DOB: Dec 16, 1940, 74 y.o.   MRN: [id number]   Subjective:    Patient ID: Susan Contreras, female    DOB: 1941-06-08, 74 y.o.   MRN: [id number]  HPI Susan Contreras is a pleasant 74 year old white female known to Dr. Henrene Pastor. She has history of GERD, hypertension, diverticulosis, she is status post cholecystectomy and has remote history of colon polyps. She last had EGD in 2007 which revealed an early stricture which was not dilated and a small hiatal hernia. Colonoscopy also in 2007 both by Dr. Marga Hoots showed hemorrhoids and diverticulosis, no polyps. Patient's history states that she has family history of colon cancer in her mother but she tells me her mother did not have colon cancer. She had a younger sister who died from cancer in her 81s but patient does not believe this was a very colon cancer but rather a pelvic malignancy. She was last seen in our office in October 2015 at that time complaining of intermittent dysphagia. She had been scheduled for an endoscopy and colonoscopy but did not follow through with either one of these. She comes back today in referral from her PCP with complaints of heartburn and indigestion. She says if she takes Exelon regularly she has diarrhea so she had stopped taking it regularly. She was most recently started on Protonix 40 mg once daily. She says if she takes it twice lately this seems to work better and occasionally has been using Protonix twice a day and addition to a DEXA want if she gets indigestion. Reactive episodes of heartburn and indigestion and occasional dysphagia to pills which is not on a regular basis. He is also complaining of some soreness on the left side of her neck which seems to, and go and some "glandular swelling on the left as well. She says she's had chronic problems with sinusitis. Patient is not anxious to have procedures done, she says she often wakes up feeling weak in the morning and is not sure that she can do a  prep at home and also is worried about transportation issues. She denies any problems with changes in her bowel habits melena or hematochezia.  Review of Systems Pertinent positive and negative review of systems were noted in the above HPI section.  All other review of systems was otherwise negative.  Outpatient Encounter Prescriptions as of 04/02/2015  Medication Sig  . ALPRAZolam (XANAX) 0.25 MG tablet Take 1 tablet (0.25 mg total) by mouth at bedtime as needed for anxiety.  Marland Kitchen dexlansoprazole (DEXILANT) 60 MG capsule Take 60 mg by mouth. At night as needed  . fluticasone (FLONASE) 50 MCG/ACT nasal spray USE ONE SPRAY IN EACH NOSTRIL EVERY DAY  . irbesartan (AVAPRO) 150 MG tablet TAKE 1 TABLET ONCE DAILY WITH BREAKFAST  . levothyroxine (SYNTHROID, LEVOTHROID) 50 MCG tablet Take 1 tablet (50 mcg total) by mouth daily.  . meclizine (ANTIVERT) 25 MG tablet TAKE ONE TABLET BY MOUTH THREE TIMES DAILY AS NEEDED FOR DIZZINESS  . pantoprazole (PROTONIX) 40 MG tablet Take 1 tablet (40 mg total) by mouth 2 (two) times daily.  . Vitamin D, Ergocalciferol, (DRISDOL) 50000 UNITS CAPS capsule Take 1 capsule (50,000 Units total) by mouth every 7 (seven) days.  Marland Kitchen VIVELLE-DOT 0.05 MG/24HR patch   . [DISCONTINUED] DEXILANT 60 MG capsule Take 1 capsule (60 mg total) by mouth daily. (Patient taking differently: Take 1 capsule (60 mg total) by mouth at night as needed)  . [DISCONTINUED] pantoprazole (PROTONIX) 40 MG  tablet Take 1 tablet by mouth 2 (two) times daily.   No facility-administered encounter medications on file as of 04/02/2015.   Allergies  Allergen Reactions  . Biaxin [Clarithromycin]   . Cephalexin   . Ciprofloxacin   . Doxycycline     Headaches  . Flagyl [Metronidazole Hcl]   . Ketek [Telithromycin]   . Sulfa Antibiotics    Patient Active Problem List   Diagnosis Date Noted  . Hypothyroidism 05/29/2014  . Cholelithiasis 01/15/2011  . ADENOMATOUS COLONIC POLYP 11/11/2007  . Essential  hypertension 11/11/2007  . ESOPHAGEAL STRICTURE 11/11/2007  . GERD 11/11/2007  . Diaphragmatic hernia 11/11/2007  . DIVERTICULOSIS OF COLON 11/11/2007   History   Social History  . Marital Status: Married    Spouse Name: N/A  . Number of Children: 2  . Years of Education: N/A   Occupational History  . Retired    Social History Main Topics  . Smoking status: Never Smoker   . Smokeless tobacco: Never Used  . Alcohol Use: No  . Drug Use: No  . Sexual Activity: Not on file   Other Topics Concern  . Not on file   Social History Narrative   2 caffeine drinks daily     Susan Contreras's family history includes Colon cancer in her sister; Heart disease in her mother; Ovarian cancer in her sister; Parkinson's disease in her father; Stroke in her mother. There is no history of Esophageal cancer, Stomach cancer, Kidney disease, Liver disease, or Diabetes.      Objective:    Filed Vitals:   04/02/15 1027  BP: 116/70  Pulse: 76    Physical Exam  well-developed older white female in no acute distress, pleasant blood pressure 116/70 pulse 76 height 5 foot 1 weight 187, BMI 34.5. HEENT; nontraumatic normocephalic EOMI PERRLA sclera anicteric, Supple no JVD, no palpable cervical adenopathy no palpable thyromegaly Cardiovascula;r regular rate and rhythm with S1-S2 no murmur or gallop, Pulmonary; clear bilaterally, Abdomen; soft nontender nondistended bowel sounds are active there is no palpable mass or hepatosplenomegaly, Rectal ;exam not done, Extremities; no clubbing cyanosis or edema skin warm and dry, Neuropsych ;mood and affect appropriate       Assessment & Plan:   #1 74 yo female with chronic GERD,recently poorly controlled. #2 Colon neoplasia screening- negative colon 2007- now relating no family hx of colon cancer, remote hx of adenomatous polyps- pt reluctant for colonoscopy  #3 s/p cholecystectomy  Plan; Increase Protonix to 40 mg po BID ac breakfast and ac dinner Discussed  Cologuard stool DNA testing for colon screening- she would like to pursue this and will ing to do a colonoscopy is returns positive.  TSH at pt request  Referred her back to PCP regarding concerns about thyroid and intermittent swollen nodes in neck     Kazimir Hartnett S Alannis Hsia PA-C 04/02/2015   Cc: [name], FNP

## 2015-04-04 ENCOUNTER — Telehealth: Payer: Self-pay | Admitting: Physician Assistant

## 2015-04-04 NOTE — Telephone Encounter (Signed)
OK,we discussed at length.

## 2015-04-04 NOTE — Telephone Encounter (Signed)
She has decided not to do the cologuard. She also declines colonoscopy.

## 2015-04-10 NOTE — Progress Notes (Signed)
Agree with initial assessment and plans as outlined 

## 2015-05-11 ENCOUNTER — Other Ambulatory Visit: Payer: Self-pay | Admitting: Family

## 2015-05-12 ENCOUNTER — Telehealth: Payer: Self-pay | Admitting: Gastroenterology

## 2015-05-12 NOTE — Telephone Encounter (Signed)
Patient called at 1145 PM stating she was having difficulty sleeping due to heartburn. She ate pizza prior to bedtime, she thinks triggering her symptoms. She has been on protonix 40mg  BID which has not been helping too much with her reflux. Recommend she try taking some Maalox or other OTC topical antacid to see if this helps. Recommended she sleep with head of bed elevated. Wanted to make you aware, she was wondering if someone could give her a call this week to reassess. Thanks

## 2015-05-14 NOTE — Telephone Encounter (Signed)
Beth- please call pt this week and see if heartburn sxs have subsided.... Please emphasize that she has significant reflux -that why she is on BID PPI- she should probably not be eating pizza at all, and should be NPO for 2-3 hours before bed, and elevate HOB if having frequent nighttime sxs

## 2015-05-15 NOTE — Telephone Encounter (Signed)
Patient reports some improvement. She had forgotten to take the Pantoprazole BID. Says symptoms do improve when she remembers to take it correctly. She watches her diet and avoids her triggers. She thinks she messed up by eating green peppers and onions. Information requested by the patient. I will mail her a pamphlet today on reflux.

## 2015-05-23 ENCOUNTER — Other Ambulatory Visit: Payer: Self-pay

## 2015-05-23 MED ORDER — MECLIZINE HCL 25 MG PO TABS: ORAL_TABLET | ORAL | Status: DC

## 2015-07-21 ENCOUNTER — Other Ambulatory Visit: Payer: Self-pay | Admitting: Family

## 2015-08-08 ENCOUNTER — Telehealth: Payer: Self-pay | Admitting: Internal Medicine

## 2015-08-08 MED ORDER — HYDROCORTISONE ACETATE 25 MG RE SUPP: 25.0000 mg | Freq: Two times a day (BID) | RECTAL | Status: DC

## 2015-08-08 NOTE — Telephone Encounter (Signed)
Patient says she is in a lot of pain

## 2015-08-08 NOTE — Telephone Encounter (Signed)
Pt aware and script sent to pharmacy. Pt instructed to call back if no improvement.

## 2015-08-08 NOTE — Telephone Encounter (Signed)
Pt states she has been having problems with her hemorrhoids. She has been soaking in the tub and using OTC supp. Pt is requesting something be called in for her hemorrhoids. Please advise.

## 2015-08-08 NOTE — Telephone Encounter (Signed)
Prescribe Anusol HC suppositories. If patient's pain persisted, it may be a complicated hemorrhoid (thrombosed), or not a hemorrhoid at all (fissure, abscess). If her discomfort persists or worsens she should be evaluated. Thanks

## 2015-08-14 ENCOUNTER — Encounter: Payer: Self-pay | Admitting: Family

## 2015-08-14 ENCOUNTER — Ambulatory Visit: Payer: Self-pay | Admitting: Family

## 2015-08-14 ENCOUNTER — Ambulatory Visit (INDEPENDENT_AMBULATORY_CARE_PROVIDER_SITE_OTHER): Payer: Commercial Managed Care - HMO | Admitting: Family

## 2015-08-14 VITALS — BP 108/61 | HR 69 | Temp 97.5°F | Ht 61.0 in | Wt 185.8 lb

## 2015-08-14 DIAGNOSIS — J309 Allergic rhinitis, unspecified: Secondary | ICD-10-CM | POA: Insufficient documentation

## 2015-08-14 DIAGNOSIS — F411 Generalized anxiety disorder: Secondary | ICD-10-CM | POA: Diagnosis not present

## 2015-08-14 DIAGNOSIS — E785 Hyperlipidemia, unspecified: Secondary | ICD-10-CM | POA: Diagnosis not present

## 2015-08-14 DIAGNOSIS — E039 Hypothyroidism, unspecified: Secondary | ICD-10-CM

## 2015-08-14 DIAGNOSIS — E559 Vitamin D deficiency, unspecified: Secondary | ICD-10-CM

## 2015-08-14 DIAGNOSIS — K219 Gastro-esophageal reflux disease without esophagitis: Secondary | ICD-10-CM | POA: Insufficient documentation

## 2015-08-14 DIAGNOSIS — K2100 Gastro-esophageal reflux disease with esophagitis, without bleeding: Secondary | ICD-10-CM

## 2015-08-14 DIAGNOSIS — I10 Essential (primary) hypertension: Secondary | ICD-10-CM | POA: Diagnosis not present

## 2015-08-14 MED ORDER — MONTELUKAST SODIUM 10 MG PO TABS: 10.0000 mg | ORAL_TABLET | Freq: Every day | ORAL | Status: DC

## 2015-08-14 MED ORDER — IRBESARTAN 75 MG PO TABS: 75.0000 mg | ORAL_TABLET | Freq: Every day | ORAL | Status: DC

## 2015-08-14 NOTE — Progress Notes (Signed)
Subjective:    Patient ID: Susan Contreras, female    DOB: [birth year], 74 y.o.   MRN: [id number]  PT presents to the office today for chronic follow up.  Hypertension This is a chronic problem. The current episode started more than 1 year ago. The problem has been resolved since onset. The problem is controlled. Pertinent negatives include no anxiety, headaches, palpitations, peripheral edema or shortness of breath. Risk factors for coronary artery disease include post-menopausal state, obesity and dyslipidemia. Past treatments include angiotensin blockers. The current treatment provides moderate improvement. Hypertensive end-organ damage includes a thyroid problem. There is no history of kidney disease, CAD/MI, CVA or heart failure. There is no history of sleep apnea.  Thyroid Problem Presents for follow-up visit. Symptoms include fatigue ("at times") and heat intolerance. Patient reports no depressed mood, diarrhea or palpitations. The symptoms have been improving. Past treatments include levothyroxine. The treatment provided moderate relief. There is no history of heart failure.  Gastroesophageal Reflux She reports no belching, no coughing, no heartburn or no sore throat. This is a chronic problem. The current episode started more than 1 year ago. The problem occurs rarely. The problem has been waxing and waning. The symptoms are aggravated by certain foods and lying down. Associated symptoms include fatigue ("at times"). She has tried a PPI for the symptoms. The treatment provided moderate relief.      Review of Systems  Constitutional: Positive for fatigue ("at times").  HENT: Negative.  Negative for sore throat.   Eyes: Negative.   Respiratory: Negative.  Negative for cough and shortness of breath.   Cardiovascular: Negative.  Negative for palpitations.  Gastrointestinal: Negative.  Negative for heartburn and diarrhea.  Endocrine: Positive for heat intolerance.  Genitourinary: Negative.     Musculoskeletal: Negative.   Neurological: Negative.  Negative for headaches.  Hematological: Negative.   Psychiatric/Behavioral: Negative.   All other systems reviewed and are negative.      Objective:   Physical Exam  Constitutional: She is oriented to person, place, and time. She appears well-developed and well-nourished. No distress.  HENT:  Head: Normocephalic and atraumatic.  Right Ear: External ear normal.  Left Ear: External ear normal.  Nose: Nose normal.  Mouth/Throat: Oropharynx is clear and moist.  Eyes: Pupils are equal, round, and reactive to light.  Neck: Normal range of motion. Neck supple. No thyromegaly present.  Cardiovascular: Normal rate, regular rhythm, normal heart sounds and intact distal pulses.   No murmur heard. Pulmonary/Chest: Effort normal and breath sounds normal. No respiratory distress. She has no wheezes.  Abdominal: Soft. Bowel sounds are normal. She exhibits no distension. There is no tenderness.  Musculoskeletal: Normal range of motion. She exhibits no edema or tenderness.  Neurological: She is alert and oriented to person, place, and time. She has normal reflexes. No cranial nerve deficit.  Skin: Skin is warm and dry.  Psychiatric: She has a normal mood and affect. Her behavior is normal. Judgment and thought content normal.  Vitals reviewed.     BP 108/61 mmHg  Pulse 69  Temp(Src) 97.5 F (36.4 C) (Oral)  Ht 5' 1"  (1.549 m)  Wt 185 lb 12.8 oz (84.278 kg)  BMI 35.12 kg/m2     Assessment & Plan:  1. Essential hypertension -Avapro decreased to 75 mg from 150 m-RTO in 2 weeks - CMP14+EGFR - irbesartan (AVAPRO) 75 MG tablet; Take 1 tablet (75 mg total) by mouth daily.  Dispense: 90 tablet; Refill: 1  2. Gastroesophageal reflux disease without  esophagitis - CMP14+EGFR  3. Hypothyroidism, unspecified hypothyroidism type - CMP14+EGFR - Thyroid Panel With TSH  4. GAD (generalized anxiety disorder) - CMP14+EGFR  5. Allergic  rhinitis, unspecified allergic rhinitis type -Pt started on Singulair today - CMP14+EGFR - montelukast (SINGULAIR) 10 MG tablet; Take 1 tablet (10 mg total) by mouth at bedtime.  Dispense: 90 tablet; Refill: 1  6. Gastroesophageal reflux disease with esophagitis - CMP14+EGFR  7. Hyperlipidemia - CMP14+EGFR - Lipid panel  8. Vitamin D deficiency - CMP14+EGFR - VITAMIN D 25 Hydroxy (Vit-D Deficiency, Fractures)   Continue all meds Labs pending Health Maintenance reviewed Diet and exercise encouraged RTO 2 weeks to recheck HTN  [name], FNP

## 2015-08-14 NOTE — Addendum Note (Signed)
Addended by: Earlene Plater on: 08/14/2015 09:53 AM   Modules accepted: Miquel Dunn

## 2015-08-14 NOTE — Addendum Note (Signed)
Addended by: Earlene Plater on: 08/14/2015 09:54 AM   Modules accepted: Miquel Dunn

## 2015-08-14 NOTE — Patient Instructions (Signed)
Health Maintenance, Female Adopting a healthy lifestyle and getting preventive care can go a long way to promote health and wellness. Talk with your health care provider about what schedule of regular examinations is right for you. This is a good chance for you to check in with your provider about disease prevention and staying healthy. In between checkups, there are plenty of things you can do on your own. Experts have done a lot of research about which lifestyle changes and preventive measures are most likely to keep you healthy. Ask your health care provider for more information. WEIGHT AND DIET  Eat a healthy diet  Be sure to include plenty of vegetables, fruits, low-fat dairy products, and lean protein.  Do not eat a lot of foods high in solid fats, added sugars, or salt.  Get regular exercise. This is one of the most important things you can do for your health.  Most adults should exercise for at least 150 minutes each week. The exercise should increase your heart rate and make you sweat (moderate-intensity exercise).  Most adults should also do strengthening exercises at least twice a week. This is in addition to the moderate-intensity exercise.  Maintain a healthy weight  Body mass index (BMI) is a measurement that can be used to identify possible weight problems. It estimates body fat based on height and weight. Your health care provider can help determine your BMI and help you achieve or maintain a healthy weight.  For females 20 years of age and older:   A BMI below 18.5 is considered underweight.  A BMI of 18.5 to 24.9 is normal.  A BMI of 25 to 29.9 is considered overweight.  A BMI of 30 and above is considered obese.  Watch levels of cholesterol and blood lipids  You should start having your blood tested for lipids and cholesterol at 72 years of age, then have this test every 5 years.  You may need to have your cholesterol levels checked more often if:  Your lipid  or cholesterol levels are high.  You are older than 72 years of age.  You are at high risk for heart disease.  CANCER SCREENING   Lung Cancer  Lung cancer screening is recommended for adults 55-80 years old who are at high risk for lung cancer because of a history of smoking.  A yearly low-dose CT scan of the lungs is recommended for people who:  Currently smoke.  Have quit within the past 15 years.  Have at least a 30-pack-year history of smoking. A pack year is smoking an average of one pack of cigarettes a day for 1 year.  Yearly screening should continue until it has been 15 years since you quit.  Yearly screening should stop if you develop a health problem that would prevent you from having lung cancer treatment.  Breast Cancer  Practice breast self-awareness. This means understanding how your breasts normally appear and feel.  It also means doing regular breast self-exams. Let your health care provider know about any changes, no matter how small.  If you are in your 20s or 30s, you should have a clinical breast exam (CBE) by a health care provider every 1-3 years as part of a regular health exam.  If you are 40 or older, have a CBE every year. Also consider having a breast X-ray (mammogram) every year.  If you have a family history of breast cancer, talk to your health care provider about genetic screening.  If you   are at high risk for breast cancer, talk to your health care provider about having an MRI and a mammogram every year.  Breast cancer gene (BRCA) assessment is recommended for women who have family members with BRCA-related cancers. BRCA-related cancers include:  Breast.  Ovarian.  Tubal.  Peritoneal cancers.  Results of the assessment will determine the need for genetic counseling and BRCA1 and BRCA2 testing. Cervical Cancer Your health care provider may recommend that you be screened regularly for cancer of the pelvic organs (ovaries, uterus, and  vagina). This screening involves a pelvic examination, including checking for microscopic changes to the surface of your cervix (Pap test). You may be encouraged to have this screening done every 3 years, beginning at age 21.  For women ages 30-65, health care providers may recommend pelvic exams and Pap testing every 3 years, or they may recommend the Pap and pelvic exam, combined with testing for human papilloma virus (HPV), every 5 years. Some types of HPV increase your risk of cervical cancer. Testing for HPV may also be done on women of any age with unclear Pap test results.  Other health care providers may not recommend any screening for nonpregnant women who are considered low risk for pelvic cancer and who do not have symptoms. Ask your health care provider if a screening pelvic exam is right for you.  If you have had past treatment for cervical cancer or a condition that could lead to cancer, you need Pap tests and screening for cancer for at least 20 years after your treatment. If Pap tests have been discontinued, your risk factors (such as having a new sexual partner) need to be reassessed to determine if screening should resume. Some women have medical problems that increase the chance of getting cervical cancer. In these cases, your health care provider may recommend more frequent screening and Pap tests. Colorectal Cancer  This type of cancer can be detected and often prevented.  Routine colorectal cancer screening usually begins at 72 years of age and continues through 72 years of age.  Your health care provider may recommend screening at an earlier age if you have risk factors for colon cancer.  Your health care provider may also recommend using home test kits to check for hidden blood in the stool.  A small camera at the end of a tube can be used to examine your colon directly (sigmoidoscopy or colonoscopy). This is done to check for the earliest forms of colorectal  cancer.  Routine screening usually begins at age 50.  Direct examination of the colon should be repeated every 5-10 years through 72 years of age. However, you may need to be screened more often if early forms of precancerous polyps or small growths are found. Skin Cancer  Check your skin from head to toe regularly.  Tell your health care provider about any new moles or changes in moles, especially if there is a change in a mole's shape or color.  Also tell your health care provider if you have a mole that is larger than the size of a pencil eraser.  Always use sunscreen. Apply sunscreen liberally and repeatedly throughout the day.  Protect yourself by wearing long sleeves, pants, a wide-brimmed hat, and sunglasses whenever you are outside. HEART DISEASE, DIABETES, AND HIGH BLOOD PRESSURE   High blood pressure causes heart disease and increases the risk of stroke. High blood pressure is more likely to develop in:  People who have blood pressure in the high end   of the normal range (130-139/85-89 mm Hg).  People who are overweight or obese.  People who are African American.  If you are 38-23 years of age, have your blood pressure checked every 3-5 years. If you are 61 years of age or older, have your blood pressure checked every year. You should have your blood pressure measured twice--once when you are at a hospital or clinic, and once when you are not at a hospital or clinic. Record the average of the two measurements. To check your blood pressure when you are not at a hospital or clinic, you can use:  An automated blood pressure machine at a pharmacy.  A home blood pressure monitor.  If you are between 45 years and 39 years old, ask your health care provider if you should take aspirin to prevent strokes.  Have regular diabetes screenings. This involves taking a blood sample to check your fasting blood sugar level.  If you are at a normal weight and have a low risk for diabetes,  have this test once every three years after 72 years of age.  If you are overweight and have a high risk for diabetes, consider being tested at a younger age or more often. PREVENTING INFECTION  Hepatitis B  If you have a higher risk for hepatitis B, you should be screened for this virus. You are considered at high risk for hepatitis B if:  You were born in a country where hepatitis B is common. Ask your health care provider which countries are considered high risk.  Your parents were born in a high-risk country, and you have not been immunized against hepatitis B (hepatitis B vaccine).  You have HIV or AIDS.  You use needles to inject street drugs.  You live with someone who has hepatitis B.  You have had sex with someone who has hepatitis B.  You get hemodialysis treatment.  You take certain medicines for conditions, including cancer, organ transplantation, and autoimmune conditions. Hepatitis C  Blood testing is recommended for:  Everyone born from [birth year] through 1963.  Anyone with known risk factors for hepatitis C. Sexually transmitted infections (STIs)  You should be screened for sexually transmitted infections (STIs) including gonorrhea and chlamydia if:  You are sexually active and are younger than 72 years of age.  You are older than 72 years of age and your health care provider tells you that you are at risk for this type of infection.  Your sexual activity has changed since you were last screened and you are at an increased risk for chlamydia or gonorrhea. Ask your health care provider if you are at risk.  If you do not have HIV, but are at risk, it may be recommended that you take a prescription medicine daily to prevent HIV infection. This is called pre-exposure prophylaxis (PrEP). You are considered at risk if:  You are sexually active and do not regularly use condoms or know the HIV status of your partner(s).  You take drugs by injection.  You are sexually  active with a partner who has HIV. Talk with your health care provider about whether you are at high risk of being infected with HIV. If you choose to begin PrEP, you should first be tested for HIV. You should then be tested every 3 months for as long as you are taking PrEP.  PREGNANCY   If you are premenopausal and you may become pregnant, ask your health care provider about preconception counseling.  If you may  become pregnant, take 400 to 800 micrograms (mcg) of folic acid every day.  If you want to prevent pregnancy, talk to your health care provider about birth control (contraception). OSTEOPOROSIS AND MENOPAUSE   Osteoporosis is a disease in which the bones lose minerals and strength with aging. This can result in serious bone fractures. Your risk for osteoporosis can be identified using a bone density scan.  If you are 61 years of age or older, or if you are at risk for osteoporosis and fractures, ask your health care provider if you should be screened.  Ask your health care provider whether you should take a calcium or vitamin D supplement to lower your risk for osteoporosis.  Menopause may have certain physical symptoms and risks.  Hormone replacement therapy may reduce some of these symptoms and risks. Talk to your health care provider about whether hormone replacement therapy is right for you.  HOME CARE INSTRUCTIONS   Schedule regular health, dental, and eye exams.  Stay current with your immunizations.   Do not use any tobacco products including cigarettes, chewing tobacco, or electronic cigarettes.  If you are pregnant, do not drink alcohol.  If you are breastfeeding, limit how much and how often you drink alcohol.  Limit alcohol intake to no more than 1 drink per day for nonpregnant women. One drink equals 12 ounces of beer, 5 ounces of wine, or 1 ounces of hard liquor.  Do not use street drugs.  Do not share needles.  Ask your health care provider for help if  you need support or information about quitting drugs.  Tell your health care provider if you often feel depressed.  Tell your health care provider if you have ever been abused or do not feel safe at home.   This information is not intended to replace advice given to you by your health care provider. Make sure you discuss any questions you have with your health care provider.   Document Released: 02/24/2011 Document Revised: 09/01/2014 Document Reviewed: 07/13/2013 Elsevier Interactive Patient Education [employer].

## 2015-08-15 ENCOUNTER — Telehealth: Payer: Self-pay | Admitting: Family

## 2015-08-15 ENCOUNTER — Other Ambulatory Visit: Payer: Self-pay | Admitting: Family

## 2015-08-15 ENCOUNTER — Telehealth: Payer: Self-pay | Admitting: *Deleted

## 2015-08-15 DIAGNOSIS — E875 Hyperkalemia: Secondary | ICD-10-CM

## 2015-08-15 DIAGNOSIS — E781 Pure hyperglyceridemia: Secondary | ICD-10-CM

## 2015-08-15 LAB — CMP14+EGFR
ALBUMIN: 4.3 g/dL (ref 3.5–4.8)
ALT: 11 IU/L (ref 0–32)
AST: 20 IU/L (ref 0–40)
Albumin/Globulin Ratio: 1.3 (ref 1.1–2.5)
Alkaline Phosphatase: 84 IU/L (ref 39–117)
BUN/Creatinine Ratio: 8 — ABNORMAL LOW (ref 11–26)
BUN: 11 mg/dL (ref 8–27)
Bilirubin Total: 0.5 mg/dL (ref 0.0–1.2)
CO2: 22 mmol/L (ref 18–29)
Calcium: 9.5 mg/dL (ref 8.7–10.3)
Chloride: 102 mmol/L (ref 96–106)
Creatinine, Ser: 1.43 mg/dL — ABNORMAL HIGH (ref 0.57–1.00)
GFR calc non Af Amer: 37 mL/min/{1.73_m2} — ABNORMAL LOW (ref >59)
GFR, EST AFRICAN AMERICAN: 42 mL/min/{1.73_m2} — AB (ref >59)
GLUCOSE: 90 mg/dL (ref 65–99)
Globulin, Total: 3.4 g/dL (ref 1.5–4.5)
POTASSIUM: 5.5 mmol/L — AB (ref 3.5–5.2)
Sodium: 141 mmol/L (ref 134–144)
Total Protein: 7.7 g/dL (ref 6.0–8.5)

## 2015-08-15 LAB — THYROID PANEL WITH TSH
Free Thyroxine Index: 2.4 (ref 1.2–4.9)
T3 Uptake Ratio: 26 % (ref 24–39)
T4, Total: 9.2 ug/dL (ref 4.5–12.0)
TSH: 3.68 u[IU]/mL (ref 0.450–4.500)

## 2015-08-15 LAB — LIPID PANEL
CHOL/HDL RATIO: 6.2 ratio — AB (ref 0.0–4.4)
Cholesterol, Total: 248 mg/dL — ABNORMAL HIGH (ref 100–199)
HDL: 40 mg/dL (ref >39)
Triglycerides: 450 mg/dL — ABNORMAL HIGH (ref 0–149)

## 2015-08-15 LAB — VITAMIN D 25 HYDROXY (VIT D DEFICIENCY, FRACTURES): Vit D, 25-Hydroxy: 18.7 ng/mL — ABNORMAL LOW (ref 30.0–100.0)

## 2015-08-15 MED ORDER — ATORVASTATIN CALCIUM 40 MG PO TABS: 40.0000 mg | ORAL_TABLET | Freq: Every day | ORAL | Status: DC

## 2015-08-15 NOTE — Telephone Encounter (Signed)
Spoke with pt regarding Atorvastatin She does not want to take a statin She will try diet and exercise Pt notified of results Verbalizes understanding

## 2015-08-15 NOTE — Telephone Encounter (Signed)
-----   Message from [name], [place] sent at 08/15/2015  8:26 AM EST ----- Kidney and liver function stable- No NSAID"S Potassium elevated- PT needs BMP redrawn, Limit rich K+ foods Triglycerides extremely elevated- Lipitor 40 mg Prescription sent to pharmacy, Pt needs to be on low fat diet, Pt needs follow up in 3 months Thyroid levels WNL Vit D levels low- Pt can start Vit D OTC

## 2015-08-15 NOTE — Telephone Encounter (Signed)
Pt notified of results Verbalizes understanding 

## 2015-08-16 NOTE — Telephone Encounter (Signed)
Medicare does not pay for blood pressure monitor

## 2015-08-16 NOTE — Telephone Encounter (Signed)
Pt aware  - she will purchase a BP machine OTC copy of labs sent to pt

## 2015-09-21 ENCOUNTER — Ambulatory Visit (INDEPENDENT_AMBULATORY_CARE_PROVIDER_SITE_OTHER): Payer: Commercial Managed Care - HMO

## 2015-09-21 ENCOUNTER — Ambulatory Visit (INDEPENDENT_AMBULATORY_CARE_PROVIDER_SITE_OTHER): Payer: Commercial Managed Care - HMO | Admitting: Family

## 2015-09-21 ENCOUNTER — Encounter: Payer: Self-pay | Admitting: Family

## 2015-09-21 VITALS — BP 123/64 | HR 74 | Temp 97.0°F | Ht 61.0 in | Wt 188.0 lb

## 2015-09-21 DIAGNOSIS — Z780 Asymptomatic menopausal state: Secondary | ICD-10-CM

## 2015-09-21 DIAGNOSIS — I10 Essential (primary) hypertension: Secondary | ICD-10-CM

## 2015-09-21 DIAGNOSIS — E782 Mixed hyperlipidemia: Secondary | ICD-10-CM | POA: Diagnosis not present

## 2015-09-21 DIAGNOSIS — N951 Menopausal and female climacteric states: Secondary | ICD-10-CM | POA: Diagnosis not present

## 2015-09-21 DIAGNOSIS — E8881 Metabolic syndrome: Secondary | ICD-10-CM

## 2015-09-21 DIAGNOSIS — R232 Flushing: Secondary | ICD-10-CM

## 2015-09-21 MED ORDER — ESTRADIOL 0.05 MG/24HR TD PTTW
1.0000 | MEDICATED_PATCH | TRANSDERMAL | Status: DC
Start: 2015-09-21 — End: 2016-06-04

## 2015-09-21 NOTE — Patient Instructions (Signed)

## 2015-09-21 NOTE — Addendum Note (Signed)
Addended by: Earlene Plater on: 09/21/2015 12:22 PM   Modules accepted: Miquel Dunn

## 2015-09-21 NOTE — Progress Notes (Signed)
Subjective:    Patient ID: Susan Contreras, female    DOB: [birth year], 75 y.o.   MRN: [id number]  Pt presents to the office today to recheck HTN. PT's BP is at goal today!! PT states she is having hot flashes and is requesting her Vivelle-Dot to be refilled today. Pt denies any personal history or family history of breast, cervical, or endometrial cancer. PT states she is scheduled for her mammogram on Tuesday. Hypertension This is a chronic problem. The current episode started more than 1 year ago. The problem has been resolved since onset. The problem is controlled. Pertinent negatives include no anxiety, headaches, palpitations, peripheral edema, shortness of breath or sweats. Risk factors for coronary artery disease include dyslipidemia, obesity, post-menopausal state and sedentary lifestyle. Past treatments include angiotensin blockers. The current treatment provides significant improvement. There is no history of kidney disease, CAD/MI, CVA, heart failure or a thyroid problem. There is no history of sleep apnea.  Hyperlipidemia This is a chronic problem. The current episode started more than 1 year ago. The problem is uncontrolled. Recent lipid tests were reviewed and are high. She has no history of liver disease. Pertinent negatives include no shortness of breath. Current antihyperlipidemic treatment includes diet change. The current treatment provides no improvement of lipids. Compliance problems include adherence to diet and adherence to exercise.  Risk factors for coronary artery disease include dyslipidemia, hypertension, obesity and post-menopausal.      Review of Systems  Constitutional: Negative.   HENT: Negative.   Eyes: Negative.   Respiratory: Negative.  Negative for shortness of breath.   Cardiovascular: Negative.  Negative for palpitations.  Gastrointestinal: Negative.   Endocrine: Negative.   Genitourinary: Negative.   Musculoskeletal: Negative.   Neurological: Negative.   Negative for headaches.  Hematological: Negative.   Psychiatric/Behavioral: Negative.   All other systems reviewed and are negative.      Objective:   Physical Exam  Constitutional: She is oriented to person, place, and time. She appears well-developed and well-nourished. No distress.  HENT:  Head: Normocephalic and atraumatic.  Eyes: Pupils are equal, round, and reactive to light.  Neck: Normal range of motion. Neck supple. No thyromegaly present.  Cardiovascular: Normal rate, regular rhythm, normal heart sounds and intact distal pulses.   No murmur heard. Pulmonary/Chest: Effort normal and breath sounds normal. No respiratory distress. She has no wheezes.  Abdominal: Soft. Bowel sounds are normal. She exhibits no distension. There is no tenderness.  Musculoskeletal: Normal range of motion. She exhibits no edema or tenderness.  Neurological: She is alert and oriented to person, place, and time. She has normal reflexes. No cranial nerve deficit.  Skin: Skin is warm and dry.  Psychiatric: She has a normal mood and affect. Her behavior is normal. Judgment and thought content normal.  Vitals reviewed.     BP 123/64 mmHg  Pulse 74  Temp(Src) 97 F (36.1 C) (Oral)  Ht 5' 1"  (1.549 m)  Wt 188 lb (85.276 kg)  BMI 35.54 kg/m2     Assessment & Plan:  1. Essential hypertension -PT's at goal today -Daily blood pressure log given with instructions on how to fill out and told to bring to next visit -Dash diet information given -Exercise encouraged - Stress Management  -Continue current meds -RTO in 6 months - BMP8+EGFR  2. Post-menopausal - BMP8+EGFR - DG Bone Density; Future  3. Hot flashes -Pt to send mammogram results to [place] - BMP8+EGFR - estradiol (VIVELLE-DOT) 0.05 MG/24HR patch; Place 1 patch (  0.05 mg total) onto the skin 2 (two) times a week. Reported on 09/21/2015  Dispense: 8 patch; Refill: 2  4. Metabolic syndrome  5. Elevated triglycerides with high  cholesterol -Long discussed of the importance of cholesterol medications and risks associated with hyperlipemia and elevated triglycerides -Pt would like to try low fat diet and does not want to be on statin at this time  [name], FNP

## 2015-09-22 LAB — BMP8+EGFR
BUN / CREAT RATIO: 8 — AB (ref 11–26)
BUN: 11 mg/dL (ref 8–27)
CO2: 20 mmol/L (ref 18–29)
CREATININE: 1.39 mg/dL — AB (ref 0.57–1.00)
Calcium: 9 mg/dL (ref 8.7–10.3)
Chloride: 98 mmol/L (ref 96–106)
GFR calc Af Amer: 44 mL/min/{1.73_m2} — ABNORMAL LOW (ref >59)
GFR, EST NON AFRICAN AMERICAN: 38 mL/min/{1.73_m2} — AB (ref >59)
Glucose: 91 mg/dL (ref 65–99)
Potassium: 4.5 mmol/L (ref 3.5–5.2)
SODIUM: 137 mmol/L (ref 134–144)

## 2015-09-25 DIAGNOSIS — Z1231 Encounter for screening mammogram for malignant neoplasm of breast: Secondary | ICD-10-CM | POA: Diagnosis not present

## 2015-10-16 ENCOUNTER — Encounter: Payer: Self-pay | Admitting: Family

## 2015-10-16 ENCOUNTER — Ambulatory Visit (INDEPENDENT_AMBULATORY_CARE_PROVIDER_SITE_OTHER): Payer: Commercial Managed Care - HMO | Admitting: Family

## 2015-10-16 ENCOUNTER — Other Ambulatory Visit: Payer: Self-pay | Admitting: Family

## 2015-10-16 VITALS — BP 140/78 | HR 78 | Ht 61.0 in | Wt 186.8 lb

## 2015-10-16 DIAGNOSIS — K2100 Gastro-esophageal reflux disease with esophagitis, without bleeding: Secondary | ICD-10-CM

## 2015-10-16 DIAGNOSIS — R109 Unspecified abdominal pain: Secondary | ICD-10-CM

## 2015-10-16 DIAGNOSIS — K5900 Constipation, unspecified: Secondary | ICD-10-CM | POA: Diagnosis not present

## 2015-10-16 DIAGNOSIS — K222 Esophageal obstruction: Secondary | ICD-10-CM

## 2015-10-16 LAB — POCT URINALYSIS DIPSTICK
BILIRUBIN UA: NEGATIVE
GLUCOSE UA: NEGATIVE
KETONES UA: NEGATIVE
Nitrite, UA: NEGATIVE
Protein, UA: NEGATIVE
Spec Grav, UA: 1.01
Urobilinogen, UA: NEGATIVE
pH, UA: 5

## 2015-10-16 LAB — POCT UA - MICROSCOPIC ONLY
CRYSTALS, UR, HPF, POC: NEGATIVE
Casts, Ur, LPF, POC: NEGATIVE
Mucus, UA: NEGATIVE
Yeast, UA: NEGATIVE

## 2015-10-16 MED ORDER — ATORVASTATIN CALCIUM 20 MG PO TABS: 20.0000 mg | ORAL_TABLET | Freq: Every day | ORAL | Status: DC

## 2015-10-16 MED ORDER — NITROFURANTOIN MONOHYD MACRO 100 MG PO CAPS: 100.0000 mg | ORAL_CAPSULE | Freq: Two times a day (BID) | ORAL | Status: DC

## 2015-10-16 NOTE — Patient Instructions (Addendum)
Gastroesophageal Reflux Disease, Adult Normally, food travels down the esophagus and stays in the stomach to be digested. However, when a person has gastroesophageal reflux disease (GERD), food and stomach acid move back up into the esophagus. When this happens, the esophagus becomes sore and inflamed. Over time, GERD can create small holes (ulcers) in the lining of the esophagus.  CAUSES This condition is caused by a problem with the muscle between the esophagus and the stomach (lower esophageal sphincter, or LES). Normally, the LES muscle closes after food passes through the esophagus to the stomach. When the LES is weakened or abnormal, it does not close properly, and that allows food and stomach acid to go back up into the esophagus. The LES can be weakened by certain dietary substances, medicines, and medical conditions, including:  Tobacco use.  Pregnancy.  Having a hiatal hernia.  Heavy alcohol use.  Certain foods and beverages, such as coffee, chocolate, onions, and peppermint. RISK FACTORS This condition is more likely to develop in:  People who have an increased body weight.  People who have connective tissue disorders.  People who use NSAID medicines. SYMPTOMS Symptoms of this condition include:  Heartburn.  Difficult or painful swallowing.  The feeling of having a lump in the throat.  Abitter taste in the mouth.  Bad breath.  Having a large amount of saliva.  Having an upset or bloated stomach.  Belching.  Chest pain.  Shortness of breath or wheezing.  Ongoing (chronic) cough or a night-time cough.  Wearing away of tooth enamel.  Weight loss. Different conditions can cause chest pain. Make sure to see your health care provider if you experience chest pain. DIAGNOSIS Your health care provider will take a medical history and perform a physical exam. To determine if you have mild or severe GERD, your health care provider may also monitor how you respond  to treatment. You may also have other tests, including:  An endoscopy toexamine your stomach and esophagus with a small camera.  A test thatmeasures the acidity level in your esophagus.  A test thatmeasures how much pressure is on your esophagus.  A barium swallow or modified barium swallow to show the shape, size, and functioning of your esophagus. TREATMENT The goal of treatment is to help relieve your symptoms and to prevent complications. Treatment for this condition may vary depending on how severe your symptoms are. Your health care provider may recommend:  Changes to your diet.  Medicine.  Surgery. HOME CARE INSTRUCTIONS Diet  Follow a diet as recommended by your health care provider. This may involve avoiding foods and drinks such as:  Coffee and tea (with or without caffeine).  Drinks that containalcohol.  Energy drinks and sports drinks.  Carbonated drinks or sodas.  Chocolate and cocoa.  Peppermint and mint flavorings.  Garlic and onions.  Horseradish.  Spicy and acidic foods, including peppers, chili powder, curry powder, vinegar, hot sauces, and barbecue sauce.  Citrus fruit juices and citrus fruits, such as oranges, lemons, and limes.  Tomato-based foods, such as red sauce, chili, salsa, and pizza with red sauce.  Fried and fatty foods, such as donuts, french fries, potato chips, and high-fat dressings.  High-fat meats, such as hot dogs and fatty cuts of red and white meats, such as rib eye steak, sausage, ham, and bacon.  High-fat dairy items, such as whole milk, butter, and cream cheese.  Eat small, frequent meals instead of large meals.  Avoid drinking large amounts of liquid with your   meals.  Avoid eating meals during the 2-3 hours before bedtime.  Avoid lying down right after you eat.  Do not exercise right after you eat. General Instructions  Pay attention to any changes in your symptoms.  Take over-the-counter and prescription  medicines only as told by your health care provider. Do not take aspirin, ibuprofen, or other NSAIDs unless your health care provider told you to do so.  Do not use any tobacco products, including cigarettes, chewing tobacco, and e-cigarettes. If you need help quitting, ask your health care provider.  Wear loose-fitting clothing. Do not wear anything tight around your waist that causes pressure on your abdomen.  Raise (elevate) the head of your bed 6 inches (15cm).  Try to reduce your stress, such as with yoga or meditation. If you need help reducing stress, ask your health care provider.  If you are overweight, reduce your weight to an amount that is healthy for you. Ask your health care provider for guidance about a safe weight loss goal.  Keep all follow-up visits as told by your health care provider. This is important. SEEK MEDICAL CARE IF:  You have new symptoms.  You have unexplained weight loss.  You have difficulty swallowing, or it hurts to swallow.  You have wheezing or a persistent cough.  Your symptoms do not improve with treatment.  You have a hoarse voice. SEEK IMMEDIATE MEDICAL CARE IF:  You have pain in your arms, neck, jaw, teeth, or back.  You feel sweaty, dizzy, or light-headed.  You have chest pain or shortness of breath.  You vomit and your vomit looks like blood or coffee grounds.  You faint.  Your stool is bloody or black.  You cannot swallow, drink, or eat.   This information is not intended to replace advice given to you by your health care provider. Make sure you discuss any questions you have with your health care provider.   Document Released: 05/21/2005 Document Revised: 05/02/2015 Document Reviewed: 12/06/2014 Elsevier Interactive Patient Education 2016 Elsevier Inc[employer] Food Choices for Gastroesophageal Reflux Disease, Adult When you have gastroesophageal reflux disease (GERD), the foods you eat and your eating habits are very important.  Choosing the right foods can help ease the discomfort of GERD. WHAT GENERAL GUIDELINES DO I NEED TO FOLLOW?  Choose fruits, vegetables, whole grains, low-fat dairy products, and low-fat meat, fish, and poultry.  Limit fats such as oils, salad dressings, butter, nuts, and avocado.  Keep a food diary to identify foods that cause symptoms.  Avoid foods that cause reflux. These may be different for different people.  Eat frequent small meals instead of three large meals each day.  Eat your meals slowly, in a relaxed setting.  Limit fried foods.  Cook foods using methods other than frying.  Avoid drinking alcohol.  Avoid drinking large amounts of liquids with your meals.  Avoid bending over or lying down until 2-3 hours after eating. WHAT FOODS ARE NOT RECOMMENDED? The following are some foods and drinks that may worsen your symptoms: Vegetables Tomatoes. Tomato juice. Tomato and spaghetti sauce. Chili peppers. Onion and garlic. Horseradish. Fruits Oranges, grapefruit, and lemon (fruit and juice). Meats High-fat meats, fish, and poultry. This includes hot dogs, ribs, ham, sausage, salami, and bacon. Dairy Whole milk and chocolate milk. Sour cream. Cream. Butter. Ice cream. Cream cheese.  Beverages Coffee and tea, with or without caffeine. Carbonated beverages or energy drinks. Condiments Hot sauce. Barbecue sauce.  Sweets/Desserts Chocolate and cocoa. Donuts. Peppermint and spearmint.   Fats and Oils High-fat foods, including [place] fries and potato chips. Other Vinegar. Strong spices, such as black pepper, white pepper, red pepper, cayenne, curry powder, cloves, ginger, and chili powder. The items listed above may not be a complete list of foods and beverages to avoid. Contact your dietitian for more information.   This information is not intended to replace advice given to you by your health care provider. Make sure you discuss any questions you have with your health care  provider.   Document Released: 08/11/2005 Document Revised: 09/01/2014 Document Reviewed: 06/15/2013 Elsevier Interactive Patient Education 2016 [employer]. Constipation, Adult Constipation is when a person has fewer than three bowel movements a week, has difficulty having a bowel movement, or has stools that are dry, hard, or larger than normal. As people grow older, constipation is more common. A low-fiber diet, not taking in enough fluids, and taking certain medicines may make constipation worse.  CAUSES   Certain medicines, such as antidepressants, pain medicine, iron supplements, antacids, and water pills.   Certain diseases, such as diabetes, irritable bowel syndrome (IBS), thyroid disease, or depression.   Not drinking enough water.   Not eating enough fiber-rich foods.   Stress or travel.   Lack of physical activity or exercise.   Ignoring the urge to have a bowel movement.   Using laxatives too much.  SIGNS AND SYMPTOMS   Having fewer than three bowel movements a week.   Straining to have a bowel movement.   Having stools that are hard, dry, or larger than normal.   Feeling full or bloated.   Pain in the lower abdomen.   Not feeling relief after having a bowel movement.  DIAGNOSIS  Your health care provider will take a medical history and perform a physical exam. Further testing may be done for severe constipation. Some tests may include:  A barium enema X-ray to examine your rectum, colon, and, sometimes, your small intestine.   A sigmoidoscopy to examine your lower colon.   A colonoscopy to examine your entire colon. TREATMENT  Treatment will depend on the severity of your constipation and what is causing it. Some dietary treatments include drinking more fluids and eating more fiber-rich foods. Lifestyle treatments may include regular exercise. If these diet and lifestyle recommendations do not help, your health care provider may recommend  taking over-the-counter laxative medicines to help you have bowel movements. Prescription medicines may be prescribed if over-the-counter medicines do not work.  HOME CARE INSTRUCTIONS   Eat foods that have a lot of fiber, such as fruits, vegetables, whole grains, and beans.  Limit foods high in fat and processed sugars, such as french fries, hamburgers, cookies, candies, and soda.   A fiber supplement may be added to your diet if you cannot get enough fiber from foods.   Drink enough fluids to keep your urine clear or pale yellow.   Exercise regularly or as directed by your health care provider.   Go to the restroom when you have the urge to go. Do not hold it.   Only take over-the-counter or prescription medicines as directed by your health care provider. Do not take other medicines for constipation without talking to your health care provider first.  [place] IF:   You have bright red blood in your stool.   Your constipation lasts for more than 4 days or gets worse.   You have abdominal or rectal pain.   You have thin, pencil-like stools.  You have unexplained weight loss. MAKE SURE YOU:   Understand these instructions.  Will watch your condition.  Will get help right away if you are not doing well or get worse.   This information is not intended to replace advice given to you by your health care provider. Make sure you discuss any questions you have with your health care provider.   Document Released: 05/09/2004 Document Revised: 09/01/2014 Document Reviewed: 05/23/2013 Elsevier Interactive Patient Education [employer].

## 2015-10-16 NOTE — Progress Notes (Signed)
   Subjective:    Patient ID: Susan Contreras, female    DOB: 1941/07/11, 75 y.o.   MRN: [id number]  PT presents to the office with bilateral side pain and states she is having "bad heart burn". PT is also questioning why she is on Lipitor. Gastroesophageal Reflux She complains of belching, coughing and heartburn. She reports no dysphagia, no nausea, no sore throat or no wheezing. This is a chronic problem. The current episode started more than 1 year ago. The problem has been waxing and waning. The symptoms are aggravated by certain foods. Risk factors include obesity. She has tried a PPI for the symptoms. The treatment provided mild relief.  Constipation This is a new problem. The current episode started 1 to 4 weeks ago. The problem has been waxing and waning since onset. Her stool frequency is 4 to 5 times per week. The stool is described as formed and firm. The patient is on a high fiber diet. She does not exercise regularly. There has been adequate water intake. Pertinent negatives include no nausea. Risk factors include dietary change. She has tried diet changes for the symptoms. The treatment provided mild relief.      Review of Systems  Constitutional: Negative.   HENT: Negative.  Negative for sore throat.   Eyes: Negative.   Respiratory: Positive for cough. Negative for shortness of breath and wheezing.   Cardiovascular: Negative.  Negative for palpitations.  Gastrointestinal: Positive for heartburn and constipation. Negative for dysphagia and nausea.  Endocrine: Negative.   Genitourinary: Negative.   Musculoskeletal: Negative.   Neurological: Negative.  Negative for headaches.  Hematological: Negative.   Psychiatric/Behavioral: Negative.   All other systems reviewed and are negative.      Objective:   Physical Exam  Constitutional: She is oriented to person, place, and time. She appears well-developed and well-nourished. No distress.  HENT:  Head: Normocephalic and  atraumatic.  Eyes: Pupils are equal, round, and reactive to light.  Neck: Normal range of motion. Neck supple. No thyromegaly present.  Cardiovascular: Normal rate, regular rhythm, normal heart sounds and intact distal pulses.   No murmur heard. Pulmonary/Chest: Effort normal and breath sounds normal. No respiratory distress. She has no wheezes.  Abdominal: Soft. Bowel sounds are normal. She exhibits no distension. There is no tenderness.  Musculoskeletal: Normal range of motion. She exhibits no edema or tenderness.  CVA tenderness on left side   Neurological: She is alert and oriented to person, place, and time. She has normal reflexes. No cranial nerve deficit.  Skin: Skin is warm and dry.  Psychiatric: She has a normal mood and affect. Her behavior is normal. Judgment and thought content normal.  Vitals reviewed.   BP 140/78 mmHg  Pulse 78  Ht '5\' 1"'$  (1.549 m)  Wt 186 lb 12.8 oz (84.732 kg)  BMI 35.31 kg/m2       Assessment & Plan:  1. Gastroesophageal reflux disease with esophagitis -Diet discussed -PT states she can not afford Dexilant and wants to continue Protonix  - CMP14+EGFR - CBC with Differential/Platelet  2. ESOPHAGEAL STRICTURE - CMP14+EGFR - CBC with Differential/Platelet  3. Flank pain - POCT urinalysis dipstick - POCT UA - Microscopic Only - CMP14+EGFR - CBC with Differential/Platelet  4. Constipation, unspecified constipation type -PT to start taking colace BID -Force fluids -Encourage high fiber diet   Continue all meds Labs pending Health Maintenance reviewed Diet and exercise encouraged RTO 2 weeks to recheck constipation   [name], FNP

## 2015-10-17 LAB — CBC WITH DIFFERENTIAL/PLATELET
Basophils Absolute: 0.1 10*3/uL (ref 0.0–0.2)
Basos: 1 %
EOS (ABSOLUTE): 0.4 10*3/uL (ref 0.0–0.4)
EOS: 5 %
HEMATOCRIT: 37.9 % (ref 34.0–46.6)
HEMOGLOBIN: 12.6 g/dL (ref 11.1–15.9)
IMMATURE GRANS (ABS): 0 10*3/uL (ref 0.0–0.1)
IMMATURE GRANULOCYTES: 0 %
LYMPHS: 43 %
Lymphocytes Absolute: 4 10*3/uL — ABNORMAL HIGH (ref 0.7–3.1)
MCH: 29.2 pg (ref 26.6–33.0)
MCHC: 33.2 g/dL (ref 31.5–35.7)
MCV: 88 fL (ref 79–97)
MONOCYTES: 7 %
Monocytes Absolute: 0.6 10*3/uL (ref 0.1–0.9)
NEUTROS PCT: 44 %
Neutrophils Absolute: 4 10*3/uL (ref 1.4–7.0)
Platelets: 328 10*3/uL (ref 150–379)
RBC: 4.31 x10E6/uL (ref 3.77–5.28)
RDW: 13.9 % (ref 12.3–15.4)
WBC: 9.1 10*3/uL (ref 3.4–10.8)

## 2015-10-17 LAB — CMP14+EGFR
ALBUMIN: 4.4 g/dL (ref 3.5–4.8)
ALK PHOS: 84 IU/L (ref 39–117)
ALT: 10 IU/L (ref 0–32)
AST: 14 IU/L (ref 0–40)
Albumin/Globulin Ratio: 1.5 (ref 1.1–2.5)
BUN / CREAT RATIO: 8 — AB (ref 11–26)
BUN: 11 mg/dL (ref 8–27)
Bilirubin Total: 0.5 mg/dL (ref 0.0–1.2)
CHLORIDE: 97 mmol/L (ref 96–106)
CO2: 23 mmol/L (ref 18–29)
Calcium: 9.1 mg/dL (ref 8.7–10.3)
Creatinine, Ser: 1.42 mg/dL — ABNORMAL HIGH (ref 0.57–1.00)
GFR calc Af Amer: 43 mL/min/{1.73_m2} — ABNORMAL LOW (ref >59)
GFR calc non Af Amer: 37 mL/min/{1.73_m2} — ABNORMAL LOW (ref >59)
GLUCOSE: 85 mg/dL (ref 65–99)
Globulin, Total: 3 g/dL (ref 1.5–4.5)
Potassium: 4.2 mmol/L (ref 3.5–5.2)
Sodium: 135 mmol/L (ref 134–144)
Total Protein: 7.4 g/dL (ref 6.0–8.5)

## 2015-12-14 ENCOUNTER — Other Ambulatory Visit: Payer: Self-pay | Admitting: Family

## 2016-03-06 ENCOUNTER — Other Ambulatory Visit: Payer: Self-pay | Admitting: Internal Medicine

## 2016-03-11 ENCOUNTER — Telehealth: Payer: Self-pay

## 2016-03-11 NOTE — Telephone Encounter (Signed)
Need a referral for my dermatologist

## 2016-03-11 NOTE — Telephone Encounter (Signed)
Need a referral to my dermatologist

## 2016-03-13 NOTE — Telephone Encounter (Signed)
Pt only needs a [employer] referral  Already scheduled   Debbi will do [facility] referral

## 2016-03-13 NOTE — Telephone Encounter (Signed)
What does she need to see dermatologists for? Can not place order without diagnoses

## 2016-04-04 DIAGNOSIS — D239 Other benign neoplasm of skin, unspecified: Secondary | ICD-10-CM | POA: Diagnosis not present

## 2016-04-04 DIAGNOSIS — L821 Other seborrheic keratosis: Secondary | ICD-10-CM | POA: Diagnosis not present

## 2016-04-23 ENCOUNTER — Other Ambulatory Visit: Payer: Self-pay | Admitting: Physician Assistant

## 2016-05-09 ENCOUNTER — Other Ambulatory Visit: Payer: Self-pay | Admitting: Family

## 2016-05-21 ENCOUNTER — Encounter: Payer: Self-pay | Admitting: Family Medicine

## 2016-05-21 ENCOUNTER — Ambulatory Visit (INDEPENDENT_AMBULATORY_CARE_PROVIDER_SITE_OTHER): Payer: Commercial Managed Care - HMO | Admitting: Family Medicine

## 2016-05-21 VITALS — BP 125/80 | HR 77 | Temp 98.3°F | Ht 61.0 in | Wt 184.0 lb

## 2016-05-21 DIAGNOSIS — M6248 Contracture of muscle, other site: Secondary | ICD-10-CM | POA: Diagnosis not present

## 2016-05-21 DIAGNOSIS — M62838 Other muscle spasm: Secondary | ICD-10-CM

## 2016-05-21 DIAGNOSIS — R0981 Nasal congestion: Secondary | ICD-10-CM | POA: Diagnosis not present

## 2016-05-21 NOTE — Patient Instructions (Signed)
Use massage and heat and cold and ibuprofen or Aleve. Also do regular stretching

## 2016-05-21 NOTE — Progress Notes (Signed)
BP 125/80   Pulse 77   Temp 98.3 F (36.8 C) (Oral)   Ht 5\' 1"  (1.549 m)   Wt 184 lb (83.5 kg)   BMI 34.77 kg/m    Subjective:    Patient ID: Susan Contreras, female    DOB: 05-26-1941, 75 y.o.   MRN: [id number]  HPI: Susan Contreras is a 75 y.o. female presenting on 05/21/2016 for Neck Pain (left side, began this morning, using heat and Advil) and Sinusitis   HPI Neck pain/spasm Patient comes in today with the neck pain that she woke up with just this morning on the left side of her neck she says that it is very severe and she has difficulty turning her head to left or bending it towards the left. She was concerned because she's never had this severe before. She denies any fevers or chills or numbness or weakness in any of her arms or legs. She denies any pain radiating anywhere else except upper neck into the back of her head.  Sinus congestion Patient has been having sinus congestion and feeling a little swimmy headed over the past couple days. She has tried in her norel but has not really use anything else. She does have her Flonase but she forgot to use that. She denies any fevers or chills. She does have a little bit of a cough and some postnasal drainage. She denies any shortness of breath or wheezing  Relevant past medical, surgical, family and social history reviewed and updated as indicated. Interim medical history since our last visit reviewed. Allergies and medications reviewed and updated.  Review of Systems  Constitutional: Negative for chills and fever.  HENT: Positive for congestion, postnasal drip, rhinorrhea, sinus pressure, sneezing and sore throat. Negative for ear discharge and ear pain.   Eyes: Negative for pain, redness and visual disturbance.  Respiratory: Positive for cough. Negative for chest tightness and shortness of breath.   Cardiovascular: Negative for chest pain and leg swelling.  Genitourinary: Negative for difficulty urinating and dysuria.    Musculoskeletal: Positive for neck pain and neck stiffness. Negative for back pain and gait problem.  Skin: Negative for rash.  Neurological: Positive for dizziness. Negative for light-headedness and headaches.  Psychiatric/Behavioral: Negative for agitation and behavioral problems.  All other systems reviewed and are negative.   Per HPI unless specifically indicated above     Medication List       Accurate as of 05/21/16  5:44 PM. Always use your most recent med list.          ALPRAZolam 0.25 MG tablet Commonly known as:  XANAX Take 1 tablet (0.25 mg total) by mouth at bedtime as needed for anxiety.   atorvastatin 20 MG tablet Commonly known as:  LIPITOR Take 1 tablet (20 mg total) by mouth daily.   estradiol 0.05 MG/24HR patch Commonly known as:  VIVELLE-DOT Place 1 patch (0.05 mg total) onto the skin 2 (two) times a week. Reported on 09/21/2015   fluticasone 50 MCG/ACT nasal spray Commonly known as:  FLONASE USE ONE SPRAY IN EACH NOSTRIL EVERY DAY   hydrocortisone 25 MG suppository Commonly known as:  ANUSOL-HC Place 1 suppository (25 mg total) rectally 2 (two) times daily.   irbesartan 75 MG tablet Commonly known as:  AVAPRO Take 1 tablet (75 mg total) by mouth daily.   levothyroxine 50 MCG tablet Commonly known as:  SYNTHROID, LEVOTHROID TAKE 1 TABLET DAILY   meclizine 25 MG tablet Commonly known as:  ANTIVERT  TAKE ONE TABLET BY MOUTH THREE TIMES DAILY AS NEEDED FOR DIZZINESS   pantoprazole 40 MG tablet Commonly known as:  PROTONIX Take 1 tablet (40 mg total) by mouth 2 (two) times daily.          Objective:    BP 125/80   Pulse 77   Temp 98.3 F (36.8 C) (Oral)   Ht 5\' 1"  (1.549 m)   Wt 184 lb (83.5 kg)   BMI 34.77 kg/m   Wt Readings from Last 3 Encounters:  05/21/16 184 lb (83.5 kg)  10/16/15 186 lb 12.8 oz (84.7 kg)  09/21/15 188 lb (85.3 kg)    Physical Exam  Constitutional: She is oriented to person, place, and time. She appears  well-developed and well-nourished. No distress.  HENT:  Right Ear: Tympanic membrane, external ear and ear canal normal.  Left Ear: Tympanic membrane, external ear and ear canal normal.  Nose: Mucosal edema and rhinorrhea present. No epistaxis. Right sinus exhibits no maxillary sinus tenderness and no frontal sinus tenderness. Left sinus exhibits no maxillary sinus tenderness and no frontal sinus tenderness.  Mouth/Throat: Uvula is midline and mucous membranes are normal. Posterior oropharyngeal edema and posterior oropharyngeal erythema present. No oropharyngeal exudate or tonsillar abscesses.  Eyes: Conjunctivae and EOM are normal.  Cardiovascular: Normal rate, regular rhythm, normal heart sounds and intact distal pulses.   No murmur heard. Pulmonary/Chest: Effort normal and breath sounds normal. No respiratory distress. She has no wheezes.  Musculoskeletal: Normal range of motion. She exhibits tenderness (Left-sided neck muscular tenderness, no midline tenderness, no weakness in either arm or leg.). She exhibits no edema.  Neurological: She is alert and oriented to person, place, and time. Coordination normal.  Skin: Skin is warm and dry. No rash noted. She is not diaphoretic.  Psychiatric: She has a normal mood and affect. Her behavior is normal.  Vitals reviewed.     Assessment & Plan:   Problem List Items Addressed This Visit    None    Visit Diagnoses    Sinus congestion    -  Primary   Recommend using Flonase and an antihistamine and Mucinex and nasal saline for at least 3 or 4 days   Muscle spasms of neck       Patient has muscle spasm on the left side of her neck that causes more pain with rotation or flexion towards the left. These started just this morning.       Follow up plan: Return if symptoms worsen or fail to improve.  Counseling provided for all of the vaccine components No orders of the defined types were placed in this encounter.   Caryl Pina,  MD [facility] 05/21/2016, 5:44 PM

## 2016-05-28 ENCOUNTER — Telehealth: Payer: Self-pay

## 2016-05-28 NOTE — Telephone Encounter (Signed)
Incoming fax from [facility] requesting pantoprazole 40mg  BID. Is this ok to refill?

## 2016-05-29 ENCOUNTER — Other Ambulatory Visit: Payer: Self-pay

## 2016-05-29 MED ORDER — PANTOPRAZOLE SODIUM 40 MG PO TBEC: 40.0000 mg | DELAYED_RELEASE_TABLET | Freq: Two times a day (BID) | ORAL | 0 refills | Status: DC

## 2016-05-29 NOTE — Telephone Encounter (Signed)
Pantoprazole refilled. Note sent to pharmacy for pt to call and schedule office visit.

## 2016-05-29 NOTE — Telephone Encounter (Signed)
Yes we can refill it but she also needs an office visit to establish with me if you can help coordinate that. Thanks

## 2016-06-03 ENCOUNTER — Ambulatory Visit: Payer: Self-pay | Admitting: Family Medicine

## 2016-06-04 ENCOUNTER — Encounter: Payer: Self-pay | Admitting: Family Medicine

## 2016-06-04 ENCOUNTER — Ambulatory Visit (INDEPENDENT_AMBULATORY_CARE_PROVIDER_SITE_OTHER): Payer: Commercial Managed Care - HMO | Admitting: Family Medicine

## 2016-06-04 VITALS — BP 125/83 | HR 78 | Temp 97.2°F | Ht 61.0 in | Wt 184.2 lb

## 2016-06-04 DIAGNOSIS — M542 Cervicalgia: Secondary | ICD-10-CM

## 2016-06-04 MED ORDER — METHYLPREDNISOLONE ACETATE 80 MG/ML IJ SUSP
80.0000 mg | Freq: Once | INTRAMUSCULAR | Status: AC
  Administered 2016-06-04: 80 mg via INTRAMUSCULAR

## 2016-06-04 NOTE — Addendum Note (Signed)
Addended by: Nigel Berthold C on: 06/04/2016 10:00 AM   Modules accepted: Orders

## 2016-06-04 NOTE — Patient Instructions (Signed)
Great to meet you!  Please let us know if you are not getting better as expected. Please come back in 2-4 weeks to follow up for high blood pressure and high cholesterol.   Continue to use aspercreme, heat or ice, and tylenol for the pain.   Please come back if you have any worsening of your hand symptoms or the pain is not improving as expected.

## 2016-06-04 NOTE — Progress Notes (Signed)
   HPI  Patient presents today here with left-sided neck pain.  Patient explains that over the last 2 weeks she's had left-sided neck pain with is a left hand numbness and tingling pain.  She states that the pain actually improved completely but then came back about 2 days ago. She's been using Aspercreme, heat, and massage. This helped in the first episode and has not helped as much this time. Tylenol has been helpful.  She states that she does have some left hand numbness. She has no decreased use of left hand.  PMH: Smoking status noted ROS: Per HPI  Objective: BP 125/83   Pulse 78   Temp 97.2 F (36.2 C) (Oral)   Ht 5\' 1"  (1.549 m)   Wt 184 lb 3.2 oz (83.6 kg)   BMI 34.80 kg/m  Gen: NAD, alert, cooperative with exam HEENT: NCAT, EOMI, PERRL CV: RRR, good S1/S2, no murmur Resp: CTABL, no wheezes, non-labored Abd: SNTND, BS present, no guarding or organomegaly Ext: No edema, warm Neuro: Alert and oriented, drink 4/5 and symmetric in bilateral grip and upper extremities  MSK: Tenderness to palpation of paraspinal muscles in the cervical area on the left side Preserved range of motion of the neck    Assessment and plan:  # Neck pain Left-sided neck pain with radiculopathy Treat with Solu-Medrol IM given that she has severe issues with GERD Continue supportive care Return to clinic with any concerns Considering age I avoided muscle relaxers for now.   Laroy Apple, MD [facility] 06/04/2016, 9:24 AM

## 2016-06-11 ENCOUNTER — Other Ambulatory Visit: Payer: Self-pay | Admitting: Family Medicine

## 2016-06-11 ENCOUNTER — Other Ambulatory Visit: Payer: Self-pay | Admitting: Family

## 2016-06-16 DIAGNOSIS — H5200 Hypermetropia, unspecified eye: Secondary | ICD-10-CM | POA: Diagnosis not present

## 2016-06-16 DIAGNOSIS — Z0100 Encounter for examination of eyes and vision without abnormal findings: Secondary | ICD-10-CM | POA: Diagnosis not present

## 2016-06-16 DIAGNOSIS — I10 Essential (primary) hypertension: Secondary | ICD-10-CM | POA: Diagnosis not present

## 2016-06-16 DIAGNOSIS — H40013 Open angle with borderline findings, low risk, bilateral: Secondary | ICD-10-CM | POA: Diagnosis not present

## 2016-07-01 ENCOUNTER — Other Ambulatory Visit: Payer: Self-pay | Admitting: Gastroenterology

## 2016-07-03 ENCOUNTER — Other Ambulatory Visit: Payer: Self-pay | Admitting: Gastroenterology

## 2016-07-03 ENCOUNTER — Other Ambulatory Visit: Payer: Self-pay | Admitting: *Deleted

## 2016-07-03 MED ORDER — PANTOPRAZOLE SODIUM 40 MG PO TBEC
40.0000 mg | DELAYED_RELEASE_TABLET | Freq: Two times a day (BID) | ORAL | 0 refills | Status: DC
Start: 2016-07-03 — End: 2016-08-01

## 2016-07-03 MED ORDER — PANTOPRAZOLE SODIUM 40 MG PO TBEC: 40.0000 mg | DELAYED_RELEASE_TABLET | Freq: Two times a day (BID) | ORAL | 0 refills | Status: DC

## 2016-07-08 ENCOUNTER — Encounter: Payer: Self-pay | Admitting: Family

## 2016-07-08 ENCOUNTER — Ambulatory Visit (INDEPENDENT_AMBULATORY_CARE_PROVIDER_SITE_OTHER): Payer: Commercial Managed Care - HMO | Admitting: Family

## 2016-07-08 VITALS — BP 127/84 | HR 81 | Temp 98.2°F | Ht 61.0 in | Wt 185.6 lb

## 2016-07-08 DIAGNOSIS — E785 Hyperlipidemia, unspecified: Secondary | ICD-10-CM | POA: Diagnosis not present

## 2016-07-08 DIAGNOSIS — F411 Generalized anxiety disorder: Secondary | ICD-10-CM | POA: Diagnosis not present

## 2016-07-08 DIAGNOSIS — Z1211 Encounter for screening for malignant neoplasm of colon: Secondary | ICD-10-CM | POA: Diagnosis not present

## 2016-07-08 DIAGNOSIS — E8881 Metabolic syndrome: Secondary | ICD-10-CM

## 2016-07-08 DIAGNOSIS — E039 Hypothyroidism, unspecified: Secondary | ICD-10-CM | POA: Diagnosis not present

## 2016-07-08 DIAGNOSIS — K2100 Gastro-esophageal reflux disease with esophagitis, without bleeding: Secondary | ICD-10-CM

## 2016-07-08 DIAGNOSIS — I10 Essential (primary) hypertension: Secondary | ICD-10-CM | POA: Diagnosis not present

## 2016-07-08 DIAGNOSIS — E559 Vitamin D deficiency, unspecified: Secondary | ICD-10-CM

## 2016-07-08 DIAGNOSIS — J301 Allergic rhinitis due to pollen: Secondary | ICD-10-CM | POA: Diagnosis not present

## 2016-07-08 MED ORDER — ALPRAZOLAM 0.25 MG PO TABS: 0.2500 mg | ORAL_TABLET | Freq: Every evening | ORAL | 3 refills | Status: DC | PRN

## 2016-07-08 MED ORDER — MECLIZINE HCL 25 MG PO TABS: ORAL_TABLET | ORAL | 3 refills | Status: DC

## 2016-07-08 NOTE — Progress Notes (Signed)
Subjective:    Patient ID: Susan Contreras, female    DOB: [birth year], 74 y.o.   MRN: [id number]  PT presents to the office today for chronic follow up.  a   Hypertension  This is a chronic problem. The current episode started more than 1 year ago. The problem has been resolved since onset. The problem is controlled. Pertinent negatives include no anxiety, headaches, palpitations, peripheral edema or shortness of breath. Risk factors for coronary artery disease include post-menopausal state, obesity and dyslipidemia. Past treatments include angiotensin blockers. The current treatment provides moderate improvement. Hypertensive end-organ damage includes a thyroid problem. There is no history of kidney disease, CAD/MI, CVA or heart failure. There is no history of sleep apnea.  Gastroesophageal Reflux  She complains of belching and heartburn. She reports no coughing or no sore throat. This is a chronic problem. The current episode started more than 1 year ago. The problem occurs rarely. The problem has been waxing and waning. The symptoms are aggravated by certain foods and lying down. Pertinent negatives include no fatigue. She has tried a PPI for the symptoms. The treatment provided moderate relief.  Thyroid Problem  Presents for follow-up visit. Symptoms include heat intolerance. Patient reports no constipation, depressed mood, diarrhea, fatigue or palpitations. The symptoms have been stable. Past treatments include levothyroxine. The treatment provided moderate relief. Her past medical history is significant for hyperlipidemia. There is no history of heart failure.  Hyperlipidemia  This is a chronic problem. The current episode started more than 1 year ago. The problem is uncontrolled. Recent lipid tests were reviewed and are high. Exacerbating diseases include obesity. Pertinent negatives include no shortness of breath. Current antihyperlipidemic treatment includes diet change. The current treatment  provides no improvement of lipids. Risk factors for coronary artery disease include dyslipidemia, family history, obesity and hypertension.      Review of Systems  Constitutional: Negative for fatigue.  HENT: Negative.  Negative for sore throat.   Eyes: Negative.   Respiratory: Negative.  Negative for cough and shortness of breath.   Cardiovascular: Negative.  Negative for palpitations.  Gastrointestinal: Positive for heartburn. Negative for constipation and diarrhea.  Endocrine: Positive for heat intolerance.  Genitourinary: Negative.   Musculoskeletal: Negative.   Neurological: Negative.  Negative for headaches.  Hematological: Negative.   Psychiatric/Behavioral: Negative.   All other systems reviewed and are negative.      Objective:   Physical Exam  Constitutional: She is oriented to person, place, and time. She appears well-developed and well-nourished. No distress.  HENT:  Head: Normocephalic and atraumatic.  Right Ear: External ear normal.  Left Ear: External ear normal.  Nose: Mucosal edema and rhinorrhea present.  Mouth/Throat: Oropharynx is clear and moist.  Eyes: Pupils are equal, round, and reactive to light.  Neck: Normal range of motion. Neck supple. No thyromegaly present.  Cardiovascular: Normal rate, regular rhythm, normal heart sounds and intact distal pulses.   No murmur heard. Pulmonary/Chest: Effort normal and breath sounds normal. No respiratory distress. She has no wheezes.  Abdominal: Soft. Bowel sounds are normal. She exhibits no distension. There is no tenderness.  Musculoskeletal: Normal range of motion. She exhibits no edema or tenderness.  Neurological: She is alert and oriented to person, place, and time. She has normal reflexes. No cranial nerve deficit.  Skin: Skin is warm and dry.  Psychiatric: She has a normal mood and affect. Her behavior is normal. Judgment and thought content normal.  Vitals reviewed.  BP 127/84   Pulse 81   Temp  98.2 F (36.8 C) (Oral)   Ht 5' 1"  (1.549 m)   Wt 185 lb 9.6 oz (84.2 kg)   BMI 35.07 kg/m      Assessment & Plan:  1. Essential hypertension - CMP14+EGFR  2. Chronic allergic rhinitis due to pollen, unspecified seasonality - CMP14+EGFR  3. GAD (generalized anxiety disorder) - CMP14+EGFR - ALPRAZolam (XANAX) 0.25 MG tablet; Take 1 tablet (0.25 mg total) by mouth at bedtime as needed for anxiety.  Dispense: 30 tablet; Refill: 3  4. Hyperlipidemia, unspecified hyperlipidemia type - CMP14+EGFR - Lipid panel  5. Metabolic syndrome - SYH99+MVAE  6. Vitamin D deficiency - CMP14+EGFR - VITAMIN D 25 Hydroxy (Vit-D Deficiency, Fractures)  7. Hypothyroidism, unspecified type - CMP14+EGFR - Thyroid Panel With TSH  8. Gastroesophageal reflux disease with esophagitis - CMP14+EGFR  9. Colon cancer screening  - Fecal occult blood, imunochemical; Future   Continue all meds Labs pending Health Maintenance reviewed Diet and exercise encouraged RTO 6 months  [name], FNP

## 2016-07-08 NOTE — Patient Instructions (Addendum)
Fat and Cholesterol Restricted Diet Introduction Getting too much fat and cholesterol in your diet may cause health problems. Following this diet helps keep your fat and cholesterol at normal levels. This can keep you from getting sick. What types of fat should I choose?  Choose monosaturated and polyunsaturated fats. These are found in foods such as olive oil, canola oil, flaxseeds, walnuts, almonds, and seeds.  Eat more omega-3 fats. Good choices include salmon, mackerel, sardines, tuna, flaxseed oil, and ground flaxseeds.  Limit saturated fats. These are in animal products such as meats, butter, and cream. They can also be in plant products such as palm oil, palm kernel oil, and coconut oil.  Avoid foods with partially hydrogenated oils in them. These contain trans fats. Examples of foods that have trans fats are stick margarine, some tub margarines, cookies, crackers, and other baked goods. What general guidelines do I need to follow?  Check food labels. Look for the words "trans fat" and "saturated fat."  When preparing a meal:  Fill half of your plate with vegetables and green salads.  Fill one fourth of your plate with whole grains. Look for the word "whole" as the first word in the ingredient list.  Fill one fourth of your plate with lean protein foods.  Eat more foods that have fiber, like apples, carrots, beans, peas, and barley.  Eat more home-cooked foods. Eat less at restaurants and buffets.  Limit or avoid alcohol.  Limit foods high in starch and sugar.  Limit fried foods.  Cook foods without frying them. Baking, boiling, grilling, and broiling are all great options.  Lose weight if you are overweight. Losing even a small amount of weight can help your overall health. It can also help prevent diseases such as diabetes and heart disease. What foods can I eat? Grains  Whole grains, such as whole wheat or whole grain breads, crackers, cereals, and pasta. Unsweetened  oatmeal, bulgur, barley, quinoa, or brown rice. Corn or whole wheat flour tortillas. Vegetables  Fresh or frozen vegetables (raw, steamed, roasted, or grilled). Green salads. Fruits  All fresh, canned (in natural juice), or frozen fruits. Meat and Other Protein Products  Ground beef (85% or leaner), grass-fed beef, or beef trimmed of fat. Skinless chicken or turkey. Ground chicken or turkey. Pork trimmed of fat. All fish and seafood. Eggs. Dried beans, peas, or lentils. Unsalted nuts or seeds. Unsalted canned or dry beans. Dairy  Low-fat dairy products, such as skim or 1% milk, 2% or reduced-fat cheeses, low-fat ricotta or cottage cheese, or plain low-fat yogurt. Fats and Oils  Tub margarines without trans fats. Light or reduced-fat mayonnaise and salad dressings. Avocado. Olive, canola, sesame, or safflower oils. Natural peanut or almond butter (choose ones without added sugar and oil). The items listed above may not be a complete list of recommended foods or beverages. Contact your dietitian for more options.  What foods are not recommended? Grains  White bread. White pasta. White rice. Cornbread. Bagels, pastries, and croissants. Crackers that contain trans fat. Vegetables  White potatoes. Corn. Creamed or fried vegetables. Vegetables in a cheese sauce. Fruits  Dried fruits. Canned fruit in light or heavy syrup. Fruit juice. Meat and Other Protein Products  Fatty cuts of meat. Ribs, chicken wings, bacon, sausage, bologna, salami, chitterlings, fatback, hot dogs, bratwurst, and packaged luncheon meats. Liver and organ meats. Dairy  Whole or 2% milk, cream, half-and-half, and cream cheese. Whole milk cheeses. Whole-fat or sweetened yogurt. Full-fat cheeses. Nondairy creamers and   whipped toppings. Processed cheese, cheese spreads, or cheese curds. Sweets and Desserts  Corn syrup, sugars, honey, and molasses. Candy. Jam and jelly. Syrup. Sweetened cereals. Cookies, pies, cakes, donuts,  muffins, and ice cream. Fats and Oils  Butter, stick margarine, lard, shortening, ghee, or bacon fat. Coconut, palm kernel, or palm oils. Beverages  Alcohol. Sweetened drinks (such as sodas, lemonade, and fruit drinks or punches). The items listed above may not be a complete list of foods and beverages to avoid. Contact your dietitian for more information.  This information is not intended to replace advice given to you by your health care provider. Make sure you discuss any questions you have with your health care provider. Document Released: 02/10/2012 Document Revised: 04/17/2016 Document Reviewed: 11/10/2013  2017 Elsevier  

## 2016-07-09 ENCOUNTER — Telehealth: Payer: Self-pay | Admitting: Family

## 2016-07-09 LAB — CMP14+EGFR
A/G RATIO: 1.3 (ref 1.2–2.2)
ALBUMIN: 4.6 g/dL (ref 3.5–4.8)
ALK PHOS: 92 IU/L (ref 39–117)
ALT: 12 IU/L (ref 0–32)
AST: 18 IU/L (ref 0–40)
BILIRUBIN TOTAL: 0.5 mg/dL (ref 0.0–1.2)
BUN / CREAT RATIO: 9 — AB (ref 12–28)
BUN: 12 mg/dL (ref 8–27)
CHLORIDE: 97 mmol/L (ref 96–106)
CO2: 21 mmol/L (ref 18–29)
Calcium: 9.7 mg/dL (ref 8.7–10.3)
Creatinine, Ser: 1.35 mg/dL — ABNORMAL HIGH (ref 0.57–1.00)
GFR calc Af Amer: 45 mL/min/{1.73_m2} — ABNORMAL LOW (ref >59)
GFR calc non Af Amer: 39 mL/min/{1.73_m2} — ABNORMAL LOW (ref >59)
GLUCOSE: 91 mg/dL (ref 65–99)
Globulin, Total: 3.6 g/dL (ref 1.5–4.5)
POTASSIUM: 4.6 mmol/L (ref 3.5–5.2)
Sodium: 138 mmol/L (ref 134–144)
Total Protein: 8.2 g/dL (ref 6.0–8.5)

## 2016-07-09 LAB — THYROID PANEL WITH TSH
FREE THYROXINE INDEX: 1.9 (ref 1.2–4.9)
T3 Uptake Ratio: 27 % (ref 24–39)
T4 TOTAL: 7.2 ug/dL (ref 4.5–12.0)
TSH: 5.09 u[IU]/mL — AB (ref 0.450–4.500)

## 2016-07-09 LAB — LIPID PANEL
CHOLESTEROL TOTAL: 262 mg/dL — AB (ref 100–199)
Chol/HDL Ratio: 5.1 ratio units — ABNORMAL HIGH (ref 0.0–4.4)
HDL: 51 mg/dL (ref >39)
LDL Calculated: 133 mg/dL — ABNORMAL HIGH (ref 0–99)
Triglycerides: 389 mg/dL — ABNORMAL HIGH (ref 0–149)
VLDL CHOLESTEROL CAL: 78 mg/dL — AB (ref 5–40)

## 2016-07-09 LAB — VITAMIN D 25 HYDROXY (VIT D DEFICIENCY, FRACTURES): Vit D, 25-Hydroxy: 19.1 ng/mL — ABNORMAL LOW (ref 30.0–100.0)

## 2016-07-09 NOTE — Telephone Encounter (Signed)
Aware, amoxicilian called to [facility].

## 2016-07-09 NOTE — Telephone Encounter (Signed)
Patient seen [facility] yesterday and states that she told her if she was not better she would call in a rx for amoxicillin. Patient is having nasal congestion, cough and headache for 2 days and states that she woke up this morning feeling worse and also having facial pressure. Covering PCP, please advise.

## 2016-07-09 NOTE — Telephone Encounter (Signed)
[email or web address]

## 2016-07-09 NOTE — Telephone Encounter (Signed)
Okay to go ahead and call in amoxicillin 500 twice a day for 7 days

## 2016-07-10 ENCOUNTER — Other Ambulatory Visit: Payer: Self-pay | Admitting: Family

## 2016-07-10 MED ORDER — VITAMIN D (ERGOCALCIFEROL) 1.25 MG (50000 UNIT) PO CAPS: 50000.0000 [IU] | ORAL_CAPSULE | ORAL | 3 refills | Status: DC

## 2016-07-10 MED ORDER — LEVOTHYROXINE SODIUM 75 MCG PO TABS: 75.0000 ug | ORAL_TABLET | Freq: Every day | ORAL | 1 refills | Status: DC

## 2016-07-10 MED ORDER — ATORVASTATIN CALCIUM 20 MG PO TABS: 20.0000 mg | ORAL_TABLET | Freq: Every day | ORAL | 3 refills | Status: DC

## 2016-07-14 ENCOUNTER — Other Ambulatory Visit: Payer: Self-pay | Admitting: Family

## 2016-08-01 ENCOUNTER — Other Ambulatory Visit: Payer: Self-pay | Admitting: Family

## 2016-08-12 ENCOUNTER — Encounter: Payer: Self-pay | Admitting: Pediatrics

## 2016-08-12 ENCOUNTER — Ambulatory Visit (INDEPENDENT_AMBULATORY_CARE_PROVIDER_SITE_OTHER): Payer: Commercial Managed Care - HMO | Admitting: Pediatrics

## 2016-08-12 VITALS — BP 138/75 | HR 71 | Temp 97.4°F | Ht 61.0 in | Wt 186.0 lb

## 2016-08-12 DIAGNOSIS — J019 Acute sinusitis, unspecified: Secondary | ICD-10-CM

## 2016-08-12 MED ORDER — AMOXICILLIN 500 MG PO CAPS: 500.0000 mg | ORAL_CAPSULE | Freq: Three times a day (TID) | ORAL | 0 refills | Status: DC

## 2016-08-12 NOTE — Patient Instructions (Signed)
flonase Salt water nose spray Drink lots of fluids

## 2016-08-12 NOTE — Progress Notes (Signed)
  Subjective:   Patient ID: Susan Contreras, female    DOB: 1941/01/21, 75 y.o.   MRN: [id number] CC: Nasal Congestion; Sinus pressure; Cough; and Headaches  HPI: Susan Contreras is a 75 y.o. female presenting for Nasal Congestion; Sinus pressure; Cough; and Headaches  Started having more symptoms about 3 days ago About a month ago had sinus infection, treated with one week BID amoxicillin 500mg  Doesn't think she ever fully improved Some things have gotten better, others worse Last week was having cold chills Has been using steroid nose spray Hurting around her eyes Has pressure in her face Both ears hurting Taking OTC meds, dries her out but she thinks it helps Coughing some now for past coupl eof days, mildly productive Thinks she sometimes has hot/cold chills Taking tylenol Thinks she has been sick for at least a couple of weeks Appetite is normal Drinking plenty of fluids  Relevant past medical, surgical, family and social history reviewed. Allergies and medications reviewed and updated. History  Smoking Status  . Never Smoker  Smokeless Tobacco  . Never Used   ROS: Per HPI   Objective:    BP 138/75   Pulse 71   Temp 97.4 F (36.3 C) (Oral)   Ht 5\' 1"  (1.549 m)   Wt 186 lb (84.4 kg)   BMI 35.14 kg/m   Wt Readings from Last 3 Encounters:  08/12/16 186 lb (84.4 kg)  07/08/16 185 lb 9.6 oz (84.2 kg)  06/04/16 184 lb 3.2 oz (83.6 kg)    Gen: NAD, alert, cooperative with exam, NCAT EYES: EOMI, no conjunctival injection, or no icterus ENT:  TMs slightly pink b/l, OP without erythema, TTP over max, frontal sinuses LYMPH: no cervical LAD CV: NRRR, normal S1/S2, no murmur, distal pulses 2+ b/l Resp: CTABL, no wheezes, normal WOB Neuro: Alert and oriented  Assessment & Plan:  Jolita was seen today for nasal congestion, sinus pressure, cough and headaches. Discussed pathophys of sinus infections, how often starts as a virus and antibiotics do not help decrease length of  illness. Pt with allergies. Not clear when this illness started. Has had lots of pressure in sinuses/face last week, some chills, fever.  Not able to take augmentin she says because of the size of the pill, refuses liquid due to taste. Mulitple allergies to other abx. Will try TID amoxicillin. If not improving needs to be seen. Doesn't get many sinus infections usually, has had several this year. Discussed symptomatic care. Says she is not able to do sinus rinses. Cont flonase, start salt water nasal spray, antihistamine.  Diagnoses and all orders for this visit:  Acute sinusitis, recurrence not specified, unspecified location -     amoxicillin (AMOXIL) 500 MG capsule; Take 1 capsule (500 mg total) by mouth 3 (three) times daily.   Follow up plan: As needed Assunta Found, MD [place]

## 2016-12-22 ENCOUNTER — Ambulatory Visit (INDEPENDENT_AMBULATORY_CARE_PROVIDER_SITE_OTHER): Payer: Medicare HMO | Admitting: Family Medicine

## 2016-12-22 ENCOUNTER — Encounter: Payer: Self-pay | Admitting: Family Medicine

## 2016-12-22 VITALS — BP 119/76 | HR 85 | Temp 97.2°F | Ht 61.0 in | Wt 187.2 lb

## 2016-12-22 DIAGNOSIS — J0100 Acute maxillary sinusitis, unspecified: Secondary | ICD-10-CM

## 2016-12-22 MED ORDER — AMOXICILLIN 500 MG PO CAPS: 500.0000 mg | ORAL_CAPSULE | Freq: Three times a day (TID) | ORAL | 0 refills | Status: DC

## 2016-12-22 NOTE — Patient Instructions (Signed)
Great to see you!   Sinusitis, Adult Sinusitis is soreness and inflammation of your sinuses. Sinuses are hollow spaces in the bones around your face. They are located:  Around your eyes.  In the middle of your forehead.  Behind your nose.  In your cheekbones. Your sinuses and nasal passages are lined with a stringy fluid (mucus). Mucus normally drains out of your sinuses. When your nasal tissues get inflamed or swollen, the mucus can get trapped or blocked so air cannot flow through your sinuses. This lets bacteria, viruses, and funguses grow, and that leads to infection. Follow these instructions at home: Medicines   Take, use, or apply over-the-counter and prescription medicines only as told by your doctor. These may include nasal sprays.  If you were prescribed an antibiotic medicine, take it as told by your doctor. Do not stop taking the antibiotic even if you start to feel better. Hydrate and Humidify   Drink enough water to keep your pee (urine) clear or pale yellow.  Use a cool mist humidifier to keep the humidity level in your home above 50%.  Breathe in steam for 10-15 minutes, 3-4 times a day or as told by your doctor. You can do this in the bathroom while a hot shower is running.  Try not to spend time in cool or dry air. Rest   Rest as much as possible.  Sleep with your head raised (elevated).  Make sure to get enough sleep each night. General instructions   Put a warm, moist washcloth on your face 3-4 times a day or as told by your doctor. This will help with discomfort.  Wash your hands often with soap and water. If there is no soap and water, use hand sanitizer.  Do not smoke. Avoid being around people who are smoking (secondhand smoke).  Keep all follow-up visits as told by your doctor. This is important. Contact a doctor if:  You have a fever.  Your symptoms get worse.  Your symptoms do not get better within 10 days. Get help right away if:  You  have a very bad headache.  You cannot stop throwing up (vomiting).  You have pain or swelling around your face or eyes.  You have trouble seeing.  You feel confused.  Your neck is stiff.  You have trouble breathing. This information is not intended to replace advice given to you by your health care provider. Make sure you discuss any questions you have with your health care provider. Document Released: 01/28/2008 Document Revised: 04/06/2016 Document Reviewed: 06/06/2015 Elsevier Interactive Patient Education  2017 [employer].

## 2016-12-22 NOTE — Progress Notes (Signed)
   HPI  Patient presents today for facial pain.  Patient's when she's had about 7-10 days of primarily left-sided maxillary facial pain and pressure. She also complains of some left ear pain, mild intermittent headache, cough, and congestion.  She is allergic to many antibiotics, she states that Augmentin is too large to tolerate.  She takes amoxicillin for this problem and generally has very good improvement.  PMH: Smoking status noted ROS: Per HPI  Objective: BP 119/76   Pulse 85   Temp 97.2 F (36.2 C) (Oral)   Ht 5\' 1"  (1.549 m)   Wt 187 lb 3.2 oz (84.9 kg)   BMI 35.37 kg/m  Gen: NAD, alert, cooperative with exam HEENT: NCAT, left-sided maxillary sinus with tenderness to palpation, TMs normal bilaterally, no tenderness to palpation of her other sinuses CV: RRR, good S1/S2, no murmur Resp: CTABL, no wheezes, non-labored Ext: No edema, warm Neuro: Alert and oriented, No gross deficits  Assessment and plan:  # Acute maxillary sinusitis Treat with  amoxicillin  I discussed that amoxicillin has frequent resistance when it comes to sinusitis, patient will proceed anyway. Offered augmengtin as my primary choice, she has allergic or intolerance reactions to levaquin as well as Doxy  Call or RTC with any concerns, next step is augmentin if not improving as expected.     Meds ordered this encounter  Medications  . amoxicillin (AMOXIL) 500 MG capsule    Sig: Take 1 capsule (500 mg total) by mouth 3 (three) times daily.    Dispense:  30 capsule    Refill:  [place], MD [facility] 12/22/2016, 3:49 PM

## 2017-01-20 ENCOUNTER — Other Ambulatory Visit: Payer: Self-pay | Admitting: Family

## 2017-02-03 ENCOUNTER — Ambulatory Visit (INDEPENDENT_AMBULATORY_CARE_PROVIDER_SITE_OTHER): Payer: Medicare HMO | Admitting: Family

## 2017-02-03 ENCOUNTER — Encounter: Payer: Self-pay | Admitting: Family

## 2017-02-03 VITALS — BP 107/65 | HR 80 | Temp 97.4°F | Ht 61.0 in | Wt 184.0 lb

## 2017-02-03 DIAGNOSIS — F411 Generalized anxiety disorder: Secondary | ICD-10-CM

## 2017-02-03 DIAGNOSIS — Z1211 Encounter for screening for malignant neoplasm of colon: Secondary | ICD-10-CM

## 2017-02-03 DIAGNOSIS — E785 Hyperlipidemia, unspecified: Secondary | ICD-10-CM | POA: Diagnosis not present

## 2017-02-03 DIAGNOSIS — E039 Hypothyroidism, unspecified: Secondary | ICD-10-CM

## 2017-02-03 DIAGNOSIS — I10 Essential (primary) hypertension: Secondary | ICD-10-CM | POA: Diagnosis not present

## 2017-02-03 DIAGNOSIS — M79671 Pain in right foot: Secondary | ICD-10-CM | POA: Diagnosis not present

## 2017-02-03 DIAGNOSIS — E559 Vitamin D deficiency, unspecified: Secondary | ICD-10-CM | POA: Diagnosis not present

## 2017-02-03 DIAGNOSIS — K2100 Gastro-esophageal reflux disease with esophagitis, without bleeding: Secondary | ICD-10-CM

## 2017-02-03 MED ORDER — LEVOTHYROXINE SODIUM 75 MCG PO TABS: 75.0000 ug | ORAL_TABLET | Freq: Every day | ORAL | 1 refills | Status: DC

## 2017-02-03 MED ORDER — FLUTICASONE PROPIONATE 50 MCG/ACT NA SUSP: 1.0000 | Freq: Every day | NASAL | 5 refills | Status: DC

## 2017-02-03 MED ORDER — ALPRAZOLAM 0.25 MG PO TABS: 0.2500 mg | ORAL_TABLET | Freq: Every evening | ORAL | 5 refills | Status: DC | PRN

## 2017-02-03 MED ORDER — PANTOPRAZOLE SODIUM 40 MG PO TBEC: DELAYED_RELEASE_TABLET | ORAL | 5 refills | Status: DC

## 2017-02-03 MED ORDER — IRBESARTAN 75 MG PO TABS: ORAL_TABLET | ORAL | 1 refills | Status: DC

## 2017-02-03 NOTE — Progress Notes (Signed)
Subjective:    Patient ID: Susan Contreras, female    DOB: [birth year], 76 y.o.   MRN: [id number]  HPI Idalee Foxworthy is here today for follow up of chronic medical problem.  Outpatient Encounter Prescriptions as of 02/03/2017  Medication Sig  . ALPRAZolam (XANAX) 0.25 MG tablet Take 1 tablet (0.25 mg total) by mouth at bedtime as needed for anxiety.  . fluticasone (FLONASE) 50 MCG/ACT nasal spray USE ONE SPRAY IN EACH NOSTRIL EVERY DAY  . irbesartan (AVAPRO) 75 MG tablet Take 1 tablet (75 mg total) by mouth daily.  Marland Kitchen levothyroxine (SYNTHROID) 75 MCG tablet Take 1 tablet (75 mcg total) by mouth daily before breakfast.  . meclizine (ANTIVERT) 25 MG tablet TAKE ONE TABLET BY MOUTH THREE TIMES DAILY AS NEEDED FOR DIZZINESS  . pantoprazole (PROTONIX) 40 MG tablet TAKE  (1)  TABLET TWICE A DAY.  . [DISCONTINUED] amoxicillin (AMOXIL) 500 MG capsule Take 1 capsule (500 mg total) by mouth 3 (three) times daily.  . [DISCONTINUED] atorvastatin (LIPITOR) 20 MG tablet Take 1 tablet (20 mg total) by mouth daily.  . [DISCONTINUED] hydrocortisone (ANUSOL-HC) 25 MG suppository Place 1 suppository (25 mg total) rectally 2 (two) times daily.  . [DISCONTINUED] Vitamin D, Ergocalciferol, (DRISDOL) 50000 units CAPS capsule Take 1 capsule (50,000 Units total) by mouth every 7 (seven) days.   No facility-administered encounter medications on file as of 02/03/2017.     1. Essential hypertension  Patient managed with irbesartan.  Patient checks blood pressure at home about 3-4 times weekly.  Usually runs 120-130/70s.  2. Gastroesophageal reflux disease with esophagitis  Symptoms managed with pantoprazole.  3. Hypothyroidism, unspecified type  Patient takes levothyroxine.  4. GAD (generalized anxiety disorder)  Patient manages anxiety symptoms with prn Xanax.  Increased anxiety due to husband having back surgery.  Out of medicine at this time.  5. Hyperlipidemia, unspecified hyperlipidemia type Managed with low fat  diet and exercise.   6. Vitamin D deficiency  Patient takes a vitamin D supplement occasionally.  States it hurts her stomach sometimes.    New complaints: Patient is having pain in the right medial ankle for 2-3 months.  Pain is rated 4/10 and described as more painful when she puts weight on it.  She states the pain is worse in the morning and improves with putting Aspercreme on it.  She is also taking ibuprofen for pain relief when needed.    Review of Systems  Constitutional: Positive for fatigue (worse lately). Negative for activity change, appetite change and fever.  Respiratory: Negative for cough and shortness of breath.   Cardiovascular: Negative for chest pain and palpitations.  Gastrointestinal: Negative for abdominal distention and abdominal pain.  Musculoskeletal: Positive for arthralgias (right ankle and heel).  Neurological: Negative for dizziness and headaches.  All other systems reviewed and are negative.      Objective:   Physical Exam  Constitutional: She is oriented to person, place, and time. She appears well-developed and well-nourished. No distress.  HENT:  Head: Normocephalic.  Right Ear: External ear normal.  Left Ear: External ear normal.  Mouth/Throat: Oropharynx is clear and moist.  Eyes: Pupils are equal, round, and reactive to light.  Neck: Normal range of motion. Neck supple. No JVD present. No thyromegaly present.  Cardiovascular: Normal rate, regular rhythm, normal heart sounds and intact distal pulses.   No murmur heard. Pulmonary/Chest: Effort normal and breath sounds normal. No respiratory distress.  Abdominal: Soft. Bowel sounds are normal. She exhibits no distension.  There is no tenderness.  Musculoskeletal: Normal range of motion. She exhibits no edema.  Lymphadenopathy:    She has no cervical adenopathy.  Neurological: She is alert and oriented to person, place, and time.  Skin: Skin is warm and dry.  Psychiatric: She has a normal mood  and affect. Her behavior is normal. Judgment and thought content normal.   BP 107/65   Pulse 80   Temp 97.4 F (36.3 C) (Oral)   Ht 5' 1"  (1.549 m)   Wt 184 lb (83.5 kg)   BMI 34.77 kg/m      Assessment & Plan:  1. Essential hypertension - CMP14+EGFR - irbesartan (AVAPRO) 75 MG tablet; Take 1 tablet (75 mg total) by mouth daily.  Dispense: 90 tablet; Refill: 1  2. Gastroesophageal reflux disease with esophagitis - CMP14+EGFR - pantoprazole (PROTONIX) 40 MG tablet; TAKE  (1)  TABLET TWICE A DAY.  Dispense: 180 tablet; Refill: 5  3. Hypothyroidism, unspecified type - CMP14+EGFR - Thyroid Panel With TSH - levothyroxine (SYNTHROID) 75 MCG tablet; Take 1 tablet (75 mcg total) by mouth daily before breakfast.  Dispense: 90 tablet; Refill: 1  4. GAD (generalized anxiety disorder) - CMP14+EGFR - ALPRAZolam (XANAX) 0.25 MG tablet; Take 1 tablet (0.25 mg total) by mouth at bedtime as needed for anxiety.  Dispense: 30 tablet; Refill: 5  5. Hyperlipidemia, unspecified hyperlipidemia type  - CMP14+EGFR - Lipid panel  6. Vitamin D deficiency - CMP14+EGFR - VITAMIN D 25 Hydroxy (Vit-D Deficiency, Fractures)  7. Right foot pain - CMP14+EGFR  8. Colon cancer screening  - Fecal occult blood, imunochemical; Future   Continue all meds Labs pending Health Maintenance reviewed Diet and exercise encouraged RTO 6 months   [name], FNP

## 2017-02-03 NOTE — Patient Instructions (Signed)

## 2017-02-04 ENCOUNTER — Other Ambulatory Visit: Payer: Self-pay | Admitting: Family

## 2017-02-04 DIAGNOSIS — R232 Flushing: Secondary | ICD-10-CM

## 2017-02-04 LAB — CMP14+EGFR
ALBUMIN: 4.4 g/dL (ref 3.5–4.8)
ALT: 9 IU/L (ref 0–32)
AST: 15 IU/L (ref 0–40)
Albumin/Globulin Ratio: 1.3 (ref 1.2–2.2)
Alkaline Phosphatase: 82 IU/L (ref 39–117)
BUN / CREAT RATIO: 9 — AB (ref 12–28)
BUN: 11 mg/dL (ref 8–27)
Bilirubin Total: 0.5 mg/dL (ref 0.0–1.2)
CALCIUM: 9.4 mg/dL (ref 8.7–10.3)
CO2: 22 mmol/L (ref 20–29)
CREATININE: 1.25 mg/dL — AB (ref 0.57–1.00)
Chloride: 99 mmol/L (ref 96–106)
GFR, EST AFRICAN AMERICAN: 49 mL/min/{1.73_m2} — AB (ref >59)
GFR, EST NON AFRICAN AMERICAN: 42 mL/min/{1.73_m2} — AB (ref >59)
GLOBULIN, TOTAL: 3.3 g/dL (ref 1.5–4.5)
GLUCOSE: 89 mg/dL (ref 65–99)
Potassium: 4.3 mmol/L (ref 3.5–5.2)
Sodium: 138 mmol/L (ref 134–144)
TOTAL PROTEIN: 7.7 g/dL (ref 6.0–8.5)

## 2017-02-04 LAB — LIPID PANEL
CHOLESTEROL TOTAL: 261 mg/dL — AB (ref 100–199)
Chol/HDL Ratio: 6.7 ratio — ABNORMAL HIGH (ref 0.0–4.4)
HDL: 39 mg/dL — ABNORMAL LOW (ref >39)
TRIGLYCERIDES: 508 mg/dL — AB (ref 0–149)

## 2017-02-04 LAB — THYROID PANEL WITH TSH
FREE THYROXINE INDEX: 2.2 (ref 1.2–4.9)
T3 Uptake Ratio: 27 % (ref 24–39)
T4, Total: 8.1 ug/dL (ref 4.5–12.0)
TSH: 2.1 u[IU]/mL (ref 0.450–4.500)

## 2017-02-04 LAB — VITAMIN D 25 HYDROXY (VIT D DEFICIENCY, FRACTURES): Vit D, 25-Hydroxy: 17.6 ng/mL — ABNORMAL LOW (ref 30.0–100.0)

## 2017-02-05 ENCOUNTER — Other Ambulatory Visit: Payer: Self-pay | Admitting: Family

## 2017-02-05 MED ORDER — FENOFIBRATE 145 MG PO TABS: 145.0000 mg | ORAL_TABLET | Freq: Every day | ORAL | 11 refills | Status: DC

## 2017-02-05 MED ORDER — VITAMIN D (ERGOCALCIFEROL) 1.25 MG (50000 UNIT) PO CAPS: 50000.0000 [IU] | ORAL_CAPSULE | ORAL | 3 refills | Status: DC

## 2017-02-10 ENCOUNTER — Other Ambulatory Visit: Payer: Self-pay | Admitting: Family

## 2017-03-25 ENCOUNTER — Ambulatory Visit: Payer: Medicare HMO | Admitting: Family

## 2017-03-26 ENCOUNTER — Ambulatory Visit (INDEPENDENT_AMBULATORY_CARE_PROVIDER_SITE_OTHER): Payer: Medicare HMO | Admitting: Pediatrics

## 2017-03-26 ENCOUNTER — Ambulatory Visit (INDEPENDENT_AMBULATORY_CARE_PROVIDER_SITE_OTHER): Payer: Medicare HMO

## 2017-03-26 ENCOUNTER — Encounter: Payer: Self-pay | Admitting: Pediatrics

## 2017-03-26 VITALS — BP 124/78 | HR 70 | Temp 97.3°F | Ht 61.0 in | Wt 184.6 lb

## 2017-03-26 DIAGNOSIS — M25571 Pain in right ankle and joints of right foot: Secondary | ICD-10-CM

## 2017-03-26 DIAGNOSIS — M79671 Pain in right foot: Secondary | ICD-10-CM

## 2017-03-26 DIAGNOSIS — J3089 Other allergic rhinitis: Secondary | ICD-10-CM | POA: Diagnosis not present

## 2017-03-26 NOTE — Patient Instructions (Addendum)
Plantar Fasciitis Rehab Ask your health care provider which exercises are safe for you. Do exercises exactly as told by your health care provider and adjust them as directed. It is normal to feel mild stretching, pulling, tightness, or discomfort as you do these exercises, but you should stop right away if you feel sudden pain or your pain gets worse. Do not begin these exercises until told by your health care provider. Stretching and range of motion exercises These exercises warm up your muscles and joints and improve the movement and flexibility of your foot. These exercises also help to relieve pain. Exercise A: Plantar fascia stretch  1. Sit with your left / right leg crossed over your opposite knee. 2. Hold your heel with one hand with that thumb near your arch. With your other hand, hold your toes and gently pull them back toward the top of your foot. You should feel a stretch on the bottom of your toes or your foot or both. 3. Hold this stretch for__________ seconds. 4. Slowly release your toes and return to the starting position. Repeat __________ times. Complete this exercise __________ times a day. Exercise B: Gastroc, standing  1. Stand with your hands against a wall. 2. Extend your left / right leg behind you, and bend your front knee slightly. 3. Keeping your heels on the floor and keeping your back knee straight, shift your weight toward the wall without arching your back. You should feel a gentle stretch in your left / right calf. 4. Hold this position for __________ seconds. Repeat __________ times. Complete this exercise __________ times a day. Exercise C: Soleus, standing 1. Stand with your hands against a wall. 2. Extend your left / right leg behind you, and bend your front knee slightly. 3. Keeping your heels on the floor, bend your back knee and slightly shift your weight over the back leg. You should feel a gentle stretch deep in your calf. 4. Hold this position for  __________ seconds. Repeat __________ times. Complete this exercise __________ times a day. Exercise D: Gastrocsoleus, standing 1. Stand with the ball of your left / right foot on a step. The ball of your foot is on the walking surface, right under your toes. 2. Keep your other foot firmly on the same step. 3. Hold onto the wall or a railing for balance. 4. Slowly lift your other foot, allowing your body weight to press your heel down over the edge of the step. You should feel a stretch in your left / right calf. 5. Hold this position for __________ seconds. 6. Return both feet to the step. 7. Repeat this exercise with a slight bend in your left / right knee. Repeat __________ times with your left / right knee straight and __________ times with your left / right knee bent. Complete this exercise __________ times a day. Balance exercise This exercise builds your balance and strength control of your arch to help take pressure off your plantar fascia. Exercise E: Single leg stand 1. Without shoes, stand near a railing or in a doorway. You may hold onto the railing or door frame as needed. 2. Stand on your left / right foot. Keep your big toe down on the floor and try to keep your arch lifted. Do not let your foot roll inward. 3. Hold this position for __________ seconds. 4. If this exercise is too easy, you can try it with your eyes closed or while standing on a pillow. Repeat __________ times. Complete  this exercise __________ times a day. This information is not intended to replace advice given to you by your health care provider. Make sure you discuss any questions you have with your health care provider. Document Released: 08/11/2005 Document Revised: 04/15/2016 Document Reviewed: 06/25/2015 Elsevier Interactive Patient Education  2018 [place] STEROID USE INSTRUCTIONS  Step 1. Prepare the nose. Blow the nose before administering the drug.  Step 2. Prime and activate the  delivery device as recommended by the manufacturer.  Step 3. Position the head by tilting the head forward.  Step 4. Insert the tip of the applicator gently, avoiding contact with the septum.  Step 5. Aim the applicator tip about 45 from the floor of the nose and direct it at the outer corner of the eye on the same side to avoid traumatizing or spraying the septum.  Step 6. Close the other nostril gently with a finger.   Step 7. Sniff or inhale gently while delivering the drug.

## 2017-03-26 NOTE — Progress Notes (Signed)
  Subjective:   Patient ID: Lilymarie Scroggins, female    DOB: 28-Aug-1940, 76 y.o.   MRN: [id number] CC: Ankle Pain (right) and Foot Pain (Right)  HPI: Susan Contreras is a 76 y.o. female presenting for Ankle Pain (right) and Foot Pain (Right)  Noticed heel pain apprx 3 mo ago Comes and goes Worse for the last week Feels better when wearing bedroom shoes with soft sole No pain in the morning when she first gets up Gets progressivley worse throughout the day, depending on how much walking and standing she does Using aspercream and voltaren gel at night, has been helping No known injury  Feels ear fullness b/l, some congestion, has had allergy symptoms recently No fevers Feeling well otherwise Using flonase, norel AD regularly  Relevant past medical, surgical, family and social history reviewed. Allergies and medications reviewed and updated. History  Smoking Status  . Never Smoker  Smokeless Tobacco  . Never Used   ROS: Per HPI   Objective:    BP 124/78   Pulse 70   Temp (!) 97.3 F (36.3 C) (Oral)   Ht 5\' 1"  (1.549 m)   Wt 184 lb 9.6 oz (83.7 kg)   BMI 34.88 kg/m   Wt Readings from Last 3 Encounters:  03/26/17 184 lb 9.6 oz (83.7 kg)  02/03/17 184 lb (83.5 kg)  12/22/16 187 lb 3.2 oz (84.9 kg)    Gen: NAD, alert, cooperative with exam, NCAT EYES: EOMI, no conjunctival injection, or no icterus ENT:  TMs b/l dull, OP without erythema LYMPH: no cervical LAD CV: NRRR, normal S1/S2, no murmur, distal pulses 2+ b/l Resp: CTABL, no wheezes, normal WOB Ext: No edema, warm Neuro: Alert and oriented MSK: no swelling R ankle/foot compared with L. No redness.  R foot with pain with palpation medial calcaneus, some pain with heel squeeze, no pain with achilles squeeze Normal ROM R ankle No point tenderness over lateral/medial malleolus  Assessment & Plan:  Jocilynn was seen today for ankle pain and foot pain.  Diagnoses and all orders for this visit:  Acute right ankle  pain Pain of right heel Cont aspercream, can do TID Discussed options, referral for further eval Says it is not bothering her enough now Will do gentle stretches/exercises for plantar fasciitis If not improving refer to [facility], her husband follows there -     DG Foot Complete Right; Future -     DG Ankle Complete Right; Future  Allergic rhinitis due to other allergic trigger, unspecified seasonality Discussed correct flonase usage, use daily antihistamine  Follow up plan: Return if symptoms worsen or fail to improve. Assunta Found, MD [place]

## 2017-03-31 ENCOUNTER — Telehealth: Payer: Self-pay | Admitting: Family

## 2017-04-01 NOTE — Telephone Encounter (Signed)
No change from what we discussed in clinic. No fracture. Small heel spur.

## 2017-04-20 NOTE — Telephone Encounter (Signed)
Patient aware of results.

## 2017-08-11 ENCOUNTER — Encounter: Payer: Self-pay | Admitting: Pediatrics

## 2017-08-11 ENCOUNTER — Ambulatory Visit: Payer: Medicare HMO | Admitting: Pediatrics

## 2017-08-11 VITALS — BP 122/76 | HR 79 | Temp 97.5°F | Ht 61.0 in | Wt 182.4 lb

## 2017-08-11 DIAGNOSIS — I10 Essential (primary) hypertension: Secondary | ICD-10-CM

## 2017-08-11 DIAGNOSIS — E559 Vitamin D deficiency, unspecified: Secondary | ICD-10-CM | POA: Diagnosis not present

## 2017-08-11 DIAGNOSIS — M722 Plantar fascial fibromatosis: Secondary | ICD-10-CM | POA: Diagnosis not present

## 2017-08-11 DIAGNOSIS — E785 Hyperlipidemia, unspecified: Secondary | ICD-10-CM | POA: Diagnosis not present

## 2017-08-11 NOTE — Progress Notes (Signed)
  Subjective:   Patient ID: Susan Contreras, female    DOB: 03-Apr-1941, 76 y.o.   MRN: [id number] CC: Foot Pain (Right)  HPI: Susan Contreras is a 76 y.o. female presenting for Foot Pain (Right)  Pain in her R foot, back of her heel and arch of her foot Bothering her more recently Tries to wear shoes with great arch supports Using Aspercreme sometimes at night with good relief of symptoms  No fevers, says sometimes she thinks her ankle is swollen  Vitamin D: Taking replacement regularly  Hyperlipidemia: Open to being on a statin, will recheck cholesterol levels today, elevated in the past, patient was not fasting  Hypertension: No headaches, shortness of breath with exertion Taking medicine regularly  Relevant past medical, surgical, family and social history reviewed. Allergies and medications reviewed and updated. Social History   Tobacco Use  Smoking Status Never Smoker  Smokeless Tobacco Never Used   ROS: Per HPI   Objective:    BP 122/76   Pulse 79   Temp (!) 97.5 F (36.4 C) (Oral)   Ht _0  (1.549 m)   Wt 182 lb 6.4 oz (82.7 kg)   BMI 34.46 kg/m   Wt Readings from Last 3 Encounters:  08/11/17 182 lb 6.4 oz (82.7 kg)  03/26/17 184 lb 9.6 oz (83.7 kg)  02/03/17 184 lb (83.5 kg)    Gen: NAD, alert, cooperative with exam, NCAT EYES: EOMI, no conjunctival injection, or no icterus ENT:  OP without erythema LYMPH: no cervical LAD CV: NRRR, normal S1/S2, no murmur, distal pulses 2+ b/l Resp: CTABL, no wheezes, normal WOB Abd: +BS, soft, NTND Ext: No edema, warm Neuro: Alert and oriented MSK:  right Achilles tendon with squeeze, medial anterior heel ttp Fairly high arches, some fall in arch height with weight bearing  Assessment & Plan:  [name] was seen today for foot pain.  Diagnoses and all orders for this visit:  Hyperlipidemia, unspecified hyperlipidemia type Pt open to statin indicated -     Lipid panel  Essential hypertension Stable, continue ARB -      BMP8+EGFR  Vitamin D deficiency -     VITAMIN D 25 Hydroxy (Vit-D Deficiency, Fractures)  Plantar fasciitis Tried arch support, good shoes Discussed gentle stretching of achilles tendon -     Ambulatory referral to Orthopedic Surgery   Follow up plan: Return in about 3 months (around 11/09/2017). Assunta Found, MD [place]

## 2017-08-12 LAB — LIPID PANEL
CHOL/HDL RATIO: 4.6 ratio — AB (ref 0.0–4.4)
Cholesterol, Total: 209 mg/dL — ABNORMAL HIGH (ref 100–199)
HDL: 45 mg/dL (ref >39)
LDL Calculated: 110 mg/dL — ABNORMAL HIGH (ref 0–99)
TRIGLYCERIDES: 270 mg/dL — AB (ref 0–149)
VLDL Cholesterol Cal: 54 mg/dL — ABNORMAL HIGH (ref 5–40)

## 2017-08-12 LAB — VITAMIN D 25 HYDROXY (VIT D DEFICIENCY, FRACTURES): Vit D, 25-Hydroxy: 18.7 ng/mL — ABNORMAL LOW (ref 30.0–100.0)

## 2017-08-12 LAB — BMP8+EGFR
BUN / CREAT RATIO: 11 — AB (ref 12–28)
BUN: 14 mg/dL (ref 8–27)
CO2: 21 mmol/L (ref 20–29)
Calcium: 9.8 mg/dL (ref 8.7–10.3)
Chloride: 100 mmol/L (ref 96–106)
Creatinine, Ser: 1.31 mg/dL — ABNORMAL HIGH (ref 0.57–1.00)
GFR calc Af Amer: 46 mL/min/{1.73_m2} — ABNORMAL LOW (ref >59)
GFR calc non Af Amer: 40 mL/min/{1.73_m2} — ABNORMAL LOW (ref >59)
GLUCOSE: 95 mg/dL (ref 65–99)
POTASSIUM: 4.9 mmol/L (ref 3.5–5.2)
SODIUM: 138 mmol/L (ref 134–144)

## 2017-08-14 ENCOUNTER — Telehealth: Payer: Self-pay | Admitting: Family

## 2017-08-20 NOTE — Telephone Encounter (Signed)
Attempted to contact patient  And no answer - labs have not been reviewed yet

## 2017-08-21 ENCOUNTER — Other Ambulatory Visit: Payer: Self-pay | Admitting: Family

## 2017-08-21 DIAGNOSIS — I10 Essential (primary) hypertension: Secondary | ICD-10-CM

## 2017-08-27 ENCOUNTER — Encounter: Payer: Self-pay | Admitting: Pediatrics

## 2017-08-27 ENCOUNTER — Ambulatory Visit (INDEPENDENT_AMBULATORY_CARE_PROVIDER_SITE_OTHER): Payer: Medicare HMO | Admitting: Pediatrics

## 2017-08-27 VITALS — BP 130/88 | HR 90 | Temp 96.9°F | Ht 61.0 in | Wt 181.2 lb

## 2017-08-27 DIAGNOSIS — J329 Chronic sinusitis, unspecified: Secondary | ICD-10-CM

## 2017-08-27 DIAGNOSIS — E785 Hyperlipidemia, unspecified: Secondary | ICD-10-CM

## 2017-08-27 DIAGNOSIS — M79673 Pain in unspecified foot: Secondary | ICD-10-CM | POA: Diagnosis not present

## 2017-08-27 MED ORDER — AMOXICILLIN 500 MG PO CAPS: 500.0000 mg | ORAL_CAPSULE | Freq: Three times a day (TID) | ORAL | 0 refills | Status: AC

## 2017-08-27 MED ORDER — PRAVASTATIN SODIUM 10 MG PO TABS: 10.0000 mg | ORAL_TABLET | Freq: Every day | ORAL | 5 refills | Status: DC

## 2017-08-27 NOTE — Progress Notes (Signed)
  Subjective:   Patient ID: Susan Contreras, female    DOB: [birth year], 77 y.o.   MRN: [id number] CC: Headache (1 week, ); Nasal Congestion; and Facial Pain  HPI: Susan Contreras is a 77 y.o. female presenting for Headache (1 week, ); Nasal Congestion; and Facial Pain  URI symptoms started about a week ago Today feeling worse and she has for the last couple of days Continues to have sinus drainage, sinus pressure, facial pain Appetite has been down No fevers that she knows of No coughing, no sore throat  Hyperlipidemia: Not currently on a statin  Relevant past medical, surgical, family and social history reviewed. Allergies and medications reviewed and updated. Social History   Tobacco Use  Smoking Status Never Smoker  Smokeless Tobacco Never Used   ROS: Per HPI   Objective:    BP 130/88   Pulse 90   Temp (!) 96.9 F (36.1 C) (Oral)   Ht 5\' 1"  (1.549 m)   Wt 181 lb 3.2 oz (82.2 kg)   BMI 34.24 kg/m   Wt Readings from Last 3 Encounters:  08/27/17 181 lb 3.2 oz (82.2 kg)  08/11/17 182 lb 6.4 oz (82.7 kg)  03/26/17 184 lb 9.6 oz (83.7 kg)    Gen: NAD, alert, cooperative with exam, NCAT EYES: EOMI, no conjunctival injection, or no icterus ENT:  TMs dull gray b/l, OP with mild erythema, ttp right maxillary and frontal sinuses LYMPH: no cervical LAD CV: NRRR, normal S1/S2 Resp: CTABL, no wheezes, normal WOB Abd: +BS, soft, NTND. no guarding or organomegaly Ext: No edema, warm Neuro: Alert and oriented  Assessment & Plan:  Marci was seen today for headache, nasal congestion and facial pain.  Diagnoses and all orders for this visit:  Sinusitis, unspecified chronicity, unspecified location -     amoxicillin (AMOXIL) 500 MG capsule; Take 1 capsule (500 mg total) by mouth 3 (three) times daily for 7 days.  Hyperlipidemia, unspecified hyperlipidemia type ASCVD 10 yr risk 19.8% -     pravastatin (PRAVACHOL) 10 MG tablet; Take 1 tablet (10 mg total) by mouth at  bedtime.  Pain of foot, unspecified laterality Evaluated last visit, suspect plantar fasciitis Recommended better arch support in shoes, patient wants to see podiatry -     Ambulatory referral to Podiatry   Follow up plan: Return in about 3 months (around 11/25/2017). Assunta Found, MD [place]

## 2017-09-02 ENCOUNTER — Other Ambulatory Visit: Payer: Self-pay | Admitting: Family

## 2017-09-15 DIAGNOSIS — M722 Plantar fascial fibromatosis: Secondary | ICD-10-CM | POA: Diagnosis not present

## 2017-09-15 DIAGNOSIS — M79671 Pain in right foot: Secondary | ICD-10-CM | POA: Diagnosis not present

## 2017-10-13 DIAGNOSIS — M79671 Pain in right foot: Secondary | ICD-10-CM | POA: Diagnosis not present

## 2017-10-13 DIAGNOSIS — M722 Plantar fascial fibromatosis: Secondary | ICD-10-CM | POA: Diagnosis not present

## 2017-10-23 ENCOUNTER — Other Ambulatory Visit: Payer: Self-pay | Admitting: Family

## 2017-10-23 DIAGNOSIS — F411 Generalized anxiety disorder: Secondary | ICD-10-CM

## 2017-11-16 ENCOUNTER — Other Ambulatory Visit: Payer: Self-pay | Admitting: Family

## 2017-11-16 DIAGNOSIS — E039 Hypothyroidism, unspecified: Secondary | ICD-10-CM

## 2017-11-26 ENCOUNTER — Ambulatory Visit (INDEPENDENT_AMBULATORY_CARE_PROVIDER_SITE_OTHER): Payer: Medicare HMO | Admitting: Family

## 2017-11-26 ENCOUNTER — Encounter: Payer: Self-pay | Admitting: Family

## 2017-11-26 VITALS — BP 132/77 | HR 73 | Temp 97.2°F | Ht 61.0 in | Wt 181.0 lb

## 2017-11-26 DIAGNOSIS — J0111 Acute recurrent frontal sinusitis: Secondary | ICD-10-CM

## 2017-11-26 DIAGNOSIS — Z1212 Encounter for screening for malignant neoplasm of rectum: Secondary | ICD-10-CM

## 2017-11-26 DIAGNOSIS — F411 Generalized anxiety disorder: Secondary | ICD-10-CM

## 2017-11-26 DIAGNOSIS — E669 Obesity, unspecified: Secondary | ICD-10-CM

## 2017-11-26 DIAGNOSIS — Z1211 Encounter for screening for malignant neoplasm of colon: Secondary | ICD-10-CM

## 2017-11-26 MED ORDER — ALPRAZOLAM 0.25 MG PO TABS
ORAL_TABLET | ORAL | 1 refills | Status: DC
Start: 2017-11-26 — End: 2018-04-19

## 2017-11-26 MED ORDER — AMOXICILLIN 500 MG PO CAPS: 500.0000 mg | ORAL_CAPSULE | Freq: Two times a day (BID) | ORAL | 0 refills | Status: DC

## 2017-11-26 NOTE — Patient Instructions (Signed)

## 2017-11-26 NOTE — Progress Notes (Signed)
   Subjective:    Patient ID: Susan Contreras, female    DOB: 12-22-1940, 77 y.o.   MRN: [id number]  Sinusitis  This is a recurrent problem. The current episode started 1 to 4 weeks ago. The problem has been gradually worsening since onset. There has been no fever. Her pain is at a severity of 9/10. The pain is moderate. Associated symptoms include congestion, ear pain, headaches, sinus pressure, sneezing and a sore throat. Past treatments include oral decongestants. The treatment provided mild relief.  Anxiety  Presents for follow-up visit. Symptoms include excessive worry, irritability, nervous/anxious behavior and restlessness. Symptoms occur most days.        Review of Systems  Constitutional: Positive for irritability.  HENT: Positive for congestion, ear pain, sinus pressure, sneezing and sore throat.   Neurological: Positive for headaches.  Psychiatric/Behavioral: The patient is nervous/anxious.   All other systems reviewed and are negative.      Objective:   Physical Exam  Constitutional: She is oriented to person, place, and time. She appears well-developed and well-nourished. No distress.  HENT:  Head: Normocephalic and atraumatic.  Right Ear: External ear normal.  Left Ear: External ear normal. Tympanic membrane is erythematous.  Nose: Mucosal edema and rhinorrhea present. Right sinus exhibits frontal sinus tenderness. Left sinus exhibits frontal sinus tenderness.  Mouth/Throat: Oropharynx is clear and moist.  Eyes: Pupils are equal, round, and reactive to light.  Neck: Normal range of motion. Neck supple. No thyromegaly present.  Cardiovascular: Normal rate, regular rhythm, normal heart sounds and intact distal pulses.  No murmur heard. Pulmonary/Chest: Effort normal and breath sounds normal. No respiratory distress. She has no wheezes.  Abdominal: Soft. Bowel sounds are normal. She exhibits no distension. There is no tenderness.  Musculoskeletal: Normal range of motion.  She exhibits no edema or tenderness.  Neurological: She is alert and oriented to person, place, and time. She has normal reflexes. No cranial nerve deficit.  Skin: Skin is warm and dry.  Psychiatric: She has a normal mood and affect. Her behavior is normal. Judgment and thought content normal.  Vitals reviewed.     BP 132/77   Pulse 73   Temp (!) 97.2 F (36.2 C) (Oral)   Ht 5\' 1"  (1.549 m)   Wt 181 lb (82.1 kg)   BMI 34.20 kg/m '    Assessment & Plan:  1. Acute recurrent frontal sinusitis - Take meds as prescribed - Use a cool mist humidifier  -Use saline nose sprays frequently -Force fluids -For any cough or congestion  Use plain Mucinex- regular strength or max strength is fine -For fever or aces or pains- take tylenol or ibuprofen. -Throat lozenges if help -New toothbrush in 3 days - amoxicillin (AMOXIL) 500 MG capsule; Take 1 capsule (500 mg total) by mouth 2 (two) times daily.  Dispense: 14 capsule; Refill: 0  2. Obesity (BMI 30.0-34.9) - amoxicillin (AMOXIL) 500 MG capsule; Take 1 capsule (500 mg total) by mouth 2 (two) times daily.  Dispense: 14 capsule; Refill: 0  3. GAD (generalized anxiety disorder) - ALPRAZolam (XANAX) 0.25 MG tablet; TAKE 1 TABLET AT BEDTIME AS NEEDED FOR ANXIETY  Dispense: 30 tablet; Refill: 1  4. Colon cancer screening - Cologuard  5. Screening for malignant neoplasm of the rectum - Cologuard    [name], FNP

## 2017-12-02 ENCOUNTER — Other Ambulatory Visit: Payer: Self-pay | Admitting: Physician Assistant

## 2017-12-02 DIAGNOSIS — D229 Melanocytic nevi, unspecified: Secondary | ICD-10-CM | POA: Diagnosis not present

## 2017-12-02 DIAGNOSIS — D485 Neoplasm of uncertain behavior of skin: Secondary | ICD-10-CM | POA: Diagnosis not present

## 2017-12-02 DIAGNOSIS — L820 Inflamed seborrheic keratosis: Secondary | ICD-10-CM | POA: Diagnosis not present

## 2017-12-17 ENCOUNTER — Telehealth: Payer: Self-pay | Admitting: Family

## 2017-12-23 NOTE — Telephone Encounter (Signed)
No action is needed at this time. She will not be billed if the kit is not returned to [facility].

## 2018-01-07 DIAGNOSIS — D229 Melanocytic nevi, unspecified: Secondary | ICD-10-CM | POA: Diagnosis not present

## 2018-01-07 DIAGNOSIS — L821 Other seborrheic keratosis: Secondary | ICD-10-CM | POA: Diagnosis not present

## 2018-01-19 DIAGNOSIS — M722 Plantar fascial fibromatosis: Secondary | ICD-10-CM | POA: Diagnosis not present

## 2018-01-19 DIAGNOSIS — M79671 Pain in right foot: Secondary | ICD-10-CM | POA: Diagnosis not present

## 2018-03-02 ENCOUNTER — Ambulatory Visit (INDEPENDENT_AMBULATORY_CARE_PROVIDER_SITE_OTHER): Payer: Medicare HMO | Admitting: Family

## 2018-03-02 ENCOUNTER — Encounter: Payer: Self-pay | Admitting: Family

## 2018-03-02 ENCOUNTER — Other Ambulatory Visit: Payer: Self-pay | Admitting: Family

## 2018-03-02 ENCOUNTER — Other Ambulatory Visit: Payer: Self-pay | Admitting: *Deleted

## 2018-03-02 VITALS — BP 105/69 | HR 82 | Temp 98.0°F | Ht 61.0 in | Wt 179.2 lb

## 2018-03-02 DIAGNOSIS — E66811 Obesity, class 1: Secondary | ICD-10-CM

## 2018-03-02 DIAGNOSIS — E8881 Metabolic syndrome: Secondary | ICD-10-CM

## 2018-03-02 DIAGNOSIS — E559 Vitamin D deficiency, unspecified: Secondary | ICD-10-CM

## 2018-03-02 DIAGNOSIS — K2100 Gastro-esophageal reflux disease with esophagitis, without bleeding: Secondary | ICD-10-CM

## 2018-03-02 DIAGNOSIS — E039 Hypothyroidism, unspecified: Secondary | ICD-10-CM

## 2018-03-02 DIAGNOSIS — F411 Generalized anxiety disorder: Secondary | ICD-10-CM | POA: Diagnosis not present

## 2018-03-02 DIAGNOSIS — J301 Allergic rhinitis due to pollen: Secondary | ICD-10-CM | POA: Diagnosis not present

## 2018-03-02 DIAGNOSIS — J3089 Other allergic rhinitis: Secondary | ICD-10-CM | POA: Diagnosis not present

## 2018-03-02 DIAGNOSIS — I10 Essential (primary) hypertension: Secondary | ICD-10-CM

## 2018-03-02 DIAGNOSIS — E785 Hyperlipidemia, unspecified: Secondary | ICD-10-CM

## 2018-03-02 DIAGNOSIS — E669 Obesity, unspecified: Secondary | ICD-10-CM | POA: Diagnosis not present

## 2018-03-02 MED ORDER — MONTELUKAST SODIUM 10 MG PO TABS: 10.0000 mg | ORAL_TABLET | Freq: Every day | ORAL | 3 refills | Status: DC

## 2018-03-02 NOTE — Patient Instructions (Addendum)

## 2018-03-02 NOTE — Progress Notes (Signed)
Subjective:    Patient ID: Susan Contreras, female    DOB: Jul 13, 1941, 77 y.o.   MRN: [id number]  Chief Complaint  Patient presents with  . Medical Management of Chronic Issues    lab work due and refills    Hypertension  This is a chronic problem. The current episode started more than 1 year ago. The problem has been resolved since onset. The problem is controlled. Associated symptoms include malaise/fatigue and shortness of breath ("at times when I walk far"). Pertinent negatives include no headaches or peripheral edema. Risk factors for coronary artery disease include dyslipidemia and obesity. The current treatment provides moderate improvement. Identifiable causes of hypertension include a thyroid problem.  Thyroid Problem  Presents for follow-up visit. Symptoms include constipation, diarrhea, fatigue and hoarse voice. The symptoms have been stable. Her past medical history is significant for hyperlipidemia.  Gastroesophageal Reflux  She complains of abdominal pain, belching, heartburn, a hoarse voice and nausea. She reports no coughing. This is a chronic problem. The current episode started more than 1 year ago. The problem occurs occasionally. The problem has been waxing and waning. The symptoms are aggravated by certain foods. Associated symptoms include fatigue. She has tried a PPI for the symptoms. The treatment provided moderate relief.  Hyperlipidemia  This is a chronic problem. The current episode started more than 1 year ago. The problem is uncontrolled. Recent lipid tests were reviewed and are high. Exacerbating diseases include obesity. Associated symptoms include shortness of breath ("at times when I walk far"). Current antihyperlipidemic treatment includes statins. The current treatment provides moderate improvement of lipids. Risk factors for coronary artery disease include dyslipidemia, hypertension and a sedentary lifestyle.  Abdominal Pain  The pain is located in the  generalized abdominal region. Associated symptoms include belching, constipation, diarrhea, flatus and nausea. Pertinent negatives include no headaches. Her past medical history is significant for GERD.  Metabolic Syndrome  PT states she tries to eat healthy, but is not active.    Review of Systems  Constitutional: Positive for fatigue and malaise/fatigue.  HENT: Positive for hoarse voice.   Respiratory: Positive for shortness of breath ("at times when I walk far"). Negative for cough.   Gastrointestinal: Positive for abdominal pain, constipation, diarrhea, flatus, heartburn and nausea.  Neurological: Negative for headaches.  All other systems reviewed and are negative.      Objective:   Physical Exam  Constitutional: She is oriented to person, place, and time. She appears well-developed and well-nourished. No distress.  HENT:  Head: Normocephalic and atraumatic.  Right Ear: External ear normal.  Left Ear: External ear normal.  Mouth/Throat: Oropharynx is clear and moist.  Eyes: Pupils are equal, round, and reactive to light.  Neck: Normal range of motion. Neck supple. No thyromegaly present.  Cardiovascular: Normal rate, regular rhythm, normal heart sounds and intact distal pulses.  No murmur heard. Pulmonary/Chest: Effort normal and breath sounds normal. No respiratory distress. She has no wheezes.  Abdominal: Soft. Bowel sounds are normal. She exhibits no distension. There is no tenderness.  Musculoskeletal: Normal range of motion. She exhibits no edema or tenderness.  Neurological: She is alert and oriented to person, place, and time. She has normal reflexes. No cranial nerve deficit.  Skin: Skin is warm and dry.  Psychiatric: She has a normal mood and affect. Her behavior is normal. Judgment and thought content normal.  Vitals reviewed.     BP 105/69   Pulse 82   Temp 98 F (36.7 C) (Oral)  Ht 5' 1"  (1.549 m)   Wt 179 lb 3.2 oz (81.3 kg)   BMI 33.86 kg/m        Assessment & Plan:  Ardenia Stiner comes in today with chief complaint of Medical Management of Chronic Issues (lab work due and refills)   Diagnosis and orders addressed:  1. Essential hypertension - CMP14+EGFR - CBC with Differential/Platelet  2. GAD (generalized anxiety disorder) - CMP14+EGFR - CBC with Differential/Platelet  3. Gastroesophageal reflux disease with esophagitis Encouraged pt to start Protonix BID Does not want to see GI at this time - CMP14+EGFR - CBC with Differential/Platelet  4. Hyperlipidemia, unspecified hyperlipidemia type - CMP14+EGFR - CBC with Differential/Platelet - Lipid panel  5. Hypothyroidism, unspecified type - CMP14+EGFR - CBC with Differential/Platelet - TSH  6. Obesity (BMI 30.0-34.9) - CMP14+EGFR - CBC with Differential/Platelet  7. Vitamin D deficiency - CMP14+EGFR - CBC with Differential/Platelet - VITAMIN D 25 Hydroxy (Vit-D Deficiency, Fractures)  8. Metabolic syndrome - HOY43+XUCJ - CBC with Differential/Platelet  9. Allergic rhinitis due to other allergic trigger, unspecified seasonality Will start Singulair today  Continue flonase Force fluids - montelukast (SINGULAIR) 10 MG tablet; Take 1 tablet (10 mg total) by mouth at bedtime.  Dispense: 30 tablet; Refill: 3  10. Allergic rhinitis due to pollen, unspecified seasonality   Labs pending Health Maintenance reviewed Diet and exercise encouraged  Follow up plan: 3 months    [name], FNP

## 2018-03-03 ENCOUNTER — Other Ambulatory Visit: Payer: Self-pay | Admitting: Family

## 2018-03-03 ENCOUNTER — Other Ambulatory Visit: Payer: Self-pay | Admitting: *Deleted

## 2018-03-03 DIAGNOSIS — R7989 Other specified abnormal findings of blood chemistry: Secondary | ICD-10-CM

## 2018-03-03 DIAGNOSIS — E875 Hyperkalemia: Secondary | ICD-10-CM

## 2018-03-03 LAB — LIPID PANEL
Chol/HDL Ratio: 5.3 ratio — ABNORMAL HIGH (ref 0.0–4.4)
Cholesterol, Total: 234 mg/dL — ABNORMAL HIGH (ref 100–199)
HDL: 44 mg/dL (ref >39)
LDL CALC: 126 mg/dL — AB (ref 0–99)
TRIGLYCERIDES: 318 mg/dL — AB (ref 0–149)
VLDL Cholesterol Cal: 64 mg/dL — ABNORMAL HIGH (ref 5–40)

## 2018-03-03 LAB — CBC WITH DIFFERENTIAL/PLATELET
Basophils Absolute: 0.1 10*3/uL (ref 0.0–0.2)
Basos: 1 %
EOS (ABSOLUTE): 0.3 10*3/uL (ref 0.0–0.4)
Eos: 3 %
HEMATOCRIT: 41.2 % (ref 34.0–46.6)
HEMOGLOBIN: 13.1 g/dL (ref 11.1–15.9)
IMMATURE GRANS (ABS): 0 10*3/uL (ref 0.0–0.1)
IMMATURE GRANULOCYTES: 0 %
LYMPHS: 35 %
Lymphocytes Absolute: 3.6 10*3/uL — ABNORMAL HIGH (ref 0.7–3.1)
MCH: 28.5 pg (ref 26.6–33.0)
MCHC: 31.8 g/dL (ref 31.5–35.7)
MCV: 90 fL (ref 79–97)
MONOCYTES: 6 %
Monocytes Absolute: 0.6 10*3/uL (ref 0.1–0.9)
NEUTROS PCT: 55 %
Neutrophils Absolute: 5.9 10*3/uL (ref 1.4–7.0)
Platelets: 344 10*3/uL (ref 150–450)
RBC: 4.6 x10E6/uL (ref 3.77–5.28)
RDW: 14.3 % (ref 12.3–15.4)
WBC: 10.4 10*3/uL (ref 3.4–10.8)

## 2018-03-03 LAB — CMP14+EGFR
A/G RATIO: 1.4 (ref 1.2–2.2)
ALT: 14 IU/L (ref 0–32)
AST: 19 IU/L (ref 0–40)
Albumin: 4.6 g/dL (ref 3.5–4.8)
Alkaline Phosphatase: 87 IU/L (ref 39–117)
BILIRUBIN TOTAL: 0.6 mg/dL (ref 0.0–1.2)
BUN/Creatinine Ratio: 7 — ABNORMAL LOW (ref 12–28)
BUN: 12 mg/dL (ref 8–27)
CHLORIDE: 99 mmol/L (ref 96–106)
CO2: 23 mmol/L (ref 20–29)
Calcium: 10 mg/dL (ref 8.7–10.3)
Creatinine, Ser: 1.63 mg/dL — ABNORMAL HIGH (ref 0.57–1.00)
GFR calc Af Amer: 35 mL/min/{1.73_m2} — ABNORMAL LOW (ref >59)
GFR calc non Af Amer: 31 mL/min/{1.73_m2} — ABNORMAL LOW (ref >59)
GLUCOSE: 97 mg/dL (ref 65–99)
Globulin, Total: 3.4 g/dL (ref 1.5–4.5)
POTASSIUM: 6.1 mmol/L — AB (ref 3.5–5.2)
Sodium: 137 mmol/L (ref 134–144)
Total Protein: 8 g/dL (ref 6.0–8.5)

## 2018-03-03 LAB — VITAMIN D 25 HYDROXY (VIT D DEFICIENCY, FRACTURES): Vit D, 25-Hydroxy: 23.1 ng/mL — ABNORMAL LOW (ref 30.0–100.0)

## 2018-03-03 LAB — TSH: TSH: 2.26 u[IU]/mL (ref 0.450–4.500)

## 2018-03-03 MED ORDER — ATORVASTATIN CALCIUM 20 MG PO TABS: 20.0000 mg | ORAL_TABLET | Freq: Every day | ORAL | 3 refills | Status: DC

## 2018-03-04 ENCOUNTER — Other Ambulatory Visit: Payer: Medicare HMO

## 2018-03-04 DIAGNOSIS — R7989 Other specified abnormal findings of blood chemistry: Secondary | ICD-10-CM | POA: Diagnosis not present

## 2018-03-04 DIAGNOSIS — E875 Hyperkalemia: Secondary | ICD-10-CM

## 2018-03-04 LAB — CMP14+EGFR
ALBUMIN: 4.4 g/dL (ref 3.5–4.8)
ALT: 12 IU/L (ref 0–32)
AST: 15 IU/L (ref 0–40)
Albumin/Globulin Ratio: 1.5 (ref 1.2–2.2)
Alkaline Phosphatase: 82 IU/L (ref 39–117)
BUN / CREAT RATIO: 8 — AB (ref 12–28)
BUN: 12 mg/dL (ref 8–27)
Bilirubin Total: 0.5 mg/dL (ref 0.0–1.2)
CALCIUM: 9.2 mg/dL (ref 8.7–10.3)
CO2: 21 mmol/L (ref 20–29)
Chloride: 100 mmol/L (ref 96–106)
Creatinine, Ser: 1.57 mg/dL — ABNORMAL HIGH (ref 0.57–1.00)
GFR, EST AFRICAN AMERICAN: 37 mL/min/{1.73_m2} — AB (ref >59)
GFR, EST NON AFRICAN AMERICAN: 32 mL/min/{1.73_m2} — AB (ref >59)
GLUCOSE: 91 mg/dL (ref 65–99)
Globulin, Total: 3 g/dL (ref 1.5–4.5)
Potassium: 4.6 mmol/L (ref 3.5–5.2)
Sodium: 138 mmol/L (ref 134–144)
TOTAL PROTEIN: 7.4 g/dL (ref 6.0–8.5)

## 2018-03-05 ENCOUNTER — Other Ambulatory Visit: Payer: Self-pay | Admitting: Family

## 2018-03-05 MED ORDER — AMOXICILLIN 500 MG PO CAPS
500.0000 mg | ORAL_CAPSULE | Freq: Two times a day (BID) | ORAL | 0 refills | Status: DC
Start: 2018-03-05 — End: 2018-05-19

## 2018-03-05 NOTE — Telephone Encounter (Signed)
I sent amoxicillin because it appears that the patient is allergic to almost everything else. Caryl Pina, MD [facility] 03/05/2018, 4:19 PM

## 2018-03-05 NOTE — Telephone Encounter (Signed)
Patient aware.

## 2018-03-05 NOTE — Telephone Encounter (Signed)
Patient was seen Wednesday and diagnosed with allergic rhinitis .  Please advise if medicine will be ordered.

## 2018-03-08 ENCOUNTER — Other Ambulatory Visit: Payer: Self-pay | Admitting: Family

## 2018-03-08 DIAGNOSIS — E039 Hypothyroidism, unspecified: Secondary | ICD-10-CM

## 2018-03-10 ENCOUNTER — Emergency Department (HOSPITAL_COMMUNITY): Payer: Medicare HMO

## 2018-03-10 ENCOUNTER — Emergency Department (HOSPITAL_COMMUNITY)
Admission: EM | Admit: 2018-03-10 | Discharge: 2018-03-10 | Disposition: A | Discharge status: Home / Self Care | Payer: Medicare HMO | Attending: Emergency Medicine | Admitting: Emergency Medicine

## 2018-03-10 ENCOUNTER — Other Ambulatory Visit: Payer: Self-pay

## 2018-03-10 ENCOUNTER — Telehealth: Payer: Self-pay | Admitting: Family

## 2018-03-10 ENCOUNTER — Encounter (HOSPITAL_COMMUNITY): Payer: Self-pay

## 2018-03-10 DIAGNOSIS — R1084 Generalized abdominal pain: Secondary | ICD-10-CM | POA: Diagnosis not present

## 2018-03-10 DIAGNOSIS — R0989 Other specified symptoms and signs involving the circulatory and respiratory systems: Secondary | ICD-10-CM | POA: Diagnosis not present

## 2018-03-10 DIAGNOSIS — R109 Unspecified abdominal pain: Secondary | ICD-10-CM

## 2018-03-10 DIAGNOSIS — I10 Essential (primary) hypertension: Secondary | ICD-10-CM | POA: Diagnosis not present

## 2018-03-10 DIAGNOSIS — R197 Diarrhea, unspecified: Secondary | ICD-10-CM | POA: Diagnosis not present

## 2018-03-10 DIAGNOSIS — R110 Nausea: Secondary | ICD-10-CM | POA: Diagnosis not present

## 2018-03-10 DIAGNOSIS — R531 Weakness: Secondary | ICD-10-CM | POA: Diagnosis not present

## 2018-03-10 DIAGNOSIS — R1010 Upper abdominal pain, unspecified: Secondary | ICD-10-CM | POA: Diagnosis not present

## 2018-03-10 DIAGNOSIS — Z79899 Other long term (current) drug therapy: Secondary | ICD-10-CM | POA: Insufficient documentation

## 2018-03-10 DIAGNOSIS — K2970 Gastritis, unspecified, without bleeding: Secondary | ICD-10-CM | POA: Diagnosis not present

## 2018-03-10 LAB — URINALYSIS, ROUTINE W REFLEX MICROSCOPIC
Bacteria, UA: NONE SEEN
Bilirubin Urine: NEGATIVE
GLUCOSE, UA: NEGATIVE mg/dL
Ketones, ur: NEGATIVE mg/dL
Leukocytes, UA: NEGATIVE
Nitrite: NEGATIVE
PH: 5 (ref 5.0–8.0)
Protein, ur: NEGATIVE mg/dL
SPECIFIC GRAVITY, URINE: 1.006 (ref 1.005–1.030)

## 2018-03-10 LAB — CBC
HCT: 41.2 % (ref 36.0–46.0)
Hemoglobin: 13.6 g/dL (ref 12.0–15.0)
MCH: 29.7 pg (ref 26.0–34.0)
MCHC: 33 g/dL (ref 30.0–36.0)
MCV: 90 fL (ref 78.0–100.0)
PLATELETS: 290 10*3/uL (ref 150–400)
RBC: 4.58 MIL/uL (ref 3.87–5.11)
RDW: 13.8 % (ref 11.5–15.5)
WBC: 9.2 10*3/uL (ref 4.0–10.5)

## 2018-03-10 LAB — COMPREHENSIVE METABOLIC PANEL
ALK PHOS: 76 U/L (ref 38–126)
ALT: 14 U/L (ref 0–44)
AST: 21 U/L (ref 15–41)
Albumin: 4.1 g/dL (ref 3.5–5.0)
Anion gap: 9 (ref 5–15)
BUN: 11 mg/dL (ref 8–23)
CALCIUM: 9.2 mg/dL (ref 8.9–10.3)
CO2: 23 mmol/L (ref 22–32)
CREATININE: 1.5 mg/dL — AB (ref 0.44–1.00)
Chloride: 107 mmol/L (ref 98–111)
GFR calc non Af Amer: 33 mL/min — ABNORMAL LOW (ref >60)
GFR, EST AFRICAN AMERICAN: 38 mL/min — AB (ref >60)
Glucose, Bld: 100 mg/dL — ABNORMAL HIGH (ref 70–99)
Potassium: 3.8 mmol/L (ref 3.5–5.1)
SODIUM: 139 mmol/L (ref 135–145)
Total Bilirubin: 0.8 mg/dL (ref 0.3–1.2)
Total Protein: 8.1 g/dL (ref 6.5–8.1)

## 2018-03-10 LAB — TROPONIN I: Troponin I: <0.03 ng/mL (ref <0.03)

## 2018-03-10 LAB — LIPASE, BLOOD: Lipase: 28 U/L (ref 11–51)

## 2018-03-10 MED ORDER — FAMOTIDINE 20 MG PO TABS: 20.0000 mg | ORAL_TABLET | Freq: Two times a day (BID) | ORAL | 0 refills | Status: DC

## 2018-03-10 MED ORDER — IOHEXOL 300 MG/ML  SOLN
75.0000 mL | Freq: Once | INTRAMUSCULAR | Status: AC | PRN
  Administered 2018-03-10: 75 mL via INTRAVENOUS

## 2018-03-10 MED ORDER — SODIUM CHLORIDE 0.9 % IV SOLN
INTRAVENOUS | Status: DC
  Administered 2018-03-10: 12:00:00 via INTRAVENOUS

## 2018-03-10 MED ORDER — DICYCLOMINE HCL 20 MG PO TABS: 20.0000 mg | ORAL_TABLET | Freq: Four times a day (QID) | ORAL | 0 refills | Status: DC | PRN

## 2018-03-10 NOTE — ED Provider Notes (Signed)
[facility] EMERGENCY DEPARTMENT Provider Note   CSN: [id number] Arrival date & time: 03/10/18  1124     History   Chief Complaint Chief Complaint  Patient presents with  . Abdominal Pain    HPI Susan Contreras is a 77 y.o. female.  HPI  Pt was seen at 1200.  Per pt, c/o gradual onset and persistence of constant upper abd "pain" for the past 4 weeks, worse over the past 5 days.   Has been associated with multiple intermittent episodes of diarrhea for the past 3 weeks.  Describes the abd pain as "cramping." Describes the stools as "dark brown." Has been taking pepto bismol with "some" improvement. Pt states she has been feeling generalized weakness for the past 5 days, and she is concerned she is "dehydrated."  Denies N/V, no fevers, no back pain, no rash, no CP/SOB, no black or blood in stools.      Past Medical History:  Diagnosis Date  . Anxiety   . Depression   . Diverticulosis   . Esophageal stricture   . Family hx of colorectal cancer   . GERD (gastroesophageal reflux disease)   . Helicobacter pylori (H. pylori) 1997   EGD  . Hemorrhoids   . Hiatal hernia   . Hx of adenomatous colonic polyps   . Hyperlipemia   . Hypertension   . PUD (peptic ulcer disease) 1997   EGD  . Thyroid disease   . Vertigo     Patient Active Problem List   Diagnosis Date Noted  . Obesity (BMI 30.0-34.9) 11/26/2017  . Metabolic syndrome 81/27/5170  . GAD (generalized anxiety disorder) 08/14/2015  . Allergic rhinitis 08/14/2015  . GERD (gastroesophageal reflux disease) 08/14/2015  . Hyperlipidemia 08/14/2015  . Vitamin D deficiency 08/14/2015  . Hypothyroidism 05/29/2014  . Cholelithiasis 01/15/2011  . ADENOMATOUS COLONIC POLYP 11/11/2007  . Essential hypertension 11/11/2007  . ESOPHAGEAL STRICTURE 11/11/2007  . Diaphragmatic hernia 11/11/2007  . DIVERTICULOSIS OF COLON 11/11/2007    Past Surgical History:  Procedure Laterality Date  . ABDOMINAL HYSTERECTOMY    . BILATERAL  OOPHORECTOMY    . CHOLECYSTECTOMY       OB History   None      Home Medications    Prior to Admission medications   Medication Sig Start Date End Date Taking? Authorizing Provider  ALPRAZolam Duanne Moron) 0.25 MG tablet TAKE 1 TABLET AT BEDTIME AS NEEDED FOR ANXIETY 11/26/17  Yes Hawks, Christy A, FNP  amoxicillin (AMOXIL) 500 MG capsule Take 1 capsule (500 mg total) by mouth 2 (two) times daily. 03/05/18  Yes Dettinger, Fransisca Kaufmann, MD  atorvastatin (LIPITOR) 20 MG tablet Take 1 tablet (20 mg total) by mouth daily. 03/03/18  Yes Hawks, Christy A, FNP  Chlorphen-PE-Acetaminophen (NOREL AD PO) Take 1 capsule by mouth daily.   Yes [provider]  fluticasone (FLONASE) 50 MCG/ACT nasal spray Place 1 spray into both nostrils daily. 02/03/17  Yes Hawks, Alyse Low A, FNP  irbesartan (AVAPRO) 75 MG tablet TAKE 1 TABLET DAILY 03/02/18  Yes Hawks, Christy A, FNP  levothyroxine (SYNTHROID, LEVOTHROID) 75 MCG tablet TAKE (1) TABLET DAILY BE- FORE BREAKFAST. 03/09/18  Yes Hawks, Christy A, FNP  meclizine (ANTIVERT) 25 MG tablet TAKE ONE TABLET BY MOUTH THREE TIMES DAILY AS NEEDED FOR DIZZINESS 09/02/17  Yes Hawks, Christy A, FNP  montelukast (SINGULAIR) 10 MG tablet Take 1 tablet (10 mg total) by mouth at bedtime. 03/02/18  Yes Hawks, Christy A, FNP  pantoprazole (PROTONIX) 40 MG tablet TAKE (  1) TABLET TWICE A DAY. 03/02/18  Yes Hawks, Theador Hawthorne, FNP    Family History Family History  Problem Relation Age of Onset  . Colon cancer Sister   . Stroke Mother   . Heart disease Mother   . Parkinson's disease Father   . Ovarian cancer Sister   . Esophageal cancer Neg Hx   . Stomach cancer Neg Hx   . Kidney disease Neg Hx   . Liver disease Neg Hx   . Diabetes Neg Hx     Social History Social History   Tobacco Use  . Smoking status: Never Smoker  . Smokeless tobacco: Never Used  Substance Use Topics  . Alcohol use: No  . Drug use: No     Allergies   Biaxin [clarithromycin]; Cephalexin;  Ciprofloxacin; Doxycycline; Flagyl [metronidazole hcl]; Ketek [telithromycin]; and Sulfa antibiotics   Review of Systems Review of Systems ROS: Statement: All systems negative except as marked or noted in the HPI; Constitutional: Negative for fever and chills. ; ; Eyes: Negative for eye pain, redness and discharge. ; ; ENMT: Negative for ear pain, hoarseness, nasal congestion, sinus pressure and sore throat. ; ; Cardiovascular: Negative for chest pain, palpitations, diaphoresis, dyspnea and peripheral edema. ; ; Respiratory: Negative for cough, wheezing and stridor. ; ; Gastrointestinal: +abd pain, diarrhea. Negative for nausea, vomiting, blood in stool, hematemesis, jaundice and rectal bleeding. . ; ; Genitourinary: Negative for dysuria, flank pain and hematuria. ; ; Musculoskeletal: Negative for back pain and neck pain. Negative for swelling and trauma.; ; Skin: Negative for pruritus, rash, abrasions, blisters, bruising and skin lesion.; ; Neuro: Negative for headache, lightheadedness and neck stiffness. Negative for weakness, altered level of consciousness, altered mental status, extremity weakness, paresthesias, involuntary movement, seizure and syncope.       Physical Exam Updated Vital Signs BP 131/63   Pulse 75   Temp 98.3 F (36.8 C) (Oral)   Resp 18   Ht 5\' 1"  (1.549 m)   Wt 81.2 kg (179 lb)   SpO2 99%   BMI 33.82 kg/m     12:18:10 Orthostatic Vital Signs JD  Orthostatic Lying   BP- Lying: 130/73   Pulse- Lying: 72       Orthostatic Sitting  BP- Sitting: 121/80   Pulse- Sitting: 78       Orthostatic Standing at 0 minutes  BP- Standing at 0 minutes: 137/86   Pulse- Standing at 0 minutes: 102     Physical Exam 1205: Physical examination:  Nursing notes reviewed; Vital signs and O2 SAT reviewed;  Constitutional: Well developed, Well nourished, Well hydrated, In no acute distress; Head:  Normocephalic, atraumatic; Eyes: EOMI, PERRL, No scleral icterus; ENMT: Mouth and  pharynx normal, Mucous membranes moist; Neck: Supple, Full range of motion, No lymphadenopathy; Cardiovascular: Regular rate and rhythm, No gallop; Respiratory: Breath sounds clear & equal bilaterally, No wheezes.  Speaking full sentences with ease, Normal respiratory effort/excursion; Chest: Nontender, Movement normal; Abdomen: Soft, +mild mid-epigastric > generalized abd tenderness to palp. No rebound or guarding. Nondistended, Normal bowel sounds. Rectal exam performed w/permission of pt and ED RN chaperone present.  Anal tone normal.  Non-tender, scant soft brown stool in rectal vault, heme neg.  No fissures, no external hemorrhoids, no palp masses.;;; Genitourinary: No CVA tenderness; Extremities: Peripheral pulses normal, No tenderness, No edema, No calf edema or asymmetry.; Neuro: AA&Ox3, Major CN grossly intact.  Speech clear. No gross focal motor or sensory deficits in extremities.; Skin: Color normal, Warm, Dry.  ED Treatments / Results  Labs (all labs ordered are listed, but only abnormal results are displayed)   EKG EKG Interpretation  Date/Time:  Wednesday March 10 2018 12:16:23 EDT Ventricular Rate:  71 PR Interval:    QRS Duration: 86 QT Interval:  386 QTC Calculation: 420 R Axis:   -35 Text Interpretation:  Sinus arrhythmia Left axis deviation Inferior infarct, old Consider anterior infarct When compared with ECG of 06/16/2011 No significant change was found Confirmed by Francine Graven [phone]) on 03/10/2018 12:50:31 PM   Radiology   Procedures Procedures (including critical care time)  Medications Ordered in ED Medications  0.9 %  sodium chloride infusion ( Intravenous Stopped 03/10/18 1415)  iohexol (OMNIPAQUE) 300 MG/ML solution 75 mL (75 mLs Intravenous Contrast Given 03/10/18 1235)     Initial Impression / Assessment and Plan / ED Course  I have reviewed the triage vital signs and the nursing notes.  Pertinent labs & imaging results that were available during  my care of the patient were reviewed by me and considered in my medical decision making (see chart for details).  MDM Reviewed: previous chart, nursing note and vitals Reviewed previous: labs and ECG Interpretation: labs, ECG, x-ray and CT scan   Results for orders placed or performed during the hospital encounter of 03/10/18  Lipase, blood  Result Value Ref Range   Lipase 28 11 - 51 U/L  Comprehensive metabolic panel  Result Value Ref Range   Sodium 139 135 - 145 mmol/L   Potassium 3.8 3.5 - 5.1 mmol/L   Chloride 107 98 - 111 mmol/L   CO2 23 22 - 32 mmol/L   Glucose, Bld 100 (H) 70 - 99 mg/dL   BUN 11 8 - 23 mg/dL   Creatinine, Ser 1.50 (H) 0.44 - 1.00 mg/dL   Calcium 9.2 8.9 - 10.3 mg/dL   Total Protein 8.1 6.5 - 8.1 g/dL   Albumin 4.1 3.5 - 5.0 g/dL   AST 21 15 - 41 U/L   ALT 14 0 - 44 U/L   Alkaline Phosphatase 76 38 - 126 U/L   Total Bilirubin 0.8 0.3 - 1.2 mg/dL   GFR calc non Af Amer 33 (L) >60 mL/min   GFR calc Af Amer 38 (L) >60 mL/min   Anion gap 9 5 - 15  CBC  Result Value Ref Range   WBC 9.2 4.0 - 10.5 K/uL   RBC 4.58 3.87 - 5.11 MIL/uL   Hemoglobin 13.6 12.0 - 15.0 g/dL   HCT 41.2 36.0 - 46.0 %   MCV 90.0 78.0 - 100.0 fL   MCH 29.7 26.0 - 34.0 pg   MCHC 33.0 30.0 - 36.0 g/dL   RDW 13.8 11.5 - 15.5 %   Platelets 290 150 - 400 K/uL  Urinalysis, Routine w reflex microscopic  Result Value Ref Range   Color, Urine STRAW (A) YELLOW   APPearance CLEAR CLEAR   Specific Gravity, Urine 1.006 1.005 - 1.030   pH 5.0 5.0 - 8.0   Glucose, UA NEGATIVE NEGATIVE mg/dL   Hgb urine dipstick SMALL (A) NEGATIVE   Bilirubin Urine NEGATIVE NEGATIVE   Ketones, ur NEGATIVE NEGATIVE mg/dL   Protein, ur NEGATIVE NEGATIVE mg/dL   Nitrite NEGATIVE NEGATIVE   Leukocytes, UA NEGATIVE NEGATIVE   RBC / HPF 0-5 0 - 5 RBC/hpf   WBC, UA 0-5 0 - 5 WBC/hpf   Bacteria, UA NONE SEEN NONE SEEN   Squamous Epithelial / LPF 0-5 0 - 5  Hyaline Casts, UA PRESENT   Troponin I  Result  Value Ref Range   Troponin I <0.03 <0.03 ng/mL   Dg Chest 2 View Result Date: 03/10/2018 CLINICAL DATA:  Epigastric pain. EXAM: CHEST - 2 VIEW COMPARISON:  Chest x-ray dated March 06, 2015. FINDINGS: The heart size and mediastinal contours are within normal limits. Normal pulmonary vascularity. No focal consolidation, pleural effusion, or pneumothorax. No acute osseous abnormality. IMPRESSION: No active cardiopulmonary disease. Electronically Signed   By: Titus Dubin M.D.   On: 03/10/2018 13:29   Ct Abdomen Pelvis W Contrast Result Date: 03/10/2018 CLINICAL DATA:  Upper abdominal pain over the last week. Nausea and diarrhea. Generalized weakness. History of gastrointestinal bleeding. EXAM: CT ABDOMEN AND PELVIS WITH CONTRAST TECHNIQUE: Multidetector CT imaging of the abdomen and pelvis was performed using the standard protocol following bolus administration of intravenous contrast. CONTRAST:  34mL OMNIPAQUE IOHEXOL 300 MG/ML  SOLN COMPARISON:  Ultrasound 05/31/2014 FINDINGS: Lower chest: Normal Hepatobiliary: Subcentimeter liver cyst in the left lobe. No other liver parenchymal finding. Previous cholecystectomy. No ductal dilatation. Pancreas: Normal Spleen: Normal Adrenals/Urinary Tract: Adrenal glands are normal. Kidneys are normal. Bladder is normal. Stomach/Bowel: No sign of bowel obstruction. No evidence of inflammatory disease. There is diverticulosis of the sigmoid colon but no evidence of diverticulitis. Vascular/Lymphatic: Aortic atherosclerosis. No aneurysm. IVC is normal. No retroperitoneal adenopathy. Reproductive: Previous hysterectomy.  No pelvic mass. Other: No free fluid or air. Musculoskeletal: No significant finding. Benign appearing hemangioma within the L3 vertebral body. IMPRESSION: No acute or significant finding. No cause of the presenting symptoms is identified. Previous cholecystectomy. Electronically Signed   By: Nelson Chimes M.D.   On: 03/10/2018 13:06     1515:  BUN/Cr per  baseline. Pt not orthostatic on VS. Judicious IVF given.  Pt has tol PO well while in the ED without N/V.  No stooling while in the ED.  Abd benign, VSS. Feels better and wants to go home now. Tx symptomatically, f/u GI MD. Dx and testing d/w pt and family.  Questions answered.  Verb understanding, agreeable to d/c home with outpt f/u.    Final Clinical Impressions(s) / ED Diagnoses   Final diagnoses:  None    ED Discharge Orders    None       Francine Graven, DO 03/14/18 1724

## 2018-03-10 NOTE — Telephone Encounter (Signed)
Patient c/o of diarrhea off and on for 3 weeks. Stool color is black. Denies nausea, fever or throwing up. Everything is running through her. Pt c/o of pain under right breast and right side. Pt has had gallbladder removed in 2012. She feels like she gets weak and my pass out. Wants a call ASAP. w

## 2018-03-10 NOTE — ED Triage Notes (Addendum)
EMS reports pt c/o upper abd pain x 1 week.  Reports history of cholecystectomy.  Reports nausea and diarrhea, no vomiting.  Pt c/o generalized weakness.   Pt says her stools have been dark lately.  Reports history of GI bleeding.  Pt has been taking pepto bismol but says she noticed her stools were dark prior to taking the pepto.

## 2018-03-10 NOTE — Telephone Encounter (Signed)
Patient seen Susan Contreras 7/9 and states she is worse since seeing her. C/o diarrhea that has been on and off x 3 weeks- black stool.  Patient states that she feels she is getting dehydrated since she has been getting weak.  Denies fever, nausea or vomiting.  Advised patient that she needs to be seen or go to the ER. Patient states she is going to have someone take her to [facility] since she feels she is getting dehydrated.  States she was going to call someone to take her when we get off the phone or call EMS.  Verbalizes understanding that she needs to go to ER.

## 2018-03-10 NOTE — ED Notes (Signed)
Pt tolerating PO intake

## 2018-03-10 NOTE — ED Notes (Signed)
Pt stated that she is ready to go and requested IV to be taken out. She reports that she has been waiting too long for discharge. RN apologized and explained that her discharge paperwork was in process/

## 2018-03-10 NOTE — Discharge Instructions (Signed)
Eat a bland diet, avoiding greasy, fatty, fried foods, as well as spicy and acidic foods or beverages.  Avoid eating within 2 to 3 hours before going to bed or laying down.  Also avoid teas, colas, coffee, chocolate, pepermint and spearment.  Take the prescriptions as directed.  Call your regular medical doctor today to schedule a follow up appointment this week.  Call the GI doctor today to schedule a follow up appointment within the next week. Return to the Emergency Department immediately if worsening.

## 2018-03-10 NOTE — ED Notes (Signed)
Patient transported to CT 

## 2018-03-12 LAB — URINE CULTURE: CULTURE: NO GROWTH

## 2018-04-19 ENCOUNTER — Other Ambulatory Visit: Payer: Self-pay | Admitting: Family

## 2018-04-19 DIAGNOSIS — F411 Generalized anxiety disorder: Secondary | ICD-10-CM

## 2018-04-19 NOTE — Telephone Encounter (Signed)
Last seen 03/02/18 

## 2018-05-12 DIAGNOSIS — Z1231 Encounter for screening mammogram for malignant neoplasm of breast: Secondary | ICD-10-CM | POA: Diagnosis not present

## 2018-05-12 LAB — HM MAMMOGRAPHY

## 2018-05-19 ENCOUNTER — Encounter: Payer: Self-pay | Admitting: Family

## 2018-05-19 ENCOUNTER — Ambulatory Visit (INDEPENDENT_AMBULATORY_CARE_PROVIDER_SITE_OTHER): Payer: Medicare HMO | Admitting: Family

## 2018-05-19 VITALS — BP 105/65 | HR 85 | Temp 98.0°F | Ht 61.0 in | Wt 174.8 lb

## 2018-05-19 DIAGNOSIS — E8881 Metabolic syndrome: Secondary | ICD-10-CM

## 2018-05-19 DIAGNOSIS — Z79899 Other long term (current) drug therapy: Secondary | ICD-10-CM

## 2018-05-19 DIAGNOSIS — E039 Hypothyroidism, unspecified: Secondary | ICD-10-CM | POA: Diagnosis not present

## 2018-05-19 DIAGNOSIS — F411 Generalized anxiety disorder: Secondary | ICD-10-CM | POA: Diagnosis not present

## 2018-05-19 DIAGNOSIS — E669 Obesity, unspecified: Secondary | ICD-10-CM

## 2018-05-19 DIAGNOSIS — F1129 Opioid dependence with unspecified opioid-induced disorder: Secondary | ICD-10-CM | POA: Diagnosis not present

## 2018-05-19 DIAGNOSIS — I10 Essential (primary) hypertension: Secondary | ICD-10-CM

## 2018-05-19 DIAGNOSIS — K2100 Gastro-esophageal reflux disease with esophagitis, without bleeding: Secondary | ICD-10-CM

## 2018-05-19 DIAGNOSIS — E785 Hyperlipidemia, unspecified: Secondary | ICD-10-CM | POA: Diagnosis not present

## 2018-05-19 DIAGNOSIS — E66811 Obesity, class 1: Secondary | ICD-10-CM

## 2018-05-19 DIAGNOSIS — E559 Vitamin D deficiency, unspecified: Secondary | ICD-10-CM | POA: Diagnosis not present

## 2018-05-19 DIAGNOSIS — F1320 Sedative, hypnotic or anxiolytic dependence, uncomplicated: Secondary | ICD-10-CM | POA: Insufficient documentation

## 2018-05-19 MED ORDER — DEXLANSOPRAZOLE 60 MG PO CPDR: 60.0000 mg | DELAYED_RELEASE_CAPSULE | Freq: Every day | ORAL | 5 refills | Status: DC

## 2018-05-19 MED ORDER — ALPRAZOLAM 0.25 MG PO TABS: 0.2500 mg | ORAL_TABLET | Freq: Every evening | ORAL | 5 refills | Status: DC | PRN

## 2018-05-19 NOTE — Progress Notes (Signed)
Subjective:    Patient ID: Audris Speaker, female    DOB: 1940/10/02, 77 y.o.   MRN: [id number]  Chief Complaint  Patient presents with  . Gastroesophageal Reflux    belching and gas    Gastroesophageal Reflux  She complains of belching, coughing, heartburn, a hoarse voice and nausea. This is a chronic problem. The current episode started more than 1 year ago. The problem occurs frequently. The problem has been waxing and waning. The symptoms are aggravated by lying down. Associated symptoms include fatigue. Risk factors include obesity. She has tried a PPI for the symptoms. The treatment provided moderate relief.  Anxiety  Presents for follow-up visit. Symptoms include dizziness, excessive worry, insomnia, irritability, nausea and nervous/anxious behavior. Patient reports no shortness of breath. Symptoms occur occasionally. The severity of symptoms is moderate. The quality of sleep is good.    Hypertension  This is a chronic problem. The current episode started more than 1 year ago. The problem has been resolved since onset. The problem is controlled. Associated symptoms include anxiety and malaise/fatigue. Pertinent negatives include no peripheral edema or shortness of breath. Risk factors for coronary artery disease include dyslipidemia, obesity and sedentary lifestyle. The current treatment provides moderate improvement. Identifiable causes of hypertension include a thyroid problem.  Thyroid Problem  Presents for follow-up visit. Symptoms include anxiety, fatigue and hoarse voice. The symptoms have been stable. Her past medical history is significant for hyperlipidemia.  Hyperlipidemia  This is a chronic problem. The current episode started more than 1 year ago. The problem is controlled. Recent lipid tests were reviewed and are normal. Exacerbating diseases include obesity. Pertinent negatives include no shortness of breath. Current antihyperlipidemic treatment includes statins. The current  treatment provides moderate improvement of lipids. Risk factors for coronary artery disease include dyslipidemia, female sex, hypertension and a sedentary lifestyle.  Metabolic Syndrome  PT states she is not as active as she should be. She is taking her Lipitor.     Review of Systems  Constitutional: Positive for fatigue, irritability and malaise/fatigue.  HENT: Positive for hoarse voice.   Respiratory: Positive for cough. Negative for shortness of breath.   Gastrointestinal: Positive for heartburn and nausea.  Neurological: Positive for dizziness.  Psychiatric/Behavioral: The patient is nervous/anxious and has insomnia.   All other systems reviewed and are negative.      Objective:   Physical Exam  Constitutional: She is oriented to person, place, and time. She appears well-developed and well-nourished. No distress.  HENT:  Head: Normocephalic and atraumatic.  Right Ear: External ear normal.  Mouth/Throat: Oropharynx is clear and moist.  Eyes: Pupils are equal, round, and reactive to light.  Neck: Normal range of motion. Neck supple. No thyromegaly present.  Cardiovascular: Normal rate, regular rhythm, normal heart sounds and intact distal pulses.  No murmur heard. Pulmonary/Chest: Effort normal and breath sounds normal. No respiratory distress. She has no wheezes.  Abdominal: Soft. Bowel sounds are normal. She exhibits no distension. There is no tenderness.  Musculoskeletal: Normal range of motion. She exhibits no edema or tenderness.  Neurological: She is alert and oriented to person, place, and time. She has normal reflexes. No cranial nerve deficit.  Skin: Skin is warm and dry.  Psychiatric: Her behavior is normal. Judgment and thought content normal. Her mood appears anxious.  Vitals reviewed.     BP 105/65   Pulse 85   Temp 98 F (36.7 C) (Oral)   Ht _0  (1.549 m)   Wt 174 lb  12.8 oz (79.3 kg)   BMI 33.03 kg/m      Assessment & Plan:  Therisa Mennella comes in  today with chief complaint of Gastroesophageal Reflux (belching and gas)   Diagnosis and orders addressed:  1. Essential hypertension - CMP14+EGFR - CBC with Differential/Platelet  2. GAD (generalized anxiety disorder) - CMP14+EGFR - CBC with Differential/Platelet - ToxASSURE Select 13 (MW), Urine - ALPRAZolam (XANAX) 0.25 MG tablet; Take 1 tablet (0.25 mg total) by mouth at bedtime as needed for anxiety.  Dispense: 30 tablet; Refill: 5  3. Gastroesophageal reflux disease with esophagitis -Will stop Protonix today and start Dexilant -Diet discussed- Avoid fried, spicy, citrus foods, caffeine and alcohol -Do not eat 2-3 hours before bedtime -Encouraged small frequent meals -Avoid NSAID's - CMP14+EGFR - CBC with Differential/Platelet - dexlansoprazole (DEXILANT) 60 MG capsule; Take 1 capsule (60 mg total) by mouth daily.  Dispense: 30 capsule; Refill: 5  4. Hyperlipidemia, unspecified hyperlipidemia type - CMP14+EGFR - CBC with Differential/Platelet  5. Hypothyroidism, unspecified type - CMP14+EGFR - CBC with Differential/Platelet  6. Obesity (BMI 30.0-34.9) - CMP14+EGFR - CBC with Differential/Platelet  7. Vitamin D deficiency - CMP14+EGFR - CBC with Differential/Platelet  8. Metabolic syndrome - TXL21+VGJF - CBC with Differential/Platelet  9. Opioid dependence with opioid-induced disorder (HCC) - ToxASSURE Select 13 (MW), Urine - ALPRAZolam (XANAX) 0.25 MG tablet; Take 1 tablet (0.25 mg total) by mouth at bedtime as needed for anxiety.  Dispense: 30 tablet; Refill: 5  10. Controlled substance agreement signed - ToxASSURE Select 13 (MW), Urine - ALPRAZolam (XANAX) 0.25 MG tablet; Take 1 tablet (0.25 mg total) by mouth at bedtime as needed for anxiety.  Dispense: 30 tablet; Refill: 5   Labs pending Health Maintenance reviewed Diet and exercise encouraged  Follow up plan: 6 months    [name], FNP

## 2018-05-19 NOTE — Patient Instructions (Signed)

## 2018-05-20 LAB — CBC WITH DIFFERENTIAL/PLATELET
BASOS ABS: 0.1 10*3/uL (ref 0.0–0.2)
Basos: 1 %
EOS (ABSOLUTE): 0.3 10*3/uL (ref 0.0–0.4)
Eos: 3 %
HEMATOCRIT: 40.1 % (ref 34.0–46.6)
Hemoglobin: 13.2 g/dL (ref 11.1–15.9)
IMMATURE GRANULOCYTES: 0 %
Immature Grans (Abs): 0 10*3/uL (ref 0.0–0.1)
Lymphocytes Absolute: 3 10*3/uL (ref 0.7–3.1)
Lymphs: 37 %
MCH: 28.7 pg (ref 26.6–33.0)
MCHC: 32.9 g/dL (ref 31.5–35.7)
MCV: 87 fL (ref 79–97)
MONOS ABS: 0.6 10*3/uL (ref 0.1–0.9)
Monocytes: 7 %
NEUTROS PCT: 52 %
Neutrophils Absolute: 4 10*3/uL (ref 1.4–7.0)
PLATELETS: 327 10*3/uL (ref 150–450)
RBC: 4.6 x10E6/uL (ref 3.77–5.28)
RDW: 13.2 % (ref 12.3–15.4)
WBC: 7.9 10*3/uL (ref 3.4–10.8)

## 2018-05-20 LAB — CMP14+EGFR
ALBUMIN: 4.3 g/dL (ref 3.5–4.8)
ALT: 12 IU/L (ref 0–32)
AST: 18 IU/L (ref 0–40)
Albumin/Globulin Ratio: 1.3 (ref 1.2–2.2)
Alkaline Phosphatase: 82 IU/L (ref 39–117)
BUN/Creatinine Ratio: 10 — ABNORMAL LOW (ref 12–28)
BUN: 13 mg/dL (ref 8–27)
Bilirubin Total: 0.7 mg/dL (ref 0.0–1.2)
CALCIUM: 9.5 mg/dL (ref 8.7–10.3)
CO2: 22 mmol/L (ref 20–29)
Chloride: 100 mmol/L (ref 96–106)
Creatinine, Ser: 1.35 mg/dL — ABNORMAL HIGH (ref 0.57–1.00)
GFR calc Af Amer: 44 mL/min/{1.73_m2} — ABNORMAL LOW (ref >59)
GFR, EST NON AFRICAN AMERICAN: 38 mL/min/{1.73_m2} — AB (ref >59)
GLOBULIN, TOTAL: 3.4 g/dL (ref 1.5–4.5)
Glucose: 108 mg/dL — ABNORMAL HIGH (ref 65–99)
Potassium: 5 mmol/L (ref 3.5–5.2)
SODIUM: 139 mmol/L (ref 134–144)
TOTAL PROTEIN: 7.7 g/dL (ref 6.0–8.5)

## 2018-05-22 LAB — TOXASSURE SELECT 13 (MW), URINE

## 2018-05-26 ENCOUNTER — Telehealth: Payer: Self-pay | Admitting: Family

## 2018-05-26 NOTE — Telephone Encounter (Signed)
Patient has a follow up appointment scheduled. 

## 2018-05-27 ENCOUNTER — Encounter: Payer: Self-pay | Admitting: Family

## 2018-05-27 ENCOUNTER — Ambulatory Visit (INDEPENDENT_AMBULATORY_CARE_PROVIDER_SITE_OTHER): Payer: Medicare HMO | Admitting: Family

## 2018-05-27 VITALS — BP 111/72 | HR 82 | Temp 97.5°F | Ht 61.0 in | Wt 172.4 lb

## 2018-05-27 DIAGNOSIS — R197 Diarrhea, unspecified: Secondary | ICD-10-CM | POA: Diagnosis not present

## 2018-05-27 DIAGNOSIS — J301 Allergic rhinitis due to pollen: Secondary | ICD-10-CM | POA: Diagnosis not present

## 2018-05-27 DIAGNOSIS — R109 Unspecified abdominal pain: Secondary | ICD-10-CM | POA: Diagnosis not present

## 2018-05-27 MED ORDER — DICYCLOMINE HCL 20 MG PO TABS: 20.0000 mg | ORAL_TABLET | Freq: Three times a day (TID) | ORAL | 1 refills | Status: DC

## 2018-05-27 MED ORDER — FLUTICASONE PROPIONATE 50 MCG/ACT NA SUSP: 1.0000 | Freq: Every day | NASAL | 5 refills | Status: DC

## 2018-05-27 NOTE — Patient Instructions (Signed)
Diarrhea, Adult °Diarrhea is when you have loose and water poop (stool) often. Diarrhea can make you feel weak and cause you to get dehydrated. Dehydration can make you tired and thirsty, make you have a dry mouth, and make it so you pee (urinate) less often. Diarrhea often lasts 2-3 days. However, it can last longer if it is a sign of something more serious. It is important to treat your diarrhea as told by your doctor. °Follow these instructions at home: °Eating and drinking ° °Follow these recommendations as told by your doctor: °· Take an oral rehydration solution (ORS). This is a drink that is sold at pharmacies and stores. °· Drink clear fluids, such as: °? Water. °? Ice chips. °? Diluted fruit juice. °? Low-calorie sports drinks. °· Eat bland, easy-to-digest foods in small amounts as you are able. These foods include: °? Bananas. °? Applesauce. °? Rice. °? Low-fat (lean) meats. °? Toast. °? Crackers. °· Avoid drinking fluids that have a lot of sugar or caffeine in them. °· Avoid alcohol. °· Avoid spicy or fatty foods. ° °General instructions ° °· Drink enough fluid to keep your pee (urine) clear or pale yellow. °· Wash your hands often. If you cannot use soap and water, use hand sanitizer. °· Make sure that all people in your home wash their hands well and often. °· Take over-the-counter and prescription medicines only as told by your doctor. °· Rest at home while you get better. °· Watch your condition for any changes. °· Take a warm bath to help with any burning or pain from having diarrhea. °· Keep all follow-up visits as told by your doctor. This is important. °Contact a doctor if: °· You have a fever. °· Your diarrhea gets worse. °· You have new symptoms. °· You cannot keep fluids down. °· You feel light-headed or dizzy. °· You have a headache. °· You have muscle cramps. °Get help right away if: °· You have chest pain. °· You feel very weak or you pass out (faint). °· You have bloody or black poop or  poop that look like tar. °· You have very bad pain, cramping, or bloating in your belly (abdomen). °· You have trouble breathing or you are breathing very quickly. °· Your heart is beating very quickly. °· Your skin feels cold and clammy. °· You feel confused. °· You have signs of dehydration, such as: °? Dark pee, hardly any pee, or no pee. °? Cracked lips. °? Dry mouth. °? Sunken eyes. °? Sleepiness. °? Weakness. °This information is not intended to replace advice given to you by your health care provider. Make sure you discuss any questions you have with your health care provider. °Document Released: 01/28/2008 Document Revised: 02/29/2016 Document Reviewed: 04/17/2015 °Elsevier Interactive Patient Education © 2018 [employer] ° °

## 2018-05-27 NOTE — Progress Notes (Signed)
   Subjective:    Patient ID: Susan Contreras, female    DOB: 1941-04-24, 77 y.o.   MRN: [id number]  Chief Complaint  Patient presents with  . Irritable Bowel Syndrome   PT presents to the office today with diarrhea after starting amoxicillin after having teeth removed. Diarrhea   This is a new problem. The current episode started in the past 7 days. The problem occurs 5 to 10 times per day. The problem has been waxing and waning. Pertinent negatives include no bloating, coughing, increased  flatus or vomiting. Risk factors include recent antibiotic use. She has tried nothing for the symptoms. The treatment provided no relief.  Sinus Problem  This is a recurrent problem. The problem has been waxing and waning since onset. Associated symptoms include ear pain (right ear), a hoarse voice, sinus pressure and a sore throat. Pertinent negatives include no coughing or sneezing. Past treatments include oral decongestants. The treatment provided mild relief.      Review of Systems  HENT: Positive for ear pain (right ear), hoarse voice, sinus pressure and sore throat. Negative for sneezing.   Respiratory: Negative for cough.   Gastrointestinal: Positive for diarrhea. Negative for bloating, flatus and vomiting.  All other systems reviewed and are negative.      Objective:   Physical Exam  Constitutional: She is oriented to person, place, and time. She appears well-developed and well-nourished. No distress.  HENT:  Head: Normocephalic and atraumatic.  Right Ear: External ear normal.  Left Ear: External ear normal.  Nose: Mucosal edema present.  Mouth/Throat: Posterior oropharyngeal erythema present.  Eyes: Pupils are equal, round, and reactive to light.  Neck: Normal range of motion. Neck supple. No thyromegaly present.  Cardiovascular: Normal rate, regular rhythm, normal heart sounds and intact distal pulses.  No murmur heard. Pulmonary/Chest: Effort normal and breath sounds normal. No  respiratory distress. She has no wheezes.  Abdominal: Soft. Bowel sounds are normal. She exhibits no distension. There is no tenderness.  Musculoskeletal: Normal range of motion. She exhibits no edema or tenderness.  Neurological: She is alert and oriented to person, place, and time. She has normal reflexes. No cranial nerve deficit.  Skin: Skin is warm and dry.  Psychiatric: She has a normal mood and affect. Her behavior is normal. Judgment and thought content normal.  Vitals reviewed.     BP 111/72   Pulse 82   Temp (!) 97.5 F (36.4 C) (Oral)   Ht 5\' 1"  (1.549 m)   Wt 172 lb 6.4 oz (78.2 kg)   BMI 32.57 kg/m      Assessment & Plan:  Susan Contreras comes in today with chief complaint of Irritable Bowel Syndrome   Diagnosis and orders addressed:  1. Diarrhea, unspecified type Bland diet Hold off antibiotics Probiotics - dicyclomine (BENTYL) 20 MG tablet; Take 1 tablet (20 mg total) by mouth 4 (four) times daily -  before meals and at bedtime.  Dispense: 90 tablet; Refill: 1  2. Abdominal pain, unspecified abdominal location - dicyclomine (BENTYL) 20 MG tablet; Take 1 tablet (20 mg total) by mouth 4 (four) times daily -  before meals and at bedtime.  Dispense: 90 tablet; Refill: 1  3. Allergic rhinitis due to pollen, unspecified seasonality Start Flonase and zyrtec  - fluticasone (FLONASE) 50 MCG/ACT nasal spray; Place 1 spray into both nostrils daily.  Dispense: 16 g; Refill: 5    Follow up plan: As needed    [name], FNP

## 2018-06-02 ENCOUNTER — Other Ambulatory Visit: Payer: Self-pay | Admitting: Family

## 2018-06-02 DIAGNOSIS — I10 Essential (primary) hypertension: Secondary | ICD-10-CM

## 2018-08-16 ENCOUNTER — Encounter: Payer: Self-pay | Admitting: Family

## 2018-08-16 ENCOUNTER — Ambulatory Visit (INDEPENDENT_AMBULATORY_CARE_PROVIDER_SITE_OTHER): Payer: Medicare HMO | Admitting: Family

## 2018-08-16 VITALS — BP 121/65 | HR 75 | Temp 96.7°F | Ht 61.0 in | Wt 175.0 lb

## 2018-08-16 DIAGNOSIS — J019 Acute sinusitis, unspecified: Secondary | ICD-10-CM

## 2018-08-16 MED ORDER — AMOXICILLIN-POT CLAVULANATE 875-125 MG PO TABS: 1.0000 | ORAL_TABLET | Freq: Two times a day (BID) | ORAL | 0 refills | Status: DC

## 2018-08-16 MED ORDER — AMOXICILLIN 500 MG PO CAPS: 500.0000 mg | ORAL_CAPSULE | Freq: Two times a day (BID) | ORAL | 0 refills | Status: DC

## 2018-08-16 NOTE — Patient Instructions (Signed)
Sinusitis, Adult  Sinusitis is inflammation of your sinuses. Sinuses are hollow spaces in the bones around your face. Your sinuses are located:   Around your eyes.   In the middle of your forehead.   Behind your nose.   In your cheekbones.  Mucus normally drains out of your sinuses. When your nasal tissues become inflamed or swollen, mucus can become trapped or blocked. This allows bacteria, viruses, and fungi to grow, which leads to infection. Most infections of the sinuses are caused by a virus.  Sinusitis can develop quickly. It can last for up to 4 weeks (acute) or for more than 12 weeks (chronic). Sinusitis often develops after a cold.  What are the causes?  This condition is caused by anything that creates swelling in the sinuses or stops mucus from draining. This includes:   Allergies.   Asthma.   Infection from bacteria or viruses.   Deformities or blockages in your nose or sinuses.   Abnormal growths in the nose (nasal polyps).   Pollutants, such as chemicals or irritants in the air.   Infection from fungi (rare).  What increases the risk?  You are more likely to develop this condition if you:   Have a weak body defense system (immune system).   Do a lot of swimming or diving.   Overuse nasal sprays.   Smoke.  What are the signs or symptoms?  The main symptoms of this condition are pain and a feeling of pressure around the affected sinuses. Other symptoms include:   Stuffy nose or congestion.   Thick drainage from your nose.   Swelling and warmth over the affected sinuses.   Headache.   Upper toothache.   A cough that may get worse at night.   Extra mucus that collects in the throat or the back of the nose (postnasal drip).   Decreased sense of smell and taste.   Fatigue.   A fever.   Sore throat.   Bad breath.  How is this diagnosed?  This condition is diagnosed based on:   Your symptoms.   Your medical history.   A physical exam.   Tests to find out if your condition is  acute or chronic. This may include:  ? Checking your nose for nasal polyps.  ? Viewing your sinuses using a device that has a light (endoscope).  ? Testing for allergies or bacteria.  ? Imaging tests, such as an MRI or CT scan.  In rare cases, a bone biopsy may be done to rule out more serious types of fungal sinus disease.  How is this treated?  Treatment for sinusitis depends on the cause and whether your condition is chronic or acute.   If caused by a virus, your symptoms should go away on their own within 10 days. You may be given medicines to relieve symptoms. They include:  ? Medicines that shrink swollen nasal passages (topical intranasal decongestants).  ? Medicines that treat allergies (antihistamines).  ? A spray that eases inflammation of the nostrils (topical intranasal corticosteroids).  ? Rinses that help get rid of thick mucus in your nose (nasal saline washes).   If caused by bacteria, your health care provider may recommend waiting to see if your symptoms improve. Most bacterial infections will get better without antibiotic medicine. You may be given antibiotics if you have:  ? A severe infection.  ? A weak immune system.   If caused by narrow nasal passages or nasal polyps, you may need   to have surgery.  Follow these instructions at home:  Medicines   Take, use, or apply over-the-counter and prescription medicines only as told by your health care provider. These may include nasal sprays.   If you were prescribed an antibiotic medicine, take it as told by your health care provider. Do not stop taking the antibiotic even if you start to feel better.  Hydrate and humidify     Drink enough fluid to keep your urine pale yellow. Staying hydrated will help to thin your mucus.   Use a cool mist humidifier to keep the humidity level in your home above 50%.   Inhale steam for 10-15 minutes, 3-4 times a day, or as told by your health care provider. You can do this in the bathroom while a hot shower is  running.   Limit your exposure to cool or dry air.  Rest   Rest as much as possible.   Sleep with your head raised (elevated).   Make sure you get enough sleep each night.  General instructions     Apply a warm, moist washcloth to your face 3-4 times a day or as told by your health care provider. This will help with discomfort.   Wash your hands often with soap and water to reduce your exposure to germs. If soap and water are not available, use hand sanitizer.   Do not smoke. Avoid being around people who are smoking (secondhand smoke).   Keep all follow-up visits as told by your health care provider. This is important.  Contact a health care provider if:   You have a fever.   Your symptoms get worse.   Your symptoms do not improve within 10 days.  Get help right away if:   You have a severe headache.   You have persistent vomiting.   You have severe pain or swelling around your face or eyes.   You have vision problems.   You develop confusion.   Your neck is stiff.   You have trouble breathing.  Summary   Sinusitis is soreness and inflammation of your sinuses. Sinuses are hollow spaces in the bones around your face.   This condition is caused by nasal tissues that become inflamed or swollen. The swelling traps or blocks the flow of mucus. This allows bacteria, viruses, and fungi to grow, which leads to infection.   If you were prescribed an antibiotic medicine, take it as told by your health care provider. Do not stop taking the antibiotic even if you start to feel better.   Keep all follow-up visits as told by your health care provider. This is important.  This information is not intended to replace advice given to you by your health care provider. Make sure you discuss any questions you have with your health care provider.  Document Released: 08/11/2005 Document Revised: 01/11/2018 Document Reviewed: 01/11/2018  Elsevier Interactive Patient Education  2019 [employer]

## 2018-08-16 NOTE — Progress Notes (Signed)
Subjective:    Patient ID: Joci Dress, female    DOB: 06-Jan-1941, 77 y.o.   MRN: [id number]  Chief Complaint  Patient presents with  . Facial Pain    sinus pressure x1 week    Sinusitis  This is a new problem. The current episode started 1 to 4 weeks ago. The problem has been waxing and waning since onset. There has been no fever. Her pain is at a severity of 6/10. The pain is moderate. Associated symptoms include chills, congestion, coughing (dry), diaphoresis, ear pain, headaches, a hoarse voice, sinus pressure, sneezing and a sore throat (scratchy). Past treatments include oral decongestants. The treatment provided mild relief.      Review of Systems  Constitutional: Positive for chills and diaphoresis.  HENT: Positive for congestion, ear pain, hoarse voice, sinus pressure, sneezing and sore throat (scratchy).   Respiratory: Positive for cough (dry).   Neurological: Positive for headaches.  All other systems reviewed and are negative.      Objective:   Physical Exam Vitals signs reviewed.  Constitutional:      General: She is not in acute distress.    Appearance: She is well-developed.  HENT:     Head: Normocephalic and atraumatic.     Right Ear: Tympanic membrane normal.     Left Ear: Tympanic membrane normal.     Nose:     Right Turbinates: Enlarged.     Left Turbinates: Enlarged.     Right Sinus: Frontal sinus tenderness present.     Left Sinus: Frontal sinus tenderness present.  Eyes:     Pupils: Pupils are equal, round, and reactive to light.  Neck:     Musculoskeletal: Normal range of motion and neck supple.     Thyroid: No thyromegaly.  Cardiovascular:     Rate and Rhythm: Normal rate and regular rhythm.     Heart sounds: Normal heart sounds. No murmur.  Pulmonary:     Effort: Pulmonary effort is normal. No respiratory distress.     Breath sounds: Normal breath sounds. No wheezing.  Abdominal:     General: Bowel sounds are normal. There is no  distension.     Palpations: Abdomen is soft.     Tenderness: There is no abdominal tenderness.  Musculoskeletal: Normal range of motion.        General: No tenderness.  Skin:    General: Skin is warm and dry.  Neurological:     Mental Status: She is alert and oriented to person, place, and time.     Cranial Nerves: No cranial nerve deficit.     Deep Tendon Reflexes: Reflexes are normal and symmetric.  Psychiatric:        Behavior: Behavior normal.        Thought Content: Thought content normal.        Judgment: Judgment normal.       BP 121/65   Pulse 75   Temp (!) 96.7 F (35.9 C)   Ht 5\' 1"  (1.549 m)   Wt 175 lb (79.4 kg)   BMI 33.07 kg/m      Assessment & Plan:  Susan Contreras comes in today with chief complaint of Facial Pain (sinus pressure x1 week)   Diagnosis and orders addressed:  1. Acute non-recurrent sinusitis, unspecified location - Take meds as prescribed - Use a cool mist humidifier  -Use saline nose sprays frequently -Force fluids -For any cough or congestion  Use plain Mucinex- regular strength or max strength is  fine -For fever or aces or pains- take tylenol or ibuprofen. -Throat lozenges if help RTO if symptoms worsen or do not improve  - amoxicillin (AMOXIL) 500 MG capsule; Take 1 capsule (500 mg total) by mouth 2 (two) times daily.  Dispense: 20 capsule; Refill: 0   [name], FNP

## 2018-08-23 ENCOUNTER — Ambulatory Visit: Payer: Self-pay | Admitting: Physician Assistant

## 2018-09-21 ENCOUNTER — Other Ambulatory Visit: Payer: Self-pay | Admitting: Family

## 2018-09-22 ENCOUNTER — Ambulatory Visit (INDEPENDENT_AMBULATORY_CARE_PROVIDER_SITE_OTHER): Payer: Medicare HMO | Admitting: Family Medicine

## 2018-09-22 ENCOUNTER — Encounter: Payer: Self-pay | Admitting: Family Medicine

## 2018-09-22 VITALS — BP 114/68 | HR 84 | Temp 97.2°F | Ht 61.0 in | Wt 177.4 lb

## 2018-09-22 DIAGNOSIS — J0100 Acute maxillary sinusitis, unspecified: Secondary | ICD-10-CM | POA: Diagnosis not present

## 2018-09-22 MED ORDER — AMOXICILLIN 500 MG PO CAPS: 500.0000 mg | ORAL_CAPSULE | Freq: Two times a day (BID) | ORAL | 0 refills | Status: DC

## 2018-09-22 NOTE — Progress Notes (Signed)
Subjective: CC: sinusisit PCP: [name], FNP [id number] Susan Contreras is a 78 y.o. female presenting to clinic today for:  1. Sinusitis Patient reports about a 9-day history of left-sided sore throat and left-sided earache.  She reports associated dizziness, headache and frontal sinus pressure.  Denies any purulent drainage but has had subjective fevers.  In December, she was treated for acute bacterial sinusitis with amoxicillin.  She is allergic to multiple antibiotics and has impaired renal function.  She reports that use of Flonase and an oral antihistamine at home.  She also has been using Antivert for dizziness which seems to be relieving it.   ROS: Per HPI  Allergies  Allergen Reactions  . Biaxin [Clarithromycin]   . Cephalexin   . Ciprofloxacin   . Doxycycline     Headaches  . Flagyl [Metronidazole Hcl]   . Ketek [Telithromycin]   . Sulfa Antibiotics    Past Medical History:  Diagnosis Date  . Anxiety   . Depression   . Diverticulosis   . Esophageal stricture   . Family hx of colorectal cancer   . GERD (gastroesophageal reflux disease)   . Helicobacter pylori (H. pylori) 1997   EGD  . Hemorrhoids   . Hiatal hernia   . Hx of adenomatous colonic polyps   . Hyperlipemia   . Hypertension   . PUD (peptic ulcer disease) 1997   EGD  . Thyroid disease   . Vertigo     Current Outpatient Medications:  .  ALPRAZolam (XANAX) 0.25 MG tablet, Take 1 tablet (0.25 mg total) by mouth at bedtime as needed for anxiety., Disp: 30 tablet, Rfl: 5 .  dexlansoprazole (DEXILANT) 60 MG capsule, Take 1 capsule (60 mg total) by mouth daily., Disp: 30 capsule, Rfl: 5 .  dicyclomine (BENTYL) 20 MG tablet, Take 1 tablet (20 mg total) by mouth 4 (four) times daily -  before meals and at bedtime., Disp: 90 tablet, Rfl: 1 .  fluticasone (FLONASE) 50 MCG/ACT nasal spray, Place 1 spray into both nostrils daily., Disp: 16 g, Rfl: 5 .  irbesartan (AVAPRO) 75 MG tablet, TAKE 1 TABLET DAILY,  Disp: 90 tablet, Rfl: 1 .  levothyroxine (SYNTHROID, LEVOTHROID) 75 MCG tablet, TAKE (1) TABLET DAILY BE- FORE BREAKFAST., Disp: 90 tablet, Rfl: 3 .  meclizine (ANTIVERT) 25 MG tablet, TAKE ONE TABLET BY MOUTH THREE TIMES DAILY AS NEEDED FOR DIZZINESS, Disp: 30 tablet, Rfl: 4 .  amoxicillin (AMOXIL) 500 MG capsule, Take 1 capsule (500 mg total) by mouth 2 (two) times daily for 10 days., Disp: 20 capsule, Rfl: 0 .  atorvastatin (LIPITOR) 20 MG tablet, Take 1 tablet (20 mg total) by mouth daily. (Patient not taking: Reported on 08/16/2018), Disp: 90 tablet, Rfl: 3 Social History   Socioeconomic History  . Marital status: Married    Spouse name: Not on file  . Number of children: 2  . Years of education: Not on file  . Highest education level: Not on file  Occupational History  . Occupation: Retired  Scientific laboratory technician  . Financial resource strain: Not on file  . Food insecurity:    Worry: Not on file    Inability: Not on file  . Transportation needs:    Medical: Not on file    Non-medical: Not on file  Tobacco Use  . Smoking status: Never Smoker  . Smokeless tobacco: Never Used  Substance and Sexual Activity  . Alcohol use: No  . Drug use: No  . Sexual activity: Not  on file  Lifestyle  . Physical activity:    Days per week: Not on file    Minutes per session: Not on file  . Stress: Not on file  Relationships  . Social connections:    Talks on phone: Not on file    Gets together: Not on file    Attends religious service: Not on file    Active member of club or organization: Not on file    Attends meetings of clubs or organizations: Not on file    Relationship status: Not on file  . Intimate partner violence:    Fear of current or ex partner: Not on file    Emotionally abused: Not on file    Physically abused: Not on file    Forced sexual activity: Not on file  Other Topics Concern  . Not on file  Social History Narrative   2 caffeine drinks daily    Family History    Problem Relation Age of Onset  . Colon cancer Sister   . Stroke Mother   . Heart disease Mother   . Parkinson's disease Father   . Ovarian cancer Sister   . Esophageal cancer Neg Hx   . Stomach cancer Neg Hx   . Kidney disease Neg Hx   . Liver disease Neg Hx   . Diabetes Neg Hx     Objective: Office vital signs reviewed. BP 114/68   Pulse 84   Temp (!) 97.2 F (36.2 C) (Oral)   Ht 5\' 1"  (1.549 m)   Wt 177 lb 6.4 oz (80.5 kg)   BMI 33.52 kg/m   Physical Examination:  General: Awake, alert, well nourished, No acute distress HEENT: +TTP to frontal sinuses    Neck: No masses palpated. No lymphadenopathy    Ears: Tympanic membranes intact, normal light reflex, no erythema, no bulging    Eyes: PERRLA, extraocular membranes intact, sclera white    Nose: nasal turbinates moist, clear nasal discharge    Throat: moist mucus membranes, no erythema, no tonsillar exudate.  Airway is patent Cardio: regular rate and rhythm, S1S2 heard, no murmurs appreciated Pulm: clear to auscultation bilaterally, no wheezes, rhonchi or rales; normal work of breathing on room air  Assessment/ Plan: 78 y.o. female   1. Acute non-recurrent maxillary sinusitis Patient is afebrile nontoxic-appearing.  Physical exam was notable for tenderness to palpation along the frontal sinuses.  Given worsening severity of symptoms will empirically treat with amoxicillin 500 mg p.o. twice daily for the next 10 days.  Home care instructions reviewed.  Handout provided.  Reasons for return discussed.  Follow-up PRN.   No orders of the defined types were placed in this encounter.  Meds ordered this encounter  Medications  . amoxicillin (AMOXIL) 500 MG capsule    Sig: Take 1 capsule (500 mg total) by mouth 2 (two) times daily for 10 days.    Dispense:  20 capsule    Refill:  Reese, DO [place] [phone]

## 2018-09-22 NOTE — Patient Instructions (Signed)

## 2018-11-08 ENCOUNTER — Other Ambulatory Visit: Payer: Self-pay

## 2018-11-08 ENCOUNTER — Ambulatory Visit (INDEPENDENT_AMBULATORY_CARE_PROVIDER_SITE_OTHER): Payer: Medicare HMO | Admitting: Family Medicine

## 2018-11-08 ENCOUNTER — Encounter: Payer: Self-pay | Admitting: Family Medicine

## 2018-11-08 VITALS — BP 135/68 | HR 74 | Temp 98.0°F | Ht 61.0 in | Wt 180.0 lb

## 2018-11-08 DIAGNOSIS — J0111 Acute recurrent frontal sinusitis: Secondary | ICD-10-CM | POA: Diagnosis not present

## 2018-11-08 MED ORDER — PREDNISONE 20 MG PO TABS: 20.0000 mg | ORAL_TABLET | Freq: Every day | ORAL | 0 refills | Status: AC

## 2018-11-08 NOTE — Progress Notes (Signed)
Subjective: CC:Sinusitis PCP: [name], FNP MEQ:[id number] Susan Contreras is a 78 y.o. female presenting to clinic today for:  1. Sinusitis Patient with history of recurrent sinusitis.  Her last treatment was in January with amoxicillin.  She has been treated 3 times in the last year for acute bacterial sinusitis.  Medical history significant for impaired renal function and allergies to multiple antibiotics.  She presents today and notes that she thinks she has another sinus infection.  She describes frontal headache, sinus congestion, sneezing and watery eyes.  She notes intermittent and nonsustained use of Flonase.  She has Zyrtec at home but does not use it.  She reports associated left ear fullness.  Denies any purulent drainage from the nares.  No fevers.   ROS: Per HPI  Allergies  Allergen Reactions  . Biaxin [Clarithromycin]   . Cephalexin   . Ciprofloxacin   . Doxycycline     Headaches  . Flagyl [Metronidazole Hcl]   . Ketek [Telithromycin]   . Sulfa Antibiotics    Past Medical History:  Diagnosis Date  . Anxiety   . Depression   . Diverticulosis   . Esophageal stricture   . Family hx of colorectal cancer   . GERD (gastroesophageal reflux disease)   . Helicobacter pylori (H. pylori) 1997   EGD  . Hemorrhoids   . Hiatal hernia   . Hx of adenomatous colonic polyps   . Hyperlipemia   . Hypertension   . PUD (peptic ulcer disease) 1997   EGD  . Thyroid disease   . Vertigo     Current Outpatient Medications:  .  ALPRAZolam (XANAX) 0.25 MG tablet, Take 1 tablet (0.25 mg total) by mouth at bedtime as needed for anxiety., Disp: 30 tablet, Rfl: 5 .  atorvastatin (LIPITOR) 20 MG tablet, Take 1 tablet (20 mg total) by mouth daily. (Patient not taking: Reported on 08/16/2018), Disp: 90 tablet, Rfl: 3 .  dexlansoprazole (DEXILANT) 60 MG capsule, Take 1 capsule (60 mg total) by mouth daily., Disp: 30 capsule, Rfl: 5 .  dicyclomine (BENTYL) 20 MG tablet, Take 1 tablet (20 mg  total) by mouth 4 (four) times daily -  before meals and at bedtime., Disp: 90 tablet, Rfl: 1 .  fluticasone (FLONASE) 50 MCG/ACT nasal spray, Place 1 spray into both nostrils daily., Disp: 16 g, Rfl: 5 .  irbesartan (AVAPRO) 75 MG tablet, TAKE 1 TABLET DAILY, Disp: 90 tablet, Rfl: 1 .  levothyroxine (SYNTHROID, LEVOTHROID) 75 MCG tablet, TAKE (1) TABLET DAILY BE- FORE BREAKFAST., Disp: 90 tablet, Rfl: 3 .  meclizine (ANTIVERT) 25 MG tablet, TAKE ONE TABLET BY MOUTH THREE TIMES DAILY AS NEEDED FOR DIZZINESS, Disp: 30 tablet, Rfl: 4 Social History   Socioeconomic History  . Marital status: Married    Spouse name: Not on file  . Number of children: 2  . Years of education: Not on file  . Highest education level: Not on file  Occupational History  . Occupation: Retired  Scientific laboratory technician  . Financial resource strain: Not on file  . Food insecurity:    Worry: Not on file    Inability: Not on file  . Transportation needs:    Medical: Not on file    Non-medical: Not on file  Tobacco Use  . Smoking status: Never Smoker  . Smokeless tobacco: Never Used  Substance and Sexual Activity  . Alcohol use: No  . Drug use: No  . Sexual activity: Not on file  Lifestyle  . Physical  activity:    Days per week: Not on file    Minutes per session: Not on file  . Stress: Not on file  Relationships  . Social connections:    Talks on phone: Not on file    Gets together: Not on file    Attends religious service: Not on file    Active member of club or organization: Not on file    Attends meetings of clubs or organizations: Not on file    Relationship status: Not on file  . Intimate partner violence:    Fear of current or ex partner: Not on file    Emotionally abused: Not on file    Physically abused: Not on file    Forced sexual activity: Not on file  Other Topics Concern  . Not on file  Social History Narrative   2 caffeine drinks daily    Family History  Problem Relation Age of Onset  .  Colon cancer Sister   . Stroke Mother   . Heart disease Mother   . Parkinson's disease Father   . Ovarian cancer Sister   . Esophageal cancer Neg Hx   . Stomach cancer Neg Hx   . Kidney disease Neg Hx   . Liver disease Neg Hx   . Diabetes Neg Hx     Objective: Office vital signs reviewed. BP 135/68   Pulse 74   Temp 98 F (36.7 C)   Ht 5\' 1"  (1.549 m)   Wt 180 lb (81.6 kg)   BMI 34.01 kg/m   Physical Examination:  General: Awake, alert, well nourished, No acute distress HEENT: +TTP to frontal and maxillary sinuses    Neck: No masses palpated. No lymphadenopathy    Ears: Tympanic membranes intact, normal light reflex, no erythema, no bulging    Eyes: PERRLA, extraocular membranes intact, sclera white    Nose: nasal turbinates moist, clear nasal discharge    Throat: moist mucus membranes, no erythema, no tonsillar exudate.  Airway is patent   Assessment/ Plan: 78 y.o. female   1. Acute recurrent frontal sinusitis Does not appear to be a bacterial sinusitis.  I suspect more allergy mediated.  We discussed consideration for addition of Singulair versus changing her nasal spray since the Flonase tends to dry her out which prevents her from using it regularly.  She is reluctant to do this.  Instead, we will do a 3-day course of low-dose steroid, again she is reluctant to use any other medicines except for amoxicillin even there is no evidence of bacterial sinusitis at this time.  I have encouraged her to resume use of Zyrtec and use Flonase regularly.  Consider referral to ENT for recurrent sinusitis.  If she develops any signs or symptoms concerning for bacterial infection, can consider empiric antibiotics with amoxicillin.   No orders of the defined types were placed in this encounter.  Meds ordered this encounter  Medications  . predniSONE (DELTASONE) 20 MG tablet    Sig: Take 1 tablet (20 mg total) by mouth daily with breakfast for 3 days.    Dispense:  3 tablet     Refill:  [place], DO [place] [phone]

## 2018-11-08 NOTE — Patient Instructions (Signed)
Start Prednisone TOMORROW with breakfast.  Use the zyrtec and the flonase as directed.  Sinusitis, Adult Sinusitis is soreness and swelling (inflammation) of your sinuses. Sinuses are hollow spaces in the bones around your face. They are located:  Around your eyes.  In the middle of your forehead.  Behind your nose.  In your cheekbones. Your sinuses and nasal passages are lined with a fluid called mucus. Mucus drains out of your sinuses. Swelling can trap mucus in your sinuses. This lets germs (bacteria, virus, or fungus) grow, which leads to infection. Most of the time, this condition is caused by a virus. What are the causes? This condition is caused by:  Allergies.  Asthma.  Germs.  Things that block your nose or sinuses.  Growths in the nose (nasal polyps).  Chemicals or irritants in the air.  Fungus (rare). What increases the risk? You are more likely to develop this condition if:  You have a weak body defense system (immune system).  You do a lot of swimming or diving.  You use nasal sprays too much.  You smoke. What are the signs or symptoms? The main symptoms of this condition are pain and a feeling of pressure around the sinuses. Other symptoms include:  Stuffy nose (congestion).  Runny nose (drainage).  Swelling and warmth in the sinuses.  Headache.  Toothache.  A cough that may get worse at night.  Mucus that collects in the throat or the back of the nose (postnasal drip).  Being unable to smell and taste.  Being very tired (fatigue).  A fever.  Sore throat.  Bad breath. How is this diagnosed? This condition is diagnosed based on:  Your symptoms.  Your medical history.  A physical exam.  Tests to find out if your condition is short-term (acute) or long-term (chronic). Your doctor may: ? Check your nose for growths (polyps). ? Check your sinuses using a tool that has a light (endoscope). ? Check for allergies or germs. ? Do  imaging tests, such as an MRI or CT scan. How is this treated? Treatment for this condition depends on the cause and whether it is short-term or long-term.  If caused by a virus, your symptoms should go away on their own within 10 days. You may be given medicines to relieve symptoms. They include: ? Medicines that shrink swollen tissue in the nose. ? Medicines that treat allergies (antihistamines). ? A spray that treats swelling of the nostrils. ? Rinses that help get rid of thick mucus in your nose (nasal saline washes).  If caused by bacteria, your doctor may wait to see if you will get better without treatment. You may be given antibiotic medicine if you have: ? A very bad infection. ? A weak body defense system.  If caused by growths in the nose, you may need to have surgery. Follow these instructions at home: Medicines  Take, use, or apply over-the-counter and prescription medicines only as told by your doctor. These may include nasal sprays.  If you were prescribed an antibiotic medicine, take it as told by your doctor. Do not stop taking the antibiotic even if you start to feel better. Hydrate and humidify   Drink enough water to keep your pee (urine) pale yellow.  Use a cool mist humidifier to keep the humidity level in your home above 50%.  Breathe in steam for 10-15 minutes, 3-4 times a day, or as told by your doctor. You can do this in the bathroom while a  hot shower is running.  Try not to spend time in cool or dry air. Rest  Rest as much as you can.  Sleep with your head raised (elevated).  Make sure you get enough sleep each night. General instructions   Put a warm, moist washcloth on your face 3-4 times a day, or as often as told by your doctor. This will help with discomfort.  Wash your hands often with soap and water. If there is no soap and water, use hand sanitizer.  Do not smoke. Avoid being around people who are smoking (secondhand smoke).  Keep  all follow-up visits as told by your doctor. This is important. Contact a doctor if:  You have a fever.  Your symptoms get worse.  Your symptoms do not get better within 10 days. Get help right away if:  You have a very bad headache.  You cannot stop throwing up (vomiting).  You have very bad pain or swelling around your face or eyes.  You have trouble seeing.  You feel confused.  Your neck is stiff.  You have trouble breathing. Summary  Sinusitis is swelling of your sinuses. Sinuses are hollow spaces in the bones around your face.  This condition is caused by tissues in your nose that become inflamed or swollen. This traps germs. These can lead to infection.  If you were prescribed an antibiotic medicine, take it as told by your doctor. Do not stop taking it even if you start to feel better.  Keep all follow-up visits as told by your doctor. This is important. This information is not intended to replace advice given to you by your health care provider. Make sure you discuss any questions you have with your health care provider. Document Released: 01/28/2008 Document Revised: 01/11/2018 Document Reviewed: 01/11/2018 Elsevier Interactive Patient Education  2019 [employer].

## 2018-11-11 ENCOUNTER — Telehealth: Payer: Self-pay | Admitting: Family

## 2018-11-11 NOTE — Telephone Encounter (Signed)
Please review and advise.

## 2018-11-12 MED ORDER — AMOXICILLIN 500 MG PO CAPS: 500.0000 mg | ORAL_CAPSULE | Freq: Two times a day (BID) | ORAL | 0 refills | Status: DC

## 2018-11-12 NOTE — Telephone Encounter (Signed)
Patient states she can not take augmentin but can take amoxicillin 500

## 2018-11-12 NOTE — Telephone Encounter (Signed)
Prescription sent to pharmacy.

## 2018-11-12 NOTE — Telephone Encounter (Signed)
Patient aware.

## 2018-11-12 NOTE — Addendum Note (Signed)
Addended by: [name] A on: 11/12/2018 01:51 PM   Modules accepted: Orders

## 2018-11-12 NOTE — Telephone Encounter (Signed)
Ok, can patient take Augmentin? Per her allergy list she is allergic to cephalexin. It does not mention what happens when she takes this medication. Augmentin is in a different class as cephalexin, but could be a cross reaction.

## 2018-11-30 ENCOUNTER — Other Ambulatory Visit: Payer: Self-pay | Admitting: Family

## 2018-11-30 DIAGNOSIS — I10 Essential (primary) hypertension: Secondary | ICD-10-CM

## 2018-12-01 ENCOUNTER — Other Ambulatory Visit: Payer: Self-pay | Admitting: *Deleted

## 2018-12-01 DIAGNOSIS — Z79899 Other long term (current) drug therapy: Secondary | ICD-10-CM

## 2018-12-01 DIAGNOSIS — F411 Generalized anxiety disorder: Secondary | ICD-10-CM

## 2018-12-01 DIAGNOSIS — F1129 Opioid dependence with unspecified opioid-induced disorder: Secondary | ICD-10-CM

## 2018-12-06 ENCOUNTER — Telehealth: Payer: Self-pay

## 2018-12-06 NOTE — Telephone Encounter (Signed)
Last seen 11/08/2018  Dr Zenaida Niece PCP

## 2018-12-06 NOTE — Telephone Encounter (Signed)
Patient has a follow up appointment scheduled for first week of may, (did not want to be seen sooner because medications had already been filled).

## 2018-12-06 NOTE — Telephone Encounter (Signed)
It does not say what medication she needs but likely it looks like the patient is possibly due for an appointment anyways because she has not been seen except for acute visits since last September, we can put her on Christy schedule for a visit.

## 2018-12-21 ENCOUNTER — Other Ambulatory Visit: Payer: Self-pay | Admitting: Family

## 2018-12-21 DIAGNOSIS — K2100 Gastro-esophageal reflux disease with esophagitis, without bleeding: Secondary | ICD-10-CM

## 2018-12-28 ENCOUNTER — Ambulatory Visit (INDEPENDENT_AMBULATORY_CARE_PROVIDER_SITE_OTHER): Payer: Medicare HMO | Admitting: Family

## 2018-12-28 ENCOUNTER — Encounter: Payer: Self-pay | Admitting: Family

## 2018-12-28 ENCOUNTER — Other Ambulatory Visit: Payer: Self-pay

## 2018-12-28 VITALS — BP 139/73 | HR 73 | Temp 97.0°F | Ht 61.0 in | Wt 178.0 lb

## 2018-12-28 DIAGNOSIS — E039 Hypothyroidism, unspecified: Secondary | ICD-10-CM | POA: Diagnosis not present

## 2018-12-28 DIAGNOSIS — E785 Hyperlipidemia, unspecified: Secondary | ICD-10-CM | POA: Diagnosis not present

## 2018-12-28 DIAGNOSIS — K2100 Gastro-esophageal reflux disease with esophagitis, without bleeding: Secondary | ICD-10-CM

## 2018-12-28 DIAGNOSIS — E8881 Metabolic syndrome: Secondary | ICD-10-CM

## 2018-12-28 DIAGNOSIS — Z79899 Other long term (current) drug therapy: Secondary | ICD-10-CM | POA: Diagnosis not present

## 2018-12-28 DIAGNOSIS — E669 Obesity, unspecified: Secondary | ICD-10-CM

## 2018-12-28 DIAGNOSIS — R197 Diarrhea, unspecified: Secondary | ICD-10-CM

## 2018-12-28 DIAGNOSIS — I10 Essential (primary) hypertension: Secondary | ICD-10-CM | POA: Diagnosis not present

## 2018-12-28 DIAGNOSIS — F411 Generalized anxiety disorder: Secondary | ICD-10-CM

## 2018-12-28 DIAGNOSIS — F1320 Sedative, hypnotic or anxiolytic dependence, uncomplicated: Secondary | ICD-10-CM

## 2018-12-28 DIAGNOSIS — F1129 Opioid dependence with unspecified opioid-induced disorder: Secondary | ICD-10-CM

## 2018-12-28 DIAGNOSIS — E559 Vitamin D deficiency, unspecified: Secondary | ICD-10-CM

## 2018-12-28 MED ORDER — IRBESARTAN 75 MG PO TABS: 75.0000 mg | ORAL_TABLET | Freq: Every day | ORAL | 2 refills | Status: DC

## 2018-12-28 MED ORDER — LEVOTHYROXINE SODIUM 75 MCG PO TABS: ORAL_TABLET | ORAL | 3 refills | Status: DC

## 2018-12-28 MED ORDER — ALPRAZOLAM 0.25 MG PO TABS: 0.2500 mg | ORAL_TABLET | Freq: Every evening | ORAL | 5 refills | Status: DC | PRN

## 2018-12-28 MED ORDER — DICYCLOMINE HCL 20 MG PO TABS: 20.0000 mg | ORAL_TABLET | Freq: Three times a day (TID) | ORAL | 1 refills | Status: DC

## 2018-12-28 MED ORDER — DEXLANSOPRAZOLE 60 MG PO CPDR: DELAYED_RELEASE_CAPSULE | ORAL | 1 refills | Status: DC

## 2018-12-28 NOTE — Progress Notes (Signed)
Subjective:    Patient ID: Susan Contreras, female    DOB: [birth year], 78 y.o.   MRN: [id number]  Chief Complaint  Patient presents with  . Medical Management of Chronic Issues    refills and lab work   PT presents to the office today for chronic follow up.  Hypertension  This is a chronic problem. The current episode started more than 1 year ago. The problem has been resolved since onset. The problem is controlled. Associated symptoms include anxiety. Pertinent negatives include no malaise/fatigue, peripheral edema or shortness of breath. Risk factors for coronary artery disease include dyslipidemia, obesity and sedentary lifestyle. The current treatment provides moderate improvement. There is no history of kidney disease or CAD/MI. Identifiable causes of hypertension include a thyroid problem.  Hyperlipidemia  This is a chronic problem. The current episode started more than 1 year ago. The problem is uncontrolled. Recent lipid tests were reviewed and are high. Exacerbating diseases include obesity. Pertinent negatives include no shortness of breath. Current antihyperlipidemic treatment includes statins. The current treatment provides moderate improvement of lipids. Risk factors for coronary artery disease include dyslipidemia, female sex, hypertension and a sedentary lifestyle.  Gastroesophageal Reflux  She complains of heartburn. She reports no belching. This is a chronic problem. The current episode started more than 1 year ago. The problem occurs occasionally. The problem has been waxing and waning. The heartburn is of mild intensity. The symptoms are aggravated by certain foods and lying down. Pertinent negatives include no fatigue. Risk factors include obesity. She has tried a PPI for the symptoms. The treatment provided moderate relief.  Thyroid Problem  Presents for follow-up visit. Symptoms include anxiety and diarrhea. Patient reports no constipation, depressed mood or fatigue. The symptoms  have been stable. Her past medical history is significant for hyperlipidemia.  Anxiety  Presents for follow-up visit. Symptoms include excessive worry, irritability, nervous/anxious behavior and restlessness. Patient reports no depressed mood or shortness of breath. Symptoms occur occasionally. The severity of symptoms is moderate.    Metabolic Syndrome Does not do any scheduled exercise, but states she does all the house work by herself. Takes Lipitor daily. States her diet is good.     Review of Systems  Constitutional: Positive for irritability. Negative for fatigue and malaise/fatigue.  Respiratory: Negative for shortness of breath.   Gastrointestinal: Positive for diarrhea and heartburn. Negative for constipation.  Psychiatric/Behavioral: The patient is nervous/anxious.   All other systems reviewed and are negative.      Objective:   Physical Exam Vitals signs reviewed.  Constitutional:      General: She is not in acute distress.    Appearance: She is well-developed.  HENT:     Head: Normocephalic and atraumatic.     Right Ear: Tympanic membrane normal.     Left Ear: Tympanic membrane normal.  Eyes:     Pupils: Pupils are equal, round, and reactive to light.  Neck:     Musculoskeletal: Normal range of motion and neck supple.     Thyroid: No thyromegaly.  Cardiovascular:     Rate and Rhythm: Normal rate and regular rhythm.     Heart sounds: Normal heart sounds. No murmur.  Pulmonary:     Effort: Pulmonary effort is normal. No respiratory distress.     Breath sounds: Normal breath sounds. No wheezing.  Abdominal:     General: Bowel sounds are normal. There is no distension.     Palpations: Abdomen is soft.     Tenderness: There  is no abdominal tenderness.  Musculoskeletal: Normal range of motion.        General: No tenderness.  Skin:    General: Skin is warm and dry.  Neurological:     Mental Status: She is alert and oriented to person, place, and time.      Cranial Nerves: No cranial nerve deficit.     Deep Tendon Reflexes: Reflexes are normal and symmetric.  Psychiatric:        Behavior: Behavior normal.        Thought Content: Thought content normal.        Judgment: Judgment normal.       BP 139/73   Pulse 73   Temp (!) 97 F (36.1 C) (Oral)   Ht 5' 1"  (1.549 m)   Wt 178 lb (80.7 kg)   BMI 33.63 kg/m      Assessment & Plan:  Darline Faith comes in today with chief complaint of Medical Management of Chronic Issues (refills and lab work)   Diagnosis and orders addressed:  1. Controlled substance agreement signed - CMP14+EGFR - CBC with Differential/Platelet - ALPRAZolam (XANAX) 0.25 MG tablet; Take 1 tablet (0.25 mg total) by mouth at bedtime as needed for anxiety.  Dispense: 30 tablet; Refill: 5 - ToxASSURE Select 13 (MW), Urine  2. Essential hypertension - CMP14+EGFR - CBC with Differential/Platelet - irbesartan (AVAPRO) 75 MG tablet; Take 1 tablet (75 mg total) by mouth daily. (Please make 6 mos med ckup)  Dispense: 90 tablet; Refill: 2  3. GAD (generalized anxiety disorder) - CMP14+EGFR - CBC with Differential/Platelet - ALPRAZolam (XANAX) 0.25 MG tablet; Take 1 tablet (0.25 mg total) by mouth at bedtime as needed for anxiety.  Dispense: 30 tablet; Refill: 5 - ToxASSURE Select 13 (MW), Urine  4. Gastroesophageal reflux disease with esophagitis - CMP14+EGFR - CBC with Differential/Platelet - dexlansoprazole (DEXILANT) 60 MG capsule; Take 1 capsule (60 mg total) by mouth daily.  Dispense: 90 capsule; Refill: 1  5. Vitamin D deficiency - CMP14+EGFR - CBC with Differential/Platelet  6. Benzodiazepine dependence (HCC) - CMP14+EGFR - CBC with Differential/Platelet - ToxASSURE Select 13 (MW), Urine  7. Hyperlipidemia, unspecified hyperlipidemia type - CMP14+EGFR - CBC with Differential/Platelet - Lipid panel  8. Hypothyroidism, unspecified type - CMP14+EGFR - CBC with Differential/Platelet - TSH -  levothyroxine (SYNTHROID) 75 MCG tablet; TAKE (1) TABLET DAILY BE- FORE BREAKFAST.  Dispense: 90 tablet; Refill: 3  9. Metabolic syndrome - XBO47+QSXQ - CBC with Differential/Platelet  10. Obesity (BMI 30.0-34.9) - CMP14+EGFR - CBC with Differential/Platelet  11. Opioid dependence with opioid-induced disorder (HCC) - ALPRAZolam (XANAX) 0.25 MG tablet; Take 1 tablet (0.25 mg total) by mouth at bedtime as needed for anxiety.  Dispense: 30 tablet; Refill: 5  12. Diarrhea, unspecified type - dicyclomine (BENTYL) 20 MG tablet; Take 1 tablet (20 mg total) by mouth 4 (four) times daily -  before meals and at bedtime.  Dispense: 90 tablet; Refill: 1   Labs pending Health Maintenance reviewed Diet and exercise encouraged  Follow up plan: 3 months    [name], FNP

## 2018-12-28 NOTE — Patient Instructions (Signed)
Health Maintenance After Age 76 After age 76, you are at a higher risk for certain long-term diseases and infections as well as injuries from falls. Falls are a major cause of broken bones and head injuries in people who are older than age 76. Getting regular preventive care can help to keep you healthy and well. Preventive care includes getting regular testing and making lifestyle changes as recommended by your health care provider. Talk with your health care provider about:  Which screenings and tests you should have. A screening is a test that checks for a disease when you have no symptoms.  A diet and exercise plan that is right for you. What should I know about screenings and tests to prevent falls? Screening and testing are the best ways to find a health problem early. Early diagnosis and treatment give you the best chance of managing medical conditions that are common after age 76. Certain conditions and lifestyle choices may make you more likely to have a fall. Your health care provider may recommend:  Regular vision checks. Poor vision and conditions such as cataracts can make you more likely to have a fall. If you wear glasses, make sure to get your prescription updated if your vision changes.  Medicine review. Work with your health care provider to regularly review all of the medicines you are taking, including over-the-counter medicines. Ask your health care provider about any side effects that may make you more likely to have a fall. Tell your health care provider if any medicines that you take make you feel dizzy or sleepy.  Osteoporosis screening. Osteoporosis is a condition that causes the bones to get weaker. This can make the bones weak and cause them to break more easily.  Blood pressure screening. Blood pressure changes and medicines to control blood pressure can make you feel dizzy.  Strength and balance checks. Your health care provider may recommend certain tests to check your  strength and balance while standing, walking, or changing positions.  Foot health exam. Foot pain and numbness, as well as not wearing proper footwear, can make you more likely to have a fall.  Depression screening. You may be more likely to have a fall if you have a fear of falling, feel emotionally low, or feel unable to do activities that you used to do.  Alcohol use screening. Using too much alcohol can affect your balance and may make you more likely to have a fall. What actions can I take to lower my risk of falls? General instructions  Talk with your health care provider about your risks for falling. Tell your health care provider if: ? You fall. Be sure to tell your health care provider about all falls, even ones that seem minor. ? You feel dizzy, sleepy, or off-balance.  Take over-the-counter and prescription medicines only as told by your health care provider. These include any supplements.  Eat a healthy diet and maintain a healthy weight. A healthy diet includes low-fat dairy products, low-fat (lean) meats, and fiber from whole grains, beans, and lots of fruits and vegetables. Home safety  Remove any tripping hazards, such as rugs, cords, and clutter.  Install safety equipment such as grab bars in bathrooms and safety rails on stairs.  Keep rooms and walkways well-lit. Activity   Follow a regular exercise program to stay fit. This will help you maintain your balance. Ask your health care provider what types of exercise are appropriate for you.  If you need a cane or   walker, use it as recommended by your health care provider.  Wear supportive shoes that have nonskid soles. Lifestyle  Do not drink alcohol if your health care provider tells you not to drink.  If you drink alcohol, limit how much you have: ? 0-1 drink a day for women. ? 0-2 drinks a day for men.  Be aware of how much alcohol is in your drink. In the [place] one drink equals one typical bottle of beer (12  oz), one-half glass of wine (5 oz), or one shot of hard liquor (1 oz).  Do not use any products that contain nicotine or tobacco, such as cigarettes and e-cigarettes. If you need help quitting, ask your health care provider. Summary  Having a healthy lifestyle and getting preventive care can help to protect your health and wellness after age 76.  Screening and testing are the best way to find a health problem early and help you avoid having a fall. Early diagnosis and treatment give you the best chance for managing medical conditions that are more common for people who are older than age 76.  Falls are a major cause of broken bones and head injuries in people who are older than age 76. Take precautions to prevent a fall at home.  Work with your health care provider to learn what changes you can make to improve your health and wellness and to prevent falls. This information is not intended to replace advice given to you by your health care provider. Make sure you discuss any questions you have with your health care provider. Document Released: 06/24/2017 Document Revised: 06/24/2017 Document Reviewed: 06/24/2017 Elsevier Interactive Patient Education  2019 [employer]  

## 2018-12-29 ENCOUNTER — Other Ambulatory Visit: Payer: Self-pay | Admitting: Family

## 2018-12-29 LAB — CBC WITH DIFFERENTIAL/PLATELET
Basophils Absolute: 0.1 10*3/uL (ref 0.0–0.2)
Basos: 1 %
EOS (ABSOLUTE): 0.4 10*3/uL (ref 0.0–0.4)
Eos: 5 %
Hematocrit: 41.5 % (ref 34.0–46.6)
Hemoglobin: 13.5 g/dL (ref 11.1–15.9)
Immature Grans (Abs): 0 10*3/uL (ref 0.0–0.1)
Immature Granulocytes: 0 %
Lymphocytes Absolute: 3.4 10*3/uL — ABNORMAL HIGH (ref 0.7–3.1)
Lymphs: 40 %
MCH: 28.8 pg (ref 26.6–33.0)
MCHC: 32.5 g/dL (ref 31.5–35.7)
MCV: 89 fL (ref 79–97)
Monocytes Absolute: 0.6 10*3/uL (ref 0.1–0.9)
Monocytes: 7 %
Neutrophils Absolute: 4.1 10*3/uL (ref 1.4–7.0)
Neutrophils: 47 %
Platelets: 309 10*3/uL (ref 150–450)
RBC: 4.68 x10E6/uL (ref 3.77–5.28)
RDW: 13.3 % (ref 11.7–15.4)
WBC: 8.6 10*3/uL (ref 3.4–10.8)

## 2018-12-29 LAB — LIPID PANEL
Chol/HDL Ratio: 6.7 ratio — ABNORMAL HIGH (ref 0.0–4.4)
Cholesterol, Total: 263 mg/dL — ABNORMAL HIGH (ref 100–199)
HDL: 39 mg/dL — ABNORMAL LOW (ref >39)
Triglycerides: 508 mg/dL — ABNORMAL HIGH (ref 0–149)

## 2018-12-29 LAB — CMP14+EGFR
ALT: 9 IU/L (ref 0–32)
AST: 15 IU/L (ref 0–40)
Albumin/Globulin Ratio: 1.4 (ref 1.2–2.2)
Albumin: 4.4 g/dL (ref 3.7–4.7)
Alkaline Phosphatase: 89 IU/L (ref 39–117)
BUN/Creatinine Ratio: 7 — ABNORMAL LOW (ref 12–28)
BUN: 10 mg/dL (ref 8–27)
Bilirubin Total: 0.4 mg/dL (ref 0.0–1.2)
CO2: 21 mmol/L (ref 20–29)
Calcium: 9.6 mg/dL (ref 8.7–10.3)
Chloride: 100 mmol/L (ref 96–106)
Creatinine, Ser: 1.38 mg/dL — ABNORMAL HIGH (ref 0.57–1.00)
GFR calc Af Amer: 43 mL/min/{1.73_m2} — ABNORMAL LOW (ref >59)
GFR calc non Af Amer: 37 mL/min/{1.73_m2} — ABNORMAL LOW (ref >59)
Globulin, Total: 3.1 g/dL (ref 1.5–4.5)
Glucose: 93 mg/dL (ref 65–99)
Potassium: 4.9 mmol/L (ref 3.5–5.2)
Sodium: 135 mmol/L (ref 134–144)
Total Protein: 7.5 g/dL (ref 6.0–8.5)

## 2018-12-29 LAB — TSH: TSH: 1.76 u[IU]/mL (ref 0.450–4.500)

## 2018-12-31 LAB — TOXASSURE SELECT 13 (MW), URINE

## 2019-01-04 ENCOUNTER — Ambulatory Visit: Payer: Medicare HMO | Admitting: Family

## 2019-01-04 ENCOUNTER — Telehealth: Payer: Self-pay | Admitting: Family

## 2019-01-04 ENCOUNTER — Other Ambulatory Visit: Payer: Self-pay | Admitting: Family

## 2019-01-04 MED ORDER — PRAVASTATIN SODIUM 10 MG PO TABS: 10.0000 mg | ORAL_TABLET | Freq: Every day | ORAL | 1 refills | Status: DC

## 2019-01-04 NOTE — Telephone Encounter (Signed)
PT is calling to get lab results

## 2019-01-04 NOTE — Telephone Encounter (Signed)
Patient aware of lab results.

## 2019-03-15 ENCOUNTER — Telehealth: Payer: Self-pay | Admitting: Family

## 2019-03-15 ENCOUNTER — Ambulatory Visit (INDEPENDENT_AMBULATORY_CARE_PROVIDER_SITE_OTHER): Payer: Medicare HMO | Admitting: Family Medicine

## 2019-03-15 ENCOUNTER — Other Ambulatory Visit: Payer: Self-pay

## 2019-03-15 DIAGNOSIS — Z23 Encounter for immunization: Secondary | ICD-10-CM

## 2019-03-15 DIAGNOSIS — T148XXA Other injury of unspecified body region, initial encounter: Secondary | ICD-10-CM

## 2019-03-15 DIAGNOSIS — W5503XA Scratched by cat, initial encounter: Secondary | ICD-10-CM

## 2019-03-15 NOTE — Patient Instructions (Signed)
Puncture Wound A puncture wound is an injury that is caused by a sharp, thin object that goes through your skin. A puncture wound usually does not leave a large opening in your skin, so it may not bleed a lot. However, when you get a puncture wound, dirt or other materials (foreign bodies) can be forced into your wound and can break off inside. This increases the chance of infection, such as tetanus. There are many sharp, pointed objects that can cause puncture wounds, including teeth, nails, splinters of glass, fishhooks, and needles. Treatment may include the following steps:  Washing out the wound with a germ-free (sterile) salt-water solution.  Having surgery to open the wound and remove materials from it.  Closing the wound with stitches (sutures).  Covering the wound with antibiotic ointment and a bandage (dressing). Depending on what caused the injury, you may also need a tetanus shot or a rabies shot. Follow these instructions at home: Medicines  Take or apply over-the-counter and prescription medicines only as told by your doctor.  If you were prescribed an antibiotic medicine, take or apply it as told by your doctor. Do not stop using the antibiotic even if your condition starts to get better. Bathing  Keep the bandage dry as told by your doctor.  Do not take baths, swim, or use a hot tub until your doctor approves. Ask your doctor if you may take showers. You may only be allowed to take sponge baths. Wound care   There are many ways to close and cover a wound. For example, a wound can be closed with stitches, skin glue, or skin tape (adhesive strips). Follow instructions from your doctor about how to take care of your wound. Make sure you: ? Wash your hands with soap and water before and after you change your bandage. If you cannot use soap and water, use hand sanitizer. ? Change your bandage as told by your doctor. ? Leave stitches, skin glue, or skin tape strips in place.  They may need to stay in place for 2 weeks or longer. If tape strips get loose and curl up, you may trim the loose edges. Do not remove tape strips completely unless your doctor says it is okay.  Clean the wound as told by your doctor.  Do not scratch or pick at the wound.  Check your wound every day for signs of infection. Watch for: ? Redness, swelling, or pain. ? Fluid or blood. ? Warmth. ? Pus or a bad smell. General instructions  Raise (elevate) the injured area above the level of your heart while you are sitting or lying down.  If your puncture wound is in your foot, ask your doctor if you need to avoid putting weight on your foot and for how long. Use crutches as told by your doctor.  Keep all follow-up visits as told by your doctor. This is important. Contact a doctor if:  You got a tetanus shot and you have any of these problems at the injection site: ? Swelling. ? Very bad pain. ? Redness. ? Bleeding.  You have a fever.  Your stitches come out.  You notice a bad smell coming from your wound or your bandage.  You notice something coming out of the wound, such as wood or glass.  Medicine does not help your pain.  You have more redness, swelling, or pain at the site of your wound.  You have fluid, blood, or pus coming from your wound.  You notice   a change in the color of your skin near your wound.  You need to change the bandage often because fluid, blood, or pus is coming from the wound.  You start to have a new rash.  You start to lose feeling (have numbness) around the wound.  You have warmth around your wound. Get help right away if:  You have very bad swelling around the wound.  Your pain quickly gets worse and is very bad.  You start to get painful skin lumps.  You have a red streak going away from your wound.  The wound is on your hand or foot and you: ? Cannot move a finger or toe like normal. ? Notice that your fingers or toes look pale or  blue. Summary  A puncture wound is an injury that is caused by a sharp, thin object that goes through your skin.  Treatment may include washing out the wound, having surgery to open the wound to clean it, closing the wound, and covering the wound with a bandage.  Follow instructions from your doctor about how to take care of your wound.  Contact your doctor if you have more redness, swelling, or pain at the site of your wound.  Keep all follow-up visits as told by your doctor. This is important. This information is not intended to replace advice given to you by your health care provider. Make sure you discuss any questions you have with your health care provider. Document Released: 05/20/2008 Document Revised: 03/18/2018 Document Reviewed: 03/18/2018 Elsevier Patient Education  2020 [employer].

## 2019-03-15 NOTE — Telephone Encounter (Signed)
televisit scheduled at 3:45 today

## 2019-03-15 NOTE — Progress Notes (Signed)
Telephone visit  Subjective: CC: cat scratch PCP: Susan Balloon, FNP YJE:HUDJSH Contreras is a 78 y.o. female calls for telephone consult today. Patient provides verbal consent for consult held via phone.  Location of patient: home Location of provider: Working remotely from home Others present for call: husband, Susan Contreras  1. Cat scratch Patient reports that she was scratched by her daughter's cat.  She reports that the nails punctured her thigh/ groin area through her clothing.  She reports that the cat is UTD on shots but she cannot remember the last time she had a tetanus shot.  She put peroxide and neosporin on it.  She denies significant erythema. No exudate or pain.   ROS: Per HPI  Allergies  Allergen Reactions  . Biaxin [Clarithromycin]   . Cephalexin   . Ciprofloxacin   . Doxycycline     Headaches  . Flagyl [Metronidazole Hcl]   . Ketek [Telithromycin]   . Sulfa Antibiotics    Past Medical History:  Diagnosis Date  . Anxiety   . Depression   . Diverticulosis   . Esophageal stricture   . Family hx of colorectal cancer   . GERD (gastroesophageal reflux disease)   . Helicobacter pylori (H. pylori) 1997   EGD  . Hemorrhoids   . Hiatal hernia   . Hx of adenomatous colonic polyps   . Hyperlipemia   . Hypertension   . PUD (peptic ulcer disease) 1997   EGD  . Thyroid disease   . Vertigo     Current Outpatient Medications:  .  ALPRAZolam (XANAX) 0.25 MG tablet, Take 1 tablet (0.25 mg total) by mouth at bedtime as needed for anxiety., Disp: 30 tablet, Rfl: 5 .  dexlansoprazole (DEXILANT) 60 MG capsule, Take 1 capsule (60 mg total) by mouth daily., Disp: 90 capsule, Rfl: 1 .  dicyclomine (BENTYL) 20 MG tablet, Take 1 tablet (20 mg total) by mouth 4 (four) times daily -  before meals and at bedtime., Disp: 90 tablet, Rfl: 1 .  fluticasone (FLONASE) 50 MCG/ACT nasal spray, Place 1 spray into both nostrils daily., Disp: 16 g, Rfl: 5 .  irbesartan (AVAPRO) 75 MG tablet, Take  1 tablet (75 mg total) by mouth daily. (Please make 6 mos med ckup), Disp: 90 tablet, Rfl: 2 .  levothyroxine (SYNTHROID) 75 MCG tablet, TAKE (1) TABLET DAILY BE- FORE BREAKFAST., Disp: 90 tablet, Rfl: 3 .  meclizine (ANTIVERT) 25 MG tablet, TAKE ONE TABLET BY MOUTH THREE TIMES DAILY AS NEEDED FOR DIZZINESS, Disp: 30 tablet, Rfl: 4 .  pravastatin (PRAVACHOL) 10 MG tablet, Take 1 tablet (10 mg total) by mouth daily., Disp: 90 tablet, Rfl: 1  Assessment/ Plan: 78 y.o. female   1. Cat scratch No empiric antibiotics needed at this time.  We discussed continuing keeping the area clean, monitoring for signs and symptoms of infection.  She has not had a tetanus shot in a while and I do recommend that she proceed with this given the puncture nature of the cat scratch.  She will come to the office to have this administered.  2. Puncture wound  3. Need for tetanus booster Patient came into the office for booster and it was administered by [facility].    Start time: 2:58pm End time: 3:04pm  Total time spent on patient care (including telephone call/ virtual visit): 9 minutes  Susan Contreras, [place] [phone]

## 2019-03-23 ENCOUNTER — Telehealth: Payer: Self-pay | Admitting: Family

## 2019-03-23 NOTE — Chronic Care Management (AMB) (Signed)
Chronic Care Management   Note  03/23/2019 Name: Susan Contreras MRN: [id number] DOB: 1941-05-16  Susan Contreras is a 78 y.o. year old female who is a primary care patient of [name], FNP. I reached out to Susan Contreras by phone today in response to a referral sent by Susan Contreras's health plan.    Susan Contreras was given information about Chronic Care Management services today including:  1. CCM service includes personalized support from designated clinical staff supervised by her physician, including individualized plan of care and coordination with other care providers 2. 24/7 contact phone numbers for assistance for urgent and routine care needs. 3. Service will only be billed when office clinical staff spend 20 minutes or more in a month to coordinate care. 4. Only one practitioner may furnish and bill the service in a calendar month. 5. The patient may stop CCM services at any time (effective at the end of the month) by phone call to the office staff. 6. The patient will be responsible for cost sharing (co-pay) of up to 20% of the service fee (after annual deductible is met).  Patient did not agree to enrollment in care management services and does not wish to consider at this time.  Follow up plan: The patient has been provided with contact information for the chronic care management team and has been advised to call with any health related questions or concerns.   [place]  [email or web address]   ??[phone]

## 2019-03-28 ENCOUNTER — Ambulatory Visit: Payer: Medicare HMO | Admitting: Family Medicine

## 2019-04-07 ENCOUNTER — Encounter: Payer: Self-pay | Admitting: Family

## 2019-04-07 ENCOUNTER — Other Ambulatory Visit: Payer: Self-pay

## 2019-04-07 ENCOUNTER — Ambulatory Visit (INDEPENDENT_AMBULATORY_CARE_PROVIDER_SITE_OTHER): Payer: Medicare HMO | Admitting: Family

## 2019-04-07 DIAGNOSIS — J301 Allergic rhinitis due to pollen: Secondary | ICD-10-CM | POA: Diagnosis not present

## 2019-04-07 DIAGNOSIS — J3089 Other allergic rhinitis: Secondary | ICD-10-CM

## 2019-04-07 MED ORDER — CETIRIZINE HCL 10 MG PO TABS: 10.0000 mg | ORAL_TABLET | Freq: Every day | ORAL | 11 refills | Status: DC

## 2019-04-07 MED ORDER — FLUTICASONE PROPIONATE 50 MCG/ACT NA SUSP: 1.0000 | Freq: Every day | NASAL | 5 refills | Status: DC

## 2019-04-07 NOTE — Progress Notes (Signed)
Virtual Visit via telephone Note Due to COVID-19 pandemic this visit was conducted virtually. This visit type was conducted due to national recommendations for restrictions regarding the COVID-19 Pandemic (e.g. social distancing, sheltering in place) in an effort to limit this patient's exposure and mitigate transmission in our community. All issues noted in this document were discussed and addressed.  A physical exam was not performed with this format.  I connected with Susan Contreras on 04/07/19 at 9:55 AM by telephone and verified that I am speaking with the correct person using two identifiers. Susan Contreras is currently located at home and husband is currently with her during visit. The provider, [name], FNP is located in their home at time of visit.  I discussed the limitations, risks, security and privacy concerns of performing an evaluation and management service by telephone and the availability of in person appointments. I also discussed with the patient that there may be a patient responsible charge related to this service. The patient expressed understanding and agreed to proceed.   History and Present Illness:  Headache  This is a new problem. The current episode started in the past 7 days. The problem occurs intermittently. The problem has been waxing and waning. The pain is located in the right unilateral region. The pain quality is similar to prior headaches. The quality of the pain is described as aching. The pain is moderate. Associated symptoms include coughing, ear pain, rhinorrhea and a sore throat. Pertinent negatives include no photophobia. The treatment provided mild relief.  Cough This is a new problem. The current episode started 1 to 4 weeks ago. The problem has been waxing and waning. The problem occurs every few minutes. The cough is non-productive. Associated symptoms include ear pain, headaches, nasal congestion, postnasal drip, rhinorrhea and a sore throat. She  has tried rest and OTC cough suppressant for the symptoms. The treatment provided mild relief.      Review of Systems  HENT: Positive for ear pain, postnasal drip, rhinorrhea and sore throat.   Eyes: Negative for photophobia.  Respiratory: Positive for cough.   Neurological: Positive for headaches.  All other systems reviewed and are negative.    Observations/Objective: No SOB or Distress noted  Assessment and Plan: 1. Allergic rhinitis due to other allergic trigger, unspecified seasonality  2. Allergic rhinitis due to pollen, unspecified seasonality - fluticasone (FLONASE) 50 MCG/ACT nasal spray; Place 1 spray into both nostrils daily.  Dispense: 16 g; Refill: 5 - cetirizine (ZYRTEC) 10 MG tablet; Take 1 tablet (10 mg total) by mouth daily.  Dispense: 30 tablet; Refill: 11  PT does not wish to get COVID tested as she states she has had these symptoms her "whole life" Discussed importance of self isolation. She states she is not leaving her house and has not been out in weeks.  She will start the zyrtec and flonase and if symptoms do not improve by Monday she will call and I will give her an antibiotics  [employer] fluids Red flags discussed    I discussed the assessment and treatment plan with the patient. The patient was provided an opportunity to ask questions and all were answered. The patient agreed with the plan and demonstrated an understanding of the instructions.   The patient was advised to call back or seek an in-person evaluation if the symptoms worsen or if the condition fails to improve as anticipated.  The above assessment and management plan was discussed with the patient. The patient verbalized understanding  of and has agreed to the management plan. Patient is aware to call the clinic if symptoms persist or worsen. Patient is aware when to return to the clinic for a follow-up visit. Patient educated on when it is appropriate to go to the emergency department.    Time call ended:  10:11 AM  I provided 16 minutes of non-face-to-face time during this encounter.    [name], FNP

## 2019-04-12 ENCOUNTER — Telehealth: Payer: Self-pay | Admitting: Family

## 2019-04-12 MED ORDER — AMOXICILLIN-POT CLAVULANATE 875-125 MG PO TABS: 1.0000 | ORAL_TABLET | Freq: Two times a day (BID) | ORAL | 0 refills | Status: DC

## 2019-04-12 MED ORDER — AZITHROMYCIN 250 MG PO TABS: ORAL_TABLET | ORAL | 0 refills | Status: DC

## 2019-04-12 NOTE — Telephone Encounter (Signed)
Patient aware.

## 2019-04-12 NOTE — Telephone Encounter (Signed)
Augmentin Prescription sent to pharmacy

## 2019-04-12 NOTE — Telephone Encounter (Signed)
Zpak Prescription sent to pharmacy. Pt should not pick up Augmentin prescription.

## 2019-05-03 ENCOUNTER — Encounter: Payer: Self-pay | Admitting: Physician Assistant

## 2019-05-03 ENCOUNTER — Other Ambulatory Visit: Payer: Self-pay

## 2019-05-03 ENCOUNTER — Ambulatory Visit: Payer: Medicare HMO | Admitting: Physician Assistant

## 2019-05-03 VITALS — BP 132/78 | HR 96 | Temp 98.5°F | Ht 61.5 in | Wt 177.4 lb

## 2019-05-03 DIAGNOSIS — R1011 Right upper quadrant pain: Secondary | ICD-10-CM

## 2019-05-03 DIAGNOSIS — K219 Gastro-esophageal reflux disease without esophagitis: Secondary | ICD-10-CM | POA: Diagnosis not present

## 2019-05-03 DIAGNOSIS — K2100 Gastro-esophageal reflux disease with esophagitis, without bleeding: Secondary | ICD-10-CM

## 2019-05-03 DIAGNOSIS — R197 Diarrhea, unspecified: Secondary | ICD-10-CM

## 2019-05-03 DIAGNOSIS — K589 Irritable bowel syndrome without diarrhea: Secondary | ICD-10-CM | POA: Diagnosis not present

## 2019-05-03 MED ORDER — DICYCLOMINE HCL 10 MG PO CAPS: 10.0000 mg | ORAL_CAPSULE | Freq: Two times a day (BID) | ORAL | 8 refills | Status: DC

## 2019-05-03 MED ORDER — DEXILANT 60 MG PO CPDR: 60.0000 mg | DELAYED_RELEASE_CAPSULE | Freq: Every day | ORAL | 3 refills | Status: DC

## 2019-05-03 NOTE — Progress Notes (Signed)
Assessment and plan reviewed 

## 2019-05-03 NOTE — Patient Instructions (Signed)
We have sent the following medications to your pharmacy for you to pick up at your convenience:  Bentyl, Dexter have been given a low gas diet.

## 2019-05-03 NOTE — Progress Notes (Signed)
Subjective:    Patient ID: Susan Contreras, female    DOB: [birth year], 78 y.o.   MRN: [id number]  HPI [name] is a pleasant 78 year old white female, established with Dr. Henrene Pastor, who comes in today with complaints of intermittent right upper quadrant pain and loose stools. She has history of IBS, is status post cholecystectomy, known diverticulosis and history of colon polyps, hypertension, GERD and chronic kidney disease. She was last seen in our office in August 2016.  At that time follow-up colonoscopy was discussed as she had not had colonoscopy or upper endoscopy since 2007.  She declined colonoscopy and was to do Cologuard but never completed.  She says she has been stressed over the past couple of years as her husband is chronically ill.  She has developed some intermittent right upper quadrant pain is not present daily described as sharp.  This seems to be exacerbated by bending over. She is on Dexilant 60 mg p.o. daily for GERD.  She says this works fairly well, occasional days of increased gas. She has been having loose stools over the past several months.  She says usually her bowel movement will start out fairly normal and then and as loose 1-2 daily.  She had been using MiraLAX as needed but stopped that when she developed the loose stools.  She has ongoing intermittent gas and bloating. She has a prescription for Bentyl at home, but has not been using and asked again today how she should use that.   Review of Systems Pertinent positive and negative review of systems were noted in the above HPI section.  All other review of systems was otherwise negative.  Outpatient Encounter Medications as of 05/03/2019  Medication Sig  . ALPRAZolam (XANAX) 0.25 MG tablet Take 1 tablet (0.25 mg total) by mouth at bedtime as needed for anxiety.  . cetirizine (ZYRTEC) 10 MG tablet Take 1 tablet (10 mg total) by mouth daily. (Patient taking differently: Take 10 mg by mouth every other day. )  .  Chlorphen-PE-Acetaminophen (NOREL AD PO) Take 1 tablet by mouth every other day.  Marland Kitchen dexlansoprazole (DEXILANT) 60 MG capsule Take 1 capsule (60 mg total) by mouth daily. Take 1 capsule (60 mg total) by mouth daily.  . fluticasone (FLONASE) 50 MCG/ACT nasal spray Place 1 spray into both nostrils daily.  . irbesartan (AVAPRO) 75 MG tablet Take 1 tablet (75 mg total) by mouth daily. (Please make 6 mos med ckup)  . levothyroxine (SYNTHROID) 75 MCG tablet TAKE (1) TABLET DAILY BE- FORE BREAKFAST.  Marland Kitchen meclizine (ANTIVERT) 25 MG tablet TAKE ONE TABLET BY MOUTH THREE TIMES DAILY AS NEEDED FOR DIZZINESS  . [DISCONTINUED] dexlansoprazole (DEXILANT) 60 MG capsule Take 1 capsule (60 mg total) by mouth daily.  Marland Kitchen dicyclomine (BENTYL) 10 MG capsule Take 1 capsule (10 mg total) by mouth 2 (two) times daily.  . [DISCONTINUED] amoxicillin-clavulanate (AUGMENTIN) 875-125 MG tablet Take 1 tablet by mouth 2 (two) times daily.  . [DISCONTINUED] azithromycin (ZITHROMAX) 250 MG tablet Take 500 mg once, then 250 mg for four days  . [DISCONTINUED] dicyclomine (BENTYL) 20 MG tablet Take 1 tablet (20 mg total) by mouth 4 (four) times daily -  before meals and at bedtime. (Patient not taking: Reported on 05/03/2019)  . [DISCONTINUED] pravastatin (PRAVACHOL) 10 MG tablet Take 1 tablet (10 mg total) by mouth daily.   No facility-administered encounter medications on file as of 05/03/2019.    Allergies  Allergen Reactions  . Biaxin [Clarithromycin]   . Cephalexin   .  Ciprofloxacin   . Doxycycline     Headaches  . Flagyl [Metronidazole Hcl]   . Ketek [Telithromycin]   . Sulfa Antibiotics    Patient Active Problem List   Diagnosis Date Noted  . Benzodiazepine dependence ([place]) 05/19/2018  . Controlled substance agreement signed 05/19/2018  . Obesity (BMI 30.0-34.9) 11/26/2017  . Metabolic syndrome [id number]  . GAD (generalized anxiety disorder) 08/14/2015  . Allergic rhinitis 08/14/2015  . GERD (gastroesophageal reflux  disease) 08/14/2015  . Hyperlipidemia 08/14/2015  . Vitamin D deficiency 08/14/2015  . Hypothyroidism 05/29/2014  . Cholelithiasis 01/15/2011  . ADENOMATOUS COLONIC POLYP 11/11/2007  . Essential hypertension 11/11/2007  . ESOPHAGEAL STRICTURE 11/11/2007  . Diaphragmatic hernia 11/11/2007  . DIVERTICULOSIS OF COLON 11/11/2007   Social History   Socioeconomic History  . Marital status: Married    Spouse name: Not on file  . Number of children: 2  . Years of education: Not on file  . Highest education level: Not on file  Occupational History  . Occupation: Retired  Scientific laboratory technician  . Financial resource strain: Not on file  . Food insecurity    Worry: Not on file    Inability: Not on file  . Transportation needs    Medical: Not on file    Non-medical: Not on file  Tobacco Use  . Smoking status: Never Smoker  . Smokeless tobacco: Never Used  Substance and Sexual Activity  . Alcohol use: No  . Drug use: No  . Sexual activity: Not on file  Lifestyle  . Physical activity    Days per week: Not on file    Minutes per session: Not on file  . Stress: Not on file  Relationships  . Social Herbalist on phone: Not on file    Gets together: Not on file    Attends religious service: Not on file    Active member of club or organization: Not on file    Attends meetings of clubs or organizations: Not on file    Relationship status: Not on file  . Intimate partner violence    Fear of current or ex partner: Not on file    Emotionally abused: Not on file    Physically abused: Not on file    Forced sexual activity: Not on file  Other Topics Concern  . Not on file  Social History Narrative   2 caffeine drinks daily     Ms. Cerniglia's family history includes Colon cancer in her sister; Heart disease in her mother; Ovarian cancer in her sister; Parkinson's disease in her father; Stroke in her mother.      Objective:    Vitals:   05/03/19 0956  BP: 132/78  Pulse: 96   Temp: 98.5 F (36.9 C)    Physical Exam Well-developed well-nourished elderly white female in no acute distress.   Weight, 177 BMI, 32.9  HEENT; nontraumatic normocephalic, EOMI, PE R LA, sclera anicteric. Oropharynx; not examined/mask/COVID Neck; supple, no JVD Cardiovascular; regular rate and rhythm with S1-S2, no murmur rub or gallop Pulmonary; Clear bilaterally Abdomen; soft, nontender, nondistended, no palpable mass or hepatosplenomegaly, bowel sounds are active, low midline incisional scar Rectal; not done today Skin; benign exam, no jaundice rash or appreciable lesions Extremities; no clubbing cyanosis or edema skin warm and dry Neuro/Psych; alert and oriented x4, grossly nonfocal mood and affect appropriate       Assessment & Plan:   #28 78 year old white female with history of IBS, now presenting with  intermittent right upper quadrant pain exacerbated by bending over, abdominal gas bloating and loose stools.  Right upper quadrant pain has been present intermittently over the past couple of years, and other symptoms have been worse over the past several months.  I think symptoms are related to IBS.  Right upper quadrant discomfort and postcholecystectomy patient has abated by bending may be related to adhesions. She had CT of the abdomen and pelvis done in July 2019 which was unremarkable.  I do not think she needs further imaging at this time.  #2 chronic GERD-stable #3 colon cancer surveillance-overdue for colonoscopy.  Patient has declined colonoscopy in the past #4 diverticulosis #5.  Chronic kidney disease  Plan; Low gas diet Patient advised to try Bentyl 10 mg p.o. every morning take regularly over the next couple of months, she can take a second dose mid afternoon as needed. Continue Dexilant 60 mg p.o. every morning Patient will follow-up with Dr. Henrene Pastor or myself, as needed and knows to call for advice if above measures are not helpful.  Deondre Marinaro Genia Harold PA-C  05/03/2019   Cc: [name], FNP

## 2019-05-23 ENCOUNTER — Telehealth: Payer: Self-pay | Admitting: Physician Assistant

## 2019-05-23 NOTE — Telephone Encounter (Signed)
The pt was advised to stop the pepto and take the bentyl as prescribed.  She has not taken it yet.  She states she thought the pepto would work better.  I discussed that the Bentyl has been prescribed for the abd discomfort and will help with diarrhea.  The pt has been advised of the information and verbalized understanding.

## 2019-05-24 ENCOUNTER — Telehealth: Payer: Self-pay | Admitting: Physician Assistant

## 2019-05-24 NOTE — Telephone Encounter (Signed)
Pt states that bentyl helped yesterday but today diarrhea is back again. Pls call. Her.

## 2019-05-24 NOTE — Telephone Encounter (Signed)
Reports diarrhea this morning when she got up. "everything went right through me." She ate , then took her medications including Bentyl and Dexilant. States she is better now. Eating soup and crackers. No further diarrhea stools. Reminded her it is okay to take a second dose of Bentyl if she needs to. The patient wants a follow up by a virtual visit. Please advise on this.

## 2019-05-25 NOTE — Telephone Encounter (Signed)
That's OK, though  dont think I have any appt's open next week .  May be a good idea for her to call back and speak with you early next next week with a symptom update, then we can figure out how to proceed.

## 2019-05-26 NOTE — Telephone Encounter (Signed)
Spoke with the patient. She states she is having a better day and she thinks "that medication is really helping." Agrees to talk again next week.

## 2019-05-29 ENCOUNTER — Telehealth: Payer: Self-pay | Admitting: Physician Assistant

## 2019-05-31 ENCOUNTER — Other Ambulatory Visit: Payer: Self-pay

## 2019-05-31 ENCOUNTER — Emergency Department (HOSPITAL_COMMUNITY)
Admission: EM | Admit: 2019-05-31 | Discharge: 2019-05-31 | Discharge status: Against Medical Advice | Payer: Medicare HMO | Attending: Emergency Medicine | Admitting: Emergency Medicine

## 2019-05-31 ENCOUNTER — Emergency Department (HOSPITAL_COMMUNITY): Payer: Medicare HMO

## 2019-05-31 ENCOUNTER — Telehealth: Payer: Self-pay | Admitting: Physician Assistant

## 2019-05-31 ENCOUNTER — Encounter (HOSPITAL_COMMUNITY): Payer: Self-pay | Admitting: Emergency Medicine

## 2019-05-31 DIAGNOSIS — Z5329 Procedure and treatment not carried out because of patient's decision for other reasons: Secondary | ICD-10-CM | POA: Insufficient documentation

## 2019-05-31 DIAGNOSIS — R1084 Generalized abdominal pain: Secondary | ICD-10-CM | POA: Diagnosis not present

## 2019-05-31 DIAGNOSIS — I10 Essential (primary) hypertension: Secondary | ICD-10-CM | POA: Insufficient documentation

## 2019-05-31 DIAGNOSIS — Z79899 Other long term (current) drug therapy: Secondary | ICD-10-CM | POA: Insufficient documentation

## 2019-05-31 DIAGNOSIS — R197 Diarrhea, unspecified: Secondary | ICD-10-CM | POA: Diagnosis not present

## 2019-05-31 DIAGNOSIS — R531 Weakness: Secondary | ICD-10-CM

## 2019-05-31 DIAGNOSIS — E039 Hypothyroidism, unspecified: Secondary | ICD-10-CM | POA: Diagnosis not present

## 2019-05-31 DIAGNOSIS — R52 Pain, unspecified: Secondary | ICD-10-CM | POA: Diagnosis not present

## 2019-05-31 NOTE — ED Provider Notes (Signed)
[facility] EMERGENCY DEPARTMENT Provider Note   CSN: [id number] Arrival date & time: 05/31/19  1044     History   Chief Complaint Chief Complaint  Patient presents with  . Weakness    HPI Susan Contreras is a 78 y.o. female.     HPI  Pt was seen at 1140.   Per pt, c/o gradual onset and persistence of constant generalized abd "pain" for the past 7 to 8 days.   Has been associated with multiple intermittent episodes of diarrhea and generalized weakness/fatigue.  Describes the abd pain as "cramping." Describes the diarrhea as "orange" and "watery."  Denies N/V, no fevers, no back pain, no rash, no CP/SOB, no black or blood in stools, no focal motor weakness, no tingling/numbness in extremities.      Past Medical History:  Diagnosis Date  . Anxiety   . Depression   . Diverticulosis   . Esophageal stricture   . Family hx of colorectal cancer   . GERD (gastroesophageal reflux disease)   . Helicobacter pylori (H. pylori) 1997   EGD  . Hemorrhoids   . Hiatal hernia   . Hx of adenomatous colonic polyps   . Hyperlipemia   . Hypertension   . PUD (peptic ulcer disease) 1997   EGD  . Thyroid disease   . Vertigo     Patient Active Problem List   Diagnosis Date Noted  . Benzodiazepine dependence ([place]) 05/19/2018  . Controlled substance agreement signed 05/19/2018  . Obesity (BMI 30.0-34.9) 11/26/2017  . Metabolic syndrome [id number]  . GAD (generalized anxiety disorder) 08/14/2015  . Allergic rhinitis 08/14/2015  . GERD (gastroesophageal reflux disease) 08/14/2015  . Hyperlipidemia 08/14/2015  . Vitamin D deficiency 08/14/2015  . Hypothyroidism 05/29/2014  . Cholelithiasis 01/15/2011  . ADENOMATOUS COLONIC POLYP 11/11/2007  . Essential hypertension 11/11/2007  . ESOPHAGEAL STRICTURE 11/11/2007  . Diaphragmatic hernia 11/11/2007  . DIVERTICULOSIS OF COLON 11/11/2007    Past Surgical History:  Procedure Laterality Date  . ABDOMINAL HYSTERECTOMY    . BILATERAL  OOPHORECTOMY    . CHOLECYSTECTOMY       OB History   No obstetric history on file.      Home Medications    Prior to Admission medications   Medication Sig Start Date End Date Taking? Authorizing Provider  ALPRAZolam (XANAX) 0.25 MG tablet Take 1 tablet (0.25 mg total) by mouth at bedtime as needed for anxiety. Patient taking differently: Take 0.25 mg by mouth 2 (two) times daily. Take 1/2 tablet by mouth twice daily. 12/28/18  Yes Hawks, Christy A, FNP  Chlorphen-PE-Acetaminophen (NOREL AD PO) Take 1 tablet by mouth every other day.   Yes [provider]  dexlansoprazole (DEXILANT) 60 MG capsule Take 1 capsule (60 mg total) by mouth daily. Take 1 capsule (60 mg total) by mouth daily. 05/03/19  Yes Esterwood, Amy S, PA-C  dicyclomine (BENTYL) 10 MG capsule Take 1 capsule (10 mg total) by mouth 2 (two) times daily. 05/03/19  Yes Esterwood, Amy S, PA-C  fluticasone (FLONASE) 50 MCG/ACT nasal spray Place 1 spray into both nostrils daily. 04/07/19  Yes Hawks, Christy A, FNP  irbesartan (AVAPRO) 75 MG tablet Take 1 tablet (75 mg total) by mouth daily. (Please make 6 mos med ckup) 12/28/18  Yes Hawks, Christy A, FNP  levothyroxine (SYNTHROID) 75 MCG tablet TAKE (1) TABLET DAILY BE- FORE BREAKFAST. 12/28/18  Yes Hawks, Christy A, FNP  meclizine (ANTIVERT) 25 MG tablet TAKE ONE TABLET BY MOUTH THREE TIMES DAILY AS  NEEDED FOR DIZZINESS 09/22/18  Yes Hawks, Christy A, FNP  cetirizine (ZYRTEC) 10 MG tablet Take 1 tablet (10 mg total) by mouth daily. Patient not taking: Reported on 05/31/2019 04/07/19   [name], FNP    Family History Family History  Problem Relation Age of Onset  . Colon cancer Sister   . Stroke Mother   . Heart disease Mother   . Parkinson's disease Father   . Ovarian cancer Sister   . Esophageal cancer Neg Hx   . Stomach cancer Neg Hx   . Kidney disease Neg Hx   . Liver disease Neg Hx   . Diabetes Neg Hx     Social History Social History   Tobacco Use  .  Smoking status: Never Smoker  . Smokeless tobacco: Never Used  Substance Use Topics  . Alcohol use: No  . Drug use: No     Allergies   Biaxin [clarithromycin], Cephalexin, Ciprofloxacin, Doxycycline, Flagyl [metronidazole hcl], Ketek [telithromycin], and Sulfa antibiotics   Review of Systems Review of Systems ROS: Statement: All systems negative except as marked or noted in the HPI; Constitutional: Negative for fever and chills. +generalized weakness/fatigue. ; ; Eyes: Negative for eye pain, redness and discharge. ; ; ENMT: Negative for ear pain, hoarseness, nasal congestion, sinus pressure and sore throat. ; ; Cardiovascular: Negative for chest pain, palpitations, diaphoresis, dyspnea and peripheral edema. ; ; Respiratory: Negative for cough, wheezing and stridor. ; ; Gastrointestinal: +diarrhea, abd pain. Negative for nausea, vomiting, blood in stool, hematemesis, jaundice and rectal bleeding. . ; ; Genitourinary: Negative for dysuria, flank pain and hematuria. ; ; Musculoskeletal: Negative for back pain and neck pain. Negative for swelling and trauma.; ; Skin: Negative for pruritus, rash, abrasions, blisters, bruising and skin lesion.; ; Neuro: Negative for headache, lightheadedness and neck stiffness. Negative for altered level of consciousness, altered mental status, extremity weakness, paresthesias, involuntary movement, seizure and syncope.       Physical Exam Updated Vital Signs BP (!) 135/58   Pulse 64   Temp 98.8 F (37.1 C) (Oral)   Resp 18   Ht 5\' 2"  (1.575 m)   Wt 77.1 kg   SpO2 96%   BMI 31.09 kg/m   Physical Exam 1145: Physical examination:  Nursing notes reviewed; Vital signs and O2 SAT reviewed;  Constitutional: Well developed, Well nourished, Well hydrated, In no acute distress; Head:  Normocephalic, atraumatic; Eyes: EOMI, PERRL, No scleral icterus; ENMT: Mouth and pharynx normal, Mucous membranes moist; Neck: Supple, Full range of motion, No lymphadenopathy;  Cardiovascular: Regular rate and rhythm, No gallop; Respiratory: Breath sounds clear & equal bilaterally, No wheezes.  Speaking full sentences with ease, Normal respiratory effort/excursion; Chest: Nontender, Movement normal; Abdomen: Soft, +mild diffuse tenderness to palp. No rebound or guarding. Nondistended, Normal bowel sounds; Genitourinary: No CVA tenderness; Extremities: Peripheral pulses normal, No tenderness, No edema, No calf edema or asymmetry.; Neuro: AA&Ox3, Major CN grossly intact. No facial droop. Speech clear. No gross focal motor or sensory deficits in extremities.; Skin: Color normal, Warm, Dry.    ED Treatments / Results  Labs (all labs ordered are listed, but only abnormal results are displayed)   EKG EKG Interpretation  Date/Time:  Tuesday May 31 2019 10:51:57 EDT Ventricular Rate:  68 PR Interval:    QRS Duration: 95 QT Interval:  421 QTC Calculation: 448 R Axis:   -40 Text Interpretation:  Sinus rhythm Left axis deviation Left anterior fascicular block Consider anterior infarct When compared with ECG  of 03/10/2018 No significant change was found Confirmed by Francine Graven [phone]) on 05/31/2019 12:20:28 PM   Radiology    Procedures Procedures (including critical care time)  Medications Ordered in ED Medications - No data to display   Initial Impression / Assessment and Plan / ED Course  I have reviewed the triage vital signs and the nursing notes.  Pertinent labs & imaging results that were available during my care of the patient were reviewed by me and considered in my medical decision making (see chart for details).     MDM Reviewed: previous chart, nursing note and vitals Reviewed previous: labs and ECG Interpretation: labs, ECG, x-ray and CT scan    1300:  Pt apparently told ED RN she was going to leave because "nothing is being done." Plan of care reiterated to pt. Pt stated it "was just her" and she wanted to sign out AMA.  ED RN  encouraged pt to stay, continues to refuse.  Pt makes her own medical decisions.  Risks of AMA explained to pt, including, but not limited to:  bowel infection, electrolyte abnormalities, stroke, heart attack, cardiac arrythmia ("irregular heart rate/beat"), "passing out," temporary and/or permanent disability, death.  Pt verb understanding and continues to refuse to stay in the ED for any further treatment, understanding the consequences of her decision.  Pt was encouraged to follow up with her PMD tomorrow and return to the ED immediately if symptoms return, or for any other concerns.      Final Clinical Impressions(s) / ED Diagnoses   Final diagnoses:  None    ED Discharge Orders    None       Francine Graven, DO 06/03/19 0800

## 2019-05-31 NOTE — ED Triage Notes (Signed)
Pt c/o generalized weakness x 1 week. Pt also reports bilateral upper quadrant pain and diarrhea just in the mornings for a week. Hx of IBS.

## 2019-05-31 NOTE — ED Notes (Signed)
Pt called out to nurses station. Pt states she wants to leave. She feels like nothing is being done for her. Pt stated, "I thought I would get fluids. This IV in my arm is hurting me. I have been stuck in this room for two hours. My husband was here in June and I do not like the way he was treated. Explained to pt that this RN and MD had already been in room and we were waiting on lab results, IV was placed, and she was scheduled to go to xray and CT. Pt stated, "it's not you, it's me." Pt signed out AMA. EDP notified.

## 2019-05-31 NOTE — Telephone Encounter (Signed)
Please ask pt to take her bentyl 10 mg every morning before breakfast , then again mid afternoon.  She may use he Xanax half tab twice daily   We can give her a course of Xifaxan 550 mg po BID x 2 weeks  For IBS /D

## 2019-06-01 NOTE — Telephone Encounter (Signed)
Spoke to patient. She will come by the office to pick up xifaxan samples She knows to take them twice daily for 2 weeks. She will take Xanax half tab twice daily and Bentyl 10 mg before breakfast and mid day. All questions answered patient voiced understanding.

## 2019-06-03 ENCOUNTER — Telehealth: Payer: Self-pay | Admitting: Physician Assistant

## 2019-06-03 NOTE — Telephone Encounter (Signed)
The patient did not come to get Xifaxan. Her diarrhea has resolved. She is complaining of fatigue and anxiety. She thinks the Bentyl contributes to this. She has been taking Bentyl in the mornings. She feel jittery and tired all morning. Then she begins to feel somewhat better in the afternoon. She reports "heartburn" and I"indigestion." She did not sleep well last night and in fact woke up with "a terrible headache."  Her headache has resolved. She took Tylenol and Xanax. Patient repeatedly says "I don't know what but something is up with me" but is not specific in her complaints.She declines to contact her PCP. She asks about the labs drawn at the ED visit recently. She left AMA. Labs appear to have been cancelled at that point.   She does agree to an appointment with GI for her nervous stomach and spells of indigestion.

## 2019-06-05 NOTE — Telephone Encounter (Signed)
Ok, I am happy tp see her ,but I would like her to see her pcp also, I think she is having significant anxiety issues

## 2019-06-13 ENCOUNTER — Ambulatory Visit: Payer: Medicare HMO | Admitting: Physician Assistant

## 2019-06-14 ENCOUNTER — Telehealth: Payer: Self-pay | Admitting: Physician Assistant

## 2019-06-14 NOTE — Telephone Encounter (Signed)
Pt calling again regarding this msg

## 2019-06-14 NOTE — Telephone Encounter (Signed)
Pt states that she is having problems with her stomach again, she wants to know what she can take for indigestion.

## 2019-06-14 NOTE — Telephone Encounter (Signed)
Patient wants to change back to Protonix (given 04/02/19) from the [place] (given 05/03/19). Her son told her to try Mylanta. She cancelled her appointment on 06/13/19 due to another appointment she had not anticipated. Advised she can take Mylanta for acid indigestion or reflux but not with her PPI. Encouraged her to keep her office visit that has been rescheduled.

## 2019-06-16 ENCOUNTER — Other Ambulatory Visit: Payer: Self-pay

## 2019-06-17 ENCOUNTER — Other Ambulatory Visit: Payer: Self-pay

## 2019-06-17 ENCOUNTER — Ambulatory Visit (INDEPENDENT_AMBULATORY_CARE_PROVIDER_SITE_OTHER): Payer: Medicare HMO | Admitting: Family

## 2019-06-17 ENCOUNTER — Encounter: Payer: Self-pay | Admitting: Family

## 2019-06-17 VITALS — BP 133/84 | HR 97 | Temp 96.8°F | Ht 62.0 in | Wt 174.6 lb

## 2019-06-17 DIAGNOSIS — Z20822 Contact with and (suspected) exposure to covid-19: Secondary | ICD-10-CM

## 2019-06-17 DIAGNOSIS — E785 Hyperlipidemia, unspecified: Secondary | ICD-10-CM

## 2019-06-17 DIAGNOSIS — I10 Essential (primary) hypertension: Secondary | ICD-10-CM

## 2019-06-17 DIAGNOSIS — Z79899 Other long term (current) drug therapy: Secondary | ICD-10-CM | POA: Diagnosis not present

## 2019-06-17 DIAGNOSIS — K2100 Gastro-esophageal reflux disease with esophagitis, without bleeding: Secondary | ICD-10-CM | POA: Diagnosis not present

## 2019-06-17 DIAGNOSIS — E669 Obesity, unspecified: Secondary | ICD-10-CM

## 2019-06-17 DIAGNOSIS — F411 Generalized anxiety disorder: Secondary | ICD-10-CM

## 2019-06-17 DIAGNOSIS — R0602 Shortness of breath: Secondary | ICD-10-CM

## 2019-06-17 DIAGNOSIS — R231 Pallor: Secondary | ICD-10-CM | POA: Diagnosis not present

## 2019-06-17 DIAGNOSIS — F1320 Sedative, hypnotic or anxiolytic dependence, uncomplicated: Secondary | ICD-10-CM

## 2019-06-17 DIAGNOSIS — E559 Vitamin D deficiency, unspecified: Secondary | ICD-10-CM

## 2019-06-17 DIAGNOSIS — E039 Hypothyroidism, unspecified: Secondary | ICD-10-CM | POA: Diagnosis not present

## 2019-06-17 LAB — HEMOGLOBIN, FINGERSTICK: Hemoglobin: 13.6 g/dL (ref 11.1–15.9)

## 2019-06-17 NOTE — Patient Instructions (Signed)

## 2019-06-17 NOTE — Progress Notes (Signed)
Subjective:    Patient ID: Susan Contreras, female    DOB: 1941-07-10, 78 y.o.   MRN: [id number]  Chief Complaint  Patient presents with  . Medical Management of Chronic Issues    Pt presents to the office today for chronic follow up. She states she has had diarrhea since 05/21/19 and having fatigue and SOB.   She has follow up appt with GI next week for diarrhea and GERD.  Hypertension This is a chronic problem. The current episode started more than 1 year ago. The problem has been waxing and waning since onset. The problem is controlled. Associated symptoms include anxiety, malaise/fatigue and shortness of breath. Pertinent negatives include no peripheral edema. Risk factors for coronary artery disease include dyslipidemia, obesity and sedentary lifestyle. The current treatment provides mild improvement. There is no history of kidney disease, CAD/MI or heart failure. Identifiable causes of hypertension include a thyroid problem.  Gastroesophageal Reflux She complains of belching, heartburn and a hoarse voice. This is a chronic problem. The current episode started more than 1 year ago. The problem occurs occasionally. Associated symptoms include fatigue. She has tried a PPI for the symptoms. The treatment provided significant relief.  Diarrhea  This is a new problem. The current episode started 1 to 4 weeks ago. The problem occurs less than 2 times per day.  Thyroid Problem Presents for follow-up visit. Symptoms include anxiety, depressed mood, diarrhea, fatigue and hoarse voice. Patient reports no constipation. The symptoms have been stable. Her past medical history is significant for hyperlipidemia. There is no history of heart failure.  Hyperlipidemia This is a chronic problem. The current episode started more than 1 year ago. Associated symptoms include shortness of breath.  Anxiety Presents for follow-up visit. Symptoms include depressed mood, excessive worry, irritability,  nervous/anxious behavior and shortness of breath. Patient reports no decreased concentration or panic. Symptoms occur most days. The severity of symptoms is moderate.        Review of Systems  Constitutional: Positive for fatigue, irritability and malaise/fatigue.  HENT: Positive for hoarse voice.   Respiratory: Positive for shortness of breath.   Gastrointestinal: Positive for diarrhea and heartburn. Negative for constipation.  Psychiatric/Behavioral: Negative for decreased concentration. The patient is nervous/anxious.   All other systems reviewed and are negative.      Objective:   Physical Exam Vitals signs reviewed.  Constitutional:      General: She is not in acute distress.    Appearance: She is well-developed.  HENT:     Head: Normocephalic and atraumatic.     Right Ear: Tympanic membrane normal.     Left Ear: Tympanic membrane normal.  Eyes:     Pupils: Pupils are equal, round, and reactive to light.  Neck:     Musculoskeletal: Normal range of motion and neck supple.     Thyroid: No thyromegaly.  Cardiovascular:     Rate and Rhythm: Normal rate and regular rhythm.     Heart sounds: Normal heart sounds. No murmur.  Pulmonary:     Effort: Pulmonary effort is normal. No respiratory distress.     Breath sounds: Normal breath sounds. No wheezing.  Abdominal:     General: Bowel sounds are normal. There is no distension.     Palpations: Abdomen is soft.     Tenderness: There is no abdominal tenderness.  Musculoskeletal: Normal range of motion.        General: No tenderness.  Skin:    General: Skin is warm and dry.  Coloration: Skin is pale.  Neurological:     Mental Status: She is alert and oriented to person, place, and time.     Cranial Nerves: No cranial nerve deficit.     Deep Tendon Reflexes: Reflexes are normal and symmetric.  Psychiatric:        Behavior: Behavior normal.        Thought Content: Thought content normal.        Judgment: Judgment  normal.       BP (!) 147/100   Pulse 74   Temp (!) 96.8 F (36 C) (Temporal)   Ht 5' 2"  (1.575 m)   Wt 174 lb 9.6 oz (79.2 kg)   SpO2 98%   BMI 31.93 kg/m      Assessment & Plan:  Susan Contreras comes in today with chief complaint of Medical Management of Chronic Issues   Diagnosis and orders addressed:  1. Essential hypertension - CMP14+EGFR - Ambulatory referral to Cardiology  2. Gastroesophageal reflux disease with esophagitis, unspecified whether hemorrhage - CMP14+EGFR  3. Hypothyroidism, unspecified type - CMP14+EGFR - TSH  4. Vitamin D deficiency - CMP14+EGFR  5. Obesity (BMI 30.0-34.9) - CMP14+EGFR  6. Hyperlipidemia, unspecified hyperlipidemia type - CMP14+EGFR - Lipid panel  7. Benzodiazepine dependence ([facility]) - CMP14+EGFR  8. Controlled substance agreement signed - CMP14+EGFR  9. GAD (generalized anxiety disorder) - CMP14+EGFR  10. SOB (shortness of breath) - Hemoglobin, fingerstick - CMP14+EGFR - Anemia Profile B - EKG 12-Lead - Ambulatory referral to Cardiology  11. Pale - Hemoglobin, fingerstick - CMP14+EGFR - Anemia Profile B  Pt reviewed in [place] controlled database- No red flags noted, contract and urine drug UTD Labs pending Health Maintenance reviewed Diet and exercise encouraged  Follow up plan: 3 months    [name], FNP

## 2019-06-18 ENCOUNTER — Telehealth: Payer: Self-pay | Admitting: Gastroenterology

## 2019-06-18 LAB — ANEMIA PROFILE B
Basophils Absolute: 0.1 10*3/uL (ref 0.0–0.2)
Basos: 1 %
EOS (ABSOLUTE): 0.3 10*3/uL (ref 0.0–0.4)
Eos: 4 %
Ferritin: 46 ng/mL (ref 15–150)
Folate: 9.9 ng/mL (ref >3.0)
Hematocrit: 41 % (ref 34.0–46.6)
Hemoglobin: 13.6 g/dL (ref 11.1–15.9)
Immature Grans (Abs): 0 10*3/uL (ref 0.0–0.1)
Immature Granulocytes: 0 %
Iron Saturation: 22 % (ref 15–55)
Iron: 68 ug/dL (ref 27–139)
Lymphocytes Absolute: 2.6 10*3/uL (ref 0.7–3.1)
Lymphs: 31 %
MCH: 28.9 pg (ref 26.6–33.0)
MCHC: 33.2 g/dL (ref 31.5–35.7)
MCV: 87 fL (ref 79–97)
Monocytes Absolute: 0.6 10*3/uL (ref 0.1–0.9)
Monocytes: 7 %
Neutrophils Absolute: 4.6 10*3/uL (ref 1.4–7.0)
Neutrophils: 57 %
Platelets: 295 10*3/uL (ref 150–450)
RBC: 4.7 x10E6/uL (ref 3.77–5.28)
RDW: 13.3 % (ref 11.7–15.4)
Retic Ct Pct: 1.3 % (ref 0.6–2.6)
Total Iron Binding Capacity: 312 ug/dL (ref 250–450)
UIBC: 244 ug/dL (ref 118–369)
Vitamin B-12: 261 pg/mL (ref 232–1245)
WBC: 8.2 10*3/uL (ref 3.4–10.8)

## 2019-06-18 LAB — CMP14+EGFR
ALT: 7 IU/L (ref 0–32)
AST: 18 IU/L (ref 0–40)
Albumin/Globulin Ratio: 1.3 (ref 1.2–2.2)
Albumin: 4.5 g/dL (ref 3.7–4.7)
Alkaline Phosphatase: 96 IU/L (ref 39–117)
BUN/Creatinine Ratio: 9 — ABNORMAL LOW (ref 12–28)
BUN: 12 mg/dL (ref 8–27)
Bilirubin Total: 0.6 mg/dL (ref 0.0–1.2)
CO2: 19 mmol/L — ABNORMAL LOW (ref 20–29)
Calcium: 9.5 mg/dL (ref 8.7–10.3)
Chloride: 100 mmol/L (ref 96–106)
Creatinine, Ser: 1.36 mg/dL — ABNORMAL HIGH (ref 0.57–1.00)
GFR calc Af Amer: 44 mL/min/{1.73_m2} — ABNORMAL LOW (ref >59)
GFR calc non Af Amer: 38 mL/min/{1.73_m2} — ABNORMAL LOW (ref >59)
Globulin, Total: 3.4 g/dL (ref 1.5–4.5)
Glucose: 88 mg/dL (ref 65–99)
Potassium: 4.5 mmol/L (ref 3.5–5.2)
Sodium: 135 mmol/L (ref 134–144)
Total Protein: 7.9 g/dL (ref 6.0–8.5)

## 2019-06-18 LAB — LIPID PANEL
Chol/HDL Ratio: 5.4 ratio — ABNORMAL HIGH (ref 0.0–4.4)
Cholesterol, Total: 265 mg/dL — ABNORMAL HIGH (ref 100–199)
HDL: 49 mg/dL (ref >39)
LDL Chol Calc (NIH): 164 mg/dL — ABNORMAL HIGH (ref 0–99)
Triglycerides: 280 mg/dL — ABNORMAL HIGH (ref 0–149)
VLDL Cholesterol Cal: 52 mg/dL — ABNORMAL HIGH (ref 5–40)

## 2019-06-18 LAB — TSH: TSH: 2.19 u[IU]/mL (ref 0.450–4.500)

## 2019-06-18 NOTE — Telephone Encounter (Signed)
She called with fatigue, intermittent loose stools, intermittent indigestion, wobbly knees, trouble swallowing at times.  She was seen at her PCP yesterday for these issues.  Labs show normal Cbc, her usual CMET, normal TSH.  She is already scheduled to see AE on this Wednesday and I recommended she keep that appointment.  Clearly does not need to go to the ER.  I am not sure what to make over her myriad upper and lower GI issues.

## 2019-06-19 LAB — NOVEL CORONAVIRUS, NAA: SARS-CoV-2, NAA: NOT DETECTED

## 2019-06-21 ENCOUNTER — Telehealth: Payer: Self-pay | Admitting: Family

## 2019-06-21 ENCOUNTER — Telehealth: Payer: Self-pay | Admitting: Physician Assistant

## 2019-06-21 NOTE — Telephone Encounter (Signed)
Patient aware not been reviewed yet

## 2019-06-21 NOTE — Telephone Encounter (Signed)
Pt calling back regarding this msg

## 2019-06-21 NOTE — Telephone Encounter (Signed)
Pt is scheduled for PV 06/02/19 but stated that she has not received results of COVID-19 test that she had taken on 06/17/19.

## 2019-06-21 NOTE — Telephone Encounter (Signed)
Patient had been tested for COVID on the suggestion of her PCP. She was concerned that she should not keep the appointment with GI because she did not know the results. She states she is now aware the results are negative. She will keep her office visit tomorrow with GI.

## 2019-06-22 ENCOUNTER — Other Ambulatory Visit (INDEPENDENT_AMBULATORY_CARE_PROVIDER_SITE_OTHER): Payer: Medicare HMO

## 2019-06-22 ENCOUNTER — Other Ambulatory Visit: Payer: Self-pay

## 2019-06-22 ENCOUNTER — Ambulatory Visit (INDEPENDENT_AMBULATORY_CARE_PROVIDER_SITE_OTHER)
Admission: RE | Admit: 2019-06-22 | Discharge: 2019-06-22 | Disposition: A | Discharge status: Home / Self Care | Payer: Medicare HMO | Source: Ambulatory Visit | Attending: Physician Assistant | Admitting: Physician Assistant

## 2019-06-22 ENCOUNTER — Ambulatory Visit: Payer: Medicare HMO | Admitting: Physician Assistant

## 2019-06-22 ENCOUNTER — Encounter: Payer: Self-pay | Admitting: Physician Assistant

## 2019-06-22 VITALS — BP 138/76 | HR 92 | Temp 99.1°F | Ht 61.5 in | Wt 175.2 lb

## 2019-06-22 DIAGNOSIS — R531 Weakness: Secondary | ICD-10-CM | POA: Diagnosis not present

## 2019-06-22 DIAGNOSIS — K589 Irritable bowel syndrome without diarrhea: Secondary | ICD-10-CM

## 2019-06-22 DIAGNOSIS — R1010 Upper abdominal pain, unspecified: Secondary | ICD-10-CM

## 2019-06-22 DIAGNOSIS — R0602 Shortness of breath: Secondary | ICD-10-CM | POA: Diagnosis not present

## 2019-06-22 DIAGNOSIS — R5383 Other fatigue: Secondary | ICD-10-CM

## 2019-06-22 DIAGNOSIS — R141 Gas pain: Secondary | ICD-10-CM

## 2019-06-22 LAB — SEDIMENTATION RATE: Sed Rate: 30 mm/hr (ref 0–30)

## 2019-06-22 NOTE — Progress Notes (Signed)
Subjective:    Patient ID: Susan Contreras, female    DOB: 04-28-1941, 78 y.o.   MRN: [id number]  HPI [name] is a 78 year old white female, known to Dr. Henrene Pastor and myself.  She was last seen in our office in September with IBS symptoms.  She also has history of chronic GERD, generalized anxiety disorder, diverticulosis, and adenomatous colon polyps. At time of last visit she was given a low gas diet and a trial of Bentyl and continued on Dexilant. She says she was unable to tolerate the Bentyl and felt worse immediately after trying that.  She feels this gave her diarrhea.  She continues to have problems with alternating loose stools and constipation -currently more on the constipated side.  She says she feels fatigued and weak and has continued to feel that way over the past 4 to 5 weeks.  She has no appetite though she is forcing herself to eat.  No nausea or vomiting, no fever.  She continues to complain of some intermittent upper abdominal discomfort and generalized gassiness with both flatulence and belching though no complaints of bloating or distention.  She complains of a feeling of "nervousness in her abdomen".  She does not feel that anxiety is driving her symptoms as she has been taking her Xanax as prescribed. She went to the emergency room on 05/31/2019 with complaints of abdominal discomfort and weakness but left AMA. Seen by her PCP on 06/17/2019, baseline labs were done and unremarkable other than creatinine of 1.3.  Hemoglobin was 13.6, iron studies B12 and folate all normal.  She was also Covid tested which was negative. Patient says she feels terrible and wants further testing.  In the interim since her last office visit she had called a few times.  We had wanted her to take a course of Xifaxan for probable SIBO, but she never came by to pick up the samples.  Review of Systems Pertinent positive and negative review of systems were noted in the above HPI section.  All other review of  systems was otherwise negative.  Outpatient Encounter Medications as of 06/22/2019  Medication Sig  . ALPRAZolam (XANAX) 0.25 MG tablet Take 1 tablet (0.25 mg total) by mouth at bedtime as needed for anxiety. (Patient taking differently: Take 0.25 mg by mouth 2 (two) times daily. Take 1/2 tablet by mouth twice daily.)  . cetirizine (ZYRTEC) 10 MG tablet Take 1 tablet (10 mg total) by mouth daily.  . Chlorphen-PE-Acetaminophen (NOREL AD PO) Take 1 tablet by mouth every other day.  Marland Kitchen dexlansoprazole (DEXILANT) 60 MG capsule Take 1 capsule (60 mg total) by mouth daily. Take 1 capsule (60 mg total) by mouth daily.  . fluticasone (FLONASE) 50 MCG/ACT nasal spray Place 1 spray into both nostrils daily.  . irbesartan (AVAPRO) 75 MG tablet Take 1 tablet (75 mg total) by mouth daily. (Please make 6 mos med ckup)  . levothyroxine (SYNTHROID) 75 MCG tablet TAKE (1) TABLET DAILY BE- FORE BREAKFAST.  Marland Kitchen meclizine (ANTIVERT) 25 MG tablet TAKE ONE TABLET BY MOUTH THREE TIMES DAILY AS NEEDED FOR DIZZINESS  . [DISCONTINUED] dicyclomine (BENTYL) 10 MG capsule Take 1 capsule (10 mg total) by mouth 2 (two) times daily.   No facility-administered encounter medications on file as of 06/22/2019.    Allergies  Allergen Reactions  . Biaxin [Clarithromycin]   . Cephalexin   . Ciprofloxacin   . Doxycycline     Headaches  . Flagyl [Metronidazole Hcl]   . Ketek [Telithromycin]   .  Sulfa Antibiotics    Patient Active Problem List   Diagnosis Date Noted  . Benzodiazepine dependence ([place]) 05/19/2018  . Controlled substance agreement signed 05/19/2018  . Obesity (BMI 30.0-34.9) 11/26/2017  . Metabolic syndrome [id number]  . GAD (generalized anxiety disorder) 08/14/2015  . Allergic rhinitis 08/14/2015  . GERD (gastroesophageal reflux disease) 08/14/2015  . Hyperlipidemia 08/14/2015  . Vitamin D deficiency 08/14/2015  . Hypothyroidism 05/29/2014  . Cholelithiasis 01/15/2011  . ADENOMATOUS COLONIC POLYP  11/11/2007  . Essential hypertension 11/11/2007  . ESOPHAGEAL STRICTURE 11/11/2007  . Diaphragmatic hernia 11/11/2007  . DIVERTICULOSIS OF COLON 11/11/2007   Social History   Socioeconomic History  . Marital status: Married    Spouse name: Not on file  . Number of children: 2  . Years of education: Not on file  . Highest education level: Not on file  Occupational History  . Occupation: Retired  Scientific laboratory technician  . Financial resource strain: Not on file  . Food insecurity    Worry: Not on file    Inability: Not on file  . Transportation needs    Medical: Not on file    Non-medical: Not on file  Tobacco Use  . Smoking status: Never Smoker  . Smokeless tobacco: Never Used  Substance and Sexual Activity  . Alcohol use: No  . Drug use: No  . Sexual activity: Not on file  Lifestyle  . Physical activity    Days per week: Not on file    Minutes per session: Not on file  . Stress: Not on file  Relationships  . Social Herbalist on phone: Not on file    Gets together: Not on file    Attends religious service: Not on file    Active member of club or organization: Not on file    Attends meetings of clubs or organizations: Not on file    Relationship status: Not on file  . Intimate partner violence    Fear of current or ex partner: Not on file    Emotionally abused: Not on file    Physically abused: Not on file    Forced sexual activity: Not on file  Other Topics Concern  . Not on file  Social History Narrative   2 caffeine drinks daily     Ms. Pilkenton's family history includes Colon cancer in her sister; Heart disease in her mother; Ovarian cancer in her sister; Parkinson's disease in her father; Stroke in her mother.      Objective:    Vitals:   06/22/19 1121  BP: 138/76  Pulse: 92  Temp: 99.1 F (37.3 C)    Physical Exam Well-developed well-nourished elderly white female in no acute distress.  Height, Weight, 175 BMI 32.5  HEENT; nontraumatic  normocephalic, EOMI, PER R LA, sclera anicteric. Oropharynx; not examined/mask/Covid Neck; supple, no JVD Cardiovascular; regular rate and rhythm with S1-S2, no murmur rub or gallop Pulmonary; Clear bilaterally Abdomen; soft, no localized tenderness, nondistended, no palpable mass or hepatosplenomegaly, bowel sounds are active Rectal; not done Skin; benign exam, no jaundice rash or appreciable lesions Extremities; no clubbing cyanosis or edema skin warm and dry Neuro/Psych; alert and oriented x4, grossly nonfocal mood and affect appropriate       Assessment & Plan:   #85 78 year old white female with history of chronic GERD, diverticulosis, IBS with alternating diarrhea and constipation and history of adenomatous colon polyps. Patient with multiple symptoms currently, primarily concerned about weakness and fatigue which has been persistent,  ongoing gassiness and vague abdominal discomfort. Patient has history of chronic anxiety and I still suspect at least a component of her symptoms are secondary to underlying anxiety, and IBS.  She has declined endoscopic evaluation. Rule out SIBO. Rule out underlying malignancy, rule out underlying autoimmune disease with persistent weakness and fatigue  Plan; CT of the abdomen and pelvis with contrast, and chest x-ray. ANA, sed rate, vitamin D level, Patient was given an entire course of Xifaxan today/samples and asked to complete 550 mg p.o. 3 times daily x14 days. Continue low gas diet. Continue Dexilant 60 mg p.o. every morning, I do not think she needs twice daily dosing, and suggested she take Mylanta for immediately relief if she has any indigestion.   Amy Genia Harold PA-C 06/22/2019   Cc: [name], FNP

## 2019-06-22 NOTE — Patient Instructions (Signed)
Your provider has requested that you have an chest x ray before leaving today. Please go to the basement floor to our Radiology department for the test.   You are scheduled on 07/01/2019 at 1:30pm. You should arrive 15 minutes prior to your appointment time for registration. Please follow the written instructions below on the day of your exam:    [facility]  1) Do not eat or drink anything after 9:30am (4 hours prior to your test) 2) You have been given 2 bottles of oral contrast to drink. The solution may taste better if refrigerated, but do NOT add ice or any other liquid to this solution. Shake well before drinking.    Drink 1 bottle of contrast @ 11:30am (2 hours prior to your exam)  Drink 1 bottle of contrast @ 12:30pm (1 hour prior to your exam)  You may take any medications as prescribed with a small amount of water, if necessary. If you take any of the following medications: METFORMIN, GLUCOPHAGE, GLUCOVANCE, AVANDAMET, RIOMET, FORTAMET, ACTOPLUS MET, JANUMET, GLUMETZA or METAGLIP, you MAY be asked to HOLD this medication 48 hours AFTER the exam.  T This test typically takes 30-45 minutes to complete.  Continue Dexilant 60 mg daily  Use Plain Mylanta 30 cc as needed  We have given you Xifaxian samples to take three times a day for 14 days, then discontinue   Go to the basement for labs today  If you are age 87 or older, your body mass index should be between 23-30. Your Body mass index is 32.58 kg/m. If this is out of the aforementioned range listed, please consider follow up with your Primary Care Provider.  If you are age 49 or younger, your body mass index should be between 19-25. Your Body mass index is 32.58 kg/m. If this is out of the aformentioned range listed, please consider follow up with your Primary Care Provider.    I appreciate the  opportunity to care for you  Thank You   Amy  Esterwood,PA-C    ________________________________________________________________________

## 2019-06-23 ENCOUNTER — Telehealth: Payer: Self-pay | Admitting: Physician Assistant

## 2019-06-23 ENCOUNTER — Other Ambulatory Visit: Payer: Self-pay | Admitting: Family

## 2019-06-23 LAB — ANA: Anti Nuclear Antibody (ANA): NEGATIVE

## 2019-06-23 MED ORDER — ATORVASTATIN CALCIUM 20 MG PO TABS: 20.0000 mg | ORAL_TABLET | Freq: Every day | ORAL | 3 refills | Status: DC

## 2019-06-23 NOTE — Telephone Encounter (Signed)
Calling on labs from 10/23- please review and advise

## 2019-06-23 NOTE — Telephone Encounter (Signed)
Pt inquired about results of CT and lab tests.

## 2019-06-23 NOTE — Telephone Encounter (Signed)
Advised her CXR is okay. Her labs are not back yet. She tells me she has been referred to cardiology by her PCP. She doesn't remember the conversation that lead to this referral. She says she was unaware of being referred. She may cancel the CT though if her PCP feels the referral to cardiology is needed. Reassured the patient that I will help her cancel or reschedule the CT if she wants to. Encouraged her to follow through with her cariology referral if she has "felt bad" and "been so short of breath" suggesting these things were probably discussed at her PCP visit. Patient admits to her "anxiety and stress."

## 2019-06-23 NOTE — Telephone Encounter (Signed)
Pt calling again for lab results

## 2019-06-23 NOTE — Telephone Encounter (Signed)
See result note.  

## 2019-06-23 NOTE — Progress Notes (Signed)
Assessment and plan reviewed 

## 2019-06-24 ENCOUNTER — Ambulatory Visit: Payer: Medicare HMO | Admitting: Family

## 2019-06-24 ENCOUNTER — Ambulatory Visit: Payer: Medicare HMO | Admitting: Cardiology

## 2019-06-24 ENCOUNTER — Other Ambulatory Visit: Payer: Self-pay

## 2019-06-24 ENCOUNTER — Telehealth: Payer: Self-pay | Admitting: Family

## 2019-06-24 ENCOUNTER — Other Ambulatory Visit: Payer: Self-pay | Admitting: *Deleted

## 2019-06-24 LAB — VITAMIN D 25 HYDROXY (VIT D DEFICIENCY, FRACTURES): VITD: 24.07 ng/mL — ABNORMAL LOW (ref 30.00–100.00)

## 2019-06-24 MED ORDER — VITAMIN D (ERGOCALCIFEROL) 1.25 MG (50000 UNIT) PO CAPS: 50000.0000 [IU] | ORAL_CAPSULE | ORAL | 0 refills | Status: AC

## 2019-06-24 NOTE — Telephone Encounter (Signed)
Aware.  Needs to see cardiology due to complaints of weakness, short of breath and feeling hot or flushed at times.  EKG had subtle changes.   Reviewed all lab results in detail.

## 2019-06-24 NOTE — Telephone Encounter (Signed)
Patient requests to cancel her CT abd/pelvis. She is going to see the Cardiologist next week. "It will just be too much in 1 week."  "I am not sure I want to do the CT." Cancelled per her request. Encouraged her to call back if she changes her mind.

## 2019-06-24 NOTE — Telephone Encounter (Signed)
Please review labs and advise.

## 2019-06-24 NOTE — Telephone Encounter (Signed)
Pt called back and would like to discuss scheduled CT.

## 2019-06-26 ENCOUNTER — Emergency Department (HOSPITAL_COMMUNITY): Payer: Medicare HMO

## 2019-06-26 ENCOUNTER — Emergency Department (HOSPITAL_COMMUNITY)
Admission: EM | Admit: 2019-06-26 | Discharge: 2019-06-26 | Disposition: A | Discharge status: Home / Self Care | Payer: Medicare HMO | Attending: Emergency Medicine | Admitting: Emergency Medicine

## 2019-06-26 ENCOUNTER — Encounter (HOSPITAL_COMMUNITY): Payer: Self-pay | Admitting: Emergency Medicine

## 2019-06-26 ENCOUNTER — Other Ambulatory Visit: Payer: Self-pay

## 2019-06-26 DIAGNOSIS — R0789 Other chest pain: Secondary | ICD-10-CM | POA: Insufficient documentation

## 2019-06-26 DIAGNOSIS — R1310 Dysphagia, unspecified: Secondary | ICD-10-CM | POA: Diagnosis not present

## 2019-06-26 DIAGNOSIS — R519 Headache, unspecified: Secondary | ICD-10-CM | POA: Diagnosis not present

## 2019-06-26 DIAGNOSIS — E039 Hypothyroidism, unspecified: Secondary | ICD-10-CM | POA: Diagnosis not present

## 2019-06-26 DIAGNOSIS — I10 Essential (primary) hypertension: Secondary | ICD-10-CM | POA: Diagnosis not present

## 2019-06-26 DIAGNOSIS — R0602 Shortness of breath: Secondary | ICD-10-CM | POA: Diagnosis not present

## 2019-06-26 DIAGNOSIS — Z79899 Other long term (current) drug therapy: Secondary | ICD-10-CM | POA: Insufficient documentation

## 2019-06-26 DIAGNOSIS — K2960 Other gastritis without bleeding: Secondary | ICD-10-CM

## 2019-06-26 DIAGNOSIS — R079 Chest pain, unspecified: Secondary | ICD-10-CM | POA: Diagnosis not present

## 2019-06-26 DIAGNOSIS — R05 Cough: Secondary | ICD-10-CM | POA: Insufficient documentation

## 2019-06-26 DIAGNOSIS — K219 Gastro-esophageal reflux disease without esophagitis: Secondary | ICD-10-CM | POA: Diagnosis not present

## 2019-06-26 LAB — CBC
HCT: 41.9 % (ref 36.0–46.0)
Hemoglobin: 13.6 g/dL (ref 12.0–15.0)
MCH: 29.3 pg (ref 26.0–34.0)
MCHC: 32.5 g/dL (ref 30.0–36.0)
MCV: 90.3 fL (ref 80.0–100.0)
Platelets: 281 10*3/uL (ref 150–400)
RBC: 4.64 MIL/uL (ref 3.87–5.11)
RDW: 13.2 % (ref 11.5–15.5)
WBC: 9.3 10*3/uL (ref 4.0–10.5)
nRBC: 0 % (ref 0.0–0.2)

## 2019-06-26 LAB — BASIC METABOLIC PANEL
Anion gap: 11 (ref 5–15)
BUN: 7 mg/dL — ABNORMAL LOW (ref 8–23)
CO2: 22 mmol/L (ref 22–32)
Calcium: 9.5 mg/dL (ref 8.9–10.3)
Chloride: 102 mmol/L (ref 98–111)
Creatinine, Ser: 1.42 mg/dL — ABNORMAL HIGH (ref 0.44–1.00)
GFR calc Af Amer: 41 mL/min — ABNORMAL LOW (ref >60)
GFR calc non Af Amer: 36 mL/min — ABNORMAL LOW (ref >60)
Glucose, Bld: 100 mg/dL — ABNORMAL HIGH (ref 70–99)
Potassium: 4.1 mmol/L (ref 3.5–5.1)
Sodium: 135 mmol/L (ref 135–145)

## 2019-06-26 LAB — TROPONIN I (HIGH SENSITIVITY): Troponin I (High Sensitivity): 5 ng/L (ref <18)

## 2019-06-26 MED ORDER — ALUM & MAG HYDROXIDE-SIMETH 200-200-20 MG/5ML PO SUSP
30.0000 mL | Freq: Once | ORAL | Status: AC
  Administered 2019-06-26: 30 mL via ORAL
  Filled 2019-06-26: qty 30

## 2019-06-26 MED ORDER — LIDOCAINE VISCOUS HCL 2 % MT SOLN
15.0000 mL | Freq: Once | OROMUCOSAL | Status: AC
  Administered 2019-06-26: 15 mL via ORAL
  Filled 2019-06-26: qty 15

## 2019-06-26 MED ORDER — SODIUM CHLORIDE 0.9% FLUSH: 3.0000 mL | Freq: Once | INTRAVENOUS | Status: DC

## 2019-06-26 NOTE — ED Triage Notes (Signed)
C/o generalized pain across chest and reports "stuffiness" to chest and SOB x 1 month.  Reports feeling sleepy and generalized weakness.  Pt belching during triage.  Denies nausea, vomiting, and diarrhea.

## 2019-06-26 NOTE — ED Provider Notes (Signed)
[facility] EMERGENCY DEPARTMENT Provider Note   CSN: [id number] Arrival date & time: 06/26/19  1224     History   Chief Complaint Chief Complaint  Patient presents with  . Shortness of Breath  . Chest Pain    HPI Susan Contreras is a 78 y.o. female with PMH significant for HTN, CKD, HLD, PUD, and anxiety presents to the ED with reports of squeezing chest pain beginning at 8 AM this morning in addition to reflux-like burning and eructation.  She states that she is concerned for heart attack or stroke, given her age.  She reports having felt "crummy" and fatigued for approximately 1 month and she tested negative for COVID-19 last week.  She saw her gastroenterologist 2 weeks ago who has been adjusting her GERD medications, as needed.  She also saw her PCP, Sherre Scarlet FNP, last week regarding her ongoing fatigue symptoms.  She reports mild, intermittent headaches, chest tightness with cough occasionally productive of clear sputum, intermittent dysphagia, reflux and chest burning symptoms, intermittent nausea with no clear aggravators, and right sided foot pain that comes and goes.  She denies any fevers or chills, room spinning dizziness, difficulty breathing, new or unusual abdominal discomfort, urinary symptoms, changes in bowel habits, blurred vision, numbness, tingling, or any other neurologic deficits.     HPI  Past Medical History:  Diagnosis Date  . Anxiety   . Depression   . Diverticulosis   . Esophageal stricture   . Family hx of colorectal cancer   . GERD (gastroesophageal reflux disease)   . Helicobacter pylori (H. pylori) 1997   EGD  . Hemorrhoids   . Hiatal hernia   . Hx of adenomatous colonic polyps   . Hyperlipemia   . Hypertension   . PUD (peptic ulcer disease) 1997   EGD  . Thyroid disease   . Vertigo     Patient Active Problem List   Diagnosis Date Noted  . Benzodiazepine dependence ([place]) 05/19/2018  . Controlled substance agreement  signed 05/19/2018  . Obesity (BMI 30.0-34.9) 11/26/2017  . Metabolic syndrome [id number]  . GAD (generalized anxiety disorder) 08/14/2015  . Allergic rhinitis 08/14/2015  . GERD (gastroesophageal reflux disease) 08/14/2015  . Hyperlipidemia 08/14/2015  . Vitamin D deficiency 08/14/2015  . Hypothyroidism 05/29/2014  . Cholelithiasis 01/15/2011  . ADENOMATOUS COLONIC POLYP 11/11/2007  . Essential hypertension 11/11/2007  . ESOPHAGEAL STRICTURE 11/11/2007  . Diaphragmatic hernia 11/11/2007  . DIVERTICULOSIS OF COLON 11/11/2007    Past Surgical History:  Procedure Laterality Date  . ABDOMINAL HYSTERECTOMY    . BILATERAL OOPHORECTOMY    . CHOLECYSTECTOMY       OB History   No obstetric history on file.      Home Medications    Prior to Admission medications   Medication Sig Start Date End Date Taking? Authorizing Provider  ALPRAZolam (XANAX) 0.25 MG tablet Take 1 tablet (0.25 mg total) by mouth at bedtime as needed for anxiety. Patient taking differently: Take 0.25 mg by mouth 2 (two) times daily. Take 1/2 tablet by mouth twice daily. 12/28/18   [name] A, FNP  atorvastatin (LIPITOR) 20 MG tablet Take 1 tablet (20 mg total) by mouth daily. 06/23/19   [name], FNP  cetirizine (ZYRTEC) 10 MG tablet Take 1 tablet (10 mg total) by mouth daily. 04/07/19   Hawks, Alyse Low A, FNP  Chlorphen-PE-Acetaminophen (NOREL AD PO) Take 1 tablet by mouth every other day.    [provider]  dexlansoprazole Ross Marcus)  60 MG capsule Take 1 capsule (60 mg total) by mouth daily. Take 1 capsule (60 mg total) by mouth daily. 05/03/19   Esterwood, Amy S, PA-C  fluticasone (FLONASE) 50 MCG/ACT nasal spray Place 1 spray into both nostrils daily. 04/07/19   [name] A, FNP  irbesartan (AVAPRO) 75 MG tablet Take 1 tablet (75 mg total) by mouth daily. (Please make 6 mos med ckup) 12/28/18   [name], FNP  levothyroxine (SYNTHROID) 75 MCG tablet TAKE (1) TABLET DAILY BE- FORE  BREAKFAST. 12/28/18   [name] A, FNP  meclizine (ANTIVERT) 25 MG tablet TAKE ONE TABLET BY MOUTH THREE TIMES DAILY AS NEEDED FOR DIZZINESS 09/22/18   [name] A, FNP  Vitamin D, Ergocalciferol, (DRISDOL) 1.25 MG (50000 UT) CAPS capsule Take 1 capsule (50,000 Units total) by mouth every 7 (seven) days for 8 doses. Then begin Vitamin D 400 IU every day 06/24/19 08/13/19  Alfredia Ferguson, PA-C    Family History Family History  Problem Relation Age of Onset  . Colon cancer Sister   . Stroke Mother   . Heart disease Mother   . Parkinson's disease Father   . Ovarian cancer Sister   . Esophageal cancer Neg Hx   . Stomach cancer Neg Hx   . Kidney disease Neg Hx   . Liver disease Neg Hx   . Diabetes Neg Hx     Social History Social History   Tobacco Use  . Smoking status: Never Smoker  . Smokeless tobacco: Never Used  Substance Use Topics  . Alcohol use: No  . Drug use: No     Allergies   Biaxin [clarithromycin], Cephalexin, Ciprofloxacin, Doxycycline, Flagyl [metronidazole hcl], Ketek [telithromycin], and Sulfa antibiotics   Review of Systems Review of Systems  All other systems reviewed and are negative.    Physical Exam Updated Vital Signs BP 133/61 (BP Location: Right Arm)   Pulse 83   Temp (!) 97.4 F (36.3 C) (Oral)   Resp 20   SpO2 99%   Physical Exam Vitals signs and nursing note reviewed. Exam conducted with a chaperone present.  Constitutional:      Appearance: Normal appearance.  HENT:     Head: Normocephalic and atraumatic.     Nose: Nose normal.     Mouth/Throat:     Pharynx: Oropharynx is clear.  Eyes:     General: No scleral icterus.    Extraocular Movements: Extraocular movements intact.     Conjunctiva/sclera: Conjunctivae normal.     Pupils: Pupils are equal, round, and reactive to light.  Neck:     Musculoskeletal: Normal range of motion and neck supple. No neck rigidity.  Cardiovascular:     Rate and Rhythm: Normal rate and  regular rhythm.     Pulses: Normal pulses.     Heart sounds: Normal heart sounds.  Pulmonary:     Effort: Pulmonary effort is normal. No respiratory distress.     Breath sounds: Normal breath sounds. No wheezing.  Abdominal:     General: Abdomen is flat. There is no distension.     Palpations: Abdomen is soft.     Tenderness: There is no abdominal tenderness. There is no guarding.  Skin:    General: Skin is dry.  Neurological:     General: No focal deficit present.     Mental Status: She is alert and oriented to person, place, and time. Mental status is at baseline.     GCS: GCS eye subscore  is 4. GCS verbal subscore is 5. GCS motor subscore is 6.     Cranial Nerves: No cranial nerve deficit.     Sensory: No sensory deficit.     Motor: No weakness.     Coordination: Coordination normal.     Gait: Gait normal.  Psychiatric:        Mood and Affect: Mood normal.        Behavior: Behavior normal.        Thought Content: Thought content normal.     Comments: Anxious.      ED Treatments / Results  Labs (all labs ordered are listed, but only abnormal results are displayed) Labs Reviewed  BASIC METABOLIC PANEL - Abnormal; Notable for the following components:      Result Value   Glucose, Bld 100 (*)    BUN 7 (*)    Creatinine, Ser 1.42 (*)    GFR calc non Af Amer 36 (*)    GFR calc Af Amer 41 (*)    All other components within normal limits  CBC  TROPONIN I (HIGH SENSITIVITY)    EKG None  Radiology Dg Chest 2 View  Result Date: 06/26/2019 CLINICAL DATA:  Chest pain, shortness of breath and fatigue. EXAM: CHEST - 2 VIEW COMPARISON:  06/22/2019 FINDINGS: The cardiac silhouette, mediastinal and hilar contours are normal and stable. Mild tortuosity of the thoracic aorta. Stable mild eventration right hemidiaphragm. No infiltrates, edema or effusions. No worrisome pulmonary lesions. The bony thorax is intact. IMPRESSION: No acute cardiopulmonary findings. Electronically Signed    By: Marijo Sanes M.D.   On: 06/26/2019 13:29    Procedures Procedures (including critical care time)  Medications Ordered in ED Medications  sodium chloride flush (NS) 0.9 % injection 3 mL (has no administration in time range)  alum & mag hydroxide-simeth (MAALOX/MYLANTA) 200-200-20 MG/5ML suspension 30 mL (30 mLs Oral Given 06/26/19 1433)    And  lidocaine (XYLOCAINE) 2 % viscous mouth solution 15 mL (15 mLs Oral Given 06/26/19 1433)     Initial Impression / Assessment and Plan / ED Course  I have reviewed the triage vital signs and the nursing notes.  Pertinent labs & imaging results that were available during my care of the patient were reviewed by me and considered in my medical decision making (see chart for details).        DG Chest was personally reviewed and demonstrates no evidence of infiltrates, edema, effusion, or any other acute cardiopulmonary findings.  Her CBC demonstrated no evidence of anemia or leukocytosis concerning for infection that might explain her fatigue and weakness.  Her BMP was reviewed and there were no electrolyte abnormalities that would explain her symptoms and her renal function is consistent with her baseline.  Her vital signs are all within normal limits and she is in no acute distress.  Reassess patient after she was given Mylanta and viscous lidocaine and she reports that she is feeling better.  She is also reassured by the negative troponin and unremarkable cardiac work-up.  She believes that there could be an element of anxiety that is contributing to her chest discomfort.  She states that her PCP has arranged for a cardiology appointment in 2 days.  I recommended that she continue to go to that appointment, as scheduled, so that she may receive additional cardiac work-up for her PACs on EKG.  I also recommend that she follow-up with her gastroenterologist at [facility] gastroenterology for ongoing evaluation and management of her  care.  Patient  voiced understanding and agreement to the plan.  She was reassured and feeling better at time of discharge. All of the evaluation and work-up results were discussed with the patient and any family at bedside. They were provided opportunity to ask any additional questions and have none at this time. They have expressed understanding of verbal discharge instructions as well as return precautions and are agreeable to the plan.    Final Clinical Impressions(s) / ED Diagnoses   Final diagnoses:  Reflux gastritis    ED Discharge Orders    None       Corena Herter, PA-C 06/26/19 1542    Milton Ferguson, MD 06/27/19 1001

## 2019-06-26 NOTE — Discharge Instructions (Signed)
Please follow-up with your gastroenterologist at [facility] for ongoing evaluation and management of your reflux symptoms. Please continue to look for Mylanta at a pharmacy as that medication improved your symptoms here today.  Please attend your scheduled cardiology appointment on Tuesday.  Be sure to follow-up with your PCP and let her know that you came to the ED today.  Return to the ED or seek medical attention immediately should you develop any new or worsening chest pain, difficulty breathing, uncontrolled nausea and vomiting, inability to eat or drink, or any other new or worsening symptoms.

## 2019-06-27 ENCOUNTER — Telehealth: Payer: Self-pay | Admitting: General Practice

## 2019-06-27 NOTE — Telephone Encounter (Signed)
Negative COVID results given. Patient results "NOT Detected." Caller expressed understanding. ° °

## 2019-06-28 ENCOUNTER — Ambulatory Visit (INDEPENDENT_AMBULATORY_CARE_PROVIDER_SITE_OTHER): Payer: Medicare HMO | Admitting: Cardiology

## 2019-06-28 ENCOUNTER — Other Ambulatory Visit: Payer: Self-pay

## 2019-06-28 ENCOUNTER — Encounter: Payer: Self-pay | Admitting: Cardiology

## 2019-06-28 VITALS — BP 104/67 | HR 76 | Ht 61.0 in | Wt 173.8 lb

## 2019-06-28 DIAGNOSIS — R1013 Epigastric pain: Secondary | ICD-10-CM

## 2019-06-28 DIAGNOSIS — R0789 Other chest pain: Secondary | ICD-10-CM

## 2019-06-28 NOTE — Progress Notes (Signed)
Clinical Summary Ms. Bowlen is a 78 y.o.female seen as new consult, referred by NP [facility] for chest pain.   1. Chest pain - ER visit 06/2019 with chest pain - trop neg. CXR no acute process. EKG SR, LAFB. Possible anterolateral Qwaves in ER EKG but not seen in 06/17/19 EKG, I think related to lead placement - from ER notes symptoms improved with mylanta and viscous lidocaine.  CAD risk factors: HTN, HL  - history of GERD and PUD - remote cath 20 years she reports was ok.   - chest pain started late September. Pressure midchest/epigastric, 5/10 in severity. Usually occurs when she gets up in the morning. Can have SOB. Increased belching, makes better. Lasts < 1 minute. Sporadic in frequency.  - unsure of any change in frequency. Stable severity - tolerates mild to moderate housework and yardwork, can have some generalized fatigue -better with deep breathing - may be worst with eating. Can be better with pepto bismol.     Past Medical History:  Diagnosis Date  . Anxiety   . Depression   . Diverticulosis   . Esophageal stricture   . Family hx of colorectal cancer   . GERD (gastroesophageal reflux disease)   . Helicobacter pylori (H. pylori) 1997   EGD  . Hemorrhoids   . Hiatal hernia   . Hx of adenomatous colonic polyps   . Hyperlipemia   . Hypertension   . PUD (peptic ulcer disease) 1997   EGD  . Thyroid disease   . Vertigo      Allergies  Allergen Reactions  . Biaxin [Clarithromycin]   . Cephalexin   . Ciprofloxacin   . Doxycycline     Headaches  . Flagyl [Metronidazole Hcl]   . Ketek [Telithromycin]   . Sulfa Antibiotics      Current Outpatient Medications  Medication Sig Dispense Refill  . ALPRAZolam (XANAX) 0.25 MG tablet Take 1 tablet (0.25 mg total) by mouth at bedtime as needed for anxiety. (Patient taking differently: Take 0.25 mg by mouth 2 (two) times daily. Take 1/2 tablet by mouth twice daily.) 30 tablet 5  . atorvastatin (LIPITOR) 20 MG  tablet Take 1 tablet (20 mg total) by mouth daily. 90 tablet 3  . cetirizine (ZYRTEC) 10 MG tablet Take 1 tablet (10 mg total) by mouth daily. 30 tablet 11  . Chlorphen-PE-Acetaminophen (NOREL AD PO) Take 1 tablet by mouth every other day.    Marland Kitchen dexlansoprazole (DEXILANT) 60 MG capsule Take 1 capsule (60 mg total) by mouth daily. Take 1 capsule (60 mg total) by mouth daily. 90 capsule 3  . fluticasone (FLONASE) 50 MCG/ACT nasal spray Place 1 spray into both nostrils daily. 16 g 5  . irbesartan (AVAPRO) 75 MG tablet Take 1 tablet (75 mg total) by mouth daily. (Please make 6 mos med ckup) 90 tablet 2  . levothyroxine (SYNTHROID) 75 MCG tablet TAKE (1) TABLET DAILY BE- FORE BREAKFAST. 90 tablet 3  . meclizine (ANTIVERT) 25 MG tablet TAKE ONE TABLET BY MOUTH THREE TIMES DAILY AS NEEDED FOR DIZZINESS 30 tablet 4  . Vitamin D, Ergocalciferol, (DRISDOL) 1.25 MG (50000 UT) CAPS capsule Take 1 capsule (50,000 Units total) by mouth every 7 (seven) days for 8 doses. Then begin Vitamin D 400 IU every day 8 capsule 0   No current facility-administered medications for this visit.      Past Surgical History:  Procedure Laterality Date  . ABDOMINAL HYSTERECTOMY    . BILATERAL OOPHORECTOMY    .  CHOLECYSTECTOMY       Allergies  Allergen Reactions  . Biaxin [Clarithromycin]   . Cephalexin   . Ciprofloxacin   . Doxycycline     Headaches  . Flagyl [Metronidazole Hcl]   . Ketek [Telithromycin]   . Sulfa Antibiotics       Family History  Problem Relation Age of Onset  . Colon cancer Sister   . Stroke Mother   . Heart disease Mother   . Parkinson's disease Father   . Ovarian cancer Sister   . Esophageal cancer Neg Hx   . Stomach cancer Neg Hx   . Kidney disease Neg Hx   . Liver disease Neg Hx   . Diabetes Neg Hx      Social History Ms. Hackett reports that she has never smoked. She has never used smokeless tobacco. Ms. Talon reports no history of alcohol use.   Review of Systems  CONSTITUTIONAL: No weight loss, fever, chills, weakness or fatigue.  HEENT: Eyes: No visual loss, blurred vision, double vision or yellow sclerae.No hearing loss, sneezing, congestion, runny nose or sore throat.  SKIN: No rash or itching.  CARDIOVASCULAR: per hpi RESPIRATORY: No shortness of breath, cough or sputum.  GASTROINTESTINAL: No anorexia, nausea, vomiting or diarrhea. No abdominal pain or blood.  GENITOURINARY: No burning on urination, no polyuria NEUROLOGICAL: No headache, dizziness, syncope, paralysis, ataxia, numbness or tingling in the extremities. No change in bowel or bladder control.  MUSCULOSKELETAL: No muscle, back pain, joint pain or stiffness.  LYMPHATICS: No enlarged nodes. No history of splenectomy.  PSYCHIATRIC: No history of depression or anxiety.  ENDOCRINOLOGIC: No reports of sweating, cold or heat intolerance. No polyuria or polydipsia.  Marland Kitchen   Physical Examination Today's Vitals   06/28/19 0855  BP: 104/67  Pulse: 76  SpO2: 99%  Weight: 173 lb 12.8 oz (78.8 kg)  Height: 5\' 1"  (1.549 m)   Body mass index is 32.84 kg/m.  Gen: resting comfortably, no acute distress HEENT: no scleral icterus, pupils equal round and reactive, no palptable cervical adenopathy,  CV: RRR, no m/r,g no jvd Resp: Clear to auscultation bilaterally GI: abdomen is soft, non-tender, non-distended, normal bowel sounds, no hepatosplenomegaly MSK: extremities are warm, no edema.  Skin: warm, no rash Neuro:  no focal deficits Psych: appropriate affect      Assessment and Plan  1. Epigastric pain/Chest pain - atypical epigastric pain not consistent with cardiac chest pain. Would not plan on further cardiac testing at this time. Agree with GI follow up and further management.     F?u 4 months  Arnoldo Lenis, M.D.

## 2019-06-28 NOTE — Patient Instructions (Signed)
Your physician recommends that you schedule a follow-up appointment in: 4 MONTHS WITH DR BRANCH  Your physician recommends that you continue on your current medications as directed. Please refer to the Current Medication list given to you today.  Thank you for choosing [facility]!!    

## 2019-07-01 ENCOUNTER — Ambulatory Visit (HOSPITAL_COMMUNITY): Payer: Medicare HMO

## 2019-07-08 ENCOUNTER — Telehealth: Payer: Self-pay | Admitting: Physician Assistant

## 2019-07-08 ENCOUNTER — Other Ambulatory Visit: Payer: Self-pay

## 2019-07-08 DIAGNOSIS — R1084 Generalized abdominal pain: Secondary | ICD-10-CM

## 2019-07-08 NOTE — Telephone Encounter (Signed)
Spoke with the patient. She had previously cancelled the CT of her abdomen and pelvis because she had an appointment with cardiology. Felt overwhelmed and unable to go to 2 appointments in the same week. She is ready to reschedule. CT abd/pelvis at [facility] 07/14/19 at 1:30. NPO after 9:30 am. Drink 1st bottle of constrast at 11:30 am. Drink the 2nd bottle of contrast at 12:30 pm. Arrive to the radiology dept at 1:15 pm. Wear a mask. Come alone.

## 2019-07-11 ENCOUNTER — Other Ambulatory Visit: Payer: Self-pay | Admitting: Family

## 2019-07-11 ENCOUNTER — Telehealth: Payer: Self-pay | Admitting: Physician Assistant

## 2019-07-11 NOTE — Telephone Encounter (Signed)
The pt states she has bloating, fatigue and weakness with SOB.  She states she was seen on the ED a few weeks ago for the same.  She had a cardiology work up that she states was negative.  She has a CT scan set up for 11/19 and will keep that appt as planned.  I advised her to try gas x and pepcid at bedtime.  I will also send to Amy to review.

## 2019-07-11 NOTE — Telephone Encounter (Signed)
Called patient back and she was concerned because she is not feeling better since she called a few hours ago. She took gas-X and got a little relief. I asked her to give It a little time and take the Pepcid in the evening as advised for a few days and see if that helps

## 2019-07-12 ENCOUNTER — Telehealth: Payer: Self-pay | Admitting: Family

## 2019-07-12 MED ORDER — ALPRAZOLAM 0.25 MG PO TABS: 0.2500 mg | ORAL_TABLET | Freq: Two times a day (BID) | ORAL | 2 refills | Status: DC | PRN

## 2019-07-12 NOTE — Telephone Encounter (Signed)
Ok.. will wait for CT results

## 2019-07-12 NOTE — Telephone Encounter (Signed)
Pt called for refill of Xanax but was denied stating she needed appointment  To see Susan Contreras but she was just seen by [facility] on 10/23. She would like a call to explain why.

## 2019-07-12 NOTE — Telephone Encounter (Signed)
Prescription sent to pharmacy. Not sure why she did not have refills.

## 2019-07-12 NOTE — Telephone Encounter (Signed)
Patient aware and verbalized understanding. °

## 2019-07-14 ENCOUNTER — Ambulatory Visit (HOSPITAL_COMMUNITY)
Admission: RE | Admit: 2019-07-14 | Discharge: 2019-07-14 | Disposition: A | Discharge status: Home / Self Care | Payer: Medicare HMO | Source: Ambulatory Visit | Attending: Physician Assistant | Admitting: Physician Assistant

## 2019-07-14 ENCOUNTER — Other Ambulatory Visit: Payer: Self-pay

## 2019-07-14 DIAGNOSIS — R109 Unspecified abdominal pain: Secondary | ICD-10-CM | POA: Diagnosis not present

## 2019-07-14 DIAGNOSIS — R1084 Generalized abdominal pain: Secondary | ICD-10-CM | POA: Insufficient documentation

## 2019-07-14 MED ORDER — SODIUM CHLORIDE (PF) 0.9 % IJ SOLN
INTRAMUSCULAR | Status: AC
  Filled 2019-07-14: qty 50

## 2019-07-14 MED ORDER — IOHEXOL 300 MG/ML  SOLN
100.0000 mL | Freq: Once | INTRAMUSCULAR | Status: AC | PRN
  Administered 2019-07-14: 100 mL via INTRAVENOUS

## 2019-07-15 ENCOUNTER — Telehealth: Payer: Self-pay | Admitting: Physician Assistant

## 2019-07-15 NOTE — Telephone Encounter (Signed)
Pt called inquiring about imaging results, she would like a call back today.

## 2019-07-15 NOTE — Telephone Encounter (Signed)
Amy would you look at the CT from yesterday. This patient will not be satisfied with a negative report. She will want direction on any "next step." Thanks

## 2019-07-19 ENCOUNTER — Telehealth: Payer: Self-pay | Admitting: Physician Assistant

## 2019-07-19 NOTE — Telephone Encounter (Signed)
Pt returned your call, she would like a call back today.

## 2019-07-19 NOTE — Telephone Encounter (Signed)
Patient advised. She did not take the Xifaxan. Does not want to. Discussed her anti-anxiety medication. Discussed the positive aspects of her negative CT.

## 2019-07-19 NOTE — Telephone Encounter (Signed)
Please let pt know I reviewed the CT - no worrisome findings at all , no sign of cancer or inflammatory process. She hopefully has completed the course of Xifaxan  For probable bacterial overgrowth,  Continue Dexilant once daily  I don't think she needs any further GI workup - I want her to take her Xanax regularly twice daily

## 2019-07-19 NOTE — Telephone Encounter (Signed)
Tried to call the patient. No answer. Did not leave a message.

## 2019-07-20 ENCOUNTER — Telehealth: Payer: Self-pay | Admitting: Cardiology

## 2019-07-20 NOTE — Telephone Encounter (Signed)
Pt says she has been having problems with her stomach (said she had eaten things that made her stomach upset) then cleaned her house and over exerted herself - says she had anxiety and took 1/2 Xanax and is feeling much better - denies SOB/chest pain/dizziness - pt aware that if symptoms change to contact us and that we would be closed tomorrow and Friday

## 2019-07-20 NOTE — Telephone Encounter (Signed)
Patient called stating that she is not feeling well today. States that she is very fatigue and having episodes of shortness of breath. [phone])

## 2019-07-22 ENCOUNTER — Telehealth: Payer: Self-pay | Admitting: Physician Assistant

## 2019-08-07 ENCOUNTER — Telehealth: Payer: Self-pay | Admitting: Nurse Practitioner

## 2019-08-07 NOTE — Telephone Encounter (Signed)
Patient called answering service, having problems with diarrhea again this am. Always having constipation or diarrhea and say Amy Esterwood,P.A has been following her and that recent CT scan didn't show anything. She feels better after taking Pepto but feels like further tests are needed to figure out what is going on.   I will forward to Nicoletta Ba, P.A. to see if she wants to see patient back in office  / try medications / pursue further testing.

## 2019-08-20 ENCOUNTER — Telehealth: Payer: Self-pay | Admitting: Physician Assistant

## 2019-08-22 ENCOUNTER — Telehealth: Payer: Self-pay | Admitting: Physician Assistant

## 2019-08-22 NOTE — Telephone Encounter (Signed)
Patient called stating she is experiencing diarrhea and can not keep anything down, losing weight and feeling weak also inquired some kind of medication to help her.

## 2019-08-22 NOTE — Telephone Encounter (Signed)
Pt states she is going to urgent care if you need to reach her call her cell

## 2019-08-22 NOTE — Telephone Encounter (Signed)
Pt is calling again pertaining to this message.  Pt states she is very weak.

## 2019-08-22 NOTE — Telephone Encounter (Signed)
Pt to call today with update

## 2019-08-23 ENCOUNTER — Telehealth: Payer: Self-pay | Admitting: Family

## 2019-08-23 NOTE — Telephone Encounter (Signed)
Pt calling back states she was not seen at the urgent care because of the 2 hour wait.  Wants to speak to a nurse about below symptoms.

## 2019-08-23 NOTE — Telephone Encounter (Signed)
Patient c/o diarrhea and right side rib hurts. Patient advised to take Imodium. Patient is drinking Gatorade. What else do you advise. Patient does not have fever.Patient thinks something is going on. Patient states she went to urgent care and had to wait 2 hours in the car and still never got seen.

## 2019-08-23 NOTE — Telephone Encounter (Signed)
NTBS because needs work up

## 2019-08-23 NOTE — Telephone Encounter (Signed)
States "everything I eat goes right through me." She is better with Pepto Bismol. Complains of no appetite. Feels sure she is taking in sufficient fluids, but she is concerned she may be dehydrated. She wonders if her Dexilant is causing her "diarrhea." She wants to change to Protonix which she states she has in the home. She is considering not taking the Dexilant today.  Agrees to an appointment and understands there are no openings this week. She has gone to an urgent care, but was discouraged by the waiting in her car.  She considers going to the ED and states she does not because she knows she will wait there also. Discussed supportive treatment of fluids and soft foods. Denies fever, nausea or vomiting.

## 2019-08-24 ENCOUNTER — Ambulatory Visit: Payer: Medicare HMO | Admitting: Family Medicine

## 2019-08-24 DIAGNOSIS — R197 Diarrhea, unspecified: Secondary | ICD-10-CM | POA: Diagnosis not present

## 2019-08-24 DIAGNOSIS — E860 Dehydration: Secondary | ICD-10-CM | POA: Diagnosis not present

## 2019-08-24 DIAGNOSIS — K529 Noninfective gastroenteritis and colitis, unspecified: Secondary | ICD-10-CM | POA: Diagnosis not present

## 2019-08-24 NOTE — Telephone Encounter (Signed)
Patient aware and states that she will go to urgent care to be evaluated.

## 2019-08-29 ENCOUNTER — Telehealth: Payer: Self-pay | Admitting: Physician Assistant

## 2019-08-29 NOTE — Telephone Encounter (Signed)
Pt would like to know whether she can take amoxicillin for her symptoms.

## 2019-08-29 NOTE — Telephone Encounter (Signed)
Spoke with the patient. 30 minute phone call.  Patient has looked up Ulcerative Colitis. She thinks this could be her problem. States she went to an urgent care. She was given Lomotil and "something for nausea, but I ain't go no nausea." She tells me the Lomotil is to be taken 4 times a day if she needs it, "but that's too much." She has taken it "once or twice, but it don't do no good." She then tells me she does not have diarrhea, rather "loose bowel movements." Stayed in the bed "all day yesterday. I had gas. Then I took a Xanax and that my gas better."  Afebrile. She is difficult to follow. Her complaints change constantly through the conversation. Appointment is on 09/13/19 per the patient.

## 2019-08-29 NOTE — Telephone Encounter (Signed)
Please tell Susan Contreras to stop reading online about illnesses !. She had a CT scan this fall that did not show any evidence of ulcerative colitis , and she has declined offers to do endoscopic workup like Colon or Endo . If she really wants further workup  then she needs to have a Colonoscopy.  No , I would not take  an antibiotic  for nausea or diarrhea .

## 2019-08-29 NOTE — Telephone Encounter (Signed)
Patient aware. This all was discussed in the phone contact with the patient.

## 2019-08-29 NOTE — Telephone Encounter (Signed)
Spoke with the patient's spouse. Patient has "run down to the pharmacy and will be back in a few minutes." Plan to call her back today.

## 2019-09-01 DIAGNOSIS — R42 Dizziness and giddiness: Secondary | ICD-10-CM | POA: Diagnosis not present

## 2019-09-01 DIAGNOSIS — R5383 Other fatigue: Secondary | ICD-10-CM | POA: Diagnosis not present

## 2019-09-01 DIAGNOSIS — R197 Diarrhea, unspecified: Secondary | ICD-10-CM | POA: Diagnosis not present

## 2019-09-01 DIAGNOSIS — R531 Weakness: Secondary | ICD-10-CM | POA: Diagnosis not present

## 2019-09-01 DIAGNOSIS — R109 Unspecified abdominal pain: Secondary | ICD-10-CM | POA: Diagnosis not present

## 2019-09-05 ENCOUNTER — Telehealth: Payer: Self-pay | Admitting: Physician Assistant

## 2019-09-05 NOTE — Telephone Encounter (Signed)
Called and spoke with patient -patient offered an appt with Jaclyn Shaggy (to be seen sooner)-patient declined to be seen by alternative provider-reports she wants to "stay with Amy because she knows what is going on with me"; patient reports she will call back to the office should her symptoms worsen, she were to have questions/concers arise; Patient verbalized understanding of information/instructions;

## 2019-09-07 ENCOUNTER — Telehealth: Payer: Self-pay | Admitting: Family

## 2019-09-07 DIAGNOSIS — R0602 Shortness of breath: Secondary | ICD-10-CM | POA: Diagnosis not present

## 2019-09-07 DIAGNOSIS — R069 Unspecified abnormalities of breathing: Secondary | ICD-10-CM | POA: Diagnosis not present

## 2019-09-07 NOTE — Telephone Encounter (Signed)
Called to speak with patient and she stated that she couldn't talk at this time because she was on the phone with someone else. She advised that she would call the office back

## 2019-09-13 ENCOUNTER — Encounter: Payer: Self-pay | Admitting: Physician Assistant

## 2019-09-13 ENCOUNTER — Ambulatory Visit: Payer: Medicare HMO | Admitting: Physician Assistant

## 2019-09-13 VITALS — BP 120/70 | HR 82 | Temp 97.4°F | Ht 61.0 in | Wt 162.0 lb

## 2019-09-13 DIAGNOSIS — K219 Gastro-esophageal reflux disease without esophagitis: Secondary | ICD-10-CM | POA: Diagnosis not present

## 2019-09-13 DIAGNOSIS — R630 Anorexia: Secondary | ICD-10-CM | POA: Diagnosis not present

## 2019-09-13 DIAGNOSIS — K625 Hemorrhage of anus and rectum: Secondary | ICD-10-CM

## 2019-09-13 DIAGNOSIS — R5383 Other fatigue: Secondary | ICD-10-CM

## 2019-09-13 DIAGNOSIS — R634 Abnormal weight loss: Secondary | ICD-10-CM

## 2019-09-13 DIAGNOSIS — R197 Diarrhea, unspecified: Secondary | ICD-10-CM | POA: Diagnosis not present

## 2019-09-13 MED ORDER — AMITRIPTYLINE HCL 25 MG PO TABS: 25.0000 mg | ORAL_TABLET | Freq: Every day | ORAL | 6 refills | Status: DC

## 2019-09-13 NOTE — Patient Instructions (Addendum)
If you are age 77 or older, your body mass index should be between 23-30. Your Body mass index is 30.61 kg/m. If this is out of the aforementioned range listed, please consider follow up with your Primary Care Provider.  If you are age 58 or younger, your body mass index should be between 19-25. Your Body mass index is 30.61 kg/m. If this is out of the aformentioned range listed, please consider follow up with your Primary Care Provider.    We have sent the following medications to your pharmacy for you to pick up at your convenience: Amitriptyline 25 mg nightly.  Continue all other medications Use Imodium as needed.

## 2019-09-13 NOTE — Progress Notes (Signed)
Assessment and plan reviewed 

## 2019-09-13 NOTE — Progress Notes (Signed)
Subjective:    Patient ID: Susan Contreras, female    DOB: Feb 12, 1941, 79 y.o.   MRN: [id number]  HPI [name] is a 79 year old white female, known to Dr. Henrene Pastor and myself, who was last seen in the office on 06/22/2019.  She has history of IBS, diarrhea predominant, chronic GERD, generalized anxiety disorder, diverticulosis and remote history of adenomatous colon polyps. She comes in today with ongoing complaints about intermittent diarrhea.  She says if she takes a dose of Imodium she may not have a bowel movement for 2 or 3 days.  She has noticed bright red blood with bowel movement on a couple of occasions recently. Her main complaint however is that of having absolutely no energy.  She says she has no appetite and is having a hard time eating.  Her weight is down about 12 pounds since last office visit.  She admits to being anxious.  She has some complaints of discomfort under her left shoulder blade.  She is wondering if she has liver problems and is worried that something "bad" is wrong. I suggested that I think she is having problems with depression accounting for some of her physical symptoms.  She started crying and initially said she was not depressed and then admitted that she is depressed but still does not think that accounts for all of her physical symptoms. She is worried about cancer.  She would like her stomach and colon checked.  We reviewed the CT scan from 07/14/2019 which was negative.  We gave her a course of Xifaxan, I am not certain that she completed this. She was Covid tested last week because of her above symptoms and that was negative.  Last colonoscopy was done in 2007 per Dr. Henrene Pastor with finding of diverticulosis, internal and external hemorrhoids, no polyps.  Review of Systems Pertinent positive and negative review of systems were noted in the above HPI section.  All other review of systems was otherwise negative.  Outpatient Encounter Medications as of 09/13/2019   Medication Sig  . ALPRAZolam (XANAX) 0.25 MG tablet Take 1 tablet (0.25 mg total) by mouth 2 (two) times daily as needed for anxiety.  Marland Kitchen atorvastatin (LIPITOR) 20 MG tablet Take 1 tablet (20 mg total) by mouth daily.  . cetirizine (ZYRTEC) 10 MG tablet Take 10 mg by mouth as needed for allergies.  . Chlorphen-PE-Acetaminophen (NOREL AD PO) Take 1 tablet by mouth as needed.   Marland Kitchen dexlansoprazole (DEXILANT) 60 MG capsule Take 1 capsule (60 mg total) by mouth daily. Take 1 capsule (60 mg total) by mouth daily.  . fluticasone (FLONASE) 50 MCG/ACT nasal spray Place 1-2 sprays into both nostrils as needed for allergies or rhinitis.  Marland Kitchen irbesartan (AVAPRO) 75 MG tablet Take 1 tablet (75 mg total) by mouth daily. (Please make 6 mos med ckup)  . levothyroxine (SYNTHROID) 75 MCG tablet TAKE (1) TABLET DAILY BE- FORE BREAKFAST.  Marland Kitchen meclizine (ANTIVERT) 25 MG tablet TAKE ONE TABLET BY MOUTH THREE TIMES DAILY AS NEEDED FOR DIZZINESS  . amitriptyline (ELAVIL) 25 MG tablet Take 1 tablet (25 mg total) by mouth at bedtime.   No facility-administered encounter medications on file as of 09/13/2019.   Allergies  Allergen Reactions  . Biaxin [Clarithromycin]   . Cephalexin   . Ciprofloxacin   . Doxycycline     Headaches  . Flagyl [Metronidazole Hcl]   . Ketek [Telithromycin]   . Sulfa Antibiotics    Patient Active Problem List   Diagnosis Date Noted  .  Benzodiazepine dependence ([place]) 05/19/2018  . Controlled substance agreement signed 05/19/2018  . Obesity (BMI 30.0-34.9) 11/26/2017  . Metabolic syndrome [id number]  . GAD (generalized anxiety disorder) 08/14/2015  . Allergic rhinitis 08/14/2015  . GERD (gastroesophageal reflux disease) 08/14/2015  . Hyperlipidemia 08/14/2015  . Vitamin D deficiency 08/14/2015  . Hypothyroidism 05/29/2014  . Cholelithiasis 01/15/2011  . ADENOMATOUS COLONIC POLYP 11/11/2007  . Essential hypertension 11/11/2007  . ESOPHAGEAL STRICTURE 11/11/2007  . Diaphragmatic hernia  11/11/2007  . DIVERTICULOSIS OF COLON 11/11/2007   Social History   Socioeconomic History  . Marital status: Married    Spouse name: Not on file  . Number of children: 2  . Years of education: Not on file  . Highest education level: Not on file  Occupational History  . Occupation: Retired  Tobacco Use  . Smoking status: Never Smoker  . Smokeless tobacco: Never Used  Substance and Sexual Activity  . Alcohol use: No  . Drug use: No  . Sexual activity: Not on file  Other Topics Concern  . Not on file  Social History Narrative   2 caffeine drinks daily    Social Determinants of Health   Financial Resource Strain:   . Difficulty of Paying Living Expenses: Not on file  Food Insecurity:   . Worried About Charity fundraiser in the Last Year: Not on file  . Ran Out of Food in the Last Year: Not on file  Transportation Needs:   . Lack of Transportation (Medical): Not on file  . Lack of Transportation (Non-Medical): Not on file  Physical Activity:   . Days of Exercise per Week: Not on file  . Minutes of Exercise per Session: Not on file  Stress:   . Feeling of Stress : Not on file  Social Connections:   . Frequency of Communication with Friends and Family: Not on file  . Frequency of Social Gatherings with Friends and Family: Not on file  . Attends Religious Services: Not on file  . Active Member of Clubs or Organizations: Not on file  . Attends Archivist Meetings: Not on file  . Marital Status: Not on file  Intimate Partner Violence:   . Fear of Current or Ex-Partner: Not on file  . Emotionally Abused: Not on file  . Physically Abused: Not on file  . Sexually Abused: Not on file    Ms. Muhs's family history includes Colon cancer in her sister; Heart disease in her mother; Ovarian cancer in her sister; Parkinson's disease in her father; Stroke in her mother.      Objective:    Vitals:   09/13/19 1129  BP: 120/70  Pulse: 82  Temp: (!) 97.4 F (36.3 C)     Physical Exam Well-developed well-nourished elderly white female in no acute distress.  , Weight, 162 BMI 30.6  Weight is down 12 pounds since last office visit patient is anxious and somewhat tearful HEENT; nontraumatic normocephalic, EOMI, PER R LA, sclera anicteric. Oropharynx; not examined/mask/Covid Neck; supple, no JVD Cardiovascular; regular rate and rhythm with S1-S2, no murmur rub or gallop Pulmonary; Clear bilaterally Abdomen; soft, nontender, nondistended, no palpable mass or hepatosplenomegaly, bowel sounds are active Rectal; not done today Skin; benign exam, no jaundice rash or appreciable lesions Extremities; no clubbing cyanosis or edema skin warm and dry Neuro/Psych; alert and oriented x4, grossly nonfocal mood and affect anxious       Assessment & Plan:   #60 79 year old white female with chronic GERD-on Dexilant #  2 chronic diarrhea felt secondary to IBS-D--patient is intolerant to multiple medications.  She was given a course of Xifaxan fall 2020 no clear improvement in symptoms if she completed this.  Consider microscopic colitis  #3 remote history of adenomatous colon polyps-last colonoscopy 2007 no recurrent polyps #4 diverticulosis #5 weight loss, lack of appetite and general malaise/fatigue.  Recent labs and CT of the abdomen and pelvis have been unrevealing.  I think the symptoms are most likely secondary to depression  #6 neurolysed anxiety disorder  Plan; continue Imodium as needed Continue Dexilant 60 mg p.o. daily--query if this could be playing a role in diarrhea  Discussed colonoscopy with random biopsies and EGD with patient.  Patient will be scheduled with Dr. Henrene Pastor.  Procedure was discussed in detail with patient including indications risks and benefits and she is agreeable to proceed.  Start trial of amitriptyline 25 mg p.o. nightly.  This may need to be titrated gradually.  Patient was advised this may take 2 to 3 weeks to notice any  significant effect    Addendum; while nurse was instructing for colonoscopy and informed patient she would have to be Covid tested prior to procedures, she decided not to proceed and said it was "just too much" Hopefully she will reconsider and call back to schedule.   Zerline Melchior Genia Harold PA-C 09/13/2019   Cc: [name], FNP

## 2019-09-21 ENCOUNTER — Telehealth: Payer: Self-pay | Admitting: Family

## 2019-09-21 NOTE — Telephone Encounter (Signed)
Made appt

## 2019-09-22 ENCOUNTER — Encounter: Payer: Self-pay | Admitting: Family

## 2019-09-22 ENCOUNTER — Ambulatory Visit (INDEPENDENT_AMBULATORY_CARE_PROVIDER_SITE_OTHER): Payer: Medicare HMO | Admitting: Family

## 2019-09-22 DIAGNOSIS — J3089 Other allergic rhinitis: Secondary | ICD-10-CM | POA: Diagnosis not present

## 2019-09-22 DIAGNOSIS — J019 Acute sinusitis, unspecified: Secondary | ICD-10-CM

## 2019-09-22 MED ORDER — LEVOCETIRIZINE DIHYDROCHLORIDE 5 MG PO TABS: 5.0000 mg | ORAL_TABLET | Freq: Every evening | ORAL | 1 refills | Status: DC

## 2019-09-22 MED ORDER — AMOXICILLIN 500 MG PO CAPS: 500.0000 mg | ORAL_CAPSULE | Freq: Three times a day (TID) | ORAL | 0 refills | Status: DC

## 2019-09-22 NOTE — Progress Notes (Signed)
   Virtual Visit via telephone Note Due to COVID-19 pandemic this visit was conducted virtually. This visit type was conducted due to national recommendations for restrictions regarding the COVID-19 Pandemic (e.g. social distancing, sheltering in place) in an effort to limit this patient's exposure and mitigate transmission in our community. All issues noted in this document were discussed and addressed.  A physical exam was not performed with this format.  I connected with Susan Contreras on 09/22/19 at 9:32 AM by telephone and verified that I am speaking with the correct person using two identifiers. Susan Contreras is currently located at home and no one is currently with her during visit. The provider, [name], FNP is located in their office at time of visit.  I discussed the limitations, risks, security and privacy concerns of performing an evaluation and management service by telephone and the availability of in person appointments. I also discussed with the patient that there may be a patient responsible charge related to this service. The patient expressed understanding and agreed to proceed.   History and Present Illness:  Sinusitis This is a new problem. The current episode started 1 to 4 weeks ago. The problem has been waxing and waning since onset. There has been no fever. Her pain is at a severity of 0/10. The pain is mild. Associated symptoms include congestion, headaches, sinus pressure (left) and sneezing. Pertinent negatives include no coughing, ear pain, shortness of breath or sore throat. Past treatments include oral decongestants.      Review of Systems  HENT: Positive for congestion, sinus pressure (left) and sneezing. Negative for ear pain and sore throat.   Respiratory: Negative for cough and shortness of breath.   Neurological: Positive for headaches.     Observations/Objective: No SOB or distress noted  Assessment and Plan: 1. Allergic rhinitis due to other  allergic trigger, unspecified seasonality Start xyzal daily - levocetirizine (XYZAL) 5 MG tablet; Take 1 tablet (5 mg total) by mouth every evening.  Dispense: 90 tablet; Refill: 1  2. Acute sinusitis, recurrence not specified, unspecified location - Take meds as prescribed - Use a cool mist humidifier  -Use saline nose sprays frequently -Force fluids -For any cough or congestion  Use plain Mucinex- regular strength or max strength is fine -For fever or aces or pains- take tylenol or ibuprofen. -Throat lozenges if help -Call if symptoms worsen or do not improve  - amoxicillin (AMOXIL) 500 MG capsule; Take 1 capsule (500 mg total) by mouth 3 (three) times daily.  Dispense: 30 capsule; Refill: 0    I discussed the assessment and treatment plan with the patient. The patient was provided an opportunity to ask questions and all were answered. The patient agreed with the plan and demonstrated an understanding of the instructions.   The patient was advised to call back or seek an in-person evaluation if the symptoms worsen or if the condition fails to improve as anticipated.  The above assessment and management plan was discussed with the patient. The patient verbalized understanding of and has agreed to the management plan. Patient is aware to call the clinic if symptoms persist or worsen. Patient is aware when to return to the clinic for a follow-up visit. Patient educated on when it is appropriate to go to the emergency department.   Time call ended:9:15 AM    I provided 13 minutes of non-face-to-face time during this encounter.    [name], FNP

## 2019-10-04 ENCOUNTER — Encounter: Payer: Self-pay | Admitting: Family

## 2019-10-04 ENCOUNTER — Ambulatory Visit (INDEPENDENT_AMBULATORY_CARE_PROVIDER_SITE_OTHER): Payer: Medicare HMO | Admitting: Family

## 2019-10-04 DIAGNOSIS — F1320 Sedative, hypnotic or anxiolytic dependence, uncomplicated: Secondary | ICD-10-CM | POA: Diagnosis not present

## 2019-10-04 DIAGNOSIS — F320 Major depressive disorder, single episode, mild: Secondary | ICD-10-CM | POA: Diagnosis not present

## 2019-10-04 DIAGNOSIS — F411 Generalized anxiety disorder: Secondary | ICD-10-CM | POA: Diagnosis not present

## 2019-10-04 DIAGNOSIS — Z79899 Other long term (current) drug therapy: Secondary | ICD-10-CM | POA: Diagnosis not present

## 2019-10-04 MED ORDER — ESCITALOPRAM OXALATE 5 MG PO TABS: ORAL_TABLET | ORAL | 0 refills | Status: DC

## 2019-10-04 MED ORDER — ESCITALOPRAM OXALATE 10 MG PO TABS: 10.0000 mg | ORAL_TABLET | Freq: Every day | ORAL | 3 refills | Status: DC

## 2019-10-04 NOTE — Progress Notes (Signed)
Virtual Visit via telephone Note Due to COVID-19 pandemic this visit was conducted virtually. This visit type was conducted due to national recommendations for restrictions regarding the COVID-19 Pandemic (e.g. social distancing, sheltering in place) in an effort to limit this patient's exposure and mitigate transmission in our community. All issues noted in this document were discussed and addressed.  A physical exam was not performed with this format.  I connected with Susan Contreras on 10/04/19 at 11:05 AM by telephone and verified that I am speaking with the correct person using two identifiers. Susan Contreras is currently located at home and no one is currently with her during visit. The provider, [name], FNP is located in their office at time of visit.  I discussed the limitations, risks, security and privacy concerns of performing an evaluation and management service by telephone and the availability of in person appointments. I also discussed with the patient that there may be a patient responsible charge related to this service. The patient expressed understanding and agreed to proceed.   History and Present Illness:  PT calls the office today with GAD and Depression. She saw her GI provider on 09/13/19 with diarrhea, weight loss, fatigue, anxiety, and depression. She was started on amitriptyline 25 mg daily. She reports this was too strong and made her sleepy. She reports she tried cutting it in half but was still drowsy. She reports she cried yesterday for half of the day.  Anxiety Presents for follow-up visit. Symptoms include decreased concentration, depressed mood, excessive worry, insomnia, irritability, nervous/anxious behavior and restlessness. Symptoms occur most days. The severity of symptoms is moderate. The quality of sleep is good.    Depression        This is a new problem.  The current episode started more than 1 month ago.   The onset quality is gradual.   The problem  occurs every several days.  Associated symptoms include decreased concentration, fatigue, insomnia, irritable, restlessness, decreased interest and appetite change.  Associated symptoms include no helplessness.     The symptoms are aggravated by family issues.  Past treatments include nothing.  Compliance with treatment is good.  Past medical history includes anxiety.       Review of Systems  Constitutional: Positive for appetite change, fatigue and irritability.  Psychiatric/Behavioral: Positive for decreased concentration and depression. The patient is nervous/anxious and has insomnia.      Observations/Objective: No SOB or distress noted, anxiousness noted  Assessment and Plan: 1. GAD (generalized anxiety disorder) - escitalopram (LEXAPRO) 5 MG tablet; Take 1 tablet (5 mg total) by mouth daily for 14 days, THEN 2 tablets (10 mg total) daily for 14 days.  Dispense: 42 tablet; Refill: 0 - escitalopram (LEXAPRO) 10 MG tablet; Take 1 tablet (10 mg total) by mouth daily.  Dispense: 90 tablet; Refill: 3  2. Benzodiazepine dependence ([place])  3. Controlled substance agreement signed  4. Depression, major, single episode, mild (HCC) - escitalopram (LEXAPRO) 5 MG tablet; Take 1 tablet (5 mg total) by mouth daily for 14 days, THEN 2 tablets (10 mg total) daily for 14 days.  Dispense: 42 tablet; Refill: 0 - escitalopram (LEXAPRO) 10 MG tablet; Take 1 tablet (10 mg total) by mouth daily.  Dispense: 90 tablet; Refill: 3    Will add Lexapro 5 mg and tamper to 10 mg in 2 weeks Stress management discussed Possible adverse effects discussed RTO in 6 weeks to discussed GAD and Depression  I discussed the assessment and treatment plan  with the patient. The patient was provided an opportunity to ask questions and all were answered. The patient agreed with the plan and demonstrated an understanding of the instructions.   The patient was advised to call back or seek an in-person evaluation if the  symptoms worsen or if the condition fails to improve as anticipated.  The above assessment and management plan was discussed with the patient. The patient verbalized understanding of and has agreed to the management plan. Patient is aware to call the clinic if symptoms persist or worsen. Patient is aware when to return to the clinic for a follow-up visit. Patient educated on when it is appropriate to go to the emergency department.   Time call ended:  11:22 AM   I provided 17 minutes of non-face-to-face time during this encounter.    [name], FNP

## 2019-10-06 ENCOUNTER — Telehealth: Payer: Self-pay | Admitting: Physician Assistant

## 2019-10-06 ENCOUNTER — Telehealth: Payer: Self-pay | Admitting: Family

## 2019-10-06 NOTE — Telephone Encounter (Signed)
She was advised to keep a log and monitor which medications work best.

## 2019-10-10 ENCOUNTER — Telehealth: Payer: Self-pay | Admitting: Family

## 2019-10-11 ENCOUNTER — Encounter: Payer: Self-pay | Admitting: Family

## 2019-10-11 ENCOUNTER — Other Ambulatory Visit: Payer: Self-pay

## 2019-10-11 ENCOUNTER — Other Ambulatory Visit: Payer: Self-pay | Admitting: Family

## 2019-10-11 ENCOUNTER — Ambulatory Visit (INDEPENDENT_AMBULATORY_CARE_PROVIDER_SITE_OTHER): Payer: Medicare HMO | Admitting: Family

## 2019-10-11 DIAGNOSIS — F411 Generalized anxiety disorder: Secondary | ICD-10-CM | POA: Diagnosis not present

## 2019-10-11 DIAGNOSIS — K2100 Gastro-esophageal reflux disease with esophagitis, without bleeding: Secondary | ICD-10-CM

## 2019-10-11 MED ORDER — PANTOPRAZOLE SODIUM 40 MG PO TBEC: 40.0000 mg | DELAYED_RELEASE_TABLET | Freq: Every day | ORAL | 3 refills | Status: DC

## 2019-10-11 MED ORDER — PANTOPRAZOLE SODIUM 20 MG PO TBEC: 20.0000 mg | DELAYED_RELEASE_TABLET | Freq: Two times a day (BID) | ORAL | 2 refills | Status: DC

## 2019-10-11 NOTE — Progress Notes (Signed)
Virtual Visit via telephone Note Due to COVID-19 pandemic this visit was conducted virtually. This visit type was conducted due to national recommendations for restrictions regarding the COVID-19 Pandemic (e.g. social distancing, sheltering in place) in an effort to limit this patient's exposure and mitigate transmission in our community. All issues noted in this document were discussed and addressed.  A physical exam was not performed with this format.  I connected with Susan Contreras on 10/11/19 at 11:48 AM by telephone and verified that I am speaking with the correct person using two identifiers. Susan Contreras is currently located at home and no one is currently with her during visit. The provider, [name], FNP is located in their office at time of visit.  I discussed the limitations, risks, security and privacy concerns of performing an evaluation and management service by telephone and the availability of in person appointments. I also discussed with the patient that there may be a patient responsible charge related to this service. The patient expressed understanding and agreed to proceed.   History and Present Illness:  Pt presents to the office today with belching and abdominal pain. PT has seen GI and they thought it was related to her anxiety. She does report her xanax helped her stomach last night. Pt was prescribed Lexapro, but never started it because she was "scared".  Gastroesophageal Reflux She complains of belching, globus sensation and heartburn. She reports no choking, no dysphagia or no early satiety. This is a chronic problem. The current episode started more than 1 year ago. The problem occurs occasionally. The problem has been waxing and waning. The symptoms are aggravated by stress and certain foods. Risk factors include obesity. She has tried a PPI for the symptoms. The treatment provided mild relief.      Review of Systems  Respiratory: Negative for choking.    Gastrointestinal: Positive for heartburn. Negative for dysphagia.  All other systems reviewed and are negative.    Observations/Objective: No SOB or distress noted, very anxious  Assessment and Plan: Susan Contreras comes in today with chief complaint of No chief complaint on file.   Diagnosis and orders addressed:  1. Gastroesophageal reflux disease with esophagitis, unspecified whether hemorrhage Will change Dexilant 60 mg to Protonix 20 mg BID -Diet discussed- Avoid fried, spicy, citrus foods, caffeine and alcohol -Do not eat 2-3 hours before bedtime -Encouraged small frequent meals -Avoid NSAID's -RTO if symptoms worsen or do not improve Keep GI follow up - pantoprazole (PROTONIX) 20 MG tablet; Take 1 tablet (20 mg total) by mouth 2 (two) times daily before a meal.  Dispense: 60 tablet; Refill: 2  2. GAD (generalized anxiety disorder) Worsening, long discussion with patient about symptom management. She will start Lexapro 5 mg today.  I believe a lot of her anxiety is causing her GI symptoms. Pt very anxious today. She is very anxious today.      I discussed the assessment and treatment plan with the patient. The patient was provided an opportunity to ask questions and all were answered. The patient agreed with the plan and demonstrated an understanding of the instructions.   The patient was advised to call back or seek an in-person evaluation if the symptoms worsen or if the condition fails to improve as anticipated.  The above assessment and management plan was discussed with the patient. The patient verbalized understanding of and has agreed to the management plan. Patient is aware to call the clinic if symptoms persist or worsen. Patient is  aware when to return to the clinic for a follow-up visit. Patient educated on when it is appropriate to go to the emergency department.   Time call ended:  12:19 pm  I provided 31 minutes of non-face-to-face time during this encounter.     [name], FNP

## 2019-10-11 NOTE — Telephone Encounter (Signed)
Refill request, last rx'd 09/22/18 #30 with 4 refills Last seen 10/04/19 Ok to refill?

## 2019-10-12 ENCOUNTER — Telehealth: Payer: Self-pay | Admitting: Family

## 2019-10-12 NOTE — Telephone Encounter (Signed)
Patient aware and verbalized understanding. °

## 2019-10-12 NOTE — Telephone Encounter (Signed)
Pt called stating that she had a televisit with Christy yesterday and says Alyse Low did change some of her medications, but says she is having sharp pains in her back, shoulders, arms, and chest. Says she has had issues similar to this before but says not nearly as painful as she feels right now.

## 2019-10-17 ENCOUNTER — Telehealth: Payer: Self-pay | Admitting: Family

## 2019-10-17 NOTE — Telephone Encounter (Signed)
Has she been taking the Lexapro daily? This takes about 4 weeks to fully get to therapeutic levels.   We just switched her to prontonix from dexilant  because it was cheaper and "worked better". Is it worsening, if so what is her symptoms?

## 2019-10-17 NOTE — Telephone Encounter (Signed)
Patient states that she has only taken one of her Lexapro's due to the side effects.  States that it "knocked her off her feet" and made her sleep for hours.  States that she had took a nap for 2 hours and was not able to walk after her nap.  States it felt like her legs were jello.  She has not been able to eat and it has been making her weak.  Patient does not want to try anything for depression at this time.  States that she has her xanax to take BID and would like to continue taking that and see how that works and if she will need to add another medication.  Patient also states that the dexilant does better but it is too expensive.  She has been taking the Protonix for 7 days and it makes her have a BM every times she eats.  States she will just stay on the medication to see if it improves.  Please advise

## 2019-10-17 NOTE — Telephone Encounter (Signed)
Patient recently had a phone call with christy and is requesting her to call her.  Offered a visit but refused since she just had a visit with her.  Please advise

## 2019-10-18 ENCOUNTER — Telehealth: Payer: Self-pay | Admitting: Family

## 2019-10-18 NOTE — Telephone Encounter (Signed)
REFERRAL REQUEST Telephone Note  What type of referral do you need? Gastro  Have you been seen at our office for this problem?yes  (Advise that they will likely need an appointment with their PCP before a referral can be done)  Is there a particular doctor or location that you prefer? [facility]   Patient notified that referrals can take up to a week or longer to process. If they haven't heard anything within a week they should call back and speak with the referral department.

## 2019-10-19 ENCOUNTER — Other Ambulatory Visit: Payer: Self-pay

## 2019-10-19 ENCOUNTER — Telehealth: Payer: Self-pay | Admitting: Family

## 2019-10-19 ENCOUNTER — Encounter (HOSPITAL_COMMUNITY): Payer: Self-pay

## 2019-10-19 ENCOUNTER — Emergency Department (HOSPITAL_COMMUNITY): Payer: Medicare HMO

## 2019-10-19 ENCOUNTER — Emergency Department (HOSPITAL_COMMUNITY)
Admission: EM | Admit: 2019-10-19 | Discharge: 2019-10-19 | Disposition: A | Discharge status: Home / Self Care | Payer: Medicare HMO | Attending: Emergency Medicine | Admitting: Emergency Medicine

## 2019-10-19 DIAGNOSIS — R0689 Other abnormalities of breathing: Secondary | ICD-10-CM | POA: Diagnosis not present

## 2019-10-19 DIAGNOSIS — R079 Chest pain, unspecified: Secondary | ICD-10-CM | POA: Diagnosis not present

## 2019-10-19 DIAGNOSIS — I10 Essential (primary) hypertension: Secondary | ICD-10-CM | POA: Insufficient documentation

## 2019-10-19 DIAGNOSIS — R531 Weakness: Secondary | ICD-10-CM | POA: Insufficient documentation

## 2019-10-19 DIAGNOSIS — E039 Hypothyroidism, unspecified: Secondary | ICD-10-CM | POA: Insufficient documentation

## 2019-10-19 DIAGNOSIS — R457 State of emotional shock and stress, unspecified: Secondary | ICD-10-CM | POA: Diagnosis not present

## 2019-10-19 DIAGNOSIS — R0789 Other chest pain: Secondary | ICD-10-CM | POA: Diagnosis not present

## 2019-10-19 DIAGNOSIS — R000 Tachycardia, unspecified: Secondary | ICD-10-CM | POA: Diagnosis not present

## 2019-10-19 DIAGNOSIS — Z79899 Other long term (current) drug therapy: Secondary | ICD-10-CM | POA: Diagnosis not present

## 2019-10-19 LAB — CBC WITH DIFFERENTIAL/PLATELET
Abs Immature Granulocytes: 0.01 10*3/uL (ref 0.00–0.07)
Basophils Absolute: 0.1 10*3/uL (ref 0.0–0.1)
Basophils Relative: 1 %
Eosinophils Absolute: 0.2 10*3/uL (ref 0.0–0.5)
Eosinophils Relative: 3 %
HCT: 41 % (ref 36.0–46.0)
Hemoglobin: 13.3 g/dL (ref 12.0–15.0)
Immature Granulocytes: 0 %
Lymphocytes Relative: 28 %
Lymphs Abs: 2 10*3/uL (ref 0.7–4.0)
MCH: 29.1 pg (ref 26.0–34.0)
MCHC: 32.4 g/dL (ref 30.0–36.0)
MCV: 89.7 fL (ref 80.0–100.0)
Monocytes Absolute: 0.5 10*3/uL (ref 0.1–1.0)
Monocytes Relative: 7 %
Neutro Abs: 4.4 10*3/uL (ref 1.7–7.7)
Neutrophils Relative %: 61 %
Platelets: 271 10*3/uL (ref 150–400)
RBC: 4.57 MIL/uL (ref 3.87–5.11)
RDW: 13.4 % (ref 11.5–15.5)
WBC: 7.2 10*3/uL (ref 4.0–10.5)
nRBC: 0 % (ref 0.0–0.2)

## 2019-10-19 LAB — HEPATIC FUNCTION PANEL
ALT: 9 U/L (ref 0–44)
AST: 16 U/L (ref 15–41)
Albumin: 3.5 g/dL (ref 3.5–5.0)
Alkaline Phosphatase: 69 U/L (ref 38–126)
Bilirubin, Direct: 0.2 mg/dL (ref 0.0–0.2)
Indirect Bilirubin: 1 mg/dL — ABNORMAL HIGH (ref 0.3–0.9)
Total Bilirubin: 1.2 mg/dL (ref 0.3–1.2)
Total Protein: 6.6 g/dL (ref 6.5–8.1)

## 2019-10-19 LAB — BASIC METABOLIC PANEL
Anion gap: 11 (ref 5–15)
BUN: 6 mg/dL — ABNORMAL LOW (ref 8–23)
CO2: 23 mmol/L (ref 22–32)
Calcium: 9 mg/dL (ref 8.9–10.3)
Chloride: 104 mmol/L (ref 98–111)
Creatinine, Ser: 1.35 mg/dL — ABNORMAL HIGH (ref 0.44–1.00)
GFR calc Af Amer: 44 mL/min — ABNORMAL LOW (ref >60)
GFR calc non Af Amer: 38 mL/min — ABNORMAL LOW (ref >60)
Glucose, Bld: 94 mg/dL (ref 70–99)
Potassium: 3.4 mmol/L — ABNORMAL LOW (ref 3.5–5.1)
Sodium: 138 mmol/L (ref 135–145)

## 2019-10-19 LAB — TROPONIN I (HIGH SENSITIVITY)
Troponin I (High Sensitivity): 5 ng/L (ref <18)
Troponin I (High Sensitivity): 6 ng/L (ref <18)

## 2019-10-19 MED ORDER — SODIUM CHLORIDE 0.9 % IV BOLUS
1000.0000 mL | Freq: Once | INTRAVENOUS | Status: AC
  Administered 2019-10-19: 12:00:00 1000 mL via INTRAVENOUS

## 2019-10-19 MED ORDER — ONDANSETRON HCL 4 MG/2ML IJ SOLN
4.0000 mg | Freq: Once | INTRAMUSCULAR | Status: AC
  Administered 2019-10-19: 4 mg via INTRAVENOUS
  Filled 2019-10-19: qty 2

## 2019-10-19 NOTE — ED Notes (Signed)
Pt discharged at this time. Discharge instructions reviewed, opportunity to ask questions provided, pt verbalizes understanding of discharge instructions.  Pt called her granddaughter in order to pick her up

## 2019-10-19 NOTE — ED Provider Notes (Signed)
[facility] EMERGENCY DEPARTMENT Provider Note   CSN: [id number] Arrival date & time: 10/19/19  1104     History Chief Complaint  Patient presents with  . Weakness    Susan Contreras is a 79 y.o. female.  The history is provided by the patient.  Weakness Severity:  Mild Onset quality:  Gradual Timing:  Intermittent Progression:  Waxing and waning Chronicity:  New Context: change in medication (started on lexapro and ever since feels weak at times in legs and shoulder pains, sometimes chest, sometimes sick to stomach)   Relieved by:  Nothing Worsened by:  Nothing Associated symptoms: abdominal pain, chest pain and nausea   Associated symptoms: no arthralgias, no cough, no dizziness, no dysuria, no fever, no headaches, no seizures, no shortness of breath and no vomiting   Risk factors: new medications   Risk factors: no coronary artery disease        Past Medical History:  Diagnosis Date  . Anxiety   . Depression   . Diverticulosis   . Esophageal stricture   . Family hx of colorectal cancer   . GERD (gastroesophageal reflux disease)   . Helicobacter pylori (H. pylori) 1997   EGD  . Hemorrhoids   . Hiatal hernia   . Hx of adenomatous colonic polyps   . Hyperlipemia   . Hypertension   . PUD (peptic ulcer disease) 1997   EGD  . Thyroid disease   . Vertigo     Patient Active Problem List   Diagnosis Date Noted  . Benzodiazepine dependence ([place]) 05/19/2018  . Controlled substance agreement signed 05/19/2018  . Obesity (BMI 30.0-34.9) 11/26/2017  . Metabolic syndrome [id number]  . GAD (generalized anxiety disorder) 08/14/2015  . Allergic rhinitis 08/14/2015  . GERD (gastroesophageal reflux disease) 08/14/2015  . Hyperlipidemia 08/14/2015  . Vitamin D deficiency 08/14/2015  . Hypothyroidism 05/29/2014  . Cholelithiasis 01/15/2011  . ADENOMATOUS COLONIC POLYP 11/11/2007  . Essential hypertension 11/11/2007  . ESOPHAGEAL STRICTURE 11/11/2007   . Diaphragmatic hernia 11/11/2007  . DIVERTICULOSIS OF COLON 11/11/2007    Past Surgical History:  Procedure Laterality Date  . ABDOMINAL HYSTERECTOMY    . BILATERAL OOPHORECTOMY    . CHOLECYSTECTOMY       OB History   No obstetric history on file.     Family History  Problem Relation Age of Onset  . Colon cancer Sister   . Stroke Mother   . Heart disease Mother   . Parkinson's disease Father   . Ovarian cancer Sister   . Esophageal cancer Neg Hx   . Stomach cancer Neg Hx   . Kidney disease Neg Hx   . Liver disease Neg Hx   . Diabetes Neg Hx     Social History   Tobacco Use  . Smoking status: Never Smoker  . Smokeless tobacco: Never Used  Substance Use Topics  . Alcohol use: No  . Drug use: No    Home Medications Prior to Admission medications   Medication Sig Start Date End Date Taking? Authorizing Provider  ALPRAZolam (XANAX) 0.25 MG tablet Take 1 tablet (0.25 mg total) by mouth 2 (two) times daily as needed for anxiety. 07/12/19   [name], FNP  atorvastatin (LIPITOR) 20 MG tablet Take 1 tablet (20 mg total) by mouth daily. 06/23/19   Hawks, Alyse Low A, FNP  Chlorphen-PE-Acetaminophen (NOREL AD PO) Take 1 tablet by mouth as needed.     [provider]  escitalopram (LEXAPRO) 10 MG tablet Take  1 tablet (10 mg total) by mouth daily. 10/25/19   [name] A, FNP  escitalopram (LEXAPRO) 5 MG tablet Take 1 tablet (5 mg total) by mouth daily for 14 days, THEN 2 tablets (10 mg total) daily for 14 days. 10/04/19 11/01/19  [name], FNP  fluticasone (FLONASE) 50 MCG/ACT nasal spray Place 1-2 sprays into both nostrils as needed for allergies or rhinitis.    [provider]  irbesartan (AVAPRO) 75 MG tablet Take 1 tablet (75 mg total) by mouth daily. (Please make 6 mos med ckup) 12/28/18   [name] A, FNP  levocetirizine (XYZAL) 5 MG tablet Take 1 tablet (5 mg total) by mouth every evening. 09/22/19   [name], FNP  levothyroxine  (SYNTHROID) 75 MCG tablet TAKE (1) TABLET DAILY BE- FORE BREAKFAST. 12/28/18   [name], FNP  meclizine (ANTIVERT) 25 MG tablet TAKE ONE TABLET BY MOUTH THREE TIMES DAILY AS NEEDED FOR DIZZINESS 10/11/19   [name] A, FNP  pantoprazole (PROTONIX) 40 MG tablet Take 1 tablet (40 mg total) by mouth daily. 10/11/19   [name], FNP    Allergies    Biaxin [clarithromycin], Cephalexin, Ciprofloxacin, Doxycycline, Flagyl [metronidazole hcl], Ketek [telithromycin], and Sulfa antibiotics  Review of Systems   Review of Systems  Constitutional: Negative for chills and fever.  HENT: Negative for ear pain and sore throat.   Eyes: Negative for pain and visual disturbance.  Respiratory: Negative for cough and shortness of breath.   Cardiovascular: Positive for chest pain. Negative for palpitations.  Gastrointestinal: Positive for abdominal pain and nausea. Negative for vomiting.  Genitourinary: Negative for dysuria and hematuria.  Musculoskeletal: Negative for arthralgias and back pain.  Skin: Negative for color change and rash.  Neurological: Positive for weakness. Negative for dizziness, tremors, seizures, syncope, facial asymmetry, speech difficulty, light-headedness, numbness and headaches.  Psychiatric/Behavioral: The patient is nervous/anxious.   All other systems reviewed and are negative.   Physical Exam Updated Vital Signs  ED Triage Vitals  Enc Vitals Group     BP 10/19/19 1114 126/65     Pulse Rate 10/19/19 1114 75     Resp 10/19/19 1114 14     Temp 10/19/19 1114 98.8 F (37.1 C)     Temp Source 10/19/19 1114 Oral     SpO2 10/19/19 1114 100 %     Weight 10/19/19 1115 155 lb (70.3 kg)     Height 10/19/19 1115 5\' 1"  (1.549 m)     Head Circumference --      Peak Flow --      Pain Score 10/19/19 1115 0     Pain Loc --      Pain Edu? --      Excl. in [place]? --     Physical Exam Vitals and nursing note reviewed.  Constitutional:      General: She is not in acute  distress.    Appearance: She is well-developed. She is not ill-appearing.  HENT:     Head: Normocephalic and atraumatic.     Nose: Nose normal.     Mouth/Throat:     Mouth: Mucous membranes are moist.  Eyes:     Extraocular Movements: Extraocular movements intact.     Conjunctiva/sclera: Conjunctivae normal.     Pupils: Pupils are equal, round, and reactive to light.  Cardiovascular:     Rate and Rhythm: Normal rate and regular rhythm.     Pulses: Normal pulses.     Heart  sounds: Normal heart sounds. No murmur.  Pulmonary:     Effort: Pulmonary effort is normal. No respiratory distress.     Breath sounds: Normal breath sounds.  Abdominal:     Palpations: Abdomen is soft.     Tenderness: There is no abdominal tenderness.  Musculoskeletal:     Cervical back: Normal range of motion and neck supple.  Skin:    General: Skin is warm and dry.     Capillary Refill: Capillary refill takes less than 2 seconds.  Neurological:     General: No focal deficit present.     Mental Status: She is alert and oriented to person, place, and time.     Cranial Nerves: No cranial nerve deficit.     Sensory: No sensory deficit.     Motor: No weakness.     Coordination: Coordination normal.     ED Results / Procedures / Treatments   Labs (all labs ordered are listed, but only abnormal results are displayed) Labs Reviewed  BASIC METABOLIC PANEL - Abnormal; Notable for the following components:      Result Value   Potassium 3.4 (*)    BUN 6 (*)    Creatinine, Ser 1.35 (*)    GFR calc non Af Amer 38 (*)    GFR calc Af Amer 44 (*)    All other components within normal limits  HEPATIC FUNCTION PANEL - Abnormal; Notable for the following components:   Indirect Bilirubin 1.0 (*)    All other components within normal limits  CBC WITH DIFFERENTIAL/PLATELET  TROPONIN I (HIGH SENSITIVITY)  TROPONIN I (HIGH SENSITIVITY)    EKG EKG Interpretation  Date/Time:  Wednesday October 19 2019 11:10:50  EST Ventricular Rate:  73 PR Interval:    QRS Duration: 91 QT Interval:  395 QTC Calculation: 436 R Axis:   -49 Text Interpretation: Sinus arrhythmia Left anterior fascicular block Low voltage, precordial leads Consider anterior infarct Confirmed by Lennice Sites [phone]) on 10/19/2019 11:12:59 AM   Radiology DG Chest Portable 1 View  Result Date: 10/19/2019 CLINICAL DATA:  Chest pain EXAM: PORTABLE CHEST 1 VIEW COMPARISON:  June 26, 2019 FINDINGS: There is slight left base atelectasis. Lungs elsewhere are clear. Heart size and pulmonary vascularity are normal. No adenopathy. No pneumothorax. Rudimentary cervical ribs noted. IMPRESSION: Left base atelectasis. Lungs elsewhere clear. Cardiac silhouette within normal limits. Electronically Signed   By: Lowella Grip III M.D.   On: 10/19/2019 11:42    Procedures Procedures (including critical care time)  Medications Ordered in ED Medications  ondansetron (ZOFRAN) injection 4 mg (4 mg Intravenous Given 10/19/19 1159)  sodium chloride 0.9 % bolus 1,000 mL (0 mLs Intravenous Stopped 10/19/19 1318)    ED Course  I have reviewed the triage vital signs and the nursing notes.  Pertinent labs & imaging results that were available during my care of the patient were reviewed by me and considered in my medical decision making (see chart for details).    MDM Rules/Calculators/A&P                      Smitha Fletes is a 79 year old female with history of high cholesterol, hypertension, anxiety, depression who presents to the emergency department with weakness.  Patient with normal vitals.  No fever.  Patient with several vague complaints that have been occurring over the last several weeks on and off.  She states generalized weakness at times, arthralgias, chest pain, abdominal pain, nausea.  She has been  unable to follow-up with her primary care doctor.  She was recently started on Lexapro in which she states that the symptoms started after  taking that medication.  Currently she does not have any chest pain.  She has normal neurological exam.  Overall she is well-appearing.  EKG shows sinus rhythm.  No ischemic changes.  Atypical story for ACS but will check a troponin.  Will evaluate for electrolyte abnormality and anemia and infection with chest x-ray.  Labs unremarkable.  Serial troponins normal.  Doubt ACS.  Chest x-ray without signs of infection.  No significant anemia, electrolyte abnormality, kidney injury.  Overall no emergent findings.  Possibly medication side effect.  Recommend discussing change in Lexapro.  Discharged in good condition.  Given return precautions.  This chart was dictated using voice recognition software.  Despite best efforts to proofread,  errors can occur which can change the documentation meaning.   Final Clinical Impression(s) / ED Diagnoses Final diagnoses:  Weakness    Rx / DC Orders ED Discharge Orders    None       Lennice Sites, DO 10/19/19 1514

## 2019-10-19 NOTE — Telephone Encounter (Signed)
I agree that the patient needs to go to the emergency department

## 2019-10-19 NOTE — Telephone Encounter (Signed)
Spoke to husband and he states patient is currently at the ER.

## 2019-10-19 NOTE — ED Triage Notes (Signed)
Pt reports chest discomfort that radiates to bilateral shoulders, "this occurs on and for for a while now". Pt states she started a new medication, lexapro 5mg , last Thursday, but after taking one dose she did not feel right and she has not taken any more of the medication.

## 2019-10-19 NOTE — Telephone Encounter (Addendum)
Patient states she has not had labs in a while and she would like to have some done. Can we place order.   Patient states she has been having chest pain and arm pain on the right side. She states she was having chest pain yesterday. Patient advised that she needs to be evaluated by ER for the current chest pain that she is having. She states that she is so weak she can barely walk, she stays nauseated, and dizzy. Patient advised to go to ED. I was on the phone with patient for 22 minutes explaining the importance of being evaluated. Patient states she is unsure if she will go.

## 2019-10-29 ENCOUNTER — Encounter (HOSPITAL_COMMUNITY): Payer: Self-pay

## 2019-10-29 ENCOUNTER — Emergency Department (HOSPITAL_COMMUNITY)
Admission: EM | Admit: 2019-10-29 | Discharge: 2019-10-30 | Disposition: A | Discharge status: Home / Self Care | Payer: Medicare HMO | Attending: Emergency Medicine | Admitting: Emergency Medicine

## 2019-10-29 ENCOUNTER — Emergency Department (HOSPITAL_COMMUNITY): Payer: Medicare HMO

## 2019-10-29 ENCOUNTER — Other Ambulatory Visit: Payer: Self-pay

## 2019-10-29 DIAGNOSIS — R413 Other amnesia: Secondary | ICD-10-CM | POA: Diagnosis not present

## 2019-10-29 DIAGNOSIS — Z79899 Other long term (current) drug therapy: Secondary | ICD-10-CM | POA: Diagnosis not present

## 2019-10-29 DIAGNOSIS — R410 Disorientation, unspecified: Secondary | ICD-10-CM | POA: Diagnosis not present

## 2019-10-29 DIAGNOSIS — I10 Essential (primary) hypertension: Secondary | ICD-10-CM | POA: Diagnosis not present

## 2019-10-29 DIAGNOSIS — G4489 Other headache syndrome: Secondary | ICD-10-CM | POA: Diagnosis not present

## 2019-10-29 DIAGNOSIS — E039 Hypothyroidism, unspecified: Secondary | ICD-10-CM | POA: Insufficient documentation

## 2019-10-29 DIAGNOSIS — R4182 Altered mental status, unspecified: Secondary | ICD-10-CM | POA: Diagnosis not present

## 2019-10-29 LAB — CBC WITH DIFFERENTIAL/PLATELET
Abs Immature Granulocytes: 0.02 10*3/uL (ref 0.00–0.07)
Basophils Absolute: 0.1 10*3/uL (ref 0.0–0.1)
Basophils Relative: 1 %
Eosinophils Absolute: 0.3 10*3/uL (ref 0.0–0.5)
Eosinophils Relative: 3 %
HCT: 44.2 % (ref 36.0–46.0)
Hemoglobin: 14.1 g/dL (ref 12.0–15.0)
Immature Granulocytes: 0 %
Lymphocytes Relative: 40 %
Lymphs Abs: 3.6 10*3/uL (ref 0.7–4.0)
MCH: 29 pg (ref 26.0–34.0)
MCHC: 31.9 g/dL (ref 30.0–36.0)
MCV: 90.9 fL (ref 80.0–100.0)
Monocytes Absolute: 0.6 10*3/uL (ref 0.1–1.0)
Monocytes Relative: 7 %
Neutro Abs: 4.4 10*3/uL (ref 1.7–7.7)
Neutrophils Relative %: 49 %
Platelets: 297 10*3/uL (ref 150–400)
RBC: 4.86 MIL/uL (ref 3.87–5.11)
RDW: 13.7 % (ref 11.5–15.5)
WBC: 9 10*3/uL (ref 4.0–10.5)
nRBC: 0 % (ref 0.0–0.2)

## 2019-10-29 LAB — URINALYSIS, ROUTINE W REFLEX MICROSCOPIC
Bacteria, UA: NONE SEEN
Bilirubin Urine: NEGATIVE
Glucose, UA: NEGATIVE mg/dL
Ketones, ur: NEGATIVE mg/dL
Nitrite: NEGATIVE
Protein, ur: NEGATIVE mg/dL
Specific Gravity, Urine: 1.006 (ref 1.005–1.030)
pH: 7 (ref 5.0–8.0)

## 2019-10-29 LAB — TROPONIN I (HIGH SENSITIVITY)
Troponin I (High Sensitivity): 5 ng/L (ref <18)
Troponin I (High Sensitivity): 5 ng/L (ref <18)

## 2019-10-29 LAB — COMPREHENSIVE METABOLIC PANEL
ALT: 11 U/L (ref 0–44)
AST: 19 U/L (ref 15–41)
Albumin: 4.1 g/dL (ref 3.5–5.0)
Alkaline Phosphatase: 73 U/L (ref 38–126)
Anion gap: 10 (ref 5–15)
BUN: 9 mg/dL (ref 8–23)
CO2: 23 mmol/L (ref 22–32)
Calcium: 9.2 mg/dL (ref 8.9–10.3)
Chloride: 103 mmol/L (ref 98–111)
Creatinine, Ser: 1.31 mg/dL — ABNORMAL HIGH (ref 0.44–1.00)
GFR calc Af Amer: 46 mL/min — ABNORMAL LOW (ref >60)
GFR calc non Af Amer: 39 mL/min — ABNORMAL LOW (ref >60)
Glucose, Bld: 86 mg/dL (ref 70–99)
Potassium: 3.5 mmol/L (ref 3.5–5.1)
Sodium: 136 mmol/L (ref 135–145)
Total Bilirubin: 1.2 mg/dL (ref 0.3–1.2)
Total Protein: 7.9 g/dL (ref 6.5–8.1)

## 2019-10-29 MED ORDER — LIDOCAINE VISCOUS HCL 2 % MT SOLN
15.0000 mL | Freq: Once | OROMUCOSAL | Status: AC
  Administered 2019-10-29: 15 mL via ORAL
  Filled 2019-10-29: qty 15

## 2019-10-29 MED ORDER — LORAZEPAM 2 MG/ML IJ SOLN
1.0000 mg | Freq: Once | INTRAMUSCULAR | Status: DC
  Filled 2019-10-29: qty 1

## 2019-10-29 MED ORDER — ALUM & MAG HYDROXIDE-SIMETH 200-200-20 MG/5ML PO SUSP
30.0000 mL | Freq: Once | ORAL | Status: AC
  Administered 2019-10-29: 30 mL via ORAL
  Filled 2019-10-29: qty 30

## 2019-10-29 NOTE — ED Provider Notes (Signed)
[facility] EMERGENCY DEPARTMENT Provider Note   CSN: [id number] Arrival date & time: 10/29/19  1515     History Chief Complaint  Patient presents with  . Headache  . Gastroesophageal Reflux    Susan Contreras is a 79 y.o. female.  HPI      Susan Contreras is a 79 y.o. female, with a history of anxiety, depression, GERD, HTN, hyperlipidemia, PUD, presenting to the ED with what she describes as a "memory issue." She remembers eating supper yesterday evening, but then there were times throughout the evening that she does not remember.  One of her examples, "My husband told me that I had asked for Tylenol last night.  I do not remember that and I do not typically take Tylenol." She remembers watching a show last night at 11:30 PM, but does not remember going to bed.  She also states she spoke with her son at some point last night, but does not remember the conversation. She states this is not happened to her before, however, at one point during our interview, she also stated she was having memory problems for the last several months. She does not think she has had any memory deficits throughout the day today      Past Medical History:  Diagnosis Date  . Anxiety   . Depression   . Diverticulosis   . Esophageal stricture   . Family hx of colorectal cancer   . GERD (gastroesophageal reflux disease)   . Helicobacter pylori (H. pylori) 1997   EGD  . Hemorrhoids   . Hiatal hernia   . Hx of adenomatous colonic polyps   . Hyperlipemia   . Hypertension   . PUD (peptic ulcer disease) 1997   EGD  . Thyroid disease   . Vertigo     Patient Active Problem List   Diagnosis Date Noted  . Benzodiazepine dependence ([place]) 05/19/2018  . Controlled substance agreement signed 05/19/2018  . Obesity (BMI 30.0-34.9) 11/26/2017  . Metabolic syndrome [id number]  . GAD (generalized anxiety disorder) 08/14/2015  . Allergic rhinitis 08/14/2015  . GERD (gastroesophageal reflux disease) 08/14/2015   . Hyperlipidemia 08/14/2015  . Vitamin D deficiency 08/14/2015  . Hypothyroidism 05/29/2014  . Cholelithiasis 01/15/2011  . ADENOMATOUS COLONIC POLYP 11/11/2007  . Essential hypertension 11/11/2007  . ESOPHAGEAL STRICTURE 11/11/2007  . Diaphragmatic hernia 11/11/2007  . DIVERTICULOSIS OF COLON 11/11/2007    Past Surgical History:  Procedure Laterality Date  . ABDOMINAL HYSTERECTOMY    . BILATERAL OOPHORECTOMY    . CHOLECYSTECTOMY       OB History   No obstetric history on file.     Family History  Problem Relation Age of Onset  . Colon cancer Sister   . Stroke Mother   . Heart disease Mother   . Parkinson's disease Father   . Ovarian cancer Sister   . Esophageal cancer Neg Hx   . Stomach cancer Neg Hx   . Kidney disease Neg Hx   . Liver disease Neg Hx   . Diabetes Neg Hx     Social History   Tobacco Use  . Smoking status: Never Smoker  . Smokeless tobacco: Never Used  Substance Use Topics  . Alcohol use: No  . Drug use: No    Home Medications Prior to Admission medications   Medication Sig Start Date End Date Taking? Authorizing Provider  ALPRAZolam (XANAX) 0.25 MG tablet Take 1 tablet (0.25 mg total) by mouth 2 (two) times daily as needed for anxiety.  07/12/19  Yes Hawks, Christy A, FNP  atorvastatin (LIPITOR) 20 MG tablet Take 1 tablet (20 mg total) by mouth daily. 06/23/19  Yes Hawks, Christy A, FNP  Chlorphen-PE-Acetaminophen (NOREL AD PO) Take 1 tablet by mouth as needed.    Yes [provider]  escitalopram (LEXAPRO) 10 MG tablet Take 1 tablet (10 mg total) by mouth daily. 10/25/19  Yes Hawks, Christy A, FNP  fluticasone (FLONASE) 50 MCG/ACT nasal spray Place 1-2 sprays into both nostrils as needed for allergies or rhinitis.   Yes [provider]  irbesartan (AVAPRO) 75 MG tablet Take 1 tablet (75 mg total) by mouth daily. (Please make 6 mos med ckup) 12/28/18  Yes Hawks, Christy A, FNP  levothyroxine (SYNTHROID) 75 MCG tablet TAKE (1)  TABLET DAILY BE- FORE BREAKFAST. 12/28/18  Yes Hawks, Christy A, FNP  meclizine (ANTIVERT) 25 MG tablet TAKE ONE TABLET BY MOUTH THREE TIMES DAILY AS NEEDED FOR DIZZINESS Patient taking differently: Take 25 mg by mouth 3 (three) times daily as needed for dizziness.  10/11/19  Yes Hawks, Christy A, FNP  pantoprazole (PROTONIX) 20 MG tablet Take 20 mg by mouth 2 (two) times daily. 10/11/19  Yes [provider]    Allergies    Biaxin [clarithromycin], Cephalexin, Ciprofloxacin, Doxycycline, Flagyl [metronidazole hcl], Ketek [telithromycin], and Sulfa antibiotics  Review of Systems   Review of Systems  Constitutional: Negative for chills, diaphoresis and fever.  Respiratory: Negative for cough and shortness of breath.   Cardiovascular: Negative for chest pain and leg swelling.  Gastrointestinal: Negative for abdominal pain, diarrhea, nausea and vomiting.  Genitourinary: Negative for dysuria, flank pain, frequency and hematuria.  Musculoskeletal: Negative for back pain.  Neurological: Positive for headaches. Negative for dizziness, syncope, weakness and numbness.  Psychiatric/Behavioral: Positive for confusion.  All other systems reviewed and are negative.   Physical Exam Updated Vital Signs BP (!) 150/63 (BP Location: Right Arm)   Pulse 74   Temp 98.4 F (36.9 C) (Oral)   Ht 5\' 1"  (1.549 m)   Wt 70 kg   SpO2 100%   BMI 29.16 kg/m   Physical Exam Vitals and nursing note reviewed.  Constitutional:      General: She is not in acute distress.    Appearance: She is well-developed. She is not diaphoretic.  HENT:     Head: Normocephalic and atraumatic.     Mouth/Throat:     Mouth: Mucous membranes are moist.     Pharynx: Oropharynx is clear.  Eyes:     Conjunctiva/sclera: Conjunctivae normal.  Cardiovascular:     Rate and Rhythm: Normal rate and regular rhythm.     Pulses: Normal pulses.          Radial pulses are 2+ on the right side and 2+ on the left side.        Posterior tibial pulses are 2+ on the right side and 2+ on the left side.     Heart sounds: Normal heart sounds.     Comments: Tactile temperature in the extremities appropriate and equal bilaterally. Pulmonary:     Effort: Pulmonary effort is normal. No respiratory distress.     Breath sounds: Normal breath sounds.  Abdominal:     Palpations: Abdomen is soft.     Tenderness: There is no abdominal tenderness. There is no guarding.  Musculoskeletal:     Cervical back: Neck supple.     Right lower leg: No edema.     Left lower leg: No edema.  Lymphadenopathy:     Cervical: No cervical adenopathy.  Skin:    General: Skin is warm and dry.  Neurological:     Mental Status: She is alert and oriented to person, place, and time.     Comments: Although patient was able to correctly answer basic orientation questions, she did indeed "seem off."  She would repeat herself and sometimes her story would change.  Her timeline and recollection seemed inaccurate, even of events that she claims to be able to remember. Sensation grossly intact to light touch in the extremities.   Grip strengths equal bilaterally.   Strength 5/5 in all extremities.  No gait disturbance.  Coordination intact.  Cranial nerves III-XII grossly intact.  Handles oral secretions without noted difficulty.  No noted phonation or speech deficit. No facial droop.   Psychiatric:        Mood and Affect: Mood and affect normal.        Speech: Speech normal.        Behavior: Behavior normal.     ED Results / Procedures / Treatments   Labs (all labs ordered are listed, but only abnormal results are displayed) Labs Reviewed  COMPREHENSIVE METABOLIC PANEL - Abnormal; Notable for the following components:      Result Value   Creatinine, Ser 1.31 (*)    GFR calc non Af Amer 39 (*)    GFR calc Af Amer 46 (*)    All other components within normal limits  URINALYSIS, ROUTINE W REFLEX MICROSCOPIC - Abnormal; Notable for the  following components:   Color, Urine STRAW (*)    Hgb urine dipstick SMALL (*)    Leukocytes,Ua MODERATE (*)    All other components within normal limits  CBC WITH DIFFERENTIAL/PLATELET  TROPONIN I (HIGH SENSITIVITY)    BUN  Date Value Ref Range Status  10/29/2019 9 8 - 23 mg/dL Final  10/19/2019 6 (L) 8 - 23 mg/dL Final  06/26/2019 7 (L) 8 - 23 mg/dL Final  06/17/2019 12 8 - 27 mg/dL Final  12/28/2018 10 8 - 27 mg/dL Final  05/19/2018 13 8 - 27 mg/dL Final  03/10/2018 11 8 - 23 mg/dL Final    Comment:    Please note change in reference range.  03/04/2018 12 8 - 27 mg/dL Final   Creat  Date Value Ref Range Status  02/23/2013 1.40 (H) 0.50 - 1.10 mg/dL Final   Creatinine, Ser  Date Value Ref Range Status  10/29/2019 1.31 (H) 0.44 - 1.00 mg/dL Final  10/19/2019 1.35 (H) 0.44 - 1.00 mg/dL Final  06/26/2019 1.42 (H) 0.44 - 1.00 mg/dL Final  06/17/2019 1.36 (H) 0.57 - 1.00 mg/dL Final     EKG EKG Interpretation  Date/Time:  Saturday October 29 2019 15:31:42 EST Ventricular Rate:  70 PR Interval:    QRS Duration: 91 QT Interval:  422 QTC Calculation: 456 R Axis:   -58 Text Interpretation: Normal sinus rhythm Left anterior fascicular block Low voltage, precordial leads Borderline T abnormalities, anterior leads Artifact Confirmed by Fredia Sorrow [id number]) on 10/29/2019 4:06:39 PM   Radiology CT Head Wo Contrast  Result Date: 10/29/2019 CLINICAL DATA:  Altered mental status. Dysarthria. EXAM: CT HEAD WITHOUT CONTRAST TECHNIQUE: Contiguous axial images were obtained from the base of the skull through the vertex without intravenous contrast. COMPARISON:  None. FINDINGS: Brain: Mild-to-moderate enlargement of the ventricles and subarachnoid spaces. Moderate patchy white matter low density in both cerebral hemispheres. No intracranial hemorrhage, mass lesion or CT evidence of  acute infarction. Vascular: No hyperdense vessel or unexpected calcification. Skull: Normal. Negative for  fracture or focal lesion. Sinuses/Orbits: Unremarkable. Other: None. IMPRESSION: 1. No acute abnormality. 2. Mild to moderate cerebral and cerebellar atrophy. 3. Moderate chronic small vessel white matter ischemic changes in both cerebral hemispheres. Electronically Signed   By: Claudie Revering M.D.   On: 10/29/2019 17:36    Procedures Procedures (including critical care time)  Medications Ordered in ED Medications  alum & mag hydroxide-simeth (MAALOX/MYLANTA) 200-200-20 MG/5ML suspension 30 mL (30 mLs Oral Given 10/29/19 1759)    And  lidocaine (XYLOCAINE) 2 % viscous mouth solution 15 mL (15 mLs Oral Given 10/29/19 1759)    ED Course  I have reviewed the triage vital signs and the nursing notes.  Pertinent labs & imaging results that were available during my care of the patient were reviewed by me and considered in my medical decision making (see chart for details).  Clinical Course as of Oct 29 1850  Sat Oct 29, 2019  1754 Mr. Dew states patient has been complaining of headache over the last several days. Decreased appetite over last several days. Intermittent chest pain. States she seemed to act abnormally yesterday around 5 pm. She asked for tylenol twice, but didn't remember this. She typically has great memory. Since last night, she has seemed confused and just "seems off."   [SJ]  [place] with Dr. Lorraine Lax, neurologist.  Agrees with plan for MRI. ED to ED transfer to [facility]. EDP at [facility] can formally consult neurology after MRI, as needed.    [SJ]  [place] with Daleen Bo, EDP. Agrees to accept transfer of patient.   [SJ]    Clinical Course User Index [SJ] Kyaira Trantham C, PA-C   MDM Rules/Calculators/A&P                      Patient presents with complaint of "memory problem." Although she did not have any noted focal neurologic deficits and was able to answer basic orientation questions, her cognition did seem off during my interview.  She seemed as though she may be a  bit confused or have points of time over the last 24 hours she is unable to remember and is trying to logically fill in the gaps. We think patient would benefit from further evaluation with MRI.  Transferred to [facility] ED for this imaging study.  Findings and plan of care discussed with Fredia Sorrow, MD. Dr. Rogene Houston personally evaluated and examined this patient.  Final Clinical Impression(s) / ED Diagnoses Final diagnoses:  Memory problem    Rx / DC Orders ED Discharge Orders    None       Layla Maw 10/29/19 1852    Fredia Sorrow, MD 10/30/19 [phone]

## 2019-10-29 NOTE — ED Triage Notes (Addendum)
Pt brought in by EMS due to complaints of HA for 2-3 days. It got worse this morning but has decreased to 4. Pt reports dry mouth, indigestion and gas . BP 130/100 per EMS. Pt reports episode of memory loss last night. Pt states her husband told her she had asked for a tylenol last night and has no memory of it

## 2019-10-29 NOTE — ED Provider Notes (Signed)
[name] Provider Note   CSN: [id number] Arrival date & time: 10/29/19  1515     History Chief Complaint  Patient presents with  . Headache  . Gastroesophageal Reflux    Susan Contreras is a 79 y.o. female.  Patient with history anxiety, depression, reflux, high blood pressure, high cholesterol presents from outside facility for intermittent memory loss since last night.  No known history of stroke.  Patient denies any weakness, numbness, fevers or infectious symptoms.         Past Medical History:  Diagnosis Date  . Anxiety   . Depression   . Diverticulosis   . Esophageal stricture   . Family hx of colorectal cancer   . GERD (gastroesophageal reflux disease)   . Helicobacter pylori (H. pylori) 1997   EGD  . Hemorrhoids   . Hiatal hernia   . Hx of adenomatous colonic polyps   . Hyperlipemia   . Hypertension   . PUD (peptic ulcer disease) 1997   EGD  . Thyroid disease   . Vertigo     Patient Active Problem List   Diagnosis Date Noted  . Benzodiazepine dependence ([place]) 05/19/2018  . Controlled substance agreement signed 05/19/2018  . Obesity (BMI 30.0-34.9) 11/26/2017  . Metabolic syndrome [id number]  . GAD (generalized anxiety disorder) 08/14/2015  . Allergic rhinitis 08/14/2015  . GERD (gastroesophageal reflux disease) 08/14/2015  . Hyperlipidemia 08/14/2015  . Vitamin D deficiency 08/14/2015  . Hypothyroidism 05/29/2014  . Cholelithiasis 01/15/2011  . ADENOMATOUS COLONIC POLYP 11/11/2007  . Essential hypertension 11/11/2007  . ESOPHAGEAL STRICTURE 11/11/2007  . Diaphragmatic hernia 11/11/2007  . DIVERTICULOSIS OF COLON 11/11/2007    Past Surgical History:  Procedure Laterality Date  . ABDOMINAL HYSTERECTOMY    . BILATERAL OOPHORECTOMY    . CHOLECYSTECTOMY       OB History   No obstetric history on file.     Family History  Problem Relation Age of Onset  . Colon cancer Sister   . Stroke Mother   .  Heart disease Mother   . Parkinson's disease Father   . Ovarian cancer Sister   . Esophageal cancer Neg Hx   . Stomach cancer Neg Hx   . Kidney disease Neg Hx   . Liver disease Neg Hx   . Diabetes Neg Hx     Social History   Tobacco Use  . Smoking status: Never Smoker  . Smokeless tobacco: Never Used  Substance Use Topics  . Alcohol use: No  . Drug use: No    Home Medications Prior to Admission medications   Medication Sig Start Date End Date Taking? Authorizing Provider  ALPRAZolam (XANAX) 0.25 MG tablet Take 1 tablet (0.25 mg total) by mouth 2 (two) times daily as needed for anxiety. 07/12/19  Yes Hawks, Christy A, FNP  atorvastatin (LIPITOR) 20 MG tablet Take 1 tablet (20 mg total) by mouth daily. 06/23/19  Yes Hawks, Christy A, FNP  Chlorphen-PE-Acetaminophen (NOREL AD PO) Take 1 tablet by mouth as needed.    Yes [provider]  escitalopram (LEXAPRO) 10 MG tablet Take 1 tablet (10 mg total) by mouth daily. 10/25/19  Yes Hawks, Christy A, FNP  fluticasone (FLONASE) 50 MCG/ACT nasal spray Place 1-2 sprays into both nostrils as needed for allergies or rhinitis.   Yes [provider]  irbesartan (AVAPRO) 75 MG tablet Take 1 tablet (75 mg total) by mouth daily. (Please make 6 mos med ckup) 12/28/18  Yes Lenna Gilford, [place]  A, FNP  levothyroxine (SYNTHROID) 75 MCG tablet TAKE (1) TABLET DAILY BE- FORE BREAKFAST. 12/28/18  Yes Hawks, Christy A, FNP  meclizine (ANTIVERT) 25 MG tablet TAKE ONE TABLET BY MOUTH THREE TIMES DAILY AS NEEDED FOR DIZZINESS Patient taking differently: Take 25 mg by mouth 3 (three) times daily as needed for dizziness.  10/11/19  Yes Hawks, Christy A, FNP  pantoprazole (PROTONIX) 20 MG tablet Take 20 mg by mouth 2 (two) times daily. 10/11/19  Yes [provider]    Allergies    Biaxin [clarithromycin], Cephalexin, Ciprofloxacin, Doxycycline, Flagyl [metronidazole hcl], Ketek [telithromycin], and Sulfa antibiotics  Review of Systems   Review  of Systems  Constitutional: Negative for chills and fever.  HENT: Negative for congestion.   Eyes: Negative for visual disturbance.  Respiratory: Negative for shortness of breath.   Cardiovascular: Negative for chest pain.  Gastrointestinal: Negative for abdominal pain and vomiting.  Genitourinary: Negative for flank pain.  Musculoskeletal: Negative for back pain, neck pain and neck stiffness.  Skin: Negative for rash.  Neurological: Negative for weakness, light-headedness, numbness and headaches.  Psychiatric/Behavioral: The patient is nervous/anxious.     Physical Exam Updated Vital Signs BP (!) 157/77   Pulse 82   Temp 98.4 F (36.9 C) (Oral)   Resp 18   Ht 5\' 1"  (1.549 m)   Wt 70 kg   SpO2 99%   BMI 29.16 kg/m   Physical Exam Vitals and nursing note reviewed.  Constitutional:      Appearance: She is well-developed.  HENT:     Head: Normocephalic and atraumatic.  Eyes:     General:        Right eye: No discharge.        Left eye: No discharge.     Conjunctiva/sclera: Conjunctivae normal.  Neck:     Trachea: No tracheal deviation.  Cardiovascular:     Rate and Rhythm: Normal rate and regular rhythm.  Pulmonary:     Effort: Pulmonary effort is normal.     Breath sounds: Normal breath sounds.  Abdominal:     General: There is no distension.     Palpations: Abdomen is soft.     Tenderness: There is no abdominal tenderness. There is no guarding.  Musculoskeletal:     Cervical back: Normal range of motion and neck supple.  Skin:    General: Skin is warm.     Findings: No rash.  Neurological:     Mental Status: She is alert and oriented to person, place, and time.     GCS: GCS eye subscore is 4. GCS verbal subscore is 5. GCS motor subscore is 6.     Comments: Short-term memory difficulty.  Patient anxious on exam.  Patient follows commands.  Extraocular muscle function intact.  Equal 5+ strength upper and lower extremities bilateral.  Sensation intact palpation  bilateral.  Finger-nose intact.     ED Results / Procedures / Treatments   Labs (all labs ordered are listed, but only abnormal results are displayed) Labs Reviewed  COMPREHENSIVE METABOLIC PANEL - Abnormal; Notable for the following components:      Result Value   Creatinine, Ser 1.31 (*)    GFR calc non Af Amer 39 (*)    GFR calc Af Amer 46 (*)    All other components within normal limits  URINALYSIS, ROUTINE W REFLEX MICROSCOPIC - Abnormal; Notable for the following components:   Color, Urine STRAW (*)    Hgb urine dipstick SMALL (*)  Leukocytes,Ua MODERATE (*)    All other components within normal limits  URINE CULTURE  CBC WITH DIFFERENTIAL/PLATELET  TROPONIN I (HIGH SENSITIVITY)  TROPONIN I (HIGH SENSITIVITY)    EKG EKG Interpretation  Date/Time:  Saturday October 29 2019 15:31:42 EST Ventricular Rate:  70 PR Interval:    QRS Duration: 91 QT Interval:  422 QTC Calculation: 456 R Axis:   -58 Text Interpretation: Normal sinus rhythm Left anterior fascicular block Low voltage, precordial leads Borderline T abnormalities, anterior leads Artifact Confirmed by Fredia Sorrow [phone]) on 10/29/2019 4:06:39 PM   Radiology CT Head Wo Contrast  Result Date: 10/29/2019 CLINICAL DATA:  Altered mental status. Dysarthria. EXAM: CT HEAD WITHOUT CONTRAST TECHNIQUE: Contiguous axial images were obtained from the base of the skull through the vertex without intravenous contrast. COMPARISON:  None. FINDINGS: Brain: Mild-to-moderate enlargement of the ventricles and subarachnoid spaces. Moderate patchy white matter low density in both cerebral hemispheres. No intracranial hemorrhage, mass lesion or CT evidence of acute infarction. Vascular: No hyperdense vessel or unexpected calcification. Skull: Normal. Negative for fracture or focal lesion. Sinuses/Orbits: Unremarkable. Other: None. IMPRESSION: 1. No acute abnormality. 2. Mild to moderate cerebral and cerebellar atrophy. 3. Moderate  chronic small vessel white matter ischemic changes in both cerebral hemispheres. Electronically Signed   By: Claudie Revering M.D.   On: 10/29/2019 17:36    Procedures Procedures (including critical care time)  Medications Ordered in ED Medications  LORazepam (ATIVAN) injection 1 mg (has no administration in time range)  alum & mag hydroxide-simeth (MAALOX/MYLANTA) 200-200-20 MG/5ML suspension 30 mL (30 mLs Oral Given 10/29/19 1759)    And  lidocaine (XYLOCAINE) 2 % viscous mouth solution 15 mL (15 mLs Oral Given 10/29/19 1759)    ED Course  I have reviewed the triage vital signs and the nursing notes.  Pertinent labs & imaging results that were available during my care of the patient were reviewed by me and considered in my medical decision making (see chart for details).  Clinical Course as of Oct 29 2155  Sat Oct 29, 2019  1754 Mr. Lesieur states patient has been complaining of headache over the last several days. Decreased appetite over last several days. Intermittent chest pain. States she seemed to act abnormally yesterday around 5 pm. She asked for tylenol twice, but didn't remember this. She typically has great memory. Since last night, she has seemed confused and just "seems off."   [SJ]  [place] with Dr. Lorraine Lax, neurologist.  Agrees with plan for MRI. ED to ED transfer to [facility]. EDP at [facility] can formally consult neurology after MRI, as needed.    [SJ]  [place] with Daleen Bo, EDP. Agrees to accept transfer of patient.   [SJ]    Clinical Course User Index [SJ] Joy, Maceo Pro   MDM Rules/Calculators/A&P                     Patient care transferred for further evaluation from neurology and MRI.  Patient has had intermittent memory difficulty since last night.  Patient has no focal neurologic deficits.  CT scan of the head had no acute findings reviewed.  Urinalysis urine culture added no sign of significant infection.  Paged neurology and MRI brain ordered.  Ativan  ordered  Final Clinical Impression(s) / ED Diagnoses Final diagnoses:  Memory problem    Rx / DC Orders ED Discharge Orders    None       Elnora Morrison, MD 10/29/19  2202  

## 2019-10-29 NOTE — ED Notes (Signed)
Report given to [facility] and to [facility] ED Charge RN.

## 2019-10-29 NOTE — ED Notes (Signed)
Pt returned from CT °

## 2019-10-29 NOTE — ED Provider Notes (Signed)
Medical screening examination/treatment/procedure(s) were conducted as a shared visit with non-physician practitioner(s) and myself.  I personally evaluated the patient during the encounter.  EKG Interpretation  Date/Time:  Saturday October 29 2019 15:31:42 EST Ventricular Rate:  70 PR Interval:    QRS Duration: 91 QT Interval:  422 QTC Calculation: 456 R Axis:   -58 Text Interpretation: Normal sinus rhythm Left anterior fascicular block Low voltage, precordial leads Borderline T abnormalities, anterior leads Artifact Confirmed by Fredia Sorrow [phone]) on 10/29/2019 4:06:39 PM   Results for orders placed or performed during the hospital encounter of 10/29/19  Comprehensive metabolic panel  Result Value Ref Range   Sodium 136 135 - 145 mmol/L   Potassium 3.5 3.5 - 5.1 mmol/L   Chloride 103 98 - 111 mmol/L   CO2 23 22 - 32 mmol/L   Glucose, Bld 86 70 - 99 mg/dL   BUN 9 8 - 23 mg/dL   Creatinine, Ser 1.31 (H) 0.44 - 1.00 mg/dL   Calcium 9.2 8.9 - 10.3 mg/dL   Total Protein 7.9 6.5 - 8.1 g/dL   Albumin 4.1 3.5 - 5.0 g/dL   AST 19 15 - 41 U/L   ALT 11 0 - 44 U/L   Alkaline Phosphatase 73 38 - 126 U/L   Total Bilirubin 1.2 0.3 - 1.2 mg/dL   GFR calc non Af Amer 39 (L) >60 mL/min   GFR calc Af Amer 46 (L) >60 mL/min   Anion gap 10 5 - 15  CBC with Differential  Result Value Ref Range   WBC 9.0 4.0 - 10.5 K/uL   RBC 4.86 3.87 - 5.11 MIL/uL   Hemoglobin 14.1 12.0 - 15.0 g/dL   HCT 44.2 36.0 - 46.0 %   MCV 90.9 80.0 - 100.0 fL   MCH 29.0 26.0 - 34.0 pg   MCHC 31.9 30.0 - 36.0 g/dL   RDW 13.7 11.5 - 15.5 %   Platelets 297 150 - 400 K/uL   nRBC 0.0 0.0 - 0.2 %   Neutrophils Relative % 49 %   Neutro Abs 4.4 1.7 - 7.7 K/uL   Lymphocytes Relative 40 %   Lymphs Abs 3.6 0.7 - 4.0 K/uL   Monocytes Relative 7 %   Monocytes Absolute 0.6 0.1 - 1.0 K/uL   Eosinophils Relative 3 %   Eosinophils Absolute 0.3 0.0 - 0.5 K/uL   Basophils Relative 1 %   Basophils Absolute 0.1 0.0 - 0.1 K/uL    Immature Granulocytes 0 %   Abs Immature Granulocytes 0.02 0.00 - 0.07 K/uL  Urinalysis, Routine w reflex microscopic  Result Value Ref Range   Color, Urine STRAW (A) YELLOW   APPearance CLEAR CLEAR   Specific Gravity, Urine 1.006 1.005 - 1.030   pH 7.0 5.0 - 8.0   Glucose, UA NEGATIVE NEGATIVE mg/dL   Hgb urine dipstick SMALL (A) NEGATIVE   Bilirubin Urine NEGATIVE NEGATIVE   Ketones, ur NEGATIVE NEGATIVE mg/dL   Protein, ur NEGATIVE NEGATIVE mg/dL   Nitrite NEGATIVE NEGATIVE   Leukocytes,Ua MODERATE (A) NEGATIVE   RBC / HPF 0-5 0 - 5 RBC/hpf   WBC, UA 11-20 0 - 5 WBC/hpf   Bacteria, UA NONE SEEN NONE SEEN   Squamous Epithelial / LPF 0-5 0 - 5   CT Head Wo Contrast  Result Date: 10/29/2019 CLINICAL DATA:  Altered mental status. Dysarthria. EXAM: CT HEAD WITHOUT CONTRAST TECHNIQUE: Contiguous axial images were obtained from the base of the skull through the vertex without intravenous  contrast. COMPARISON:  None. FINDINGS: Brain: Mild-to-moderate enlargement of the ventricles and subarachnoid spaces. Moderate patchy white matter low density in both cerebral hemispheres. No intracranial hemorrhage, mass lesion or CT evidence of acute infarction. Vascular: No hyperdense vessel or unexpected calcification. Skull: Normal. Negative for fracture or focal lesion. Sinuses/Orbits: Unremarkable. Other: None. IMPRESSION: 1. No acute abnormality. 2. Mild to moderate cerebral and cerebellar atrophy. 3. Moderate chronic small vessel white matter ischemic changes in both cerebral hemispheres. Electronically Signed   By: Claudie Revering M.D.   On: 10/29/2019 17:36   DG Chest Portable 1 View  Result Date: 10/19/2019 CLINICAL DATA:  Chest pain EXAM: PORTABLE CHEST 1 VIEW COMPARISON:  June 26, 2019 FINDINGS: There is slight left base atelectasis. Lungs elsewhere are clear. Heart size and pulmonary vascularity are normal. No adenopathy. No pneumothorax. Rudimentary cervical ribs noted. IMPRESSION: Left base  atelectasis. Lungs elsewhere clear. Cardiac silhouette within normal limits. Electronically Signed   By: Lowella Grip III M.D.   On: 10/19/2019 11:42    Patient seen by me along with physician assistant.  Patient had some memory issues overnight.  Still seems to have some recollection of events from last night that are missing.  No new medications.  Patient is on Xanax chronically.  Head CT here negative.  Concern would be whether there was some sort of subtle stroke.  Discussed with the neuro hospitalist at [facility].  He recommends that patient get MRI.  And reassessment.  And then contacting them for evaluation.  He did not recommend that patient needed to be admitted based just on the memory complaint.  Is possible if MRI is completely normal and patient seems fine rediscussion with neurology that they could be discharged home.  Patient without any focal neuro deficit other than the some of the memory issues.   Fredia Sorrow, MD 10/29/19 [phone]

## 2019-10-29 NOTE — Discharge Instructions (Addendum)
Schedule follow-up with your primary doctor as soon as possible to have any further testing or referrals performed.  Return to the ER if you have any worsening problems with your memory or confusion.

## 2019-10-30 DIAGNOSIS — R413 Other amnesia: Secondary | ICD-10-CM | POA: Diagnosis not present

## 2019-10-30 DIAGNOSIS — R4182 Altered mental status, unspecified: Secondary | ICD-10-CM | POA: Diagnosis not present

## 2019-10-30 NOTE — ED Notes (Signed)
Pt's short-term memory appears impaired, but she is cognizant of time, place, and event. Modified NIH unremarkable at this time.

## 2019-10-30 NOTE — ED Notes (Signed)
Remaining 35mL of Ativan wasted, [employer] witnessed

## 2019-10-30 NOTE — ED Notes (Signed)
Pt is ambulatory, gait-steady, assisted to bedside commode. Pt was unable to provide repeat urine sample. Fluids offered.

## 2019-10-30 NOTE — ED Provider Notes (Addendum)
Patient signed out to me with MRI pending.  She has been transferred here for urgent MRI to rule out stroke.  MRI has been performed and does not show any acute process.  Upon reexamination, patient is comfortable and resting in her room.  She is at her normal neurologic baseline.  She is without complaints currently.  Findings discussed with her and her family at bedside.  She will be discharged, follow-up with her primary doctor.  She and family are comfortable with this plan.  She has been unable to provide a urine.  She is anxious to leave and does not wish to stay any longer to provide a urine sample, will refer back to primary care for further evaluation.   Orpah Greek, MD 10/30/19 0104    Orpah Greek, MD 10/30/19 [phone]

## 2019-10-30 NOTE — ED Notes (Signed)
Remaining 1mg  of Ativan wasted. Noralyn Pick RN witnessed.

## 2019-10-30 NOTE — ED Notes (Signed)
Patient verbalizes understanding of discharge instructions. Opportunity for questioning and answers were provided. Armband removed by staff, pt discharged from ED.  

## 2019-10-31 ENCOUNTER — Telehealth: Payer: Medicare HMO | Admitting: Cardiology

## 2019-11-01 ENCOUNTER — Other Ambulatory Visit: Payer: Self-pay

## 2019-11-02 ENCOUNTER — Other Ambulatory Visit: Payer: Self-pay

## 2019-11-02 ENCOUNTER — Encounter: Payer: Self-pay | Admitting: Family

## 2019-11-02 ENCOUNTER — Ambulatory Visit (INDEPENDENT_AMBULATORY_CARE_PROVIDER_SITE_OTHER): Payer: Medicare HMO | Admitting: Family

## 2019-11-02 VITALS — BP 114/77 | HR 84 | Temp 95.0°F | Ht 61.0 in | Wt 151.2 lb

## 2019-11-02 DIAGNOSIS — K219 Gastro-esophageal reflux disease without esophagitis: Secondary | ICD-10-CM

## 2019-11-02 DIAGNOSIS — R634 Abnormal weight loss: Secondary | ICD-10-CM | POA: Diagnosis not present

## 2019-11-02 DIAGNOSIS — F411 Generalized anxiety disorder: Secondary | ICD-10-CM | POA: Diagnosis not present

## 2019-11-02 DIAGNOSIS — J301 Allergic rhinitis due to pollen: Secondary | ICD-10-CM | POA: Diagnosis not present

## 2019-11-02 DIAGNOSIS — Z09 Encounter for follow-up examination after completed treatment for conditions other than malignant neoplasm: Secondary | ICD-10-CM

## 2019-11-02 MED ORDER — CETIRIZINE HCL 10 MG PO TABS: 10.0000 mg | ORAL_TABLET | Freq: Every day | ORAL | 11 refills | Status: DC

## 2019-11-02 NOTE — Patient Instructions (Signed)

## 2019-11-02 NOTE — Progress Notes (Signed)
Subjective:    Patient ID: Susan Contreras, female    DOB: 25-Jul-1941, 79 y.o.   MRN: [id number]  Chief Complaint  Patient presents with  . Hospitalization Follow-up    [facility]   Pt presents to the office today for hospital follow up. Pt's anxiety has been increased and having memory changes. She had a MRI that was negative for acute findings. She reports her memory has stable now.   She reports her GERD and has worsen, and has seen GI for this, but they felt this was from her anxiety.   We have tried Lexapro, but she states she took one dose and it made her weak. She has lost 23 lb since 06/17/19.  She reports she is having ear fullness, and globus sensation and wants to see an ENT. She has allergies and takes zyrtec as needed.  Anxiety Presents for follow-up visit. Symptoms include decreased concentration, depressed mood, excessive worry, insomnia, irritability, nervous/anxious behavior and restlessness. Symptoms occur most days. The severity of symptoms is moderate. The quality of sleep is good.    Gastroesophageal Reflux She complains of belching, heartburn and a hoarse voice. This is a chronic problem. The current episode started more than 1 year ago. The problem occurs constantly. The problem has been waxing and waning. The symptoms are aggravated by certain foods. Risk factors include obesity. She has tried a PPI for the symptoms. The treatment provided moderate relief.      Review of Systems  Constitutional: Positive for irritability.  HENT: Positive for hoarse voice.   Gastrointestinal: Positive for heartburn.  Psychiatric/Behavioral: Positive for decreased concentration. The patient is nervous/anxious and has insomnia.   All other systems reviewed and are negative.      Objective:   Physical Exam Vitals reviewed.  Constitutional:      General: She is not in acute distress.    Appearance: She is well-developed.  HENT:     Head: Normocephalic and atraumatic.   Mouth/Throat:     Pharynx: Posterior oropharyngeal erythema present.  Eyes:     Pupils: Pupils are equal, round, and reactive to light.  Neck:     Thyroid: No thyromegaly.  Cardiovascular:     Rate and Rhythm: Normal rate and regular rhythm.     Heart sounds: Normal heart sounds. No murmur.  Pulmonary:     Effort: Pulmonary effort is normal. No respiratory distress.     Breath sounds: Normal breath sounds. No wheezing.  Abdominal:     General: Bowel sounds are normal. There is no distension.     Palpations: Abdomen is soft.     Tenderness: There is no abdominal tenderness.  Musculoskeletal:        General: No tenderness. Normal range of motion.     Cervical back: Normal range of motion and neck supple.  Skin:    General: Skin is warm and dry.  Neurological:     Mental Status: She is alert and oriented to person, place, and time.     Cranial Nerves: No cranial nerve deficit.     Deep Tendon Reflexes: Reflexes are normal and symmetric.  Psychiatric:        Behavior: Behavior normal.        Thought Content: Thought content normal.        Judgment: Judgment normal.       BP 114/77   Pulse 84   Temp (!) 95 F (35 C) (Temporal)   Ht 5\' 1"  (1.549 m)   Wt  151 lb 3.2 oz (68.6 kg)   SpO2 98%   BMI 28.57 kg/m      Assessment & Plan:  Susan Contreras comes in today with chief complaint of Hospitalization Follow-up ([facility])   Diagnosis and orders addressed:  1. GAD (generalized anxiety disorder) - Ambulatory referral to Psychiatry  2. Gastroesophageal reflux disease, unspecified whether esophagitis present - Ambulatory referral to ENT  3. Hospital discharge follow-up  4. Weight loss - Ambulatory referral to ENT  5. Allergic rhinitis due to pollen, unspecified seasonality -Start daily zyrtec, I believe most of her symptoms are coming from post-nasal drip. - cetirizine (ZYRTEC) 10 MG tablet; Take 1 tablet (10 mg total) by mouth daily.  Dispense: 30 tablet; Refill: 11 -  Ambulatory referral to ENT   Labs reviewed from hospital  Health Maintenance reviewed Diet and exercise encouraged  Follow up plan: 3 months    [name], FNP

## 2019-11-03 ENCOUNTER — Telehealth: Payer: Self-pay | Admitting: Family

## 2019-11-03 MED ORDER — TRAZODONE HCL 50 MG PO TABS: 25.0000 mg | ORAL_TABLET | Freq: Every evening | ORAL | 3 refills | Status: DC | PRN

## 2019-11-03 NOTE — Telephone Encounter (Signed)
Pt aware of new RX

## 2019-11-03 NOTE — Telephone Encounter (Signed)
Trazodone 50 mg Prescription sent to pharmacy.

## 2019-11-07 ENCOUNTER — Telehealth: Payer: Self-pay | Admitting: Family

## 2019-11-07 ENCOUNTER — Telehealth: Payer: Self-pay | Admitting: *Deleted

## 2019-11-07 ENCOUNTER — Ambulatory Visit: Payer: Self-pay | Admitting: *Deleted

## 2019-11-07 DIAGNOSIS — F411 Generalized anxiety disorder: Secondary | ICD-10-CM

## 2019-11-07 NOTE — Chronic Care Management (AMB) (Signed)
Received a call today from co worker Laverda Sorenson in reference to pt [phone], states that she called for CCM services and pt stated that she was very sad and depressed was worried that she was not taking her medication correctly. I advised Erline Levine that I was going to reach out to RN [employer] at [place] advised me that pt PCP was not in office today and due to pt state she would give pt number to [place]. I called Erline Levine to inform her, but she stated that pt had gotten off the phone with pt due to her receiving a phone call from [facility] and stated that she would call her back.

## 2019-11-07 NOTE — Chronic Care Management (AMB) (Signed)
  Chronic Care Management   Outreach Note  11/07/2019 Name: Susan Contreras MRN: [id number] DOB: [birth year]  Susan Contreras is a 79 y.o. year old female who is a primary care patient of [name], FNP. I reached out to [name] by phone today in response to a referral sent by Ms. Kimmberly Leas's health plan.     A telephone outreach was attempted today to schedule a telephone outreach for Chronic Care Management with RN Case Manger. I spoke with patient and she said that she is very sad, depressed, does not know if she is taking her medication (Xanax) correctly. Patient stated that she does not want to harm her self or anyone else but that she is just "SAD" and would like to speak to a counselor. Placed patient on brief hold and called Noreene Larsson coworker to get assistance in reaching out to [facility] to have office assist with patients needs. Reconnected with patient to keep her talking with me while Noreene Larsson reached out to [facility], patient continued to explain she was sad and very depressed and needs to speak with a mental health doctor. [place] office beeped in while on the phone with patient advised to not hang up but answer and click back over to let me know if they are calling to help get an appointment with Mental Health. Patient clicked back over to let me know that Florentina Jenny from [facility]  was calling her to discuss options for getting help I gave patient contact information Laverda Sorenson [phone] and told her to call back if she needed me.     Returned call to patient to to see if she called behavior health number given by Florentina Jenny from [facility] and patient ask if I would call with her. We called Crisis line twice together however the phone number was not working and only rang. The patients son arrived at the patients home and is now with patient. I called and spoke with LCSW Theadore Nan at [facility] by phone, provided him with patient contact information and concerns. Nicki Reaper will  call patient and Follow up with her  today to access needs and will make FU call with patient on 11/08/2019. Nicki Reaper will coordinate a FU plan with RN CM Kristen at [facility]  to discuss medications and will FU as needed with [place], [place]  [place],  [id number] Direct Dial: [email or web address] Website: [email or web address]

## 2019-11-07 NOTE — Telephone Encounter (Signed)
  REFERRAL REQUEST Telephone Note 11/07/2019  What type of referral do you need? Psychiatrist, Mental Health for anxiety  Have you been seen at our office for this problem? Yes was discussed at last appt with Alyse Low  (Advise that they may need an appointment with their PCP before a referral can be done)  Is there a particular doctor or location that you prefer? She has been to one in [place], does not remember the name, [facility] is ok   Patient notified that referrals can take up to a week or longer to process. If they haven't heard anything within a week they should call back and speak with the referral department.

## 2019-11-07 NOTE — Telephone Encounter (Signed)
Pt states she though she was getting a referral to Psych the other day when she had her visit with Alyse Low and she thought she was going to get to see Psych soon. Pt really doesn't like taking medications. Offered her an appt with her PCP but she declines stating she only wants a psych appt. On the phone she seems very agitated and states she gets "ill" easy and stays agitated. Gave pt the [facility] [phone] and advised pt to call if she feel she needs to or to call us back if she wants an appt. Spent 30 minutes on the phone with the pt. Pt states she will call the [facility] help line if she thinks she needs to or she will call us back till we can get her in with psych. Pt states she is NOT having any thoughts of harming herself or others. I didn't see a referral in her chart?

## 2019-11-08 ENCOUNTER — Ambulatory Visit: Payer: Self-pay | Admitting: Licensed Clinical Social Worker

## 2019-11-08 ENCOUNTER — Encounter: Payer: Self-pay | Admitting: Family

## 2019-11-08 ENCOUNTER — Ambulatory Visit (INDEPENDENT_AMBULATORY_CARE_PROVIDER_SITE_OTHER): Payer: Medicare HMO | Admitting: Family

## 2019-11-08 DIAGNOSIS — E785 Hyperlipidemia, unspecified: Secondary | ICD-10-CM | POA: Diagnosis not present

## 2019-11-08 DIAGNOSIS — R1310 Dysphagia, unspecified: Secondary | ICD-10-CM | POA: Diagnosis not present

## 2019-11-08 DIAGNOSIS — F411 Generalized anxiety disorder: Secondary | ICD-10-CM

## 2019-11-08 DIAGNOSIS — K219 Gastro-esophageal reflux disease without esophagitis: Secondary | ICD-10-CM

## 2019-11-08 DIAGNOSIS — J019 Acute sinusitis, unspecified: Secondary | ICD-10-CM | POA: Diagnosis not present

## 2019-11-08 DIAGNOSIS — I10 Essential (primary) hypertension: Secondary | ICD-10-CM | POA: Diagnosis not present

## 2019-11-08 DIAGNOSIS — E039 Hypothyroidism, unspecified: Secondary | ICD-10-CM | POA: Diagnosis not present

## 2019-11-08 MED ORDER — AMOXICILLIN 500 MG PO CAPS: 500.0000 mg | ORAL_CAPSULE | Freq: Two times a day (BID) | ORAL | 0 refills | Status: DC

## 2019-11-08 NOTE — Telephone Encounter (Signed)
Another referral placed.

## 2019-11-08 NOTE — Progress Notes (Signed)
   Care Management   Note  11/07/2019 Name: Susan Contreras MRN: [id number] DOB: 06/19/1941  Incoming call from care guide, Noreene Larsson, regarding [name]. Stated that patient was outreached by [place] to discuss our CCM program and patient had complaints of increased sadness/depression and concerns about her medications. Chart reviewed and patient was seen by PCP last week. Psychiatry referral was placed during that visit. Patient also reached out to PCP office this morning and left a message requesting information about the status of the psychiatry referral. I provided Amber with the [facility] 24 hour telephone number as an acute mental health resource and I spoke with Eston Mould, LPN at [facility] who agreed to follow-up with the patient by telephone today regarding her medications and referral. Refer to her telephone note for more information about their telephone call.    I was consulted by Theadore Nan, LCSW and provided him with the previous information. He planned to briefly reach out to the patient today and schedule her for a more thorough telephone call tomorrow.   Follow up plan: RN will remain available to follow-up with the patient as needed   Chong Sicilian, BSN, RN-BC [place] / [place] Management Direct Dial: [phone]

## 2019-11-08 NOTE — Progress Notes (Signed)
Virtual Visit via telephone Note Due to COVID-19 pandemic this visit was conducted virtually. This visit type was conducted due to national recommendations for restrictions regarding the COVID-19 Pandemic (e.g. social distancing, sheltering in place) in an effort to limit this patient's exposure and mitigate transmission in our community. All issues noted in this document were discussed and addressed.  A physical exam was not performed with this format.  I connected with Susan Contreras on 11/08/19 at 12:27 pm by telephone and verified that I am speaking with the correct person using two identifiers. Susan Contreras is currently located at home and no one is currently with her during visit. The provider, [name], FNP is located in their office at time of visit.  I discussed the limitations, risks, security and privacy concerns of performing an evaluation and management service by telephone and the availability of in person appointments. I also discussed with the patient that there may be a patient responsible charge related to this service. The patient expressed understanding and agreed to proceed.   History and Present Illness:  Pt calls the office with with recurrent post nasal drip and feeling in her throat getting choked. She is having bilateral ear pain, headaches, and sinus pressure. She has been taking zyrtec 10 mg daily.    She reports her anxiety is still causing her to insomnia.  Sinusitis This is a new problem. The current episode started 1 to 4 weeks ago. There has been no fever. Her pain is at a severity of 0/10. Associated symptoms include congestion, coughing, ear pain ("some"), headaches, a hoarse voice, sinus pressure and a sore throat.      Review of Systems  HENT: Positive for congestion, ear pain ("some"), hoarse voice, sinus pressure and sore throat.   Respiratory: Positive for cough.   Neurological: Positive for headaches.     Observations/Objective: No SOB or  distress noted, very anxious  Assessment and Plan: 1. Acute sinusitis, recurrence not specified, unspecified location - Take meds as prescribed - Use a cool mist humidifier  -Use saline nose sprays frequently -Force fluids -For any cough or congestion  Use plain Mucinex- regular strength or max strength is fine -For fever or aces or pains- take tylenol or ibuprofen. -Throat lozenges if help -Continue zyrtec daily - amoxicillin (AMOXIL) 500 MG capsule; Take 1 capsule (500 mg total) by mouth 2 (two) times daily.  Dispense: 14 capsule; Refill: 0  2. Dysphagia, unspecified type Will place another referral for patient. She currently has an appointment with ENT, but it is not until 04/17, and would like one sooner. - Ambulatory referral to ENT     I discussed the assessment and treatment plan with the patient. The patient was provided an opportunity to ask questions and all were answered. The patient agreed with the plan and demonstrated an understanding of the instructions.   The patient was advised to call back or seek an in-person evaluation if the symptoms worsen or if the condition fails to improve as anticipated.  The above assessment and management plan was discussed with the patient. The patient verbalized understanding of and has agreed to the management plan. Patient is aware to call the clinic if symptoms persist or worsen. Patient is aware when to return to the clinic for a follow-up visit. Patient educated on when it is appropriate to go to the emergency department.   Time call ended:  12:51 pm  I provided 24 minutes of non-face-to-face time during this encounter.  [name], FNP

## 2019-11-08 NOTE — Patient Instructions (Addendum)
Licensed Clinical Social Worker Visit Information  Goals we discussed today:  Goals Addressed            This Visit's Progress   . Client will talk with LCSW in next 30 days to discuss anxiety /stress issues of concern for patinet at this time (pt-stated)       [place] (see longtitudinal plan of care for additional care plan information)  Current Barriers:  Marland Kitchen Mental health issues of concern (anxiety/stress) in client with Chronic Diagnoses         of HTN, GAD, GERD, HLD and Hypothyroidism . Decreased energy . Transportation challenges.  Clinical Social Work Clinical Goal(s):  Marland Kitchen LCSW will talk with client in next 30 days to discuss client managenet of anxiety or stress issues.   Interventions: . Talked with client about CCM program support services . Talked with client about weight issue of client (she said she lost 23 pounds since Octoboer of 2020) . Talked with Rebeca about [name] PA psychiatric referral for client  . Talked with Marelin about anxiety or stress issues she faces.   .  Talked with Cecili about transport needs of client . Talked with Angelena about her decreased appetite . Talked with client about sleeping challenges . Talked with Rosaland about mucous issues (she said that she talked with [name] PA about this issue)  Collaborated with RNCM regarding nursing needs of client  Provided counseling support for client  Patient Self Care Activities:  Takes medications as prescribed Attends scheduled medical appointments  Patient SELF CARE DEFICITS: Transportation challenges Sleeping challenges Mental health issues  Initial goal documentation        Materials Provided: No   Follow Up Plan: LCSW to call client in next 4 weeks to talk with her about client management of anxiety and stress issues faced by client  The patient verbalized understanding of instructions provided today and declined a print copy of patient instruction materials.    Norva Riffle.Kreig Parson MSW, LCSW Licensed Clinical Social Worker [facility] Care Management [phone]

## 2019-11-08 NOTE — Chronic Care Management (AMB) (Addendum)
Care Management Note   Susan Contreras is a 79 y.o. year old female who is a primary care patient of [name], FNP. The CM team was consulted for assistance with chronic disease management and care coordination.   I reached out to Susan Contreras by phone today.   [name] was given information about Chronic Care Management services today including:  CCM service includes personalized support from designated clinical staff supervised by her physician, including individualized plan of care and coordination with other care providers 24/7 contact phone numbers for assistance for urgent and routine care needs. Service will only be billed when office clinical staff spend 20 minutes or more in a month to coordinate care. Only one practitioner may furnish and bill the service in a calendar month. The patient may stop CCM services at any time (effective at the end of the month) by phone call to the office staff. The patient will be responsible for cost sharing (co-pay) of up to 20% of the service fee (after annual deductible is met). Patient agreed to services and verbal consent obtained.    Review of patient status, including review of consultants reports, relevant laboratory and other test results, and collaboration with appropriate care team members and the patient's provider was performed as part of comprehensive patient evaluation and provision of chronic care management services.   Social Determinants of health: risk of depression; risk of stress    Chronic Care Management from 11/08/2019 in [facility]  PHQ-9 Total Score  8       GAD 7 : Generalized Anxiety Score 11/08/2019 11/02/2019  Nervous, Anxious, on Edge 1 1  Control/stop worrying 1 0  Worry too much - different things 1 0  Trouble relaxing 1 0  Restless 0 0  Easily annoyed or irritable 1 0  Afraid - awful might happen 0 0  Total GAD 7 Score 5 1  Anxiety Difficulty Somewhat difficult -   Medications     ALPRAZolam (XANAX) 0.25 MG tablet amoxicillin (AMOXIL) 500 MG capsule atorvastatin (LIPITOR) 20 MG tablet cetirizine (ZYRTEC) 10 MG tablet Chlorphen-PE-Acetaminophen (NOREL AD PO) fluticasone (FLONASE) 50 MCG/ACT nasal spray irbesartan (AVAPRO) 75 MG tablet levothyroxine (SYNTHROID) 75 MCG tablet meclizine (ANTIVERT) 25 MG tablet pantoprazole (PROTONIX) 20 MG tablet traZODone (DESYREL) 50 MG tablet  Goals Addressed             This Visit's Progress    Client will talk with LCSW in next 30 days to discuss anxiety /stress issues of concern for patinet at this time (pt-stated)       CARE PLAN ENTRY (see longtitudinal plan of care for additional care plan information)  Current Barriers:  Mental health issues of concern (anxiety/stress) in client with Chronic Diagnoses of HTN, GAD, GERD HLD, Hypothooyroidism Decreased energy Transportation challenges.  Clinical Social Work Clinical Goal(s):  LCSW will talk with client in next 30 days to discuss client managenet of anxiety or stress issues.   Interventions: Talked with client about CCM program support services Talked with client about weight issue of client (she said she lost 23 pounds since Octoboer of 2020) Talked with Josefina about Susan Dun PA psychiatric referral for client  Talked with Sharese about anxiety or stress issues she faces.    Talked with Tucker about transport needs of client Talked with Brent about her decreased appetite Talked with client about sleeping challenges Talked with Madoline about mucous issues (she said that she talked with Susan Dun PA about this  issue) Collaborated with RNCM regarding nursing needs of client Provided counseling support for client  Patient Self Care Activities:  Takes medications as prescribed Attends scheduled medical appointments  Patient SELF CARE DEFICITS: Transportation challenges Sleeping challenges Mental health issues  Initial goal documentation         Follow Up Plan: LCSW to call client in next 4 weeks to talk with her about client management of anxiety and stress issues faced by client  Norva Riffle.Monque Haggar MSW, LCSW Licensed Clinical Social Worker [facility] [phone]  I have reviewed and agree with the above  documentation.   Susan Dun, FNP

## 2019-11-09 DIAGNOSIS — E785 Hyperlipidemia, unspecified: Secondary | ICD-10-CM | POA: Diagnosis not present

## 2019-11-09 DIAGNOSIS — E039 Hypothyroidism, unspecified: Secondary | ICD-10-CM | POA: Diagnosis not present

## 2019-11-09 DIAGNOSIS — I10 Essential (primary) hypertension: Secondary | ICD-10-CM

## 2019-11-11 ENCOUNTER — Telehealth: Payer: Self-pay | Admitting: Family

## 2019-11-11 NOTE — Telephone Encounter (Signed)
Patient states that she does not take medication everyday and when she does take it she feels like it doesn't work.  Patient states that she still gets anxious and still cries.  Takes 1/2 tablet when she does take it.

## 2019-11-11 NOTE — Telephone Encounter (Signed)
Pt can wean off the xanax. She can decrease to once a day then decrease to 1/2 tablet daily for a week. Then stop the medication. Keep referral to [facility].

## 2019-11-11 NOTE — Telephone Encounter (Signed)
Patient states that she has had fatigue and just not feeling well since starting alprazolam.

## 2019-11-15 ENCOUNTER — Encounter: Payer: Self-pay | Admitting: Family

## 2019-11-15 ENCOUNTER — Ambulatory Visit (INDEPENDENT_AMBULATORY_CARE_PROVIDER_SITE_OTHER): Payer: Medicare HMO | Admitting: Family

## 2019-11-15 ENCOUNTER — Telehealth: Payer: Self-pay | Admitting: Family

## 2019-11-15 ENCOUNTER — Other Ambulatory Visit: Payer: Self-pay

## 2019-11-15 VITALS — BP 136/79 | HR 102 | Temp 96.0°F | Ht 61.0 in | Wt 150.0 lb

## 2019-11-15 DIAGNOSIS — K219 Gastro-esophageal reflux disease without esophagitis: Secondary | ICD-10-CM

## 2019-11-15 DIAGNOSIS — R1013 Epigastric pain: Secondary | ICD-10-CM

## 2019-11-15 DIAGNOSIS — F411 Generalized anxiety disorder: Secondary | ICD-10-CM | POA: Diagnosis not present

## 2019-11-15 DIAGNOSIS — R531 Weakness: Secondary | ICD-10-CM

## 2019-11-15 DIAGNOSIS — R634 Abnormal weight loss: Secondary | ICD-10-CM | POA: Diagnosis not present

## 2019-11-15 NOTE — Telephone Encounter (Signed)
Ok, she has an appt with ENT this Friday and I placed a referral to GI. She can discuss this with both of them.

## 2019-11-15 NOTE — Progress Notes (Signed)
Subjective:    Patient ID: Susan Contreras, female    DOB: June 29, 1941, 79 y.o.   MRN: [id number]  Chief Complaint  Patient presents with  . Gastroesophageal Reflux   PT presents to the office with recurrent GERD. She is currently taking Protonix 20 mg BID. She has been on dexliant in the past, but it was too expensive.   She has a follow up with ENT on 11/18/19.   She has seen GI and was told they believed most of her symptoms were from GAD. She has tried Lexapro for one day and states she could not tolerate it. A referral to [facility] was placed, but she has not scheduled an appointment yet.  Gastroesophageal Reflux She complains of belching, dysphagia, globus sensation and heartburn. She reports no coughing or no early satiety. This is a chronic problem. The current episode started more than 1 year ago. The problem occurs constantly. The problem has been waxing and waning. She has tried a PPI for the symptoms. The treatment provided mild relief.      Review of Systems  Respiratory: Negative for cough.   Gastrointestinal: Positive for dysphagia and heartburn.  All other systems reviewed and are negative.      Objective:   Physical Exam Vitals reviewed.  Constitutional:      General: She is not in acute distress.    Appearance: She is well-developed.  HENT:     Head: Normocephalic and atraumatic.  Eyes:     Pupils: Pupils are equal, round, and reactive to light.  Neck:     Thyroid: No thyromegaly.  Cardiovascular:     Rate and Rhythm: Normal rate and regular rhythm.     Heart sounds: Normal heart sounds. No murmur.  Pulmonary:     Effort: Pulmonary effort is normal. No respiratory distress.     Breath sounds: Normal breath sounds. No wheezing.  Abdominal:     General: Bowel sounds are normal. There is no distension.     Palpations: Abdomen is soft.     Tenderness: There is no abdominal tenderness.  Musculoskeletal:        General: No tenderness. Normal range of  motion.     Cervical back: Normal range of motion and neck supple.  Skin:    General: Skin is warm and dry.  Neurological:     Mental Status: She is alert and oriented to person, place, and time.     Cranial Nerves: No cranial nerve deficit.     Deep Tendon Reflexes: Reflexes are normal and symmetric.  Psychiatric:        Mood and Affect: Mood is anxious.        Behavior: Behavior normal.        Thought Content: Thought content normal.        Judgment: Judgment normal.          BP 136/79   Pulse (!) 102   Temp (!) 96 F (35.6 C) (Temporal)   Ht 5' 1" (1.549 m)   Wt 150 lb (68 kg)   SpO2 98%   BMI 28.34 kg/m   Assessment & Plan:  Audrinna Sherman comes in today with chief complaint of Gastroesophageal Reflux   Diagnosis and orders addressed:  1. Epigastric pain - CMP14+EGFR - CBC with Differential/Platelet - H Pylori, IGM, IGG, IGA AB - Ambulatory referral to Gastroenterology  2. Gastroesophageal reflux disease without esophagitis - CMP14+EGFR - CBC with Differential/Platelet - H Pylori, IGM, IGG, IGA AB -  Ambulatory referral to Gastroenterology  3. GAD (generalized anxiety disorder) - CMP14+EGFR - CBC with Differential/Platelet - H Pylori, IGM, IGG, IGA AB  4. Weight loss - Ambulatory referral to Gastroenterology  5. Weakness - Ambulatory referral to Gastroenterology   Pt does not wish to change Prontonix at this time as she just got this refilled. I have placed another referral to GI per her request.   She does not wish to start on any anxiety medications until she see's [facility]. Number given for her to make appointment.   [name], FNP

## 2019-11-15 NOTE — Patient Instructions (Signed)
Gastroesophageal Reflux Disease, Adult Gastroesophageal reflux (GER) happens when acid from the stomach flows up into the tube that connects the mouth and the stomach (esophagus). Normally, food travels down the esophagus and stays in the stomach to be digested. However, when a person has GER, food and stomach acid sometimes move back up into the esophagus. If this becomes a more serious problem, the person may be diagnosed with a disease called gastroesophageal reflux disease (GERD). GERD occurs when the reflux:  Happens often.  Causes frequent or severe symptoms.  Causes problems such as damage to the esophagus. When stomach acid comes in contact with the esophagus, the acid may cause soreness (inflammation) in the esophagus. Over time, GERD may create small holes (ulcers) in the lining of the esophagus. What are the causes? This condition is caused by a problem with the muscle between the esophagus and the stomach (lower esophageal sphincter, or LES). Normally, the LES muscle closes after food passes through the esophagus to the stomach. When the LES is weakened or abnormal, it does not close properly, and that allows food and stomach acid to go back up into the esophagus. The LES can be weakened by certain dietary substances, medicines, and medical conditions, including:  Tobacco use.  Pregnancy.  Having a hiatal hernia.  Alcohol use.  Certain foods and beverages, such as coffee, chocolate, onions, and peppermint. What increases the risk? You are more likely to develop this condition if you:  Have an increased body weight.  Have a connective tissue disorder.  Use NSAID medicines. What are the signs or symptoms? Symptoms of this condition include:  Heartburn.  Difficult or painful swallowing.  The feeling of having a lump in the throat.  Abitter taste in the mouth.  Bad breath.  Having a large amount of saliva.  Having an upset or bloated  stomach.  Belching.  Chest pain. Different conditions can cause chest pain. Make sure you see your health care provider if you experience chest pain.  Shortness of breath or wheezing.  Ongoing (chronic) cough or a night-time cough.  Wearing away of tooth enamel.  Weight loss. How is this diagnosed? Your health care provider will take a medical history and perform a physical exam. To determine if you have mild or severe GERD, your health care provider may also monitor how you respond to treatment. You may also have tests, including:  A test to examine your stomach and esophagus with a small camera (endoscopy).  A test thatmeasures the acidity level in your esophagus.  A test thatmeasures how much pressure is on your esophagus.  A barium swallow or modified barium swallow test to show the shape, size, and functioning of your esophagus. How is this treated? The goal of treatment is to help relieve your symptoms and to prevent complications. Treatment for this condition may vary depending on how severe your symptoms are. Your health care provider may recommend:  Changes to your diet.  Medicine.  Surgery. Follow these instructions at home: Eating and drinking   Follow a diet as recommended by your health care provider. This may involve avoiding foods and drinks such as: ? Coffee and tea (with or without caffeine). ? Drinks that containalcohol. ? Energy drinks and sports drinks. ? Carbonated drinks or sodas. ? Chocolate and cocoa. ? Peppermint and mint flavorings. ? Garlic and onions. ? Horseradish. ? Spicy and acidic foods, including peppers, chili powder, curry powder, vinegar, hot sauces, and barbecue sauce. ? Citrus fruit juices and citrus   fruits, such as oranges, lemons, and limes. ? Tomato-based foods, such as red sauce, chili, salsa, and pizza with red sauce. ? Fried and fatty foods, such as donuts, french fries, potato chips, and high-fat dressings. ? High-fat  meats, such as hot dogs and fatty cuts of red and white meats, such as rib eye steak, sausage, ham, and bacon. ? High-fat dairy items, such as whole milk, butter, and cream cheese.  Eat small, frequent meals instead of large meals.  Avoid drinking large amounts of liquid with your meals.  Avoid eating meals during the 2-3 hours before bedtime.  Avoid lying down right after you eat.  Do not exercise right after you eat. Lifestyle   Do not use any products that contain nicotine or tobacco, such as cigarettes, e-cigarettes, and chewing tobacco. If you need help quitting, ask your health care provider.  Try to reduce your stress by using methods such as yoga or meditation. If you need help reducing stress, ask your health care provider.  If you are overweight, reduce your weight to an amount that is healthy for you. Ask your health care provider for guidance about a safe weight loss goal. General instructions  Pay attention to any changes in your symptoms.  Take over-the-counter and prescription medicines only as told by your health care provider. Do not take aspirin, ibuprofen, or other NSAIDs unless your health care provider told you to do so.  Wear loose-fitting clothing. Do not wear anything tight around your waist that causes pressure on your abdomen.  Raise (elevate) the head of your bed about 6 inches (15 cm).  Avoid bending over if this makes your symptoms worse.  Keep all follow-up visits as told by your health care provider. This is important. Contact a health care provider if:  You have: ? New symptoms. ? Unexplained weight loss. ? Difficulty swallowing or it hurts to swallow. ? Wheezing or a persistent cough. ? A hoarse voice.  Your symptoms do not improve with treatment. Get help right away if you:  Have pain in your arms, neck, jaw, teeth, or back.  Feel sweaty, dizzy, or light-headed.  Have chest pain or shortness of breath.  Vomit and your vomit looks  like blood or coffee grounds.  Faint.  Have stool that is bloody or black.  Cannot swallow, drink, or eat. Summary  Gastroesophageal reflux happens when acid from the stomach flows up into the esophagus. GERD is a disease in which the reflux happens often, causes frequent or severe symptoms, or causes problems such as damage to the esophagus.  Treatment for this condition may vary depending on how severe your symptoms are. Your health care provider may recommend diet and lifestyle changes, medicine, or surgery.  Contact a health care provider if you have new or worsening symptoms.  Take over-the-counter and prescription medicines only as told by your health care provider. Do not take aspirin, ibuprofen, or other NSAIDs unless your health care provider told you to do so.  Keep all follow-up visits as told by your health care provider. This is important. This information is not intended to replace advice given to you by your health care provider. Make sure you discuss any questions you have with your health care provider. Document Revised: 02/17/2018 Document Reviewed: 02/17/2018 Elsevier Patient Education  2020 [employer]  

## 2019-11-16 LAB — CBC WITH DIFFERENTIAL/PLATELET
Basophils Absolute: 0.1 10*3/uL (ref 0.0–0.2)
Basos: 1 %
EOS (ABSOLUTE): 0.3 10*3/uL (ref 0.0–0.4)
Eos: 3 %
Hematocrit: 42.7 % (ref 34.0–46.6)
Hemoglobin: 14.6 g/dL (ref 11.1–15.9)
Immature Grans (Abs): 0 10*3/uL (ref 0.0–0.1)
Immature Granulocytes: 0 %
Lymphocytes Absolute: 3 10*3/uL (ref 0.7–3.1)
Lymphs: 33 %
MCH: 29.8 pg (ref 26.6–33.0)
MCHC: 34.2 g/dL (ref 31.5–35.7)
MCV: 87 fL (ref 79–97)
Monocytes Absolute: 0.8 10*3/uL (ref 0.1–0.9)
Monocytes: 9 %
Neutrophils Absolute: 4.9 10*3/uL (ref 1.4–7.0)
Neutrophils: 54 %
Platelets: 334 10*3/uL (ref 150–450)
RBC: 4.9 x10E6/uL (ref 3.77–5.28)
RDW: 13.6 % (ref 11.7–15.4)
WBC: 9 10*3/uL (ref 3.4–10.8)

## 2019-11-16 LAB — CMP14+EGFR
ALT: 10 IU/L (ref 0–32)
AST: 15 IU/L (ref 0–40)
Albumin/Globulin Ratio: 1.7 (ref 1.2–2.2)
Albumin: 4.6 g/dL (ref 3.7–4.7)
Alkaline Phosphatase: 97 IU/L (ref 39–117)
BUN/Creatinine Ratio: 6 — ABNORMAL LOW (ref 12–28)
BUN: 9 mg/dL (ref 8–27)
Bilirubin Total: 0.8 mg/dL (ref 0.0–1.2)
CO2: 22 mmol/L (ref 20–29)
Calcium: 10.1 mg/dL (ref 8.7–10.3)
Chloride: 99 mmol/L (ref 96–106)
Creatinine, Ser: 1.45 mg/dL — ABNORMAL HIGH (ref 0.57–1.00)
GFR calc Af Amer: 40 mL/min/{1.73_m2} — ABNORMAL LOW (ref >59)
GFR calc non Af Amer: 35 mL/min/{1.73_m2} — ABNORMAL LOW (ref >59)
Globulin, Total: 2.7 g/dL (ref 1.5–4.5)
Glucose: 93 mg/dL (ref 65–99)
Potassium: 4.7 mmol/L (ref 3.5–5.2)
Sodium: 137 mmol/L (ref 134–144)
Total Protein: 7.3 g/dL (ref 6.0–8.5)

## 2019-11-16 LAB — H PYLORI, IGM, IGG, IGA AB
H pylori, IgM Abs: <9 units (ref 0.0–8.9)
H. pylori, IgA Abs: <9 units (ref 0.0–8.9)
H. pylori, IgG AbS: 0.4 Index Value (ref 0.00–0.79)

## 2019-11-18 ENCOUNTER — Telehealth: Payer: Self-pay | Admitting: Family

## 2019-11-18 NOTE — Telephone Encounter (Signed)
Please review labs. 

## 2019-11-18 NOTE — Telephone Encounter (Signed)
Pt called wanting to speak with nurse to go over her lab results.

## 2019-11-18 NOTE — Telephone Encounter (Signed)
In lab results

## 2019-11-22 ENCOUNTER — Ambulatory Visit: Payer: Medicare HMO | Admitting: Physician Assistant

## 2019-11-23 ENCOUNTER — Telehealth: Payer: Self-pay | Admitting: Family

## 2019-11-24 NOTE — Telephone Encounter (Signed)
Aware. Must have lab work to check levels.

## 2019-11-25 ENCOUNTER — Other Ambulatory Visit: Payer: Self-pay | Admitting: Family

## 2019-11-25 DIAGNOSIS — I10 Essential (primary) hypertension: Secondary | ICD-10-CM

## 2019-11-29 ENCOUNTER — Encounter: Payer: Self-pay | Admitting: Family

## 2019-11-29 ENCOUNTER — Ambulatory Visit (INDEPENDENT_AMBULATORY_CARE_PROVIDER_SITE_OTHER): Payer: Medicare HMO | Admitting: Family

## 2019-11-29 ENCOUNTER — Other Ambulatory Visit: Payer: Self-pay

## 2019-11-29 DIAGNOSIS — K2100 Gastro-esophageal reflux disease with esophagitis, without bleeding: Secondary | ICD-10-CM | POA: Diagnosis not present

## 2019-11-29 DIAGNOSIS — R531 Weakness: Secondary | ICD-10-CM | POA: Diagnosis not present

## 2019-11-29 DIAGNOSIS — R630 Anorexia: Secondary | ICD-10-CM

## 2019-11-29 DIAGNOSIS — E039 Hypothyroidism, unspecified: Secondary | ICD-10-CM | POA: Diagnosis not present

## 2019-11-29 DIAGNOSIS — R6889 Other general symptoms and signs: Secondary | ICD-10-CM | POA: Diagnosis not present

## 2019-11-29 NOTE — Addendum Note (Signed)
Addended by: Earlene Plater on: 11/29/2019 11:31 AM   Modules accepted: Orders

## 2019-11-29 NOTE — Progress Notes (Signed)
   Virtual Visit via telephone Note Due to COVID-19 pandemic this visit was conducted virtually. This visit type was conducted due to national recommendations for restrictions regarding the COVID-19 Pandemic (e.g. social distancing, sheltering in place) in an effort to limit this patient's exposure and mitigate transmission in our community. All issues noted in this document were discussed and addressed.  A physical exam was not performed with this format.  I connected with Susan Contreras on 11/29/19 at 9:48 AM by telephone and verified that I am speaking with the correct person using two identifiers. Susan Contreras is currently located at home and no one is currently with her during visit. The provider, [name], FNP is located in their office at time of visit.  I discussed the limitations, risks, security and privacy concerns of performing an evaluation and management service by telephone and the availability of in person appointments. I also discussed with the patient that there may be a patient responsible charge related to this service. The patient expressed understanding and agreed to proceed.   History and Present Illness:  Pt calls the office today with complaints with decreased appetite, weakness, and weight loss. She has a referral pending to ENT and has appointment 12/05/19. She has uncontrolled GAD, but can not tolerate "any medications other than xanax". A referral to [facility] with 12/28/19.  Thyroid Problem Presents for follow-up visit. Symptoms include depressed mood, fatigue and hair loss. The symptoms have been worsening.  Gastroesophageal Reflux She complains of choking and heartburn. This is a chronic problem. Associated symptoms include fatigue.      Review of Systems  Constitutional: Positive for fatigue.  Respiratory: Positive for choking.   Gastrointestinal: Positive for heartburn.     Observations/Objective: No SOB or distress noted, very  anxious  Assessment and Plan: Susan Contreras comes in today with chief complaint of No chief complaint on file.   Diagnosis and orders addressed:  1. Decreased appetite - Anemia Profile B; Future - TSH; Future  2. Weakness - Anemia Profile B; Future - TSH; Future  3. Hypothyroidism, unspecified type - Anemia Profile B; Future - TSH; Future  4. Gastroesophageal reflux disease with esophagitis, unspecified whether hemorrhage - Anemia Profile B; Future - TSH; Future   Labs pending Health Maintenance reviewed Diet and exercise encouraged  Follow up plan: Keep follow up with ENT and Behavorial health, she will call GI and make appt       I discussed the assessment and treatment plan with the patient. The patient was provided an opportunity to ask questions and all were answered. The patient agreed with the plan and demonstrated an understanding of the instructions.   The patient was advised to call back or seek an in-person evaluation if the symptoms worsen or if the condition fails to improve as anticipated.  The above assessment and management plan was discussed with the patient. The patient verbalized understanding of and has agreed to the management plan. Patient is aware to call the clinic if symptoms persist or worsen. Patient is aware when to return to the clinic for a follow-up visit. Patient educated on when it is appropriate to go to the emergency department.   Time call ended:  10:10 AM  I provided 22 minutes of non-face-to-face time during this encounter.    [name], FNP

## 2019-11-30 LAB — ANEMIA PROFILE B
Basophils Absolute: 0.1 10*3/uL (ref 0.0–0.2)
Basos: 1 %
EOS (ABSOLUTE): 0.3 10*3/uL (ref 0.0–0.4)
Eos: 3 %
Ferritin: 65 ng/mL (ref 15–150)
Folate: 5.4 ng/mL (ref >3.0)
Hematocrit: 42.2 % (ref 34.0–46.6)
Hemoglobin: 14 g/dL (ref 11.1–15.9)
Immature Grans (Abs): 0 10*3/uL (ref 0.0–0.1)
Immature Granulocytes: 0 %
Iron Saturation: 19 % (ref 15–55)
Iron: 50 ug/dL (ref 27–139)
Lymphocytes Absolute: 3.2 10*3/uL — ABNORMAL HIGH (ref 0.7–3.1)
Lymphs: 33 %
MCH: 29.4 pg (ref 26.6–33.0)
MCHC: 33.2 g/dL (ref 31.5–35.7)
MCV: 89 fL (ref 79–97)
Monocytes Absolute: 0.7 10*3/uL (ref 0.1–0.9)
Monocytes: 7 %
Neutrophils Absolute: 5.5 10*3/uL (ref 1.4–7.0)
Neutrophils: 56 %
Platelets: 329 10*3/uL (ref 150–450)
RBC: 4.77 x10E6/uL (ref 3.77–5.28)
RDW: 13.8 % (ref 11.7–15.4)
Retic Ct Pct: 1.1 % (ref 0.6–2.6)
Total Iron Binding Capacity: 268 ug/dL (ref 250–450)
UIBC: 218 ug/dL (ref 118–369)
Vitamin B-12: 285 pg/mL (ref 232–1245)
WBC: 9.7 10*3/uL (ref 3.4–10.8)

## 2019-11-30 LAB — TSH: TSH: 0.37 u[IU]/mL — ABNORMAL LOW (ref 0.450–4.500)

## 2019-12-01 ENCOUNTER — Telehealth: Payer: Self-pay | Admitting: Family

## 2019-12-01 NOTE — Telephone Encounter (Signed)
Requesting lab results. Please review.

## 2019-12-02 ENCOUNTER — Telehealth: Payer: Self-pay | Admitting: Family

## 2019-12-02 MED ORDER — LEVOTHYROXINE SODIUM 50 MCG PO TABS: 50.0000 ug | ORAL_TABLET | Freq: Every day | ORAL | 3 refills | Status: DC

## 2019-12-02 NOTE — Telephone Encounter (Signed)
Pt called stating that she is very concerned about her lab results. Says she talked to a nurse yesterday who said that her lab results didn't look right and that she may be taking too much medicine. Susan Contreras they would get Susan Contreras to review lab results. Pt says she saw Susan Contreras notes for lab results and does not agree with them. Says no one has called her back either. Pt is very upset and irritated and wants to speak with [facility] asap.

## 2019-12-02 NOTE — Telephone Encounter (Signed)
Levothyroxine decreased to 50 mcg from 75 mcg

## 2019-12-02 NOTE — Telephone Encounter (Signed)
Patient aware.

## 2019-12-05 ENCOUNTER — Other Ambulatory Visit: Payer: Self-pay | Admitting: Family

## 2019-12-05 ENCOUNTER — Ambulatory Visit (INDEPENDENT_AMBULATORY_CARE_PROVIDER_SITE_OTHER): Payer: Medicare HMO | Admitting: *Deleted

## 2019-12-05 DIAGNOSIS — E039 Hypothyroidism, unspecified: Secondary | ICD-10-CM

## 2019-12-05 DIAGNOSIS — R0989 Other specified symptoms and signs involving the circulatory and respiratory systems: Secondary | ICD-10-CM | POA: Diagnosis not present

## 2019-12-05 DIAGNOSIS — K2100 Gastro-esophageal reflux disease with esophagitis, without bleeding: Secondary | ICD-10-CM

## 2019-12-05 DIAGNOSIS — F411 Generalized anxiety disorder: Secondary | ICD-10-CM

## 2019-12-05 DIAGNOSIS — K219 Gastro-esophageal reflux disease without esophagitis: Secondary | ICD-10-CM | POA: Diagnosis not present

## 2019-12-05 DIAGNOSIS — J302 Other seasonal allergic rhinitis: Secondary | ICD-10-CM | POA: Diagnosis not present

## 2019-12-05 NOTE — Chronic Care Management (AMB) (Addendum)
Chronic Care Management   RN Initial Visit Note   12/05/2019 Name: Susan Contreras MRN: [id number] DOB: November 01, 1940  Referred by: [name], FNP Reason for referral : Chronic Care Management (RN Initial Visit)   Susan Contreras is a 79 y.o. year old female who is a primary care patient of [name], FNP. The CCM team was consulted for assistance with chronic disease management and care coordination needs.    Review of patient status, including review of consultants reports, relevant laboratory and other test results, and collaboration with appropriate care team members and the patient's provider was performed as part of comprehensive patient evaluation and provision of chronic care management services.    Subjective: I spoke with [name] by telephone today regarding management of her chronic medical conditions. She has talked with Theadore Nan, LCSW regarding psychosocial issues and has an appointment scheduled with him for tomorrow. She is also scheduled with a Psychiatrist next month regarding her anxiety and depression.   SDOH (Social Determinants of Health) assessments performed: Yes See Care Plan activities for detailed interventions related to SDOH)  SDOH Interventions      Most Recent Value  SDOH Interventions  SDOH Interventions for the Following Domains  Stress  Stress Interventions  Provide Counseling  Depression Interventions/Treatment   Medication, Referral to Psychiatry [appt with psych on 12/27/19]       Objective: Outpatient Encounter Medications as of 12/05/2019  Medication Sig   atorvastatin (LIPITOR) 20 MG tablet Take 1 tablet (20 mg total) by mouth daily.   cetirizine (ZYRTEC) 10 MG tablet Take 1 tablet (10 mg total) by mouth daily.   Chlorphen-PE-Acetaminophen (NOREL AD PO) Take 1 tablet by mouth as needed.    fluticasone (FLONASE) 50 MCG/ACT nasal spray Place 1-2 sprays into both nostrils as needed for allergies or rhinitis.   irbesartan (AVAPRO) 75 MG  tablet TAKE 1 TABLET DAILY   levothyroxine (SYNTHROID) 50 MCG tablet Take 1 tablet (50 mcg total) by mouth daily.   meclizine (ANTIVERT) 25 MG tablet TAKE ONE TABLET BY MOUTH THREE TIMES DAILY AS NEEDED FOR DIZZINESS (Patient taking differently: Take 25 mg by mouth 3 (three) times daily as needed for dizziness. )   pantoprazole (PROTONIX) 20 MG tablet Take 20 mg by mouth 2 (two) times daily.   traZODone (DESYREL) 50 MG tablet Take 0.5-1 tablets (25-50 mg total) by mouth at bedtime as needed for sleep. (Patient not taking: Reported on 11/15/2019)   No facility-administered encounter medications on file as of 12/05/2019.     Lab Results  Component Value Date   TSH 0.370 (L) 11/29/2019   Wt Readings from Last 3 Encounters:  11/15/19 150 lb (68 kg)  11/02/19 151 lb 3.2 oz (68.6 kg)  10/29/19 154 lb 5.2 oz (70 kg)     RN Care Plan     Hypothyroidism (pt-stated)       CARE PLAN ENTRY (see longitudinal plan of care for additional care plan information)  Current Barriers:  Chronic Disease Management support and education needs related to hypothyroidism  Nurse Case Manager Clinical Goal(s):  Over the next 7 weeks, patient will return to clinic to have TSH level drawn Over the next 60 days, patient will work with PCP to adjust thyroid medication as needed  Interventions:  Inter-disciplinary care team collaboration (see longitudinal plan of care) Evaluation of current treatment plan related to hypothyroidism and patient's adherence to plan as established by provider. Reviewed medications with patient and discussed change from levothyroxine  6mcg to 2mcg Discussed plans with patient for ongoing care management follow up and provided patient with direct contact information for care management team Discussed patient reported weight loss, anxiety, and low energy level Placed future order for TSH to be drawn on or around 01/12/20 Scheduled lab visit for 01/12/20 Provided patient with RN CM  contact number and encouraged to reach out as needed Encouraged patient to reach out to PCP with any new or worsening symptoms  Patient Self Care Activities:  Performs ADL's independently Performs IADL's independently  Initial goal documentation      Elevated Kidney Functions       CARE PLAN ENTRY (see longitudinal plan of care for additional care plan information)  Current Barriers:  Chronic Disease Management support and education needs related to chronically elevated creatinine without a diagnosis of CKD  Nurse Case Manager Clinical Goal(s):  Over the next 60 days, patient will work with PCP regarding elevated kidney function levels  Interventions:  Inter-disciplinary care team collaboration (see longitudinal plan of care) Chart reviewed including recent office notes and lab results Creatinine has been elevated since at least 2018 No diagnosis of CKD Medications reviewed No NSAID use Staff message sent to PCP to see if this needs further exploration  Patient Self Care Activities:  Performs ADL's independently Performs IADL's independently  Initial goal documentation      GERD       CARE PLAN ENTRY (see longitudinal plan of care for additional care plan information)  Current Barriers:  Chronic Disease Management support and education needs related to GERD  Nurse Case Manager Clinical Goal(s):  Over the next 30 days, patient will see GI specialist  Interventions:  Inter-disciplinary care team collaboration (see longitudinal plan of care) Chart reviewed including recent office notes and lab results Neg H.Pylori Referral to Dr Laural Golden was denied b/c she is established with [place]. Patient requested to switch to Dr Jake Church with [facility] Referral coordinator Advised patient to call Dr Olevia Perches office at [phone] and schedule an appointment and explain that she wanting to switch providers Discussed GERD symptoms and patient report weight loss of 35 since  Jan 2021 Strongly encouraged patient to see GI Evaluation of current treatment plan related to GERD and patient's adherence to plan as established by provider. Reviewed medications with patient and discussed pantoprazole Discussed plans with patient for ongoing care management follow up and provided patient with direct contact information for care management team  Patient Self Care Activities:  Performs ADL's independently Performs IADL's independently   Initial goal documentation          Follow-up Plan:   The care management team will reach out to the patient again over the next 30 days.    Chong Sicilian, BSN, RN-BC Embedded Chronic Care Manager [facility] Direct Dial: [phone]   I have reviewed and agree with the above documentation.   [name], FNP

## 2019-12-05 NOTE — Patient Instructions (Signed)
Visit Information  Goals Addressed            This Visit's Progress     Patient Stated   . Hypothyroidism (pt-stated)       CARE PLAN ENTRY (see longitudinal plan of care for additional care plan information)  Current Barriers:  . Chronic Disease Management support and education needs related to hypothyroidism  Nurse Case Manager Clinical Goal(s):  Marland Kitchen Over the next 7 weeks, patient will return to clinic to have TSH level drawn . Over the next 60 days, patient will work with PCP to adjust thyroid medication as needed  Interventions:  . Inter-disciplinary care team collaboration (see longitudinal plan of care) . Evaluation of current treatment plan related to hypothyroidism and patient's adherence to plan as established by provider. . Reviewed medications with patient and discussed change from levothyroxine 69mcg to 26mcg . Discussed plans with patient for ongoing care management follow up and provided patient with direct contact information for care management team . Discussed patient reported weight loss, anxiety, and low energy level . Placed future order for TSH to be drawn on or around 01/12/20 . Scheduled lab visit for 01/12/20 . Provided patient with RN CM contact number and encouraged to reach out as needed . Encouraged patient to reach out to PCP with any new or worsening symptoms  Patient Self Care Activities:  . Performs ADL's independently . Performs IADL's independently  Initial goal documentation       Other   . Elevated Kidney Functions       CARE PLAN ENTRY (see longitudinal plan of care for additional care plan information)  Current Barriers:  . Chronic Disease Management support and education needs related to chronically elevated creatinine without a diagnosis of CKD  Nurse Case Manager Clinical Goal(s):  Marland Kitchen Over the next 60 days, patient will work with PCP regarding elevated kidney function levels  Interventions:  . Inter-disciplinary care team  collaboration (see longitudinal plan of care) . Chart reviewed including recent office notes and lab results o Creatinine has been elevated since at least 2018 o No diagnosis of CKD . Medications reviewed o No NSAID use . Staff message sent to PCP to see if this needs further exploration  Patient Self Care Activities:  . Performs ADL's independently . Performs IADL's independently  Initial goal documentation     . GERD       CARE PLAN ENTRY (see longitudinal plan of care for additional care plan information)  Current Barriers:  . Chronic Disease Management support and education needs related to GERD  Nurse Case Manager Clinical Goal(s):  Marland Kitchen Over the next 30 days, patient will see GI specialist  Interventions:  . Inter-disciplinary care team collaboration (see longitudinal plan of care) . Chart reviewed including recent office notes and lab results o Neg H.Pylori o Referral to Dr Laural Golden was denied b/c she is established with [place]. Patient requested to switch to Dr Laural Golden . Collaborated with [facility] Referral coordinator . Advised patient to call Dr Olevia Perches office at [phone] and schedule an appointment and explain that she wanting to switch providers . Discussed GERD symptoms and patient report weight loss of 35 since Jan 2021 . Strongly encouraged patient to see GI . Evaluation of current treatment plan related to GERD and patient's adherence to plan as established by provider. . Reviewed medications with patient and discussed pantoprazole . Discussed plans with patient for ongoing care management follow up and provided patient with direct contact information for care management  team  Patient Self Care Activities:  . Performs ADL's independently . Performs IADL's independently   Initial goal documentation        The patient verbalized understanding of instructions provided today and declined a print copy of patient instruction materials.   Follow-up Plan The  care management team will reach out to the patient again over the next 30 days.   Chong Sicilian, BSN, RN-BC Embedded Chronic Care Manager [facility] [place] [facility] Direct Dial: [phone]

## 2019-12-06 ENCOUNTER — Ambulatory Visit: Payer: Self-pay | Admitting: Licensed Clinical Social Worker

## 2019-12-06 DIAGNOSIS — I10 Essential (primary) hypertension: Secondary | ICD-10-CM | POA: Diagnosis not present

## 2019-12-06 DIAGNOSIS — E039 Hypothyroidism, unspecified: Secondary | ICD-10-CM

## 2019-12-06 DIAGNOSIS — K2100 Gastro-esophageal reflux disease with esophagitis, without bleeding: Secondary | ICD-10-CM

## 2019-12-06 DIAGNOSIS — E785 Hyperlipidemia, unspecified: Secondary | ICD-10-CM | POA: Diagnosis not present

## 2019-12-06 DIAGNOSIS — F411 Generalized anxiety disorder: Secondary | ICD-10-CM

## 2019-12-06 NOTE — Chronic Care Management (AMB) (Addendum)
Chronic Care Management    Clinical Social Work Follow Up Note  12/06/2019 Name: Susan Contreras MRN: [id number] DOB: 1941-05-13  Susan Contreras is a 79 y.o. year old female who is a primary care patient of [name], FNP. The CCM team was consulted for assistance with Mental Health Counseling and Resources.   Review of patient status, including review of consultants reports, other relevant assessments, and collaboration with appropriate care team members and the patient's provider was performed as part of comprehensive patient evaluation and provision of chronic care management services.    SDOH (Social Determinants of Health) assessments performed: Yes; risk of depression; risk of stress    Chronic Care Management from 11/08/2019 in [facility]  PHQ-9 Total Score  8      GAD 7 : Generalized Anxiety Score 11/08/2019 11/02/2019  Nervous, Anxious, on Edge 1 1  Control/stop worrying 1 0  Worry too much - different things 1 0  Trouble relaxing 1 0  Restless 0 0  Easily annoyed or irritable 1 0  Afraid - awful might happen 0 0  Total GAD 7 Score 5 1  Anxiety Difficulty Somewhat difficult -     Outpatient Encounter Medications as of 12/06/2019  Medication Sig   ALPRAZolam (XANAX) 0.25 MG tablet TAKE 1 TABLET TWICE DAILY AS NEEDED FOR ANXIETY   atorvastatin (LIPITOR) 20 MG tablet Take 1 tablet (20 mg total) by mouth daily.   cetirizine (ZYRTEC) 10 MG tablet Take 1 tablet (10 mg total) by mouth daily.   Chlorphen-PE-Acetaminophen (NOREL AD PO) Take 1 tablet by mouth as needed.    fluticasone (FLONASE) 50 MCG/ACT nasal spray Place 1-2 sprays into both nostrils as needed for allergies or rhinitis.   irbesartan (AVAPRO) 75 MG tablet TAKE 1 TABLET DAILY   levothyroxine (SYNTHROID) 50 MCG tablet Take 1 tablet (50 mcg total) by mouth daily.   meclizine (ANTIVERT) 25 MG tablet TAKE ONE TABLET BY MOUTH THREE TIMES DAILY AS NEEDED FOR DIZZINESS (Patient taking differently:  Take 25 mg by mouth 3 (three) times daily as needed for dizziness. )   pantoprazole (PROTONIX) 20 MG tablet Take 20 mg by mouth 2 (two) times daily.   traZODone (DESYREL) 50 MG tablet Take 0.5-1 tablets (25-50 mg total) by mouth at bedtime as needed for sleep. (Patient not taking: Reported on 11/15/2019)   No facility-administered encounter medications on file as of 12/06/2019.     Goals Addressed             This Visit's Progress    Client will talk with LCSW in next 30 days to discuss anxiety /stress issues of concern for patinet at this time (pt-stated)       [place] (see longtitudinal plan of care for additional care plan information)  Current Barriers:  Mental health issues of concern (anxiety/stress) in client with Chronic Diagnoses of HTN, GAD, GERD, and HLD Decreased energy Transportation challenges.  Clinical Social Work Clinical Goal(s):  LCSW will talk with client in next 30 days to discuss client managenet of anxiety or stress issues.   Interventions: Talked with client about CCM program support services Talked with client about weight issue of client (she said she lost 23 pounds since Octoboer of 2020) Talked with Tynleigh about [name] PA psychiatric referral for client  Talked with Joshelyn about anxiety or stress issues she faces.    Talked with Lyndee about transport needs of client Talked with Anikka about her decreased appetite Talked  with client about sleeping challenges Talked with Bertrice about mucous issues (she said that she talked with [name] PA about this issue) Talked with client about decreased energy of client Talked with Bethel about getting ready for her phone call with Dr. Modesta Messing, psychiatrist, scheduled for May, 2021. Provided counseling support for client Talked with client about relaxation techniques of choice  Patient Self Care Activities:  Takes medications as prescribed Attends scheduled medical appointments  Patient SELF  CARE DEFICITS: Transportation challenges Sleeping challenges Mental health issues  Initial goal documentation        Follow Up Plan: LCSW to call client in next 4 weeks to talk with her about client management of anxiety and stress issues faced by client  Norva Riffle.Summit Arroyave MSW, LCSW Licensed Clinical Social Worker [facility] Care Management [phone]  I have reviewed and agree with the above documentation.   [name], FNP

## 2019-12-06 NOTE — Patient Instructions (Addendum)
Licensed Clinical Social Worker Visit Information  Goals we discussed today:  Goals Addressed            This Visit's Progress   . Client will talk with LCSW in next 30 days to discuss anxiety /stress issues of concern for patinet at this time (pt-stated)       [place] (see longtitudinal plan of care for additional care plan information)  Current Barriers:  Marland Kitchen Mental health issues of concern (anxiety/stress) in client with Chronic Diagnoses of HTN, GAD, GERD, and HLD . Decreased energy . Transportation challenges.  Clinical Social Work Clinical Goal(s):  Marland Kitchen LCSW will talk with client in next 30 days to discuss client managenet of anxiety or stress issues.   Interventions: . Talked with client about CCM program support services . Talked with client about weight issue of client (she said she lost 23 pounds since Octoboer of 2020) . Talked with Emanuel about [name] PA psychiatric referral for client  . Talked with Delight about anxiety or stress issues she faces.   .  Talked with Hiral about transport needs of client . Talked with Mccall about her decreased appetite . Talked with client about sleeping challenges . Talked with Sophya about mucous issues (she said that she talked with [name] PA about this issue) Talked with client about decreased energy of client  Talked with Faline about getting ready for her phone call with Dr. Modesta Messing, psychiatrist, scheduled for May, 2021.  Provided counseling support for client  Talked with client about relaxation techniques of choice  Patient Self Care Activities:  Takes medications as prescribed Attends scheduled medical appointments  Patient SELF CARE DEFICITS: Transportation challenges Sleeping challenges Mental health issues  Initial goal documentation       Materials Provided: No  Follow Up Plan:LCSW to call client in next 4 weeks to talk with her about client management of anxiety and stress issues faced by  client  The patient verbalized understanding of instructions provided today and declined a print copy of patient instruction materials.   Norva Riffle.Greyson Riccardi MSW, LCSW Licensed Clinical Social Worker [facility] Care Management [phone]

## 2019-12-13 ENCOUNTER — Ambulatory Visit: Payer: Medicare HMO | Admitting: *Deleted

## 2019-12-13 DIAGNOSIS — I10 Essential (primary) hypertension: Secondary | ICD-10-CM

## 2019-12-13 DIAGNOSIS — F411 Generalized anxiety disorder: Secondary | ICD-10-CM

## 2019-12-13 DIAGNOSIS — K219 Gastro-esophageal reflux disease without esophagitis: Secondary | ICD-10-CM

## 2019-12-13 NOTE — Chronic Care Management (AMB) (Addendum)
Chronic Care Management   Follow Up Note   12/13/2019 Name: Susan Contreras MRN: [id number] DOB: 1940-09-12  Referred by: [name], FNP Reason for referral : Chronic Care Management (Incoming Patient Call)   Susan Contreras is a 79 y.o. year old female who is a primary care patient of [name], FNP. The CCM team was consulted for assistance with chronic disease management and care coordination needs.    Review of patient status, including review of consultants reports, relevant laboratory and other test results, and collaboration with appropriate care team members and the patient's provider was performed as part of comprehensive patient evaluation and provision of chronic care management services.    I spoke with Susan Contreras by telephone today after she left a message requesting that I return her call. She primarily wanted to discuss her anxiety and that she'd like to wait on scheduling the GI appointment for now because she has a lot of other things she is dealing with. She understands the importance of following up with GI.   SDOH (Social Determinants of Health) assessments performed: No See Care Plan activities for detailed interventions related to [facility])      Outpatient Encounter Medications as of 12/13/2019  Medication Sig   ALPRAZolam (XANAX) 0.25 MG tablet TAKE 1 TABLET TWICE DAILY AS NEEDED FOR ANXIETY   atorvastatin (LIPITOR) 20 MG tablet Take 1 tablet (20 mg total) by mouth daily.   cetirizine (ZYRTEC) 10 MG tablet Take 1 tablet (10 mg total) by mouth daily.   Chlorphen-PE-Acetaminophen (NOREL AD PO) Take 1 tablet by mouth as needed.    fluticasone (FLONASE) 50 MCG/ACT nasal spray Place 1-2 sprays into both nostrils as needed for allergies or rhinitis.   irbesartan (AVAPRO) 75 MG tablet TAKE 1 TABLET DAILY   levothyroxine (SYNTHROID) 50 MCG tablet Take 1 tablet (50 mcg total) by mouth daily.   meclizine (ANTIVERT) 25 MG tablet TAKE ONE TABLET BY MOUTH THREE TIMES  DAILY AS NEEDED FOR DIZZINESS (Patient taking differently: Take 25 mg by mouth 3 (three) times daily as needed for dizziness. )   pantoprazole (PROTONIX) 20 MG tablet Take 20 mg by mouth 2 (two) times daily.   traZODone (DESYREL) 50 MG tablet Take 0.5-1 tablets (25-50 mg total) by mouth at bedtime as needed for sleep. (Patient not taking: Reported on 11/15/2019)   No facility-administered encounter medications on file as of 12/13/2019.      RN Care Plan     Anxiety Management (pt-stated)       CARE PLAN ENTRY (see longitudinal plan of care for additional care plan information)  Anxiety in a patient with HTN, GAD, GERD, HLD Hypothyroidism  Current Barriers:  Chronic Disease Management support and education needs related to anxiety  Nurse Case Manager Clinical Goal(s):  Over the next 30 days, patient will talk with psychiatrist regarding GAD management Over the next 30 days, patient will talk with LCSW regarding stress and anxiety management strategies  Interventions:  Inter-disciplinary care team collaboration (see longitudinal plan of care) Chart reviewed including office notes and lab results Discussed GAD symptoms with patient Discussed stress in life Discussed family support Discussed hobbies and physical limitations that keep her from doing these hobbies Discussed upcoming psychiatry appointment and the reason for the referral Encouraged physical activity as a stress reliever Encouraged patient to keep psychiatry appointment Discussed how she will communicate with the psychiatrist (via video, telephone, etc) Encouraged patient to talk with LCSW regarding psychosocial issues Patient provided with CCM  contact information and encouraged to reach out as needed  Patient Self Care Activities:  Performs ADL's independently Performs IADL's independently  Initial goal documentation     Other    GERD       CARE PLAN ENTRY (see longitudinal plan of care for additional care plan  information)  Current Barriers:  Chronic Disease Management support and education needs related to GERD  Nurse Case Manager Clinical Goal(s):  Over the next 30 days, patient will see GI specialist  Interventions:  Inter-disciplinary care team collaboration (see longitudinal plan of care) Previous chart review including recent office notes and lab results Neg H.Pylori Referral to Dr Laural Golden was denied b/c she is established with [facility]. Patient requested to switch to Dr Laural Golden Discussed need for GI appt with patient She prefers to wait until after she talks with psychiatrist and sees dermatologist. Feels that she has too much going on right now.  Discussed GERD symptoms Discussed GERD management Protonix Small meals Inclining bed Strongly encouraged patient to see GI sooner rather than later Discussed plans with patient for ongoing care management follow up and provided patient with direct contact information for care management team Encouraged patient to reach out to PCP with any new or worsening symptoms  Patient Self Care Activities:  Performs ADL's independently Performs IADL's independently   Please see past updates related to this goal by clicking on the "Past Updates" button in the selected goal           Plan:   The care management team will reach out to the patient again over the next 30 days.    SIGNATURE  I have reviewed and agree with the above documentation.   [name], FNP

## 2019-12-13 NOTE — Patient Instructions (Signed)
Visit Information  Goals Addressed            This Visit's Progress     Patient Stated   . Anxiety Management (pt-stated)       CARE PLAN ENTRY (see longitudinal plan of care for additional care plan information)  Anxiety in a patient with HTN, GAD, GERD, HLD Hypothyroidism  Current Barriers:  . Chronic Disease Management support and education needs related to anxiety  Nurse Case Manager Clinical Goal(s):  Marland Kitchen Over the next 30 days, patient will talk with psychiatrist regarding GAD management . Over the next 30 days, patient will talk with LCSW regarding stress and anxiety management strategies  Interventions:  . Inter-disciplinary care team collaboration (see longitudinal plan of care) . Chart reviewed including office notes and lab results . Discussed GAD symptoms with patient . Discussed stress in life . Discussed family support . Discussed hobbies and physical limitations that keep her from doing these hobbies . Discussed upcoming psychiatry appointment and the reason for the referral . Encouraged physical activity as a stress reliever . Encouraged patient to keep psychiatry appointment . Discussed how she will communicate with the psychiatrist (via video, telephone, etc) . Encouraged patient to talk with LCSW regarding psychosocial issues . Patient provided with CCM contact information and encouraged to reach out as needed  Patient Self Care Activities:  . Performs ADL's independently . Performs IADL's independently  Initial goal documentation       Other   . GERD       CARE PLAN ENTRY (see longitudinal plan of care for additional care plan information)  Current Barriers:  . Chronic Disease Management support and education needs related to GERD  Nurse Case Manager Clinical Goal(s):  Marland Kitchen Over the next 30 days, patient will see GI specialist  Interventions:  . Inter-disciplinary care team collaboration (see longitudinal plan of care) . Previous chart review  including recent office notes and lab results o Neg H.Pylori o Referral to Dr Laural Golden was denied b/c she is established with [facility]. Patient requested to switch to Dr Laural Golden . Discussed need for GI appt with patient o She prefers to wait until after she talks with psychiatrist and sees dermatologist. Feels that she has too much going on right now.  . Discussed GERD symptoms . Discussed GERD management o Protonix o Small meals o Inclining bed . Strongly encouraged patient to see GI sooner rather than later . Discussed plans with patient for ongoing care management follow up and provided patient with direct contact information for care management team . Encouraged patient to reach out to PCP with any new or worsening symptoms  Patient Self Care Activities:  . Performs ADL's independently . Performs IADL's independently   Please see past updates related to this goal by clicking on the "Past Updates" button in the selected goal         The patient verbalized understanding of instructions provided today and declined a print copy of patient instruction materials.   Follow-up Plan The care management team will reach out to the patient again over the next 30 days.   Chong Sicilian, BSN, RN-BC Embedded Chronic Care Manager [facility] Direct Dial: [phone]

## 2019-12-14 ENCOUNTER — Ambulatory Visit: Payer: Medicare HMO | Admitting: *Deleted

## 2019-12-14 DIAGNOSIS — I10 Essential (primary) hypertension: Secondary | ICD-10-CM

## 2019-12-14 DIAGNOSIS — E039 Hypothyroidism, unspecified: Secondary | ICD-10-CM

## 2019-12-14 DIAGNOSIS — E785 Hyperlipidemia, unspecified: Secondary | ICD-10-CM | POA: Diagnosis not present

## 2019-12-14 DIAGNOSIS — F411 Generalized anxiety disorder: Secondary | ICD-10-CM

## 2019-12-14 NOTE — Chronic Care Management (AMB) (Addendum)
Chronic Care Management   Follow Up Note   12/14/2019 Name: Markita Urrabazo MRN: [id number] DOB: 1941/08/21  Referred by: [name], FNP Reason for referral : Chronic Care Management (Incoming patient call)   Susan Contreras is a 79 y.o. year old female who is a primary care patient of [name], FNP. The CCM team was consulted for assistance with chronic disease management and care coordination needs.    Review of patient status, including review of consultants reports, relevant laboratory and other test results, and collaboration with appropriate care team members and the patient's provider was performed as part of comprehensive patient evaluation and provision of chronic care management services.    SDOH (Social Determinants of Health) assessments performed: No See Care Plan activities for detailed interventions related to [facility])      Outpatient Encounter Medications as of 12/14/2019  Medication Sig   ALPRAZolam (XANAX) 0.25 MG tablet TAKE 1 TABLET TWICE DAILY AS NEEDED FOR ANXIETY   atorvastatin (LIPITOR) 20 MG tablet Take 1 tablet (20 mg total) by mouth daily.   cetirizine (ZYRTEC) 10 MG tablet Take 1 tablet (10 mg total) by mouth daily.   Chlorphen-PE-Acetaminophen (NOREL AD PO) Take 1 tablet by mouth as needed.    fluticasone (FLONASE) 50 MCG/ACT nasal spray Place 1-2 sprays into both nostrils as needed for allergies or rhinitis.   irbesartan (AVAPRO) 75 MG tablet TAKE 1 TABLET DAILY   levothyroxine (SYNTHROID) 50 MCG tablet Take 1 tablet (50 mcg total) by mouth daily.   meclizine (ANTIVERT) 25 MG tablet TAKE ONE TABLET BY MOUTH THREE TIMES DAILY AS NEEDED FOR DIZZINESS (Patient taking differently: Take 25 mg by mouth 3 (three) times daily as needed for dizziness. )   pantoprazole (PROTONIX) 20 MG tablet Take 20 mg by mouth 2 (two) times daily.   traZODone (DESYREL) 50 MG tablet Take 0.5-1 tablets (25-50 mg total) by mouth at bedtime as needed for sleep. (Patient not  taking: Reported on 11/15/2019)   No facility-administered encounter medications on file as of 12/14/2019.     RN Care Plan      Anxiety Management (pt-stated)       CARE PLAN ENTRY (see longitudinal plan of care for additional care plan information)  Anxiety in a patient with HTN, GAD, GERD, HLD Hypothyroidism  Current Barriers:  Chronic Disease Management support and education needs related to anxiety  Nurse Case Manager Clinical Goal(s):  Over the next 30 days, patient will talk with psychiatrist regarding GAD management Over the next 30 days, patient will talk with LCSW regarding stress and anxiety management strategies  Interventions:  Inter-disciplinary care team collaboration (see longitudinal plan of care) Chart reviewed including office notes and lab results Previously discussed: GAD symptoms with patient stress in life family support hobbies and physical limitations that keep her from doing these hobbies Discussed upcoming psychiatry appointment and the reason for the referral Discussed how she will communicate with the psychiatrist (via video, telephone, etc).  Reassured patient about these options.  Talked about My Chart video visits as an option. Reassured patient that no one else can use her My Chart account unless she gives them her username and password Previously encouraged patient to talk with LCSW regarding psychosocial issues Patient previously provided with CCM contact information and encouraged to reach out as needed  Patient Self Care Activities:  Performs ADL's independently Performs IADL's independently  Please see past updates related to this goal by clicking on the "Past Updates" button in the  selected goal           Plan:   The care management team will reach out to the patient again over the next 45 days.   Chong Sicilian, BSN, RN-BC Embedded Chronic Care Manager [facility] Direct Dial:  [phone]     I have reviewed and agree with the above documentation.   [name], FNP

## 2019-12-14 NOTE — Patient Instructions (Signed)
Visit Information  Goals Addressed            This Visit's Progress     Patient Stated   . Anxiety Management (pt-stated)       CARE PLAN ENTRY (see longitudinal plan of care for additional care plan information)  Anxiety in a patient with HTN, GAD, GERD, HLD Hypothyroidism  Current Barriers:  . Chronic Disease Management support and education needs related to anxiety  Nurse Case Manager Clinical Goal(s):  Marland Kitchen Over the next 30 days, patient will talk with psychiatrist regarding GAD management . Over the next 30 days, patient will talk with LCSW regarding stress and anxiety management strategies  Interventions:  . Inter-disciplinary care team collaboration (see longitudinal plan of care) . Chart reviewed including office notes and lab results . Previously discussed: o GAD symptoms with patient o stress in life o family support o hobbies and physical limitations that keep her from doing these hobbies . Discussed upcoming psychiatry appointment and the reason for the referral . Discussed how she will communicate with the psychiatrist (via video, telephone, etc).  o Reassured patient about these options.  o Talked about My Chart video visits as an option. o Reassured patient that no one else can use her My Chart account unless she gives them her username and password . Previously encouraged patient to talk with LCSW regarding psychosocial issues . Patient previously provided with CCM contact information and encouraged to reach out as needed  Patient Self Care Activities:  . Performs ADL's independently . Performs IADL's independently  Please see past updates related to this goal by clicking on the "Past Updates" button in the selected goal         The patient verbalized understanding of instructions provided today and declined a print copy of patient instruction materials.   Follow-up Plan The care management team will reach out to the patient again over the next 45  days.   Chong Sicilian, BSN, RN-BC Embedded Chronic Care Manager [facility] [place] [facility] Direct Dial: [phone]

## 2019-12-26 NOTE — Progress Notes (Signed)
Virtual Visit via Video Note  I connected with Susan Contreras on 12/29/19 at 10:00 AM EDT by a video enabled telemedicine application and verified that I am speaking with the correct person using two identifiers.   I discussed the limitations of evaluation and management by telemedicine and the availability of in person appointments. The patient expressed understanding and agreed to proceed.    I discussed the assessment and treatment plan with the patient. The patient was provided an opportunity to ask questions and all were answered. The patient agreed with the plan and demonstrated an understanding of the instructions.   The patient was advised to call back or seek an in-person evaluation if the symptoms worsen or if the condition fails to improve as anticipated.  I provided 40 minutes of non-face-to-face time during this encounter.   Norman Clay, MD     Psychiatric Initial Adult Assessment   Patient Identification: Susan Contreras MRN:  [id number] Date of Evaluation:  12/29/2019 Referral Source: [name], FNP Chief Complaint:   Chief Complaint    Depression; Psychiatric Evaluation    "I did not have any idea that I am depressed." Visit Diagnosis:    ICD-10-CM   1. Anxiety disorder, unspecified type  F41.9     History of Present Illness:   Susan Contreras is a 79 y.o. year old female with a history of anxiety, GERD, who is referred for anxiety.   She states that her PCP recommended for this appointment.  She states that she was told by her provider that she was depressed, although she did not have any idea. She believes that it was due to her thyroid, referring that she feels much better after adjustment of levothyroxine. It was reduced from 75 mcg to 50 mcg on 4/11.  Although she used to have crying spells, felt weak, decreased appetite, and hair loss, she does not experience any lately.  She tries to stay busy. She is "outside door person" and enjoys sitting out except the  time she struggles with allergy. She reports good relationship with her husband, her children who live nearby. Although she briefly mentions about her older son's divorce after more than 20 years, she states that things are fine now. She states that she "never had problems with nerves." When she is asked about Xanax she is taking at least for a few years, she states that she takes it to feel relaxed and sleeps well at night. She does not think her provider discussed with her of recent uptitration of Xanax (from daily to BID) ; she takes it once a day. She states that it will be "okay" discontinuing Xanax, although she is not sure. She is afraid of taking psychotropics due to somnolence she experienced from lexapro. She believes that things are going well, and she agrees to reach out to the office if any concern of her mood symptoms.   She denies mood symptoms except occasional anxiety as described below. She denies alcohol or drug use.   Regular day- Grocery shopping, yard work, sitting outside, house chores, doing crossword puzzle  Medication- Xanax 0.25 mg BID 4/12   Wt Readings from Last 3 Encounters:  11/15/19 150 lb (68 kg)  11/02/19 151 lb 3.2 oz (68.6 kg)  10/29/19 154 lb 5.2 oz (70 kg)    Associated Signs/Symptoms: Depression Symptoms:  anxiety, (Hypo) Manic Symptoms:  denies decreased need for sleep, euphoria Anxiety Symptoms:  mild anxiety Psychotic Symptoms:  denies AH, VH PTSD Symptoms: NA  Past  Psychiatric History:  Outpatient: reports episode of depression in the context of cholecystectomy, saw psychiatrist 30 years ago for insomnia Psychiatry admission: denies  Previous suicide attempt: denies  Past trials of medication: lexapro, amitriptyline, trazodone History of violence:   Previous Psychotropic Medications: Yes   Substance Abuse History in the last 12 months:  No.  Consequences of Substance Abuse: NA  Past Medical History:  Past Medical History:  Diagnosis  Date  . Anxiety   . Depression   . Diverticulosis   . Esophageal stricture   . Family hx of colorectal cancer   . GERD (gastroesophageal reflux disease)   . Helicobacter pylori (H. pylori) 1997   EGD  . Hemorrhoids   . Hiatal hernia   . Hx of adenomatous colonic polyps   . Hyperlipemia   . Hypertension   . PUD (peptic ulcer disease) 1997   EGD  . Thyroid disease   . Vertigo     Past Surgical History:  Procedure Laterality Date  . ABDOMINAL HYSTERECTOMY    . BILATERAL OOPHORECTOMY    . CHOLECYSTECTOMY      Family Psychiatric History:  Denies, As below  Family History:  Family History  Problem Relation Age of Onset  . Colon cancer Sister   . Stroke Mother   . Heart disease Mother   . Parkinson's disease Father   . Ovarian cancer Sister   . Esophageal cancer Neg Hx   . Stomach cancer Neg Hx   . Kidney disease Neg Hx   . Liver disease Neg Hx   . Diabetes Neg Hx     Social History:   Social History   Socioeconomic History  . Marital status: Married    Spouse name: Not on file  . Number of children: 2  . Years of education: Not on file  . Highest education level: Not on file  Occupational History  . Occupation: Retired  Tobacco Use  . Smoking status: Never Smoker  . Smokeless tobacco: Never Used  Substance and Sexual Activity  . Alcohol use: No  . Drug use: No  . Sexual activity: Not on file  Other Topics Concern  . Not on file  Social History Narrative   2 caffeine drinks daily    Social Determinants of Health   Financial Resource Strain:   . Difficulty of Paying Living Expenses:   Food Insecurity:   . Worried About Charity fundraiser in the Last Year:   . Arboriculturist in the Last Year:   Transportation Needs: No Transportation Needs  . Lack of Transportation (Medical): No  . Lack of Transportation (Non-Medical): No  Physical Activity:   . Days of Exercise per Week:   . Minutes of Exercise per Session:   Stress: Stress Concern Present  .  Feeling of Stress : Rather much  Social Connections:   . Frequency of Communication with Friends and Family:   . Frequency of Social Gatherings with Friends and Family:   . Attends Religious Services:   . Active Member of Clubs or Organizations:   . Attends Archivist Meetings:   Marland Kitchen Marital Status:     Additional Social History:  Retired in 1998. Used to work at [employer], for more than 20 years Married for 8 years, two grown son (both of them are divorced), who live nearby She grew up in [place], [place], family of 90. Good relationship with her parents  Allergies:   Allergies  Allergen Reactions  .  Biaxin [Clarithromycin]   . Cephalexin   . Ciprofloxacin   . Doxycycline     Headaches  . Flagyl [Metronidazole Hcl]   . Ketek [Telithromycin]   . Sulfa Antibiotics     Metabolic Disorder Labs: No results found for: HGBA1C, MPG No results found for: PROLACTIN Lab Results  Component Value Date   CHOL 265 (H) 06/17/2019   TRIG 280 (H) 06/17/2019   HDL 49 06/17/2019   CHOLHDL 5.4 (H) 06/17/2019   LDLCALC 164 (H) 06/17/2019   LDLCALC Comment 12/28/2018   Lab Results  Component Value Date   TSH 0.370 (L) 11/29/2019    Therapeutic Level Labs: No results found for: LITHIUM No results found for: CBMZ No results found for: VALPROATE  Current Medications: Current Outpatient Medications  Medication Sig Dispense Refill  . ALPRAZolam (XANAX) 0.25 MG tablet TAKE 1 TABLET TWICE DAILY AS NEEDED FOR ANXIETY 60 tablet 0  . atorvastatin (LIPITOR) 20 MG tablet Take 1 tablet (20 mg total) by mouth daily. 90 tablet 3  . cetirizine (ZYRTEC) 10 MG tablet Take 1 tablet (10 mg total) by mouth daily. 30 tablet 11  . Chlorphen-PE-Acetaminophen (NOREL AD PO) Take 1 tablet by mouth as needed.     . fluticasone (FLONASE) 50 MCG/ACT nasal spray Place 1-2 sprays into both nostrils as needed for allergies or rhinitis.    Marland Kitchen irbesartan (AVAPRO) 75 MG tablet TAKE 1 TABLET  DAILY 90 tablet 0  . levothyroxine (SYNTHROID) 50 MCG tablet Take 1 tablet (50 mcg total) by mouth daily. 90 tablet 3  . meclizine (ANTIVERT) 25 MG tablet TAKE ONE TABLET BY MOUTH THREE TIMES DAILY AS NEEDED FOR DIZZINESS (Patient taking differently: Take 25 mg by mouth 3 (three) times daily as needed for dizziness. ) 30 tablet 0  . pantoprazole (PROTONIX) 20 MG tablet Take 20 mg by mouth 2 (two) times daily.    . traZODone (DESYREL) 50 MG tablet Take 0.5-1 tablets (25-50 mg total) by mouth at bedtime as needed for sleep. (Patient not taking: Reported on 11/15/2019) 30 tablet 3   No current facility-administered medications for this visit.    Musculoskeletal: Strength & Muscle Tone: N/A Gait & Station: N/A Patient leans: N/A  Psychiatric Specialty Exam: Review of Systems  Psychiatric/Behavioral: Negative for agitation, behavioral problems, confusion, decreased concentration, dysphoric mood, hallucinations, self-injury, sleep disturbance and suicidal ideas. The patient is nervous/anxious. The patient is not hyperactive.   All other systems reviewed and are negative.   There were no vitals taken for this visit.There is no height or weight on file to calculate BMI.  General Appearance: Fairly Groomed  Eye Contact:  Good  Speech:  Clear and Coherent  Volume:  Normal  Mood:  "okay"  Affect:  Appropriate, Congruent and euthymic  Thought Process:  Coherent  Orientation:  Full (Time, Place, and Person)  Thought Content:  Logical  Suicidal Thoughts:  No  Homicidal Thoughts:  No  Memory:  Immediate;   Good  Judgement:  Good  Insight:  Fair  Psychomotor Activity:  Normal  Concentration:  Concentration: Good and Attention Span: Good  Recall:  Good  Fund of Knowledge:Good  Language: Good  Akathisia:  No  Handed:  Right  AIMS (if indicated):  not done  Assets:  Communication Skills Desire for Improvement  ADL's:  Intact  Cognition: WNL  Sleep:  Fair   Screenings: GAD-7     Chronic  Care Management from 11/08/2019 in [facility] Office Visit from 11/02/2019 in  [facility]  Total GAD-7 Score  5  1    PHQ2-9     Chronic Care Management from 11/08/2019 in [facility] Visit from 11/02/2019 in [facility] Visit from 12/28/2018 in [facility] Office Visit from 08/16/2018 in [facility] Visit from 05/27/2018 in [facility]  PHQ-2 Total Score  2  1  0  0  0  PHQ-9 Total Score  8  5  --  --  --      Assessment and Plan:  Susan Contreras is a 79 y.o. year old female with a history of anxiety, GERD, who is referred for anxiety.   # Unspecified anxiety disorder Although she did have an episode of worsening anxiety, appetite loss with crying spells, these symptoms have improved significantly since reducing the dose of levothyroxine.  Clinical course is consistent with anxiety secondary to iatrogenic hyperthyroidism.  Although she does have underlying anxiety symptoms at times, it has been relatively manageable, and she has no interest in starting psychotropics due to the adverse reaction she had from Lexapro.  She is amenable to try lower dose of Xanax if able to avoid risk of fall.  She agrees to reach out to the office as needed.    Plan 1. Discontinue Xanax if able 2. Return to clinic as needed  The patient demonstrates the following risk factors for suicide: Chronic risk factors for suicide include: psychiatric disorder of anxiety. Acute risk factors for suicide include: unemployment. Protective factors for this patient include: positive social support, responsibility to others (children, family) and hope for the future. Considering these factors, the overall suicide risk at this point appears to be low. Patient is appropriate for outpatient follow up.   Norman Clay, MD 5/6/202110:49 AM

## 2019-12-29 ENCOUNTER — Other Ambulatory Visit: Payer: Self-pay

## 2019-12-29 ENCOUNTER — Telehealth (INDEPENDENT_AMBULATORY_CARE_PROVIDER_SITE_OTHER): Payer: Medicare HMO | Admitting: Psychiatry

## 2019-12-29 ENCOUNTER — Encounter (HOSPITAL_COMMUNITY): Payer: Self-pay | Admitting: Psychiatry

## 2019-12-29 DIAGNOSIS — F419 Anxiety disorder, unspecified: Secondary | ICD-10-CM

## 2019-12-29 NOTE — Patient Instructions (Addendum)
1. Discontinue Xanax if able 2. Return as needed. Please reach out to our clinic if any concerns of mood symptoms

## 2019-12-30 ENCOUNTER — Ambulatory Visit: Payer: Medicare HMO | Admitting: Physician Assistant

## 2020-01-03 ENCOUNTER — Ambulatory Visit: Payer: Medicare HMO | Admitting: *Deleted

## 2020-01-03 DIAGNOSIS — E039 Hypothyroidism, unspecified: Secondary | ICD-10-CM

## 2020-01-03 DIAGNOSIS — F411 Generalized anxiety disorder: Secondary | ICD-10-CM

## 2020-01-03 NOTE — Patient Instructions (Signed)
Visit Information  Goals Addressed            This Visit's Progress     Patient Stated   . "I need help managing my anxiety" (pt-stated)       CARE PLAN ENTRY (see longitudinal plan of care for additional care plan information)  Anxiety in a patient with HTN, GAD, GERD, HLD, Hypothyroidism  Current Barriers:  . Chronic Disease Management support and education needs related to anxiety  Nurse Case Manager Clinical Goal(s):  Marland Kitchen Over the next 30 days, patient will talk with psychiatrist regarding GAD management . Over the next 30 days, patient will talk with LCSW regarding stress and anxiety management strategies  Interventions:  . Inter-disciplinary care team collaboration (see longitudinal plan of care) . Chart reviewed including office notes and lab results . Previously discussed: o GAD symptoms with patient o stress in life o family support o hobbies and physical limitations that keep her from doing these hobbies . Discussed psychiatry appointment . Encouraged patient to talk with LCSW regarding psychosocial issues . Patient previously provided with CCM contact information and encouraged to reach out as needed  Patient Self Care Activities:  . Performs ADL's independently . Performs IADL's independently  Please see past updates related to this goal by clicking on the "Past Updates" button in the selected goal      . "I need help managing my thyroid" (pt-stated)       [place] (see longitudinal plan of care for additional care plan information)  Current Barriers:  . Chronic Disease Management support and education needs related to hypothyroidism  Nurse Case Manager Clinical Goal(s):  Marland Kitchen Over the next week, patient will return to clinic to have TSH level drawn . Over the next 60 days, patient will work with PCP to adjust thyroid medication as needed  Interventions:  . Inter-disciplinary care team collaboration (see longitudinal plan of care) . Evaluation of  current treatment plan related to hypothyroidism and patient's adherence to plan as established by provider. . Previously reviewed medications with patient and discussed change from levothyroxine 73mcg to 70mcg . Discussed anxiety and decreased energy that has affected her today . Discussed recent appt with mental health provider . Listened to patient and provided reassurance . Discussed recent ENT referral . Reviewed order for TSH that is to be drawn on 01/12/20 . Previously provided patient with RN CM contact number and encouraged to reach out as needed . Encouraged patient to reach out to PCP with any new or worsening symptoms  Patient Self Care Activities:  . Performs ADL's independently . Performs IADL's independently  Please see past updates related to this goal by clicking on the "Past Updates" button in the selected goal         The patient verbalized understanding of instructions provided today and declined a print copy of patient instruction materials.   Follow-up Plan The care management team will reach out to the patient again over the next 10 days.   Chong Sicilian, BSN, RN-BC Embedded Chronic Care Manager [facility] [place] Management Direct Dial: [phone]

## 2020-01-03 NOTE — Chronic Care Management (AMB) (Addendum)
Chronic Care Management   Follow Up Note   01/03/2020 Name: Susan Contreras MRN: [id number] DOB: 06-07-1941  Referred by: [name], FNP Reason for referral : Chronic Care Management (Incoming patient call )   Susan Contreras is a 79 y.o. year old female who is a primary care patient of [name], FNP. The CCM team was consulted for assistance with chronic disease management and care coordination needs.    Review of patient status, including review of consultants reports, relevant laboratory and other test results, and collaboration with appropriate care team members and the patient's provider was performed as part of comprehensive patient evaluation and provision of chronic care management services.    SDOH (Social Determinants of Health) assessments performed: No See Care Plan activities for detailed interventions related to [facility])      Outpatient Encounter Medications as of 01/03/2020  Medication Sig   ALPRAZolam (XANAX) 0.25 MG tablet TAKE 1 TABLET TWICE DAILY AS NEEDED FOR ANXIETY   atorvastatin (LIPITOR) 20 MG tablet Take 1 tablet (20 mg total) by mouth daily.   cetirizine (ZYRTEC) 10 MG tablet Take 1 tablet (10 mg total) by mouth daily.   Chlorphen-PE-Acetaminophen (NOREL AD PO) Take 1 tablet by mouth as needed.    fluticasone (FLONASE) 50 MCG/ACT nasal spray Place 1-2 sprays into both nostrils as needed for allergies or rhinitis.   irbesartan (AVAPRO) 75 MG tablet TAKE 1 TABLET DAILY   levothyroxine (SYNTHROID) 50 MCG tablet Take 1 tablet (50 mcg total) by mouth daily.   meclizine (ANTIVERT) 25 MG tablet TAKE ONE TABLET BY MOUTH THREE TIMES DAILY AS NEEDED FOR DIZZINESS (Patient taking differently: Take 25 mg by mouth 3 (three) times daily as needed for dizziness. )   pantoprazole (PROTONIX) 20 MG tablet Take 20 mg by mouth 2 (two) times daily.   traZODone (DESYREL) 50 MG tablet Take 0.5-1 tablets (25-50 mg total) by mouth at bedtime as needed for sleep. (Patient not  taking: Reported on 11/15/2019)   No facility-administered encounter medications on file as of 01/03/2020.    RN Care Plan     "I need help managing my anxiety"       CARE PLAN ENTRY (see longitudinal plan of care for additional care plan information)  Anxiety in a patient with HTN, GAD, GERD, HLD, Hypothyroidism  Current Barriers:  Chronic Disease Management support and education needs related to anxiety  Nurse Case Manager Clinical Goal(s):  Over the next 30 days, patient will talk with psychiatrist regarding GAD management Over the next 30 days, patient will talk with LCSW regarding stress and anxiety management strategies  Interventions:  Inter-disciplinary care team collaboration (see longitudinal plan of care) Chart reviewed including office notes and lab results Previously discussed: GAD symptoms with patient stress in life family support hobbies and physical limitations that keep her from doing these hobbies Discussed psychiatry appointment Encouraged patient to talk with LCSW regarding psychosocial issues Patient previously provided with CCM contact information and encouraged to reach out as needed  Patient Self Care Activities:  Performs ADL's independently Performs IADL's independently  Please see past updates related to this goal by clicking on the "Past Updates" button in the selected goal       "I need help managing my thyroid"       CARE PLAN ENTRY (see longitudinal plan of care for additional care plan information)  Current Barriers:  Chronic Disease Management support and education needs related to hypothyroidism  Nurse Case Manager Clinical Goal(s):  Over the next week, patient will return to clinic to have TSH level drawn Over the next 60 days, patient will work with PCP to adjust thyroid medication as needed  Interventions:  Inter-disciplinary care team collaboration (see longitudinal plan of care) Evaluation of current treatment plan related to  hypothyroidism and patient's adherence to plan as established by provider. Previously reviewed medications with patient and discussed change from levothyroxine 69mcg to 46mcg Discussed anxiety and decreased energy that has affected her today Discussed recent appt with mental health provider Listened to patient and provided reassurance Discussed recent ENT referral Reviewed order for TSH that is to be drawn on 01/12/20 Previously provided patient with RN CM contact number and encouraged to reach out as needed Encouraged patient to reach out to PCP with any new or worsening symptoms  Patient Self Care Activities:  Performs ADL's independently Performs IADL's independently  Please see past updates related to this goal by clicking on the "Past Updates" button in the selected goal          Plan:   The care management team will reach out to the patient again over the next 10 days.    Chong Sicilian, BSN, RN-BC Embedded Chronic Care Manager [facility] [place] Management Direct Dial: [phone]    I have reviewed and agree with the above  documentation.   [name], FNP

## 2020-01-04 ENCOUNTER — Telehealth: Payer: Medicare HMO

## 2020-01-04 ENCOUNTER — Ambulatory Visit (INDEPENDENT_AMBULATORY_CARE_PROVIDER_SITE_OTHER): Payer: Medicare HMO | Admitting: *Deleted

## 2020-01-04 DIAGNOSIS — F411 Generalized anxiety disorder: Secondary | ICD-10-CM

## 2020-01-04 DIAGNOSIS — E039 Hypothyroidism, unspecified: Secondary | ICD-10-CM

## 2020-01-05 ENCOUNTER — Ambulatory Visit: Payer: Self-pay | Admitting: Licensed Clinical Social Worker

## 2020-01-05 DIAGNOSIS — I10 Essential (primary) hypertension: Secondary | ICD-10-CM | POA: Diagnosis not present

## 2020-01-05 DIAGNOSIS — E039 Hypothyroidism, unspecified: Secondary | ICD-10-CM | POA: Diagnosis not present

## 2020-01-05 DIAGNOSIS — E785 Hyperlipidemia, unspecified: Secondary | ICD-10-CM | POA: Diagnosis not present

## 2020-01-05 DIAGNOSIS — F411 Generalized anxiety disorder: Secondary | ICD-10-CM

## 2020-01-05 NOTE — Chronic Care Management (AMB) (Addendum)
Chronic Care Management    Clinical Social Work Follow Up Note  01/05/2020 Name: Susan Contreras MRN: [id number] DOB: Feb 24, 1941  Susan Contreras is a 79 y.o. year old female who is a primary care patient of Susan Balloon, FNP. The CCM team was consulted for assistance with Susan Contreras .   Review of patient status, including review of consultants reports, other relevant assessments, and collaboration with appropriate care team members and the patient's provider was performed as part of comprehensive patient evaluation and provision of chronic care management services.    SDOH (Social Determinants of Health) assessments performed: Yes;risk for depression; risk for stress    Chronic Care Management from 11/08/2019 in [facility]  PHQ-9 Total Score  8       GAD 7 : Generalized Anxiety Score 11/08/2019 11/02/2019  Nervous, Anxious, on Edge 1 1  Control/stop worrying 1 0  Worry too much - different things 1 0  Trouble relaxing 1 0  Restless 0 0  Easily annoyed or irritable 1 0  Afraid - awful might happen 0 0  Total GAD 7 Score 5 1  Anxiety Difficulty Somewhat difficult -     Outpatient Encounter Medications as of 01/05/2020  Medication Sig   ALPRAZolam (XANAX) 0.25 MG tablet TAKE 1 TABLET TWICE DAILY AS NEEDED FOR ANXIETY   atorvastatin (LIPITOR) 20 MG tablet Take 1 tablet (20 mg total) by mouth daily.   cetirizine (ZYRTEC) 10 MG tablet Take 1 tablet (10 mg total) by mouth daily.   Chlorphen-PE-Acetaminophen (NOREL AD PO) Take 1 tablet by mouth as needed.    fluticasone (FLONASE) 50 MCG/ACT nasal spray Place 1-2 sprays into both nostrils as needed for allergies or rhinitis.   irbesartan (AVAPRO) 75 MG tablet TAKE 1 TABLET DAILY   levothyroxine (SYNTHROID) 50 MCG tablet Take 1 tablet (50 mcg total) by mouth daily.   meclizine (ANTIVERT) 25 MG tablet TAKE ONE TABLET BY MOUTH THREE TIMES DAILY AS NEEDED FOR DIZZINESS (Patient taking differently: Take 25 mg by  mouth 3 (three) times daily as needed for dizziness. )   pantoprazole (PROTONIX) 20 MG tablet Take 20 mg by mouth 2 (two) times daily.   traZODone (DESYREL) 50 MG tablet Take 0.5-1 tablets (25-50 mg total) by mouth at bedtime as needed for sleep. (Patient not taking: Reported on 11/15/2019)   No facility-administered encounter medications on file as of 01/05/2020.    Goals      Client will talk with Susan Contreras in next 30 days to discuss anxiety /stress issues of concern for patinet at this time (pt-stated)     [place] (see longtitudinal plan of care for additional care plan information)  Current Barriers:  Mental health issues of concern (anxiety/stress) in client with Chronic Diagnoses of HTN, GAD, GERD, and HLD Decreased energy Transportation challenges.  Clinical Social Work Clinical Goal(s):  Susan Contreras will talk with client in next 30 days to discuss client managenet of anxiety or stress issues.   Interventions: Talked with client about CCM program support services Talked with client about weight issue of client  Talked with Susan Contreras about Susan Contreras psychiatric referral for client  Talked with Susan Contreras about anxiety or stress issues she faces.    Talked with Susan Contreras about transport needs of client Talked with client about sleeping challenges Talked with Susan Contreras about her recent visit with psychiatrist, Susan Contreras Talked with client about her upcoming medical appointments Talked with client about pain issues of client Talked with client  about relaxation techniques of choice (likes to do yardwork, likes to be outdoors on pretty weather days) Talked with client about her social support network (has support from her spouse,sons) Talked with client about her completion of ADLs  Patient Self Care Activities:  Takes medications as prescribed Attends scheduled medical appointments  Patient SELF CARE DEFICITS: Transportation challenges Sleeping challenges Mental health issues   Initial goal documentation        Follow Up Plan: Susan Contreras to call client in next 4 weeks to talk with her about client management of anxiety and stress issues faced by client  Susan Contreras.Susan Contreras MSW, Susan Contreras Licensed Clinical Social Worker [facility] Care Management [phone]  I have reviewed and agree with the above  documentation.   Susan Dun, FNP

## 2020-01-05 NOTE — Patient Instructions (Addendum)
Licensed Clinical Social Worker Visit Information  Goals we discussed today:  Goals    . Client will talk with LCSW in next 30 days to discuss anxiety /stress issues of concern for patinet at this time (pt-stated)     [place] (see longtitudinal plan of care for additional care plan information)  Current Barriers:  Marland Kitchen Mental health issues of concern (anxiety/stress) in client with Chronic Diagnoses of HTN, GAD, GERD, and HLD . Decreased energy . Transportation challenges.  Clinical Social Work Clinical Goal(s):  Marland Kitchen LCSW will talk with client in next 30 days to discuss client managenet of anxiety or stress issues.   Interventions:  Talked with client about CCM program support services  Talked with client about weight issue of client  Talked with Kandis about [name] PA psychiatric referral for client  Talked with Arha about anxiety or stress issues she faces.  Talked with [name] about transport needs of client  Talked with client about sleeping challenges  Talked with Rashunda about her recent visit with psychiatrist, Dr. Modesta Messing  Talked with client about her upcoming medical appointments  Talked with client about pain issues of client  Talked with client about relaxation techniques of choice (likes to do yardwork, likes to be outdoors on pretty weather days)  Talked with client about her social support network (has support from her spouse,sons)  Talked with client about her completion of ADLs  Patient Self Care Activities:  Takes medications as prescribed Attends scheduled medical appointments  Patient SELF CARE DEFICITS: Transportation challenges Sleeping challenges Mental health issues  Initial goal documentation       Materials Provided: No  Follow Up Plan:LCSW to call client in next 4 weeks to talk with her about client management of anxiety and stress issues faced by client  The patient verbalized understanding of instructions provided today and declined  a print copy of patient instruction materials.   Norva Riffle.Yaremi Stahlman MSW, LCSW Licensed Clinical Social Worker [facility] Care Management [phone]

## 2020-01-09 ENCOUNTER — Other Ambulatory Visit: Payer: Self-pay | Admitting: Family

## 2020-01-09 ENCOUNTER — Telehealth: Payer: Self-pay | Admitting: *Deleted

## 2020-01-09 ENCOUNTER — Ambulatory Visit: Payer: Medicare HMO | Admitting: *Deleted

## 2020-01-09 ENCOUNTER — Telehealth (HOSPITAL_COMMUNITY): Payer: Self-pay

## 2020-01-09 DIAGNOSIS — E039 Hypothyroidism, unspecified: Secondary | ICD-10-CM | POA: Diagnosis not present

## 2020-01-09 DIAGNOSIS — E785 Hyperlipidemia, unspecified: Secondary | ICD-10-CM | POA: Diagnosis not present

## 2020-01-09 DIAGNOSIS — F411 Generalized anxiety disorder: Secondary | ICD-10-CM

## 2020-01-09 DIAGNOSIS — I10 Essential (primary) hypertension: Secondary | ICD-10-CM | POA: Diagnosis not present

## 2020-01-09 NOTE — Telephone Encounter (Signed)
SPOKE WITH PATIENT  & ADVISED PER PROVIDER: Given she will return to our clinic only as needed, advise her to discuss with her PCP.   PATIENT INFORMED THAT SHE DOESN'T THINK SHE EXPLAINED HERSELF CORRECTLY @ APPOINTMENT && THAT SHE'S SEEING HER PCP THIS WEEK FOR LABS TO BE DONE. AND THAT SHE WILL NEED TO CONTINUE REGULAR OFFICE VISITS OTHER THAN A AS NEEDED BASIS.

## 2020-01-09 NOTE — Chronic Care Management (AMB) (Addendum)
Chronic Care Management   Follow Up Note   01/09/2020 Name: Susan Contreras MRN: [id number] DOB: 12-Dec-1940  Referred by: [name], FNP Reason for referral : Chronic Care Management (Incoming patient call)   Susan Contreras is a 79 y.o. year old female who is a primary care patient of [name], FNP. The CCM team was consulted for assistance with chronic disease management and care coordination needs.    Review of patient status, including review of consultants reports, relevant laboratory and other test results, and collaboration with appropriate care team members and the patient's provider was performed as part of comprehensive patient evaluation and provision of chronic care management services.    I spoke with Susan Contreras today by telephone regarding management of her chronic medical conditions.   SDOH (Social Determinants of Health) assessments performed: No See Care Plan activities for detailed interventions related to [facility])      Outpatient Encounter Medications as of 01/09/2020  Medication Sig   ALPRAZolam (XANAX) 0.25 MG tablet TAKE 1 TABLET TWICE DAILY AS NEEDED FOR ANXIETY   atorvastatin (LIPITOR) 20 MG tablet Take 1 tablet (20 mg total) by mouth daily.   cetirizine (ZYRTEC) 10 MG tablet Take 1 tablet (10 mg total) by mouth daily.   Chlorphen-PE-Acetaminophen (NOREL AD PO) Take 1 tablet by mouth as needed.    fluticasone (FLONASE) 50 MCG/ACT nasal spray Place 1-2 sprays into both nostrils as needed for allergies or rhinitis.   irbesartan (AVAPRO) 75 MG tablet TAKE 1 TABLET DAILY   levothyroxine (SYNTHROID) 50 MCG tablet Take 1 tablet (50 mcg total) by mouth daily.   meclizine (ANTIVERT) 25 MG tablet TAKE ONE TABLET BY MOUTH THREE TIMES DAILY AS NEEDED FOR DIZZINESS (Patient taking differently: Take 25 mg by mouth 3 (three) times daily as needed for dizziness. )   pantoprazole (PROTONIX) 20 MG tablet Take 20 mg by mouth 2 (two) times daily.   traZODone (DESYREL) 50 MG  tablet Take 0.5-1 tablets (25-50 mg total) by mouth at bedtime as needed for sleep. (Patient not taking: Reported on 11/15/2019)   No facility-administered encounter medications on file as of 01/09/2020.     Objective:   RN Care Manager     "I need help managing my anxiety" (pt-stated)       CARE PLAN ENTRY (see longitudinal plan of care for additional care plan information)  Anxiety in a patient with HTN, GAD, GERD, HLD, Hypothyroidism  Current Barriers:  Chronic Disease Management support and education needs related to anxiety  Nurse Case Manager Clinical Goal(s):  Over the next 30 days, patient will talk with PCP regarding anxiety management Over the next 30 days, patient will talk with LCSW regarding stress and anxiety management strategies  Interventions:  Inter-disciplinary care team collaboration (see longitudinal plan of care) Chart reviewed including office notes and lab results Reviewed most recent behavioral health note as well as telephone call to behavioral health today regarding xanax refill Discussed behavioral health's recommendation of Zoloft.  Patient is hesitant to try anything other than Xanax Previously discussed: GAD symptoms with patient stress in life family support hobbies and physical limitations that keep her from doing these hobbies Encouraged patient to talk with LCSW regarding psychosocial issues Patient previously provided with CCM contact information and encouraged to reach out as needed Telephone message sent to [facility] requesting that they contact patient to schedule a follow-up appt with PCP, [name], FNP  Patient Self Care Activities:  Performs ADL's independently  Performs IADL's independently  Please see past updates related to this goal by clicking on the "Past Updates" button in the selected goal           Plan:   The care management team will reach out to the patient again over the next 30 days.    Chong Sicilian, BSN, RN-BC Embedded Chronic Care Manager [facility] [place] Management Direct Dial: [phone]    I have reviewed and agree with the above documentation.   [name], FNP

## 2020-01-09 NOTE — Telephone Encounter (Signed)
Given she will return to our clinic only as needed, advise her to discuss with her PCP.

## 2020-01-09 NOTE — Telephone Encounter (Signed)
Please advise her that I will not prescribe Xanax at this time. She may need to contact her PCP if she needs refill.

## 2020-01-09 NOTE — Telephone Encounter (Signed)
Spoke with Patient & Advised Per Provider: That you had recalled @ last visit medication for anxiety was declined at that visit, && she can start sertraline if she is interested.  Patient wants to stay ONLY on her ANXIETY medication Xanax

## 2020-01-09 NOTE — Telephone Encounter (Signed)
Although I recall that she was not interested in starting antidepressant/medication for anxiety at her last visit, she can start sertraline if she is interested. Let me know if she is, and schedule her in about six weeks.

## 2020-01-09 NOTE — Telephone Encounter (Signed)
SPOKE with PATIENT & INFORMED PER PROVIDER :: Please advise her that I will not prescribe Xanax at this time. She may need to contact her PCP if she needs refill.

## 2020-01-09 NOTE — Telephone Encounter (Signed)
Appointment made for 01/20/2020 at 9:40am with [name], FNP

## 2020-01-09 NOTE — Patient Instructions (Signed)
Visit Information  Goals Addressed            This Visit's Progress     Patient Stated   . "I need help managing my anxiety" (pt-stated)       CARE PLAN ENTRY (see longitudinal plan of care for additional care plan information)  Anxiety in a patient with HTN, GAD, GERD, HLD, Hypothyroidism  Current Barriers:  . Chronic Disease Management support and education needs related to anxiety  Nurse Case Manager Clinical Goal(s):  Marland Kitchen Over the next 30 days, patient will talk with PCP regarding anxiety management . Over the next 30 days, patient will talk with LCSW regarding stress and anxiety management strategies  Interventions:  . Inter-disciplinary care team collaboration (see longitudinal plan of care) . Chart reviewed including office notes and lab results o Reviewed most recent behavioral health note as well as telephone call to behavioral health today regarding xanax refill . Discussed behavioral health's recommendation of Zoloft.  o Patient is hesitant to try anything other than Xanax . Previously discussed: o GAD symptoms with patient o stress in life o family support o hobbies and physical limitations that keep her from doing these hobbies . Encouraged patient to talk with LCSW regarding psychosocial issues . Patient previously provided with CCM contact information and encouraged to reach out as needed . Telephone message sent to [facility] requesting that they contact patient to schedule a follow-up appt with PCP, [name], FNP  Patient Self Care Activities:  . Performs ADL's independently . Performs IADL's independently  Please see past updates related to this goal by clicking on the "Past Updates" button in the selected goal         The patient verbalized understanding of instructions provided today and declined a print copy of patient instruction materials.   Follow-up Plan The care management team will reach out to the patient again over the next  30 days.   Chong Sicilian, BSN, RN-BC Embedded Chronic Care Manager [facility] [place] Management Direct Dial: [phone]

## 2020-01-09 NOTE — Telephone Encounter (Signed)
Patient called and stated that she needs a medication prescribed to her for anxiety. She wants to know if you can do that or if she should have her PCP prescribe something and manage her medication? Please review and advise. Thank you.

## 2020-01-09 NOTE — Telephone Encounter (Signed)
01/09/2020  Please call to schedule patient a follow-up appointment with [facility] within the next couple of weeks. She saw psychiatrist who recommended she follow-up with [facility] for medication refills. See behavioral health telephone call thread from today for more information.   Patient is coming in on 01/12/20 but for labs only. She is aware that she is not scheduled with [facility] at that time.   Chong Sicilian, BSN, RN-BC Embedded Chronic Care Manager [facility] [place] [facility] Direct Dial: [phone]

## 2020-01-12 ENCOUNTER — Other Ambulatory Visit: Payer: Medicare HMO

## 2020-01-12 ENCOUNTER — Other Ambulatory Visit: Payer: Self-pay

## 2020-01-12 DIAGNOSIS — E039 Hypothyroidism, unspecified: Secondary | ICD-10-CM

## 2020-01-13 ENCOUNTER — Ambulatory Visit: Payer: Medicare HMO | Admitting: *Deleted

## 2020-01-13 DIAGNOSIS — I10 Essential (primary) hypertension: Secondary | ICD-10-CM | POA: Diagnosis not present

## 2020-01-13 DIAGNOSIS — E785 Hyperlipidemia, unspecified: Secondary | ICD-10-CM

## 2020-01-13 DIAGNOSIS — E039 Hypothyroidism, unspecified: Secondary | ICD-10-CM

## 2020-01-13 LAB — TSH: TSH: 2.32 u[IU]/mL (ref 0.450–4.500)

## 2020-01-16 ENCOUNTER — Other Ambulatory Visit: Payer: Self-pay | Admitting: Family

## 2020-01-17 NOTE — Patient Instructions (Signed)
Visit Information  Goals Addressed            This Visit's Progress     Patient Stated   . "I need help managing my thyroid" (pt-stated)       CARE PLAN ENTRY (see longitudinal plan of care for additional care plan information)  Current Barriers:  . Chronic Disease Management support and education needs related to hypothyroidism  Nurse Case Manager Clinical Goal(s):  Marland Kitchen Over the next 90 days, patient will keep all scheduled medical appointments  Interventions:  . Inter-disciplinary care team collaboration (see longitudinal plan of care) . Previously reviewed medications with patient and discussed change from levothyroxine 28mcg to 34mcg . Chart reviewed o TSH drawn on 01/12/20 was normal  . Previously provided patient with RN CM contact number and encouraged to reach out as needed . Encouraged patient to reach out to PCP with any new or worsening symptoms . Reviewed upcoming appt: [name], FNP on 01/20/20  Patient Self Care Activities:  . Performs ADL's independently . Performs IADL's independently  Please see past updates related to this goal by clicking on the "Past Updates" button in the selected goal         The patient verbalized understanding of instructions provided today and declined a print copy of patient instruction materials.   Follow-up Plan The care management team will reach out to the patient again over the next 30 days.   Chong Sicilian, BSN, RN-BC Embedded Chronic Care Manager [facility] [place] [facility] Direct Dial: [phone]

## 2020-01-17 NOTE — Chronic Care Management (AMB) (Addendum)
Chronic Care Management   Follow Up Note   01/13/2020 Name: Susan Contreras MRN: [id number] DOB: [birth year]  Referred by: [name], FNP Reason for referral : Chronic Care Management   Susan Contreras is a 79 y.o. year old female who is a primary care patient of [name], FNP. The CCM team was consulted for assistance with chronic disease management and care coordination needs.    Review of patient status, including review of consultants reports, relevant laboratory and other test results, and collaboration with appropriate care team members and the patient's provider was performed as part of comprehensive patient evaluation and provision of chronic care management services.    I talked with Susan Contreras by telephone today regarding management of her chronic medical conditions.   SDOH (Social Determinants of Health) assessments performed: No See Care Plan activities for detailed interventions related to [facility])      Outpatient Encounter Medications as of 01/13/2020  Medication Sig   ALPRAZolam (XANAX) 0.25 MG tablet TAKE 1 TABLET TWICE DAILY AS NEEDED FOR ANXIETY   atorvastatin (LIPITOR) 20 MG tablet Take 1 tablet (20 mg total) by mouth daily.   cetirizine (ZYRTEC) 10 MG tablet Take 1 tablet (10 mg total) by mouth daily.   Chlorphen-PE-Acetaminophen (NOREL AD PO) Take 1 tablet by mouth as needed.    fluticasone (FLONASE) 50 MCG/ACT nasal spray Place 1-2 sprays into both nostrils as needed for allergies or rhinitis.   irbesartan (AVAPRO) 75 MG tablet TAKE 1 TABLET DAILY   levothyroxine (SYNTHROID) 50 MCG tablet Take 1 tablet (50 mcg total) by mouth daily.   meclizine (ANTIVERT) 25 MG tablet TAKE ONE TABLET BY MOUTH THREE TIMES DAILY AS NEEDED FOR DIZZINESS (Patient taking differently: Take 25 mg by mouth 3 (three) times daily as needed for dizziness. )   traZODone (DESYREL) 50 MG tablet Take 0.5-1 tablets (25-50 mg total) by mouth at bedtime as needed for sleep. (Patient not taking:  Reported on 11/15/2019)   [DISCONTINUED] pantoprazole (PROTONIX) 20 MG tablet Take 20 mg by mouth 2 (two) times daily.   No facility-administered encounter medications on file as of 01/13/2020.    Lab Results  Component Value Date   TSH 2.320 01/12/2020     RN Care Plan     "I need help managing my thyroid" (pt-stated)       CARE PLAN ENTRY (see longitudinal plan of care for additional care plan information)  Current Barriers:  Chronic Disease Management support and education needs related to hypothyroidism  Nurse Case Manager Clinical Goal(s):  Over the next 90 days, patient will keep all scheduled medical appointments  Interventions:  Inter-disciplinary care team collaboration (see longitudinal plan of care) Previously reviewed medications with patient and discussed change from levothyroxine 80mcg to 58mcg Chart reviewed TSH drawn on 01/12/20 was normal  Previously provided patient with RN CM contact number and encouraged to reach out as needed Encouraged patient to reach out to PCP with any new or worsening symptoms Reviewed upcoming appt: [name], FNP on 01/20/20  Patient Self Care Activities:  Performs ADL's independently Performs IADL's independently  Please see past updates related to this goal by clicking on the "Past Updates" button in the selected goal           Plan:   The care management team will reach out to the patient again over the next 30 days.    Chong Sicilian, BSN, RN-BC Embedded Chronic Care Manager [facility] [place] Management  Direct Dial: [phone]    I have reviewed and agree with the above  documentation.   [name], FNP

## 2020-01-20 ENCOUNTER — Encounter: Payer: Self-pay | Admitting: Family

## 2020-01-20 ENCOUNTER — Ambulatory Visit (INDEPENDENT_AMBULATORY_CARE_PROVIDER_SITE_OTHER): Payer: Medicare HMO | Admitting: Family

## 2020-01-20 ENCOUNTER — Other Ambulatory Visit: Payer: Self-pay

## 2020-01-20 ENCOUNTER — Other Ambulatory Visit: Payer: Self-pay | Admitting: Family

## 2020-01-20 VITALS — BP 129/74 | HR 74 | Temp 97.5°F | Ht 61.0 in | Wt 148.5 lb

## 2020-01-20 DIAGNOSIS — E669 Obesity, unspecified: Secondary | ICD-10-CM | POA: Diagnosis not present

## 2020-01-20 DIAGNOSIS — K2100 Gastro-esophageal reflux disease with esophagitis, without bleeding: Secondary | ICD-10-CM | POA: Diagnosis not present

## 2020-01-20 DIAGNOSIS — F1320 Sedative, hypnotic or anxiolytic dependence, uncomplicated: Secondary | ICD-10-CM

## 2020-01-20 DIAGNOSIS — E785 Hyperlipidemia, unspecified: Secondary | ICD-10-CM | POA: Diagnosis not present

## 2020-01-20 DIAGNOSIS — F411 Generalized anxiety disorder: Secondary | ICD-10-CM

## 2020-01-20 DIAGNOSIS — J019 Acute sinusitis, unspecified: Secondary | ICD-10-CM | POA: Diagnosis not present

## 2020-01-20 DIAGNOSIS — Z79899 Other long term (current) drug therapy: Secondary | ICD-10-CM

## 2020-01-20 DIAGNOSIS — I10 Essential (primary) hypertension: Secondary | ICD-10-CM

## 2020-01-20 DIAGNOSIS — E039 Hypothyroidism, unspecified: Secondary | ICD-10-CM | POA: Diagnosis not present

## 2020-01-20 MED ORDER — AMOXICILLIN 500 MG PO CAPS
500.0000 mg | ORAL_CAPSULE | Freq: Two times a day (BID) | ORAL | 0 refills | Status: DC
Start: 2020-01-20 — End: 2020-02-07

## 2020-01-20 MED ORDER — ALPRAZOLAM 0.25 MG PO TABS: ORAL_TABLET | ORAL | 5 refills | Status: DC

## 2020-01-20 NOTE — Progress Notes (Signed)
Subjective:    Patient ID: Susan Contreras, female    DOB: 1940/09/20, 79 y.o.   MRN: [id number]  Chief Complaint  Patient presents with  . Medical Management of Chronic Issues    pt here today for 2 month follow up on her thyroid   Pt presents to the office today for chronic follow up. She is followed by GI every 6 months.  Gastroesophageal Reflux She complains of belching, heartburn and a sore throat. This is a chronic problem. The current episode started more than 1 year ago. The problem occurs occasionally. The symptoms are aggravated by certain foods. Associated symptoms include fatigue. She has tried a PPI for the symptoms. The treatment provided moderate relief.  Anxiety Presents for follow-up visit. Symptoms include depressed mood, excessive worry, irritability, nervous/anxious behavior and restlessness. Patient reports no shortness of breath. Symptoms occur occasionally. The severity of symptoms is moderate.    Thyroid Problem Presents for follow-up visit. Symptoms include anxiety, depressed mood, fatigue and hair loss. The symptoms have been improving. Her past medical history is significant for hyperlipidemia.  Hyperlipidemia This is a chronic problem. The current episode started more than 1 year ago. The problem is uncontrolled. Recent lipid tests were reviewed and are high. Exacerbating diseases include obesity. Pertinent negatives include no shortness of breath. Current antihyperlipidemic treatment includes statins. The current treatment provides moderate improvement of lipids. Risk factors for coronary artery disease include dyslipidemia, hypertension, a sedentary lifestyle and post-menopausal.  Hypertension This is a chronic problem. The current episode started more than 1 year ago. The problem has been resolved since onset. Associated symptoms include anxiety, headaches and malaise/fatigue. Pertinent negatives include no peripheral edema or shortness of breath. Risk factors for  coronary artery disease include dyslipidemia, obesity and sedentary lifestyle. The current treatment provides moderate improvement. There is no history of CAD/MI. Identifiable causes of hypertension include a thyroid problem.  Sinusitis This is a recurrent problem. The current episode started more than 1 month ago. The problem has been waxing and waning since onset. There has been no fever. Associated symptoms include congestion, ear pain, headaches, sinus pressure and a sore throat. Pertinent negatives include no chills or shortness of breath. Past treatments include oral decongestants. The treatment provided mild relief.      Review of Systems  Constitutional: Positive for fatigue, irritability and malaise/fatigue. Negative for chills.  HENT: Positive for congestion, ear pain, sinus pressure and sore throat.   Respiratory: Negative for shortness of breath.   Gastrointestinal: Positive for heartburn.  Neurological: Positive for headaches.  Psychiatric/Behavioral: The patient is nervous/anxious.   All other systems reviewed and are negative.      Objective:   Physical Exam Vitals reviewed.  Constitutional:      General: She is not in acute distress.    Appearance: She is well-developed.  HENT:     Head: Normocephalic and atraumatic.     Right Ear: Tympanic membrane normal.     Left Ear: Tympanic membrane normal.     Mouth/Throat:     Pharynx: Posterior oropharyngeal erythema present.  Eyes:     Pupils: Pupils are equal, round, and reactive to light.  Neck:     Thyroid: No thyromegaly.  Cardiovascular:     Rate and Rhythm: Normal rate and regular rhythm.     Heart sounds: Normal heart sounds. No murmur.  Pulmonary:     Effort: Pulmonary effort is normal. No respiratory distress.     Breath sounds: Normal breath sounds. No  wheezing.  Abdominal:     General: Bowel sounds are normal. There is no distension.     Palpations: Abdomen is soft.     Tenderness: There is no abdominal  tenderness.  Musculoskeletal:        General: No tenderness. Normal range of motion.     Cervical back: Normal range of motion and neck supple.  Skin:    General: Skin is warm and dry.  Neurological:     Mental Status: She is alert and oriented to person, place, and time.     Cranial Nerves: No cranial nerve deficit.     Deep Tendon Reflexes: Reflexes are normal and symmetric.  Psychiatric:        Behavior: Behavior normal.        Thought Content: Thought content normal.        Judgment: Judgment normal.      BP 129/74   Pulse 74   Temp (!) 97.5 F (36.4 C) (Temporal)   Ht 5\' 1"  (1.549 m)   Wt 148 lb 8 oz (67.4 kg)   BMI 28.06 kg/m      Assessment & Plan:  Susan Contreras comes in today with chief complaint of Medical Management of Chronic Issues (pt here today for 2 month follow up on her thyroid)   Diagnosis and orders addressed:  1. Essential hypertension  2. Gastroesophageal reflux disease with esophagitis, unspecified whether hemorrhage  3. Obesity (BMI 30.0-34.9)  4. Hyperlipidemia, unspecified hyperlipidemia type  5. Controlled substance agreement signed - ALPRAZolam (XANAX) 0.25 MG tablet; TAKE 1 TABLET TWICE DAILY AS NEEDED FOR ANXIETY  Dispense: 60 tablet; Refill: 5  6. Benzodiazepine dependence (HCC) - ALPRAZolam (XANAX) 0.25 MG tablet; TAKE 1 TABLET TWICE DAILY AS NEEDED FOR ANXIETY  Dispense: 60 tablet; Refill: 5  7. Hypothyroidism, unspecified type  8. GAD (generalized anxiety disorder) Pt reviewed in [place] controlled database- No red flags, contract and drug screen up to date.  - ALPRAZolam (XANAX) 0.25 MG tablet; TAKE 1 TABLET TWICE DAILY AS NEEDED FOR ANXIETY  Dispense: 60 tablet; Refill: 5   Labs pending Health Maintenance reviewed Diet and exercise encouraged  Follow up plan: 6 months    Susan Dun, FNP

## 2020-01-20 NOTE — Addendum Note (Signed)
Addended by: [name] A on: 01/20/2020 10:14 AM   Modules accepted: Orders

## 2020-01-20 NOTE — Patient Instructions (Signed)
Hypothyroidism  Hypothyroidism is when the thyroid gland does not make enough of certain hormones (it is underactive). The thyroid gland is a small gland located in the lower front part of the neck, just in front of the windpipe (trachea). This gland makes hormones that help control how the body uses food for energy (metabolism) as well as how the heart and brain function. These hormones also play a role in keeping your bones strong. When the thyroid is underactive, it produces too little of the hormones thyroxine (T4) and triiodothyronine (T3). What are the causes? This condition may be caused by:  Hashimoto's disease. This is a disease in which the body's disease-fighting system (immune system) attacks the thyroid gland. This is the most common cause.  Viral infections.  Pregnancy.  Certain medicines.  Birth defects.  Past radiation treatments to the head or neck for cancer.  Past treatment with radioactive iodine.  Past exposure to radiation in the environment.  Past surgical removal of part or all of the thyroid.  Problems with a gland in the center of the brain (pituitary gland).  Lack of enough iodine in the diet. What increases the risk? You are more likely to develop this condition if:  You are female.  You have a family history of thyroid conditions.  You use a medicine called lithium.  You take medicines that affect the immune system (immunosuppressants). What are the signs or symptoms? Symptoms of this condition include:  Feeling as though you have no energy (lethargy).  Not being able to tolerate cold.  Weight gain that is not explained by a change in diet or exercise habits.  Lack of appetite.  Dry skin.  Coarse hair.  Menstrual irregularity.  Slowing of thought processes.  Constipation.  Sadness or depression. How is this diagnosed? This condition may be diagnosed based on:  Your symptoms, your medical history, and a physical exam.  Blood  tests. You may also have imaging tests, such as an ultrasound or MRI. How is this treated? This condition is treated with medicine that replaces the thyroid hormones that your body does not make. After you begin treatment, it may take several weeks for symptoms to go away. Follow these instructions at home:  Take over-the-counter and prescription medicines only as told by your health care provider.  If you start taking any new medicines, tell your health care provider.  Keep all follow-up visits as told by your health care provider. This is important. ? As your condition improves, your dosage of thyroid hormone medicine may change. ? You will need to have blood tests regularly so that your health care provider can monitor your condition. Contact a health care provider if:  Your symptoms do not get better with treatment.  You are taking thyroid replacement medicine and you: ? Sweat a lot. ? Have tremors. ? Feel anxious. ? Lose weight rapidly. ? Cannot tolerate heat. ? Have emotional swings. ? Have diarrhea. ? Feel weak. Get help right away if you have:  Chest pain.  An irregular heartbeat.  A rapid heartbeat.  Difficulty breathing. Summary  Hypothyroidism is when the thyroid gland does not make enough of certain hormones (it is underactive).  When the thyroid is underactive, it produces too little of the hormones thyroxine (T4) and triiodothyronine (T3).  The most common cause is Hashimoto's disease, a disease in which the body's disease-fighting system (immune system) attacks the thyroid gland. The condition can also be caused by viral infections, medicine, pregnancy, or past   radiation treatment to the head or neck.  Symptoms may include weight gain, dry skin, constipation, feeling as though you do not have energy, and not being able to tolerate cold.  This condition is treated with medicine to replace the thyroid hormones that your body does not make. This information  is not intended to replace advice given to you by your health care provider. Make sure you discuss any questions you have with your health care provider. Document Revised: 07/24/2017 Document Reviewed: 07/22/2017 Elsevier Patient Education  2020 Elsevier Inc.  

## 2020-01-24 ENCOUNTER — Telehealth: Payer: Self-pay | Admitting: Family

## 2020-01-24 MED ORDER — FAMOTIDINE 20 MG PO TABS
20.0000 mg | ORAL_TABLET | Freq: Two times a day (BID) | ORAL | 2 refills | Status: DC
Start: 2020-01-24 — End: 2020-03-09

## 2020-01-24 NOTE — Telephone Encounter (Signed)
Patient aware and verbalized understanding. °

## 2020-01-24 NOTE — Telephone Encounter (Signed)
Pepcid 20 mg BID added, Prescription sent to pharmacy. Avoid fried, spicy, citrus foods, caffeine and alcohol, Do not eat 2-3 hours before bedtime, Encouraged small frequent meals, Avoid NSAID's.

## 2020-02-07 ENCOUNTER — Ambulatory Visit (INDEPENDENT_AMBULATORY_CARE_PROVIDER_SITE_OTHER): Payer: Medicare HMO | Admitting: Family Medicine

## 2020-02-07 ENCOUNTER — Encounter: Payer: Self-pay | Admitting: Family Medicine

## 2020-02-07 ENCOUNTER — Ambulatory Visit: Payer: Medicare HMO | Admitting: Physician Assistant

## 2020-02-07 DIAGNOSIS — J019 Acute sinusitis, unspecified: Secondary | ICD-10-CM | POA: Diagnosis not present

## 2020-02-07 MED ORDER — AMOXICILLIN 500 MG PO CAPS: 500.0000 mg | ORAL_CAPSULE | Freq: Two times a day (BID) | ORAL | 0 refills | Status: DC

## 2020-02-07 MED ORDER — METHYLPREDNISOLONE 4 MG PO TBPK: ORAL_TABLET | ORAL | 0 refills | Status: DC

## 2020-02-07 NOTE — Progress Notes (Signed)
Virtual Visit via Telephone Note  I connected with Susan Contreras on 02/07/20 at 4:23 PM by telephone and verified that I am speaking with the correct person using two identifiers. Susan Contreras is currently located at home and nobody is currently with her during this visit. The provider, Loman Brooklyn, FNP is located in their home at time of visit.  I discussed the limitations, risks, security and privacy concerns of performing an evaluation and management service by telephone and the availability of in person appointments. I also discussed with the patient that there may be a patient responsible charge related to this service. The patient expressed understanding and agreed to proceed.  Subjective: PCP: [name], FNP  Chief Complaint  Patient presents with  . Sinusitis   Patient complains of cough, head/chest congestion, headache, runny nose, ear pain/pressure, facial pain/pressure and postnasal drainage. Onset of symptoms was a few weeks ago, gradually worsening since that time. She is drinking plenty of fluids. Evaluation to date: previously evaluated and given Amoxicillin. Treatment to date: antibiotics, antihistamines, cough suppressants, decongestants and nasal steroids. She has a history of allergies. She does not smoke.    ROS: Per HPI  Current Outpatient Medications:  .  ALPRAZolam (XANAX) 0.25 MG tablet, TAKE 1 TABLET TWICE DAILY AS NEEDED FOR ANXIETY, Disp: 60 tablet, Rfl: 5 .  atorvastatin (LIPITOR) 20 MG tablet, Take 1 tablet (20 mg total) by mouth daily., Disp: 90 tablet, Rfl: 3 .  Chlorphen-PE-Acetaminophen (NOREL AD PO), Take 1 tablet by mouth as needed. , Disp: , Rfl:  .  famotidine (PEPCID) 20 MG tablet, Take 1 tablet (20 mg total) by mouth 2 (two) times daily., Disp: 60 tablet, Rfl: 2 .  fluticasone (FLONASE) 50 MCG/ACT nasal spray, Place 1-2 sprays into both nostrils as needed for allergies or rhinitis., Disp: , Rfl:  .  irbesartan (AVAPRO) 75 MG tablet, TAKE 1  TABLET DAILY, Disp: 90 tablet, Rfl: 0 .  levothyroxine (SYNTHROID) 50 MCG tablet, Take 1 tablet (50 mcg total) by mouth daily., Disp: 90 tablet, Rfl: 3 .  meclizine (ANTIVERT) 25 MG tablet, TAKE ONE TABLET BY MOUTH THREE TIMES DAILY AS NEEDED FOR DIZZINESS (Patient taking differently: Take 25 mg by mouth 3 (three) times daily as needed for dizziness. ), Disp: 30 tablet, Rfl: 0 .  pantoprazole (PROTONIX) 20 MG tablet, TAKE (1) TABLET TWICE A DAY BEFORE MEALS., Disp: 60 tablet, Rfl: 0  Allergies  Allergen Reactions  . Biaxin [Clarithromycin]   . Cephalexin   . Ciprofloxacin   . Doxycycline     Headaches  . Flagyl [Metronidazole Hcl]   . Ketek [Telithromycin]   . Sulfa Antibiotics    Past Medical History:  Diagnosis Date  . Anxiety   . Depression   . Diverticulosis   . Esophageal stricture   . Family hx of colorectal cancer   . GERD (gastroesophageal reflux disease)   . Helicobacter pylori (H. pylori) 1997   EGD  . Hemorrhoids   . Hiatal hernia   . Hx of adenomatous colonic polyps   . Hyperlipemia   . Hypertension   . PUD (peptic ulcer disease) 1997   EGD  . Thyroid disease   . Vertigo     Observations/Objective: A&O  No respiratory distress or wheezing audible over the phone Mood, judgement, and thought processes all WNL  Assessment and Plan: 1. Acute non-recurrent sinusitis, unspecified location - Patient not agreeable to any other antibiotic besides Amoxicillin. A second coarse was given to  clear sinus infection. - methylPREDNISolone (MEDROL DOSEPAK) 4 MG TBPK tablet; Use as directed on package.  Dispense: 21 each; Refill: 0 - amoxicillin (AMOXIL) 500 MG capsule; Take 1 capsule (500 mg total) by mouth 2 (two) times daily.  Dispense: 14 capsule; Refill: 0   Follow Up Instructions:  I discussed the assessment and treatment plan with the patient. The patient was provided an opportunity to ask questions and all were answered. The patient agreed with the plan and  demonstrated an understanding of the instructions.   The patient was advised to call back or seek an in-person evaluation if the symptoms worsen or if the condition fails to improve as anticipated.  The above assessment and management plan was discussed with the patient. The patient verbalized understanding of and has agreed to the management plan. Patient is aware to call the clinic if symptoms persist or worsen. Patient is aware when to return to the clinic for a follow-up visit. Patient educated on when it is appropriate to go to the emergency department.   Time call ended: 4:51 PM  I provided 33 minutes of non-face-to-face time during this encounter.  Hendricks Limes, MSN, APRN, FNP-C [facility] 02/07/20

## 2020-02-10 ENCOUNTER — Ambulatory Visit (INDEPENDENT_AMBULATORY_CARE_PROVIDER_SITE_OTHER): Payer: Medicare HMO | Admitting: Licensed Clinical Social Worker

## 2020-02-10 DIAGNOSIS — F411 Generalized anxiety disorder: Secondary | ICD-10-CM

## 2020-02-10 DIAGNOSIS — I10 Essential (primary) hypertension: Secondary | ICD-10-CM | POA: Diagnosis not present

## 2020-02-10 DIAGNOSIS — E785 Hyperlipidemia, unspecified: Secondary | ICD-10-CM | POA: Diagnosis not present

## 2020-02-10 DIAGNOSIS — E039 Hypothyroidism, unspecified: Secondary | ICD-10-CM | POA: Diagnosis not present

## 2020-02-10 DIAGNOSIS — K219 Gastro-esophageal reflux disease without esophagitis: Secondary | ICD-10-CM

## 2020-02-10 NOTE — Chronic Care Management (AMB) (Addendum)
Chronic Care Management    Clinical Social Work Follow Up Note  02/10/2020 Name: Susan Contreras MRN: [id number] DOB: September 15, 1940  Susan Contreras is a 79 y.o. year old female who is a primary care patient of [name], FNP. The CCM team was consulted for assistance with [name] .   Review of patient status, including review of consultants reports, other relevant assessments, and collaboration with appropriate care team members and the patient's provider was performed as part of comprehensive patient evaluation and provision of chronic care management services.    SDOH (Social Determinants of Health) assessments performed: No; risk for tobacco use; risk for depression; risk for stress; risk for transport needs    Chronic Care Management from 11/08/2019 in [place]  PHQ-9 Total Score 8       GAD 7 : Generalized Anxiety Score 01/20/2020 11/08/2019 11/02/2019  Nervous, Anxious, on Edge 0 1 1  Control/stop worrying 0 1 0  Worry too much - different things 0 1 0  Trouble relaxing 0 1 0  Restless 0 0 0  Easily annoyed or irritable 0 1 0  Afraid - awful might happen 0 0 0  Total GAD 7 Score 0 5 1  Anxiety Difficulty Not difficult at all Somewhat difficult -     Outpatient Encounter Medications as of 02/10/2020  Medication Sig   ALPRAZolam (XANAX) 0.25 MG tablet TAKE 1 TABLET TWICE DAILY AS NEEDED FOR ANXIETY   amoxicillin (AMOXIL) 500 MG capsule Take 1 capsule (500 mg total) by mouth 2 (two) times daily.   atorvastatin (LIPITOR) 20 MG tablet Take 1 tablet (20 mg total) by mouth daily.   Chlorphen-PE-Acetaminophen (NOREL AD PO) Take 1 tablet by mouth as needed.    famotidine (PEPCID) 20 MG tablet Take 1 tablet (20 mg total) by mouth 2 (two) times daily.   fluticasone (FLONASE) 50 MCG/ACT nasal spray Place 1-2 sprays into both nostrils as needed for allergies or rhinitis.   irbesartan (AVAPRO) 75 MG tablet TAKE 1 TABLET DAILY   levothyroxine (SYNTHROID)  50 MCG tablet Take 1 tablet (50 mcg total) by mouth daily.   meclizine (ANTIVERT) 25 MG tablet TAKE ONE TABLET BY MOUTH THREE TIMES DAILY AS NEEDED FOR DIZZINESS (Patient taking differently: Take 25 mg by mouth 3 (three) times daily as needed for dizziness. )   methylPREDNISolone (MEDROL DOSEPAK) 4 MG TBPK tablet Use as directed on package.   pantoprazole (PROTONIX) 20 MG tablet TAKE (1) TABLET TWICE A DAY BEFORE MEALS.   No facility-administered encounter medications on file as of 02/10/2020.       Client will talk with LCSW in next 30 days to discuss anxiety /stress issues of concern for patinet at this time (pt-stated)      CARE PLAN ENTRY  Current Barriers:  Mental health issues of concern (anxiety/stress) in client with Chronic Diagnoses of HTN, GAD, GERD, and HLD Decreased energy Transportation challenges.  Clinical Social Work Clinical Goal(s):  LCSW will talk with client in next 30 days to discuss client managenet of anxiety or stress issues.   Interventions: Talked with client about CCM program support services Talked with Dekisha about [name] [facility] psychiatric referral for client  Talked with Tammela about anxiety or stress issues she faces.    Talked with Addisyn about transport needs of client Talked with client about sleeping challenges Talked with Thanvi about mucous issues (she said that she talked with [name] PA about this issue) Talked with client about  her decrease in energy Talked with client about relaxation techniques of choice (likes to play computer games,likes to do word puzzles) Talked with client about her upcoming medical appointments  Patient Self Care Activities:  Takes medications as prescribed Attends scheduled medical appointments  Patient SELF CARE DEFICITS: Transportation challenges Sleeping challenges Mental health issues  Initial goal documentation    Follow Up Plan:  LCSW to call client in next 4 weeks to talk with her about client  management of anxiety and stress issues faced by client  Norva Riffle.Tsuyako Jolley MSW, LCSW Licensed Clinical Social Worker [facility] Care Management [phone]  I have reviewed the CCM documentation and agree with the written assessment and plan of care.  [name], FNP

## 2020-02-10 NOTE — Patient Instructions (Addendum)
Licensed Clinical Social Worker Visit Information  Goals we discussed today:  Goals    .  Client will talk with LCSW in next 30 days to discuss anxiety /stress issues of concern for patinet at this time (pt-stated)      CARE PLAN ENTRY   Current Barriers:  Susan Contreras Kitchen Mental health issues of concern (anxiety/stress) in client with Chronic Diagnoses of HTN, GAD, GERD, and HLD . Decreased energy . Transportation challenges.  Clinical Social Work Clinical Goal(s):  Susan Contreras Kitchen LCSW will talk with client in next 30 days to discuss client managenet of anxiety or stress issues.   Interventions: . Talked with client about CCM program support services . Talked with client about weight issue of client  . Talked with Susan Contreras about Susan Dun PA psychiatric referral for client  . Talked with Susan Contreras about anxiety or stress issues she faces.   .  Talked with Susan Contreras about transport needs of client . Talked with Susan Contreras about her decreased appetite . Talked with client about sleeping challenges . Talked with Susan Contreras about mucous issues (she said that she talked with Susan Dun PA about this issue)  Talked with client about her decrease in energy  Talked with client about relaxation techniques of choice (likes to play computer games,likes to do word puzzles)  Talked with client about her upcoming medical appointments  Patient Self Care Activities:  Takes medications as prescribed Attends scheduled medical appointments  Patient SELF CARE DEFICITS: Transportation challenges Sleeping challenges Mental health issues   Initial goal documentation    Materials Provided: No  Follow Up Plan: LCSW to call client in next 4 weeks to talk with her about client management of anxiety and stress issues faced by client  The patient verbalized understanding of instructions provided today and declined a print copy of patient instruction materials.   Susan Contreras MSW, LCSW Licensed Clinical Social  Worker [facility] Care Management [phone]

## 2020-02-14 ENCOUNTER — Ambulatory Visit: Payer: Medicare HMO | Admitting: Physician Assistant

## 2020-02-15 ENCOUNTER — Other Ambulatory Visit: Payer: Self-pay | Admitting: Family

## 2020-02-15 NOTE — Chronic Care Management (AMB) (Addendum)
  Chronic Care Management   Follow-up Note  01/04/2020 Name: Susan Contreras MRN: [id number] DOB: Feb 27, 1941  Incoming call from patient to again discuss upcoming appointments, anxiety, and hypothyroidism management. Questions answered and reassurance provided. Reviewed upcoming appointments. See note and care plan from 01/03/2020.  Follow up plan: The care management team will reach out to the patient again over the next 30 days.   Chong Sicilian, BSN, RN-BC Embedded Chronic Care Manager [facility] [place] [facility] Direct Dial: [phone]    I have reviewed the CCM documentation and agree with the written assessment and plan of care.  [name], FNP

## 2020-02-16 ENCOUNTER — Other Ambulatory Visit: Payer: Self-pay | Admitting: Family

## 2020-02-20 ENCOUNTER — Other Ambulatory Visit: Payer: Self-pay | Admitting: Family

## 2020-02-20 DIAGNOSIS — I10 Essential (primary) hypertension: Secondary | ICD-10-CM

## 2020-03-01 ENCOUNTER — Ambulatory Visit: Payer: Medicare HMO | Admitting: Cardiology

## 2020-03-09 ENCOUNTER — Encounter: Payer: Self-pay | Admitting: Cardiology

## 2020-03-09 ENCOUNTER — Ambulatory Visit: Payer: Medicare HMO | Admitting: Cardiology

## 2020-03-09 VITALS — BP 144/74 | HR 75 | Ht 61.0 in | Wt 154.0 lb

## 2020-03-09 DIAGNOSIS — R0789 Other chest pain: Secondary | ICD-10-CM | POA: Diagnosis not present

## 2020-03-09 DIAGNOSIS — I10 Essential (primary) hypertension: Secondary | ICD-10-CM

## 2020-03-09 DIAGNOSIS — E782 Mixed hyperlipidemia: Secondary | ICD-10-CM

## 2020-03-09 NOTE — Progress Notes (Signed)
Clinical Summary Susan Contreras is a 79 y.o.female seen today for follow up of the following medical problems.   1. Chest pain - ER visit 06/2019 with chest pain - trop neg. CXR no acute process. EKG SR, LAFB. Possible anterolateral Qwaves in ER EKG but not seen in 06/17/19 EKG, I think related to lead placement - from ER notes symptoms improved with mylanta and viscous lidocaine.  CAD risk factors: HTN, HL  - history of GERD and PUD - remote cath 20 years she reports was ok.   - no recent chest pains.  - no SOB/DOE   2. HTN - has not taken meds yet today   3. Hyperlipidemia - 05/2019 TC 265 TG 280 LDL 164  - started on lipitor at that time by pcp  Past Medical History:  Diagnosis Date  . Anxiety   . Depression   . Diverticulosis   . Esophageal stricture   . Family hx of colorectal cancer   . GERD (gastroesophageal reflux disease)   . Helicobacter pylori (H. pylori) 1997   EGD  . Hemorrhoids   . Hiatal hernia   . Hx of adenomatous colonic polyps   . Hyperlipemia   . Hypertension   . PUD (peptic ulcer disease) 1997   EGD  . Thyroid disease   . Vertigo      Allergies  Allergen Reactions  . Biaxin [Clarithromycin]   . Cephalexin   . Ciprofloxacin   . Doxycycline     Headaches  . Flagyl [Metronidazole Hcl]   . Ketek [Telithromycin]   . Sulfa Antibiotics      Current Outpatient Medications  Medication Sig Dispense Refill  . ALPRAZolam (XANAX) 0.25 MG tablet TAKE 1 TABLET TWICE DAILY AS NEEDED FOR ANXIETY 60 tablet 5  . atorvastatin (LIPITOR) 20 MG tablet Take 1 tablet (20 mg total) by mouth daily. 90 tablet 3  . Chlorphen-PE-Acetaminophen (NOREL AD PO) Take 1 tablet by mouth as needed.     . fluticasone (FLONASE) 50 MCG/ACT nasal spray USE 1 SPRAY IN EACH NOSTRIL ONCE DAILY. 16 g 0  . irbesartan (AVAPRO) 75 MG tablet TAKE 1 TABLET DAILY 90 tablet 0  . levothyroxine (SYNTHROID) 50 MCG tablet Take 1 tablet (50 mcg total) by mouth daily. 90 tablet 3   . meclizine (ANTIVERT) 25 MG tablet TAKE ONE TABLET BY MOUTH THREE TIMES DAILY AS NEEDED FOR DIZZINESS (Patient taking differently: Take 25 mg by mouth 3 (three) times daily as needed for dizziness. ) 30 tablet 0  . pantoprazole (PROTONIX) 20 MG tablet TAKE (1) TABLET TWICE A DAY BEFORE MEALS. 60 tablet 5   No current facility-administered medications for this visit.     Past Surgical History:  Procedure Laterality Date  . ABDOMINAL HYSTERECTOMY    . BILATERAL OOPHORECTOMY    . CHOLECYSTECTOMY       Allergies  Allergen Reactions  . Biaxin [Clarithromycin]   . Cephalexin   . Ciprofloxacin   . Doxycycline     Headaches  . Flagyl [Metronidazole Hcl]   . Ketek [Telithromycin]   . Sulfa Antibiotics       Family History  Problem Relation Age of Onset  . Colon cancer Sister   . Stroke Mother   . Heart disease Mother   . Parkinson's disease Father   . Ovarian cancer Sister   . Esophageal cancer Neg Hx   . Stomach cancer Neg Hx   . Kidney disease Neg Hx   . Liver  disease Neg Hx   . Diabetes Neg Hx      Social History Susan Contreras reports that she has never smoked. She has never used smokeless tobacco. Susan Contreras reports no history of alcohol use.   Review of Systems CONSTITUTIONAL: No weight loss, fever, chills, weakness or fatigue.  HEENT: Eyes: No visual loss, blurred vision, double vision or yellow sclerae.No hearing loss, sneezing, congestion, runny nose or sore throat.  SKIN: No rash or itching.  CARDIOVASCULAR: per hpi RESPIRATORY: No shortness of breath, cough or sputum.  GASTROINTESTINAL: No anorexia, nausea, vomiting or diarrhea. No abdominal pain or blood.  GENITOURINARY: No burning on urination, no polyuria NEUROLOGICAL: No headache, dizziness, syncope, paralysis, ataxia, numbness or tingling in the extremities. No change in bowel or bladder control.  MUSCULOSKELETAL: No muscle, back pain, joint pain or stiffness.  LYMPHATICS: No enlarged nodes. No history  of splenectomy.  PSYCHIATRIC: No history of depression or anxiety.  ENDOCRINOLOGIC: No reports of sweating, cold or heat intolerance. No polyuria or polydipsia.  Marland Kitchen   Physical Examination Vitals:   03/09/20 1058  BP: (!) 144/74  Pulse: 75  SpO2: 95%   Filed Weights   03/09/20 1058  Weight: 154 lb (69.9 kg)    Gen: resting comfortably, no acute distress HEENT: no scleral icterus, pupils equal round and reactive, no palptable cervical adenopathy,  CV: RRR, no m/r/g, no jvd Resp: Clear to auscultation bilaterally GI: abdomen is soft, non-tender, non-distended, normal bowel sounds, no hepatosplenomegaly MSK: extremities are warm, no edema.  Skin: warm, no rash Neuro:  no focal deficits Psych: appropriate affect      Assessment and Plan   1. Epigastric pain/Chest pain - atypical epigastric pain not consistent with cardiac chest pain.  - no recurrent symptoms - no plans for further cardiac testing at this time  2. HTN - elevated today but has not taken meds yet. From prior visits in epic bp's have been controlled, continue current meds  3. Hyperlipidemia - recently started on statin, continue. Labs per pcp   F/u as needed     Arnoldo Lenis, M.D.

## 2020-03-09 NOTE — Patient Instructions (Signed)
Medication Instructions:   Your physician recommends that you continue on your current medications as directed. Please refer to the Current Medication list given to you today.  Labwork:  NONE  Testing/Procedures:  NONE  Follow-Up:  Your physician recommends that you schedule a follow-up appointment in: as needed.   Any Other Special Instructions Will Be Listed Below (If Applicable).  If you need a refill on your cardiac medications before your next appointment, please call your pharmacy. 

## 2020-03-19 ENCOUNTER — Ambulatory Visit (INDEPENDENT_AMBULATORY_CARE_PROVIDER_SITE_OTHER): Payer: Medicare HMO | Admitting: Licensed Clinical Social Worker

## 2020-03-19 DIAGNOSIS — I10 Essential (primary) hypertension: Secondary | ICD-10-CM | POA: Diagnosis not present

## 2020-03-19 DIAGNOSIS — K219 Gastro-esophageal reflux disease without esophagitis: Secondary | ICD-10-CM

## 2020-03-19 DIAGNOSIS — E785 Hyperlipidemia, unspecified: Secondary | ICD-10-CM

## 2020-03-19 DIAGNOSIS — F411 Generalized anxiety disorder: Secondary | ICD-10-CM

## 2020-03-19 NOTE — Chronic Care Management (AMB) (Addendum)
Chronic Care Management    Clinical Social Work Follow Up Note  03/19/2020 Name: Susan Contreras MRN: [id number] DOB: [birth year]  Susan Contreras is a 79 y.o. year old female who is a primary care patient of [name], FNP. The CCM team was consulted for assistance with [name] .   Review of patient status, including review of consultants reports, other relevant assessments, and collaboration with appropriate care team members and the patient's provider was performed as part of comprehensive patient evaluation and provision of chronic care management services.    SDOH (Social Determinants of Health) assessments performed: No; risk for tobacco use; risk for depression; risk for stress; risk for transport needs    Chronic Care Management from 11/08/2019 in [facility]  PHQ-9 Total Score 8         GAD 7 : Generalized Anxiety Score 01/20/2020 11/08/2019 11/02/2019  Nervous, Anxious, on Edge 0 1 1  Control/stop worrying 0 1 0  Worry too much - different things 0 1 0  Trouble relaxing 0 1 0  Restless 0 0 0  Easily annoyed or irritable 0 1 0  Afraid - awful might happen 0 0 0  Total GAD 7 Score 0 5 1  Anxiety Difficulty Not difficult at all Somewhat difficult -    Outpatient Encounter Medications as of 03/19/2020  Medication Sig   ALPRAZolam (XANAX) 0.25 MG tablet TAKE 1 TABLET TWICE DAILY AS NEEDED FOR ANXIETY   atorvastatin (LIPITOR) 20 MG tablet Take 1 tablet (20 mg total) by mouth daily.   Chlorphen-PE-Acetaminophen (NOREL AD PO) Take 1 tablet by mouth as needed.    fluticasone (FLONASE) 50 MCG/ACT nasal spray USE 1 SPRAY IN EACH NOSTRIL ONCE DAILY.   irbesartan (AVAPRO) 75 MG tablet TAKE 1 TABLET DAILY   levothyroxine (SYNTHROID) 50 MCG tablet Take 1 tablet (50 mcg total) by mouth daily.   meclizine (ANTIVERT) 25 MG tablet TAKE ONE TABLET BY MOUTH THREE TIMES DAILY AS NEEDED FOR DIZZINESS (Patient taking differently: Take 25 mg by mouth 3 (three)  times daily as needed for dizziness. )   pantoprazole (PROTONIX) 20 MG tablet TAKE (1) TABLET TWICE A DAY BEFORE MEALS.   No facility-administered encounter medications on file as of 03/19/2020.     Goals            Client will talk with LCSW in next 30 days to discuss anxiety /stress issues of concern for patinet at this time (pt-stated)      CARE PLAN ENTRY   Current Barriers:  Mental health issues of concern (anxiety/stress) in client with Chronic Diagnoses of HTN, GAD, GERD, and HLD Decreased energy Transportation challenges.  Clinical Social Work Clinical Goal(s):  LCSW will talk with client in next 30 days to discuss client managenet of anxiety or stress issues.   Interventions: Talked with client about CCM program support services Talked with client about weight issue of client (she said she lost 23 pounds since Octoboer of 2020) Talked with Desarie about [name] psychiatric referral for client  Talked with Sneha about anxiety or stress issues she faces.    Talked with Irlanda about transport needs of client Talked with Keeara about her decreased appetite Talked with client about sleeping challenges Talked with client about her recent appointment with cardiologist Talked with client about her current social work needs Talked with client about energy level (decreased)  Patient Self Care Activities:  Takes medications as prescribed Attends scheduled medical appointments  Patient  SELF CARE DEFICITS: Chartered loss adjuster Mental health issues    Initial goal documentation    Follow Up Plan: LCSW will call client in next 4 weeks to discuss client management of anxiety or stress issues  Norva Riffle.Ondrea Dow MSW, LCSW Licensed Clinical Social Worker [facility] Care Management [phone]  I have reviewed the CCM documentation and agree with the written assessment and plan of care.  [name], FNP

## 2020-03-19 NOTE — Patient Instructions (Addendum)
Licensed Clinical Social Worker Visit Information  Goals we discussed today:     .  Client will talk with LCSW in next 30 days to discuss anxiety /stress issues of concern for patinet at this time (pt-stated)       CARE PLAN ENTRY   Current Barriers:   Mental health issues of concern (anxiety/stress) in client with Chronic Diagnoses of HTN, GAD, GERD, and HLD  Decreased energy  Transportation challenges.  Clinical Social Work Clinical Goal(s):   LCSW will talk with client in next 30 days to discuss client managenet of anxiety or stress issues.   Interventions:  Talked with client about CCM program support services  Talked with client about weight issue of client (she said she lost 23 pounds since Octoboer of 2020)  Talked with Donnetta about [name] psychiatric referral for client   Talked with Cherisa about anxiety or stress issues she faces.     Talked with Milla about transport needs of client  Talked with Jolea about her decreased appetite  Talked with client about sleeping challenges  Talked with client about her recent appointment with cardiologist  Talked with client about her current social work needs  Talked with client about energy level (decreased)  Patient Self Care Activities:  Takes medications as prescribed Attends scheduled medical appointments  Patient SELF CARE DEFICITS: Transportation challenges Sleeping challenges Mental health issues  Initial goal documentation    Follow Up Plan: LCSW will call client in next 4 weeks to discuss client management of anxiety or stress issues  Materials Provided: No  The patient verbalized understanding of instructions provided today and declined a print copy of patient instruction materials.   Norva Riffle.Tish Begin MSW, LCSW Licensed Clinical Social Worker [facility] Care Management [phone]

## 2020-03-20 ENCOUNTER — Ambulatory Visit: Payer: Medicare HMO | Admitting: Physician Assistant

## 2020-03-27 ENCOUNTER — Ambulatory Visit (INDEPENDENT_AMBULATORY_CARE_PROVIDER_SITE_OTHER): Payer: Medicare HMO | Admitting: *Deleted

## 2020-03-27 ENCOUNTER — Other Ambulatory Visit: Payer: Self-pay | Admitting: Family

## 2020-03-27 ENCOUNTER — Telehealth: Payer: Self-pay | Admitting: *Deleted

## 2020-03-27 DIAGNOSIS — K2100 Gastro-esophageal reflux disease with esophagitis, without bleeding: Secondary | ICD-10-CM

## 2020-03-27 DIAGNOSIS — K219 Gastro-esophageal reflux disease without esophagitis: Secondary | ICD-10-CM

## 2020-03-27 DIAGNOSIS — E039 Hypothyroidism, unspecified: Secondary | ICD-10-CM

## 2020-03-27 DIAGNOSIS — F411 Generalized anxiety disorder: Secondary | ICD-10-CM

## 2020-03-27 DIAGNOSIS — I10 Essential (primary) hypertension: Secondary | ICD-10-CM

## 2020-03-27 NOTE — Patient Instructions (Signed)
Visit Information  Goals Addressed              This Visit's Progress     Patient Stated   .  "I need help managing my thyroid" (pt-stated)        CARE PLAN ENTRY (see longitudinal plan of care for additional care plan information)  Current Barriers:  . Chronic Disease Management support and education needs related to hypothyroidism  Nurse Case Manager Clinical Goal(s):  Marland Kitchen Over the next 30 days, patient will work with PCP office to schedule visit with endocrinologist . Over the next 90 days, patient will keep all scheduled medical appointments  Interventions:  . Inter-disciplinary care team collaboration (see longitudinal plan of care) . Chart reviewed including recent office notes and lab results o Normal TSH noted on 01/12/20 . Talked with patient by telephone o She is concerned about continued fatigue and hair loss despite normal TSH and would like an endocrinology referral to Dr Dorris Fetch in [place] o Discussed other possible reasons for hair loss . Telephone message sent to PCP advising that patient is requesting an endocrinology referral . Provided patient with Dr Liliane Channel telephone number, [phone] . Previously provided with RN Care Manager telephone number and encouraged to reach out as needed . Reviewed upcoming appointments  Patient Self Care Activities:  . Performs ADL's independently . Performs IADL's independently  Please see past updates related to this goal by clicking on the "Past Updates" button in the selected goal        Other   .  "I need help managing GERD and abominal pain"        CARE PLAN ENTRY (see longitudinal plan of care for additional care plan information)  Current Barriers:  . Chronic Disease Management support and education needs related to GERD  in a patient with hyperlipidemia, hypothyroidism, and hypertension  Nurse Case Manager Clinical Goal(s):  Marland Kitchen Over the next 30 days, patient will work with PCP office to schedule appt with GI in  [place] . Over the next 90 days, patient will keep all scheduled medical appointments  Interventions:  . Inter-disciplinary care team collaboration (see longitudinal plan of care) . Chart reviewed including recent office notes, lab results, imaging reports, and referral notes . Talked with patient by telephone o Discussed referral to GI back in 10/2019. Patient declined at that time because she was anxious about possible tests . Discussed GERD symptoms and upper recurrent upper GI pain . Discussed GERD management o Protonix o Small meals o Inclining bed . Encouraged patient to see GI, especially since her symptoms have not improved over the past 5 months o Discussed that tests and treatments are optional and that he will discuss them with her in detail o She would like to talk with them and requests a referral . Telephone message sent to PCP requesting GI referral to Dr Laural Golden . Encouraged patient to reach out to PCP with any new or worsening symptoms . Provided with RNCM contact number and encouraged to reach out as needed  Patient Self Care Activities:  . Performs ADL's independently . Performs IADL's independently   Please see past updates related to this goal by clicking on the "Past Updates" button in the selected goal         Patient verbalizes understanding of instructions provided today.   Follow-up Plan Telephone follow up appointment with care management team member scheduled for:04/16/20 with RN Care Manager  Chong Sicilian, BSN, RN-BC [place]  [facility] / [facility] Care Management Direct Dial: [phone]

## 2020-03-27 NOTE — Progress Notes (Signed)
Referral placed.

## 2020-03-27 NOTE — Telephone Encounter (Signed)
03/27/2020  Incoming call from patient today. She is requesting two referrals.   1.Patient requesting referral to Dr Dorris Fetch in [place] for thyroid management. Continues to complain of fatigue and hair loss despite now normal thyroid levels.  Reason for Referral: hypothyroidism Referral discussed with patient: yes Best contact number of patient for referral team: [phone]   Has patient been seen by a specialist for this issue before: No If so, who (practice/provider):  Does the patient wish to return:  Patient provider preference for referral: Dr Dorris Fetch Patient location preference for referral: [place]  2. Patient would also like referral to Dr Laural Golden. She was referred in March but declined appt at that time. After further consideration, she would like to proceed with referral.   Reason for Referral: GERD, recurrent upper abd pain Referral discussed with patient: yes Best contact number of patient for referral team: [phone]   Has patient been seen by a specialist for this issue before: yes  If so, who (practice/provider): Lady Gary, Financial controller Does the patient wish to return: no Patient provider preference for referral: Dr Laural Golden Patient location preference for referral: [place]  Patient prefers [facility] for appointments as she does not drive in [place].  Forwarding to PCP for review and action.   Chong Sicilian, BSN, RN-BC Embedded Chronic Care Manager [facility] / [place] Management Direct Dial: [phone]

## 2020-03-27 NOTE — Chronic Care Management (AMB) (Addendum)
Chronic Care Management   Follow Up Note   03/27/2020 Name: Susan Contreras MRN: [id number] DOB: June 09, 1941  Referred by: [name], FNP Reason for referral : Chronic Care Management (incoming call)   Susan Contreras is a 79 y.o. year old female who is a primary care patient of [name], FNP. The CCM team was consulted for assistance with chronic disease management and care coordination needs.    Review of patient status, including review of consultants reports, relevant laboratory and other test results, and collaboration with appropriate care team members and the patient's provider was performed as part of comprehensive patient evaluation and provision of chronic care management services.    SDOH (Social Determinants of Health) assessments performed: No See Care Plan activities for detailed interventions related to [facility])      Outpatient Encounter Medications as of 03/27/2020  Medication Sig   ALPRAZolam (XANAX) 0.25 MG tablet TAKE 1 TABLET TWICE DAILY AS NEEDED FOR ANXIETY   atorvastatin (LIPITOR) 20 MG tablet Take 1 tablet (20 mg total) by mouth daily.   Chlorphen-PE-Acetaminophen (NOREL AD PO) Take 1 tablet by mouth as needed.    fluticasone (FLONASE) 50 MCG/ACT nasal spray USE 1 SPRAY IN EACH NOSTRIL ONCE DAILY.   irbesartan (AVAPRO) 75 MG tablet TAKE 1 TABLET DAILY   levothyroxine (SYNTHROID) 50 MCG tablet Take 1 tablet (50 mcg total) by mouth daily.   meclizine (ANTIVERT) 25 MG tablet TAKE ONE TABLET BY MOUTH THREE TIMES DAILY AS NEEDED FOR DIZZINESS (Patient taking differently: Take 25 mg by mouth 3 (three) times daily as needed for dizziness. )   pantoprazole (PROTONIX) 20 MG tablet TAKE (1) TABLET TWICE A DAY BEFORE MEALS.   No facility-administered encounter medications on file as of 03/27/2020.    Lab Results  Component Value Date   TSH 2.320 01/12/2020   T4TOTAL 8.1 02/03/2017     RN Care Plan      "I need help managing my thyroid" (pt-stated)        CARE  PLAN ENTRY (see longitudinal plan of care for additional care plan information)  Current Barriers:  Chronic Disease Management support and education needs related to hypothyroidism  Nurse Case Manager Clinical Goal(s):  Over the next 30 days, patient will work with PCP office to schedule visit with endocrinologist Over the next 90 days, patient will keep all scheduled medical appointments  Interventions:  Inter-disciplinary care team collaboration (see longitudinal plan of care) Chart reviewed including recent office notes and lab results Normal TSH noted on 01/12/20 Talked with patient by telephone She is concerned about continued fatigue and hair loss despite normal TSH and would like an endocrinology referral to Dr Dorris Fetch in [place] Discussed other possible reasons for hair loss Telephone message sent to PCP advising that patient is requesting an endocrinology referral Provided patient with Dr Liliane Channel telephone number, [phone] Previously provided with [place] telephone number and encouraged to reach out as needed Reviewed upcoming appointments  Patient Self Care Activities:  Performs ADL's independently Performs IADL's independently  Please see past updates related to this goal by clicking on the "Past Updates" button in the selected goal        "I need help managing GERD and abominal pain"        CARE PLAN ENTRY (see longitudinal plan of care for additional care plan information)  Current Barriers:  Chronic Disease Management support and education needs related to GERD in a patient with hypothyroidism, hyperlipidemia, and hypertension  Nurse Case Manager Clinical Goal(s):  Over the next 30 days, patient will work with PCP office to schedule appt with GI in [place] Over the next 90 days, patient will keep all scheduled medical appointments  Interventions:  Inter-disciplinary care team collaboration (see longitudinal plan of care) Chart reviewed including  recent office notes, lab results, imaging reports, and referral notes Talked with patient by telephone Discussed referral to GI back in 10/2019. Patient declined at that time because she was anxious about possible tests Discussed GERD symptoms and upper recurrent upper GI pain Discussed GERD management Protonix Small meals Inclining bed Encouraged patient to see GI, especially since her symptoms have not improved over the past 5 months Discussed that tests and treatments are optional and that he will discuss them with her in detail She would like to talk with them and requests a referral Telephone message sent to PCP requesting GI referral to Dr Laural Golden Encouraged patient to reach out to PCP with any new or worsening symptoms Provided with RNCM contact number and encouraged to reach out as needed  Patient Self Care Activities:  Performs ADL's independently Performs IADL's independently   Please see past updates related to this goal by clicking on the "Past Updates" button in the selected goal           Plan:   Telephone follow up appointment with care management team member scheduled for: RN Care Manager 04/16/20   Chong Sicilian, BSN, RN-BC [facility] [place] Management Direct Dial: [phone]   I have reviewed the CCM documentation and agree with the written assessment and plan of care.  [name], FNP

## 2020-03-29 ENCOUNTER — Ambulatory Visit: Payer: Medicare HMO | Admitting: *Deleted

## 2020-03-29 DIAGNOSIS — K2100 Gastro-esophageal reflux disease with esophagitis, without bleeding: Secondary | ICD-10-CM

## 2020-03-29 DIAGNOSIS — E039 Hypothyroidism, unspecified: Secondary | ICD-10-CM

## 2020-03-29 DIAGNOSIS — F411 Generalized anxiety disorder: Secondary | ICD-10-CM

## 2020-03-29 DIAGNOSIS — I10 Essential (primary) hypertension: Secondary | ICD-10-CM

## 2020-03-30 NOTE — Patient Instructions (Signed)
Visit Information  Goals Addressed              This Visit's Progress     Patient Stated   .  "I need help managing my thyroid" (pt-stated)        CARE PLAN ENTRY (see longitudinal plan of care for additional care plan information)  Current Barriers:  . Chronic Disease Management support and education needs related to hypothyroidism  Nurse Case Manager Clinical Goal(s):  Marland Kitchen Over the next 30 days, patient will have visit with endocrinologist  . Over the next 90 days, patient will keep all scheduled medical appointments  Interventions:  . Inter-disciplinary care team collaboration (see longitudinal plan of care) . Chart reviewed including referral notes . Talked with patient by telephone . Previously discussed thyroid symptoms and hair loss . Reviewed upcoming appointments with patient including Dr Dorris Fetch on 04/27/20 . Encouraged patient to reach out to [facility] team as needed  Patient Self Care Activities:  . Performs ADL's independently . Performs IADL's independently  Please see past updates related to this goal by clicking on the "Past Updates" button in the selected goal        Other   .  "I need help managing GERD and abominal pain"        CARE PLAN ENTRY (see longitudinal plan of care for additional care plan information)  Current Barriers:  . Chronic Disease Management support and education needs related to GERD  in a patient with hyperlipidemia, hypothyroidism, and hypertension  Nurse Case Manager Clinical Goal(s):  Marland Kitchen Over the next 30 days, patient will have an appointment with GI specialist . Over the next 90 days, patient will keep all scheduled medical appointments  Interventions:  . Inter-disciplinary care team collaboration (see longitudinal plan of care) . Chart reviewed including referral note . Previously talked with patient re: GI symptoms . Discussed referral to GI and upcoming appointment on 04/26/20 . Verified GI appointment location for  patient . Encouraged patient to reach out to [facility] team as needed  Patient Self Care Activities:  . Performs ADL's independently . Performs IADL's independently   Please see past updates related to this goal by clicking on the "Past Updates" button in the selected goal         Patient verbalizes understanding of instructions provided today.   Follow-up Plan The care management team will reach out to the patient again over the next 30 days.   Chong Sicilian, BSN, RN-BC Embedded Chronic Care Manager [facility] / [place] Management Direct Dial: [phone]

## 2020-03-30 NOTE — Chronic Care Management (AMB) (Signed)
Chronic Care Management   Follow Up Note   03/29/2020 Name: Susan Contreras MRN: [id number] DOB: 1941-08-04  Referred by: [name], FNP Reason for referral : Chronic Care Management   Susan Contreras is a 79 y.o. year old female who is a primary care patient of [name], FNP. The CCM team was consulted for assistance with chronic disease management and care coordination needs.    Review of patient status, including review of consultants reports, relevant laboratory and other test results, and collaboration with appropriate care team members and the patient's provider was performed as part of comprehensive patient evaluation and provision of chronic care management services.    SDOH (Social Determinants of Health) assessments performed: No See Care Plan activities for detailed interventions related to [facility])     Outpatient Encounter Medications as of 03/29/2020  Medication Sig  . ALPRAZolam (XANAX) 0.25 MG tablet TAKE 1 TABLET TWICE DAILY AS NEEDED FOR ANXIETY  . atorvastatin (LIPITOR) 20 MG tablet Take 1 tablet (20 mg total) by mouth daily.  . Chlorphen-PE-Acetaminophen (NOREL AD PO) Take 1 tablet by mouth as needed.   . fluticasone (FLONASE) 50 MCG/ACT nasal spray USE 1 SPRAY IN EACH NOSTRIL ONCE DAILY.  Marland Kitchen irbesartan (AVAPRO) 75 MG tablet TAKE 1 TABLET DAILY  . levothyroxine (SYNTHROID) 50 MCG tablet Take 1 tablet (50 mcg total) by mouth daily.  . meclizine (ANTIVERT) 25 MG tablet TAKE ONE TABLET BY MOUTH THREE TIMES DAILY AS NEEDED FOR DIZZINESS (Patient taking differently: Take 25 mg by mouth 3 (three) times daily as needed for dizziness. )  . pantoprazole (PROTONIX) 20 MG tablet TAKE (1) TABLET TWICE A DAY BEFORE MEALS.   No facility-administered encounter medications on file as of 03/29/2020.      RN Care Plan   .  "I need help managing my thyroid" (pt-stated)        CARE PLAN ENTRY (see longitudinal plan of care for additional care plan information)  Current Barriers:   . Chronic Disease Management support and education needs related to hypothyroidism  Nurse Case Manager Clinical Goal(s):  Marland Kitchen Over the next 30 days, patient will have visit with endocrinologist  . Over the next 90 days, patient will keep all scheduled medical appointments  Interventions:  . Inter-disciplinary care team collaboration (see longitudinal plan of care) . Chart reviewed including referral notes . Talked with patient by telephone . Previously discussed thyroid symptoms and hair loss . Reviewed upcoming appointments with patient including Dr Dorris Fetch on 04/27/20 . Encouraged patient to reach out to [facility] team as needed  Patient Self Care Activities:  . Performs ADL's independently . Performs IADL's independently  Please see past updates related to this goal by clicking on the "Past Updates" button in the selected goal      .  "I need help managing GERD and abominal pain"        CARE PLAN ENTRY (see longitudinal plan of care for additional care plan information)  Current Barriers:  . Chronic Disease Management support and education needs related to GERD  in a patient with hyperlipidemia, hypothyroidism, and hypertension  Nurse Case Manager Clinical Goal(s):  Marland Kitchen Over the next 30 days, patient will have an appointment with GI specialist . Over the next 90 days, patient will keep all scheduled medical appointments  Interventions:  . Inter-disciplinary care team collaboration (see longitudinal plan of care) . Chart reviewed including referral note . Previously talked with patient re: GI symptoms . Discussed referral to GI  and upcoming appointment on 04/26/20 . Verified GI appointment location for patient . Encouraged patient to reach out to [facility] team as needed  Patient Self Care Activities:  . Performs ADL's independently . Performs IADL's independently   Please see past updates related to this goal by clicking on the "Past Updates" button in the selected goal           Plan:   The care management team will reach out to the patient again over the next 30 days.    Chong Sicilian, BSN, RN-BC Embedded Chronic Care Manager [facility] / [place] Management Direct Dial: [phone]

## 2020-04-13 ENCOUNTER — Other Ambulatory Visit: Payer: Self-pay

## 2020-04-13 ENCOUNTER — Ambulatory Visit (INDEPENDENT_AMBULATORY_CARE_PROVIDER_SITE_OTHER): Payer: Medicare HMO | Admitting: Family

## 2020-04-13 ENCOUNTER — Encounter: Payer: Self-pay | Admitting: Family

## 2020-04-13 VITALS — BP 114/72 | HR 76 | Temp 97.6°F | Ht 61.0 in | Wt 155.2 lb

## 2020-04-13 DIAGNOSIS — Z79899 Other long term (current) drug therapy: Secondary | ICD-10-CM

## 2020-04-13 DIAGNOSIS — E559 Vitamin D deficiency, unspecified: Secondary | ICD-10-CM | POA: Diagnosis not present

## 2020-04-13 DIAGNOSIS — E669 Obesity, unspecified: Secondary | ICD-10-CM | POA: Diagnosis not present

## 2020-04-13 DIAGNOSIS — F1320 Sedative, hypnotic or anxiolytic dependence, uncomplicated: Secondary | ICD-10-CM

## 2020-04-13 DIAGNOSIS — E039 Hypothyroidism, unspecified: Secondary | ICD-10-CM | POA: Diagnosis not present

## 2020-04-13 DIAGNOSIS — Z1159 Encounter for screening for other viral diseases: Secondary | ICD-10-CM

## 2020-04-13 DIAGNOSIS — E785 Hyperlipidemia, unspecified: Secondary | ICD-10-CM

## 2020-04-13 DIAGNOSIS — F411 Generalized anxiety disorder: Secondary | ICD-10-CM | POA: Diagnosis not present

## 2020-04-13 DIAGNOSIS — K2100 Gastro-esophageal reflux disease with esophagitis, without bleeding: Secondary | ICD-10-CM | POA: Diagnosis not present

## 2020-04-13 DIAGNOSIS — I159 Secondary hypertension, unspecified: Secondary | ICD-10-CM

## 2020-04-13 DIAGNOSIS — I959 Hypotension, unspecified: Secondary | ICD-10-CM

## 2020-04-13 MED ORDER — ALPRAZOLAM 0.25 MG PO TABS: ORAL_TABLET | ORAL | 5 refills | Status: DC

## 2020-04-13 NOTE — Patient Instructions (Signed)

## 2020-04-13 NOTE — Progress Notes (Signed)
Subjective:    Patient ID: Susan Contreras, female    DOB: [birth year], 79 y.o.   MRN: [id number]  Chief Complaint  Patient presents with  . Medical Management of Chronic Issues  . Hypertension  . Hypothyroidism   Pt presents to the office today for chronic follow up. Susan Contreras is followed by GI every 6 months.  Susan Contreras has an appt with an Endocrinologists in two weeks for the first time. Susan Contreras feels weak and tired and feels like this is related to her thyroid.   Gastroesophageal Reflux Susan Contreras complains of belching and heartburn. This is a chronic problem. The current episode started more than 1 year ago. The problem occurs occasionally. Risk factors include obesity. Susan Contreras has tried a PPI for the symptoms. The treatment provided moderate relief.  Hyperlipidemia This is a chronic problem. The current episode started more than 1 year ago. The problem is controlled. Recent lipid tests were reviewed and are normal. Exacerbating diseases include hypothyroidism. Pertinent negatives include no shortness of breath. Current antihyperlipidemic treatment includes statins. The current treatment provides moderate improvement of lipids. Risk factors for coronary artery disease include dyslipidemia, post-menopausal and a sedentary lifestyle.  Anxiety Presents for follow-up visit. Symptoms include depressed mood, excessive worry, irritability, nervous/anxious behavior and restlessness. Patient reports no shortness of breath. Symptoms occur occasionally.    Hypertension This is a chronic problem. The current episode started more than 1 year ago. The problem has been resolved since onset. The problem is controlled. Associated symptoms include anxiety. Pertinent negatives include no peripheral edema or shortness of breath. The current treatment provides moderate improvement.      Review of Systems  Constitutional: Positive for irritability.  Respiratory: Negative for shortness of breath.   Gastrointestinal: Positive for  heartburn.  Psychiatric/Behavioral: The patient is nervous/anxious.   All other systems reviewed and are negative.      Objective:   Physical Exam Vitals reviewed.  Constitutional:      General: Susan Contreras is not in acute distress.    Appearance: Susan Contreras is well-developed. Susan Contreras is obese.  HENT:     Head: Normocephalic and atraumatic.     Right Ear: Tympanic membrane normal.     Left Ear: Tympanic membrane normal.  Eyes:     Pupils: Pupils are equal, round, and reactive to light.  Neck:     Thyroid: No thyromegaly.  Cardiovascular:     Rate and Rhythm: Normal rate and regular rhythm.     Heart sounds: Normal heart sounds. No murmur heard.   Pulmonary:     Effort: Pulmonary effort is normal. No respiratory distress.     Breath sounds: Normal breath sounds. No wheezing.  Abdominal:     General: Bowel sounds are normal. There is no distension.     Palpations: Abdomen is soft.     Tenderness: There is no abdominal tenderness.  Musculoskeletal:        General: No tenderness. Normal range of motion.     Cervical back: Normal range of motion and neck supple.  Skin:    General: Skin is warm and dry.  Neurological:     Mental Status: Susan Contreras is alert and oriented to person, place, and time.     Cranial Nerves: No cranial nerve deficit.     Deep Tendon Reflexes: Reflexes are normal and symmetric.  Psychiatric:        Behavior: Behavior normal.        Thought Content: Thought content normal.  Judgment: Judgment normal.       BP 94/64   Pulse 83   Temp 97.6 F (36.4 C) (Temporal)   Ht 5' 1"  (1.549 m)   Wt 155 lb 3.2 oz (70.4 kg)   SpO2 95%   BMI 29.32 kg/m      Assessment & Plan:  Ovida Delagarza comes in today with chief complaint of Medical Management of Chronic Issues, Hypertension, and Hypothyroidism   Diagnosis and orders addressed:  1. Gastroesophageal reflux disease with esophagitis, unspecified whether hemorrhage - CMP14+EGFR - CBC with Differential/Platelet  2.  Hypothyroidism, unspecified type - CMP14+EGFR - CBC with Differential/Platelet - Thyroid Panel With TSH  3. GAD (generalized anxiety disorder) - CMP14+EGFR - CBC with Differential/Platelet - ALPRAZolam (XANAX) 0.25 MG tablet; TAKE 1 TABLET TWICE DAILY AS NEEDED FOR ANXIETY  Dispense: 60 tablet; Refill: 5  4. Controlled substance agreement signed - CMP14+EGFR - CBC with Differential/Platelet - ALPRAZolam (XANAX) 0.25 MG tablet; TAKE 1 TABLET TWICE DAILY AS NEEDED FOR ANXIETY  Dispense: 60 tablet; Refill: 5 - ToxASSURE Select 13 (MW), Urine  5. Benzodiazepine dependence (HCC) - CMP14+EGFR - CBC with Differential/Platelet - ALPRAZolam (XANAX) 0.25 MG tablet; TAKE 1 TABLET TWICE DAILY AS NEEDED FOR ANXIETY  Dispense: 60 tablet; Refill: 5 - ToxASSURE Select 13 (MW), Urine  6. Obesity (BMI 30.0-34.9) - CMP14+EGFR - CBC with Differential/Platelet  7. Vitamin D deficiency - CMP14+EGFR - CBC with Differential/Platelet  8. Hyperlipidemia, unspecified hyperlipidemia type - CMP14+EGFR - CBC with Differential/Platelet - Lipid panel  9. Need for hepatitis C screening test - CMP14+EGFR - CBC with Differential/Platelet - Hepatitis C antibody  10. Secondary hypertension  11. Hypotension, unspecified hypotension type Will hold irbesartan    Labs pending Patient reviewed in  controlled database, no flags noted. Contract and drug screen are up to date.  Health Maintenance reviewed Diet and exercise encouraged  Follow up plan: 2 weeks to recheck BP as we are holding medication    [name], FNP

## 2020-04-14 LAB — CBC WITH DIFFERENTIAL/PLATELET
Basophils Absolute: 0.1 10*3/uL (ref 0.0–0.2)
Basos: 1 %
EOS (ABSOLUTE): 0.3 10*3/uL (ref 0.0–0.4)
Eos: 3 %
Hematocrit: 42 % (ref 34.0–46.6)
Hemoglobin: 14 g/dL (ref 11.1–15.9)
Immature Grans (Abs): 0 10*3/uL (ref 0.0–0.1)
Immature Granulocytes: 0 %
Lymphocytes Absolute: 3.2 10*3/uL — ABNORMAL HIGH (ref 0.7–3.1)
Lymphs: 34 %
MCH: 30.2 pg (ref 26.6–33.0)
MCHC: 33.3 g/dL (ref 31.5–35.7)
MCV: 91 fL (ref 79–97)
Monocytes Absolute: 0.7 10*3/uL (ref 0.1–0.9)
Monocytes: 8 %
Neutrophils Absolute: 5.2 10*3/uL (ref 1.4–7.0)
Neutrophils: 54 %
Platelets: 331 10*3/uL (ref 150–450)
RBC: 4.63 x10E6/uL (ref 3.77–5.28)
RDW: 13 % (ref 11.7–15.4)
WBC: 9.5 10*3/uL (ref 3.4–10.8)

## 2020-04-14 LAB — LIPID PANEL
Chol/HDL Ratio: 4.4 ratio (ref 0.0–4.4)
Cholesterol, Total: 225 mg/dL — ABNORMAL HIGH (ref 100–199)
HDL: 51 mg/dL (ref >39)
LDL Chol Calc (NIH): 121 mg/dL — ABNORMAL HIGH (ref 0–99)
Triglycerides: 302 mg/dL — ABNORMAL HIGH (ref 0–149)
VLDL Cholesterol Cal: 53 mg/dL — ABNORMAL HIGH (ref 5–40)

## 2020-04-14 LAB — CMP14+EGFR
ALT: 12 IU/L (ref 0–32)
AST: 20 IU/L (ref 0–40)
Albumin/Globulin Ratio: 1.5 (ref 1.2–2.2)
Albumin: 5 g/dL — ABNORMAL HIGH (ref 3.7–4.7)
Alkaline Phosphatase: 90 IU/L (ref 48–121)
BUN/Creatinine Ratio: 9 — ABNORMAL LOW (ref 12–28)
BUN: 12 mg/dL (ref 8–27)
Bilirubin Total: 0.7 mg/dL (ref 0.0–1.2)
CO2: 21 mmol/L (ref 20–29)
Calcium: 9.7 mg/dL (ref 8.7–10.3)
Chloride: 96 mmol/L (ref 96–106)
Creatinine, Ser: 1.35 mg/dL — ABNORMAL HIGH (ref 0.57–1.00)
GFR calc Af Amer: 44 mL/min/{1.73_m2} — ABNORMAL LOW (ref >59)
GFR calc non Af Amer: 38 mL/min/{1.73_m2} — ABNORMAL LOW (ref >59)
Globulin, Total: 3.3 g/dL (ref 1.5–4.5)
Glucose: 79 mg/dL (ref 65–99)
Potassium: 4.7 mmol/L (ref 3.5–5.2)
Sodium: 134 mmol/L (ref 134–144)
Total Protein: 8.3 g/dL (ref 6.0–8.5)

## 2020-04-14 LAB — HEPATITIS C ANTIBODY: Hep C Virus Ab: <0.1 s/co ratio (ref 0.0–0.9)

## 2020-04-14 LAB — THYROID PANEL WITH TSH
Free Thyroxine Index: 2.2 (ref 1.2–4.9)
T3 Uptake Ratio: 25 % (ref 24–39)
T4, Total: 8.6 ug/dL (ref 4.5–12.0)
TSH: 2.64 u[IU]/mL (ref 0.450–4.500)

## 2020-04-16 ENCOUNTER — Other Ambulatory Visit: Payer: Self-pay | Admitting: Family

## 2020-04-16 ENCOUNTER — Telehealth: Payer: Medicare HMO

## 2020-04-17 ENCOUNTER — Ambulatory Visit: Payer: Medicare HMO | Admitting: *Deleted

## 2020-04-17 DIAGNOSIS — E039 Hypothyroidism, unspecified: Secondary | ICD-10-CM | POA: Diagnosis not present

## 2020-04-17 DIAGNOSIS — I10 Essential (primary) hypertension: Secondary | ICD-10-CM | POA: Diagnosis not present

## 2020-04-17 DIAGNOSIS — I159 Secondary hypertension, unspecified: Secondary | ICD-10-CM | POA: Diagnosis not present

## 2020-04-17 DIAGNOSIS — E785 Hyperlipidemia, unspecified: Secondary | ICD-10-CM

## 2020-04-17 NOTE — Patient Instructions (Signed)
Visit Information  Goals Addressed              This Visit's Progress     Patient Stated   .  "I need help managing my thyroid" (pt-stated)   On track     [place] (see longitudinal plan of care for additional care plan information)  Current Barriers:  . Chronic Disease Management support and education needs related to hypothyroidism  Nurse Case Manager Clinical Goal(s):  Marland Kitchen Over the next 60 days, patient will talk with RN Care Manager regarding hypothyroidism . Over the next 90 days, patient will keep all scheduled medical appointments  Interventions:  . Inter-disciplinary care team collaboration (see longitudinal plan of care) . Chart reviewed including recent office notes and lab results . Discussed TSH . Reviewed medications . Reviewed upcoming appointments with patient including Dr Dorris Fetch on 04/27/20 . Encouraged patient to reach out to [facility] team as needed  Patient Self Care Activities:  . Performs ADL's independently . Performs IADL's independently  Please see past updates related to this goal by clicking on the "Past Updates" button in the selected goal      .  "I need to manage my cholesterol" (pt-stated)        [place] (see longitudinal plan of care for additional care plan information)  Current Barriers:  . Chronic Disease Management support and education needs related to hyperlipidemia  Nurse Case Manager Clinical Goal(s):  Marland Kitchen Over the next 90 days, patient will keep all scheduled medical appointments . Over the next 90 days, patient will take medications as prescribed . Over the next 60 days, patient will talk with RN Care Manager regarding hyperlipidemia  Interventions:  . Inter-disciplinary care team collaboration (see longitudinal plan of care) . Chart reviewed including recent office notes and lab results . Discussed lipids and triglycerides . Reviewed and discussed medications o Patient restarted lipitor about 5 weeks ago. Had d/c due to  leg pain. No pain at this time.  . Encouraged patient to take medications as prescribed . Recommended good quality OTC Omega 3 fish oil for triglycerides . Discussed diet and exercise . Reviewed upcoming appointments . Encouraged patient to reach out to [facility] team as needed  Patient Self Care Activities:  . Performs ADL's independently . Performs IADL's independently  Initial goal documentation     .  "I want to keep my blood pressure under control" (pt-stated)        CARE PLAN ENTRY (see longitudinal plan of care for additional care plan information)  Current Barriers:  . Chronic Disease Management support and education needs related to hypertension  Nurse Case Manager Clinical Goal(s):  Marland Kitchen Over the next 60 days, patient will talk with RN Care Manager regarding blood pressure control . Over the next 90 days, patient will keep all scheduled medical appointments . Over the next 90 days, patient will continue to demonstrate health care management by checking and recording blood pressure daily  Interventions:  . Inter-disciplinary care team collaboration (see longitudinal plan of care) . Chart reviewed including recent office notes and lab results . Reviewed and discussed medications o Avapro is being held due to hypotensive episodes . Encouraged patient to continue to check and record blood pressure readings daily . Patient reports normal home readings . Call PCP at [phone] with any readings outside of recommended range . Encouraged patient to reach out to [facility] team as needed  Patient Self Care Activities:  . Performs ADL's independently .  Performs IADL's independently  Initial goal documentation       Other   .  Elevated Kidney Functions        CARE PLAN ENTRY (see longitudinal plan of care for additional care plan information)  Current Barriers:  . Chronic Disease Management support and education needs related to chronically elevated creatinine without a diagnosis  of CKD  Nurse Case Manager Clinical Goal(s):  Marland Kitchen Over the next 60 days, patient will work with PCP regarding elevated kidney function levels  Interventions:  . Inter-disciplinary care team collaboration (see longitudinal plan of care) . Chart reviewed including recent office notes and lab results o Kidney function stable per PCP notes . Medications reviewed o No NSAID use . Previously communicated with PCP regarding whether an official CKD diagnosis needs to be made . Reviewed upcoming appointments . Encouraged patient to reach out to [facility] team as needed  Patient Self Care Activities:  . Performs ADL's independently . Performs IADL's independently  Initial goal documentation        Patient verbalizes understanding of instructions provided today.   Follow-up Plan The care management team will reach out to the patient again over the next 60 days.   Chong Sicilian, BSN, RN-BC Embedded Chronic Care Manager [facility] / [place] Management Direct Dial: [phone]

## 2020-04-17 NOTE — Chronic Care Management (AMB) (Addendum)
Chronic Care Management   Follow Up Note   04/17/2020 Name: Susan Contreras MRN: [id number] DOB: Jan 13, 1941  Referred by: [name], FNP Reason for referral : Chronic Care Management (RN follow up)   Susan Contreras is a 79 y.o. year old female who is a primary care patient of [name], FNP. The CCM team was consulted for assistance with chronic disease management and care coordination needs.    Review of patient status, including review of consultants reports, relevant laboratory and other test results, and collaboration with appropriate care team members and the patient's provider was performed as part of comprehensive patient evaluation and provision of chronic care management services.    SDOH (Social Determinants of Health) assessments performed: No See Care Plan activities for detailed interventions related to [facility])      Outpatient Encounter Medications as of 04/17/2020  Medication Sig   atorvastatin (LIPITOR) 20 MG tablet Take 1 tablet (20 mg total) by mouth daily.   ALPRAZolam (XANAX) 0.25 MG tablet TAKE 1 TABLET TWICE DAILY AS NEEDED FOR ANXIETY   Chlorphen-PE-Acetaminophen (NOREL AD PO) Take 1 tablet by mouth as needed.    fluticasone (FLONASE) 50 MCG/ACT nasal spray USE 1 SPRAY IN EACH NOSTRIL ONCE DAILY.   irbesartan (AVAPRO) 75 MG tablet TAKE 1 TABLET DAILY (Patient not taking: Reported on 04/17/2020)   levothyroxine (SYNTHROID) 50 MCG tablet Take 1 tablet (50 mcg total) by mouth daily.   meclizine (ANTIVERT) 25 MG tablet TAKE ONE TABLET BY MOUTH THREE TIMES DAILY AS NEEDED FOR DIZZINESS (Patient taking differently: Take 25 mg by mouth 3 (three) times daily as needed for dizziness. )   pantoprazole (PROTONIX) 20 MG tablet TAKE (1) TABLET TWICE A DAY BEFORE MEALS.   No facility-administered encounter medications on file as of 04/17/2020.     BP Readings from Last 3 Encounters:  04/13/20 114/72  03/09/20 (!) 144/74  01/20/20 129/74   Lab Results  Component  Value Date   CHOL 225 (H) 04/13/2020   HDL 51 04/13/2020   LDLCALC 121 (H) 04/13/2020   TRIG 302 (H) 04/13/2020   CHOLHDL 4.4 04/13/2020   Lab Results  Component Value Date   CREATININE 1.35 (H) 04/13/2020   BUN 12 04/13/2020   NA 134 04/13/2020   K 4.7 04/13/2020   CL 96 04/13/2020   CO2 21 04/13/2020   Lab Results  Component Value Date   TSH 2.640 04/13/2020     RN Care Plan              This Visit's Progress     Patient Stated     "I need help managing my thyroid" (pt-stated)   On track     [place] (see longitudinal plan of care for additional care plan information)  Current Barriers:  Chronic Disease Management support and education needs related to hypothyroidism  Nurse Case Manager Clinical Goal(s):  Over the next 60 days, patient will talk with RN Care Manager regarding hypothyroidism Over the next 90 days, patient will keep all scheduled medical appointments  Interventions:  Inter-disciplinary care team collaboration (see longitudinal plan of care) Chart reviewed including recent office notes and lab results Discussed TSH Reviewed medications Reviewed upcoming appointments with patient including Dr Dorris Fetch on 04/27/20 Encouraged patient to reach out to [facility] team as needed  Patient Self Care Activities:  Performs ADL's independently Performs IADL's independently  Please see past updates related to this goal by clicking on the "Past Updates" button in the  selected goal        "I need to manage my cholesterol" (pt-stated)        CARE PLAN ENTRY (see longitudinal plan of care for additional care plan information)  Current Barriers:  Chronic Disease Management support and education needs related to hyperlipidemia  Nurse Case Manager Clinical Goal(s):  Over the next 90 days, patient will keep all scheduled medical appointments Over the next 90 days, patient will take medications as prescribed Over the next 60 days, patient will talk with RN Care  Manager regarding hyperlipidemia  Interventions:  Inter-disciplinary care team collaboration (see longitudinal plan of care) Chart reviewed including recent office notes and lab results Discussed lipids and triglycerides Reviewed and discussed medications Patient restarted lipitor about 5 weeks ago. Had d/c due to leg pain. No pain at this time.  Encouraged patient to take medications as prescribed Recommended good quality OTC Omega 3 fish oil for triglycerides Discussed diet and exercise Reviewed upcoming appointments Encouraged patient to reach out to CCM team as needed  Patient Self Care Activities:  Performs ADL's independently Performs IADL's independently  Initial goal documentation       "I want to keep my blood pressure under control" (pt-stated)        [place] (see longitudinal plan of care for additional care plan information)  Current Barriers:  Chronic Disease Management support and education needs related to hypertension  Nurse Case Manager Clinical Goal(s):  Over the next 60 days, patient will talk with RN Care Manager regarding blood pressure control Over the next 90 days, patient will keep all scheduled medical appointments Over the next 90 days, patient will continue to demonstrate health care management by checking and recording blood pressure daily  Interventions:  Inter-disciplinary care team collaboration (see longitudinal plan of care) Chart reviewed including recent office notes and lab results Reviewed and discussed medications Avapro is being held due to hypotensive episodes Encouraged patient to continue to check and record blood pressure readings daily Patient reports normal home readings Call PCP at [phone] with any readings outside of recommended range Encouraged patient to reach out to [facility] team as needed  Patient Self Care Activities:  Performs ADL's independently Performs IADL's independently  Initial goal documentation        Other     Elevated Kidney Functions        CARE PLAN ENTRY (see longitudinal plan of care for additional care plan information)  Current Barriers:  Chronic Disease Management support and education needs related to chronically elevated creatinine without a diagnosis of CKD  Nurse Case Manager Clinical Goal(s):  Over the next 60 days, patient will work with PCP regarding elevated kidney function levels  Interventions:  Inter-disciplinary care team collaboration (see longitudinal plan of care) Chart reviewed including recent office notes and lab results Kidney function stable per PCP notes Medications reviewed No NSAID use Previously communicated with PCP regarding whether an official CKD diagnosis needs to be made Reviewed upcoming appointments Encouraged patient to reach out to [facility] team as needed  Patient Self Care Activities:  Performs ADL's independently Performs IADL's independently  Initial goal documentation          Plan:   The care management team will reach out to the patient again over the next 60 days.    Chong Sicilian, BSN, RN-BC Embedded Chronic Care Manager [facility] [place] Management Direct Dial: [phone]    I have reviewed the CCM documentation and agree with  the written assessment and plan of care.  [name], FNP

## 2020-04-18 LAB — TOXASSURE SELECT 13 (MW), URINE

## 2020-04-19 ENCOUNTER — Ambulatory Visit: Payer: Medicare HMO | Admitting: *Deleted

## 2020-04-19 DIAGNOSIS — F1320 Sedative, hypnotic or anxiolytic dependence, uncomplicated: Secondary | ICD-10-CM

## 2020-04-19 DIAGNOSIS — F411 Generalized anxiety disorder: Secondary | ICD-10-CM

## 2020-04-19 NOTE — Chronic Care Management (AMB) (Addendum)
°  Chronic Care Management   Note  04/19/2020 Name: Susan Contreras MRN: [id number] DOB: October 24, 1940  Incoming call and voicemail from patient regarding recent drug screen. She had questions about the results. Advised that drug screen results were as expected and all questions answered to patient's satisfaction.   Follow up plan: No further follow-up on this matter needed. F/u with PCP and CCM team as planned  Chong Sicilian, BSN, RN-BC [place] [facility] [place] Management Direct Dial: [phone]    I have reviewed the CCM documentation and agree with the written assessment and plan of care.  [name], FNP

## 2020-04-19 NOTE — Patient Instructions (Signed)
Follow up with PCP and CCM team as planned.   Chong Sicilian, BSN, RN-BC Embedded Chronic Care Manager [facility] / [place] Management Direct Dial: [phone]

## 2020-04-20 ENCOUNTER — Ambulatory Visit: Payer: Medicare HMO | Admitting: Licensed Clinical Social Worker

## 2020-04-20 DIAGNOSIS — E785 Hyperlipidemia, unspecified: Secondary | ICD-10-CM

## 2020-04-20 DIAGNOSIS — F411 Generalized anxiety disorder: Secondary | ICD-10-CM

## 2020-04-20 DIAGNOSIS — K2100 Gastro-esophageal reflux disease with esophagitis, without bleeding: Secondary | ICD-10-CM

## 2020-04-20 DIAGNOSIS — I10 Essential (primary) hypertension: Secondary | ICD-10-CM

## 2020-04-20 NOTE — Chronic Care Management (AMB) (Addendum)
Chronic Care Management    Clinical Social Work Follow Up Note  04/20/2020 Name: Susan Contreras Contreras MRN: [id number] DOB: [birth year]  Susan Contreras Contreras is a 79 y.o. year old female who is a primary care patient of Susan Contreras Balloon, FNP. The CCM team was consulted for assistance with Susan Contreras Contreras .   Review of patient status, including review of consultants reports, other relevant assessments, and collaboration with appropriate care team members and the patient's provider was performed as part of comprehensive patient evaluation and provision of chronic care management services.    SDOH (Social Determinants of Health) assessments performed: No;risk for tobacco use; risk for depression; risk for stress; risk for physical inactivity    Office Visit from 04/13/2020 in [place]  PHQ-9 Total Score 3         GAD 7 : Generalized Anxiety Score 04/13/2020 01/20/2020 11/08/2019 11/02/2019  Nervous, Anxious, on Edge 0 0 1 1  Control/stop worrying 0 0 1 0  Worry too much - different things 0 0 1 0  Trouble relaxing 0 0 1 0  Restless 0 0 0 0  Easily annoyed or irritable 1 0 1 0  Afraid - awful might happen 0 0 0 0  Total GAD 7 Score 1 0 5 1  Anxiety Difficulty Not difficult at all Not difficult at all Somewhat difficult -    Outpatient Encounter Medications as of 04/20/2020  Medication Sig   ALPRAZolam (XANAX) 0.25 MG tablet TAKE 1 TABLET TWICE DAILY AS NEEDED FOR ANXIETY   atorvastatin (LIPITOR) 20 MG tablet Take 1 tablet (20 mg total) by mouth daily.   Chlorphen-PE-Acetaminophen (NOREL AD PO) Take 1 tablet by mouth as needed.    fluticasone (FLONASE) 50 MCG/ACT nasal spray USE 1 SPRAY IN EACH NOSTRIL ONCE DAILY.   irbesartan (AVAPRO) 75 MG tablet TAKE 1 TABLET DAILY (Patient not taking: Reported on 04/17/2020)   levothyroxine (SYNTHROID) 50 MCG tablet Take 1 tablet (50 mcg total) by mouth daily.   meclizine (ANTIVERT) 25 MG tablet TAKE ONE TABLET BY MOUTH THREE TIMES DAILY AS  NEEDED FOR DIZZINESS (Patient taking differently: Take 25 mg by mouth 3 (three) times daily as needed for dizziness. )   pantoprazole (PROTONIX) 20 MG tablet TAKE (1) TABLET TWICE A DAY BEFORE MEALS.   No facility-administered encounter medications on file as of 04/20/2020.   Goals            Client will talk with LCSW in next 30 days to discuss anxiety /stress issues of concern for patinet at this time (pt-stated)      [name] (see longtitudinal plan of care for additional care plan information)  Current Barriers:  Mental health issues of concern (anxiety/stress) in client with Chronic Diagnoses of HTN, GAD, GERD, and HLD Decreased energy Transportation challenges.  Clinical Social Work Clinical Goal(s):  LCSW will talk with client in next 30 days to discuss client managenet of anxiety or stress issues.   Interventions: Talked with client about CCM program support services Talked with Susan Contreras about anxiety or stress issues she faces.    Talked with Susan Contreras Contreras about transport needs of client Talked with Susan Contreras Contreras about her appetite Talked with client about sleeping challenges Talked with client about her current social work needs Talked with client about energy level (decreased) Talked with client about ambulation needs of client Talked with client about mood of client (client said she gets irritable occasionally) Talked with Susan Contreras Contreras about her appointment with Dr, Susan Contreras Contreras, endocrinologist, on  04/27/2020. Client said some of her hair is falling out) Talked with client about vision of client  Patient Self Care Activities:  Takes medications as prescribed Attends scheduled medical appointments  Patient SELF CARE DEFICITS: Transportation challenges Sleeping challenges Mental health issues   Initial goal documentation    Follow Up Plan: LCSW will call client in next 4 weeks to discuss client management of anxiety or stress issues   Susan Contreras Contreras.Susan Contreras Contreras MSW, LCSW Licensed Clinical  Social Worker [facility] Care Management [phone]  I have reviewed the CCM documentation and agree with the written assessment and plan of care.  Susan Contreras Dun, FNP

## 2020-04-20 NOTE — Patient Instructions (Addendum)
Licensed Clinical Social Worker Visit Information  Goals we discussed today:     .  Client will talk with LCSW in next 30 days to discuss anxiety /stress issues of concern for patinet at this time (pt-stated)       [place] (see longtitudinal plan of care for additional care plan information)  Current Barriers:   Mental health issues of concern (anxiety/stress) in client with Chronic Diagnoses of HTN, GAD, GERD, and HLD  Decreased energy  Transportation challenges.  Clinical Social Work Clinical Goal(s):   LCSW will talk with client in next 30 days to discuss client managenet of anxiety or stress issues.   Interventions:  Talked with client about CCM program support services  Talked with Starkeisha about anxiety or stress issues she faces.     Talked with Theodore about transport needs of client  Talked with Illyria about her appetite  Talked with client about sleeping challenges  Talked with client about her current social work needs  Talked with client about energy level (decreased)  Talked with client about ambulation needs of client  Talked with client about mood of client (client said she gets irritable occasionally)  Talked with [name] about her appointment with Dr, Dorris Fetch, endocrinologist, on 04/27/2020. Client said some of her hair is falling out)  Talked with client about vision of client  Patient Self Care Activities:  Takes medications as prescribed Attends scheduled medical appointments  Patient SELF CARE DEFICITS: Transportation challenges Sleeping challenges Mental health issues   Initial goal documentation    Follow Up Plan:LCSW will call client in next 4 weeks to discuss client management of anxiety or stress issues  Materials Provided: No  The patient verbalized understanding of instructions provided today and declined a print copy of patient instruction materials.   Norva Riffle.Janis Sol MSW, LCSW Licensed Clinical Social  Worker [facility] [phone]

## 2020-04-23 IMAGING — CT CT ABD-PELV W/ CM
2 of 5 series · 16 of 46 positions shown, 18 images · IV contrast (APPLIED)
Comparison: 03/10/2018

CLINICAL DATA: Abdominal pain

EXAM:
CT ABDOMEN AND PELVIS WITH CONTRAST
TECHNIQUE: Multidetector CT imaging of the abdomen and pelvis was performed
using the standard protocol following bolus administration of
intravenous contrast.
CONTRAST:  100mL OMNIPAQUE IOHEXOL 300 MG/ML SOLN, additional oral
enteric contrast

[Series 2: axial st · axial · 0.76mm/px · z∈[-482,-72]mm · 13 of 96 slices shown, 15 images]
[im 7/96  soft-tissue]
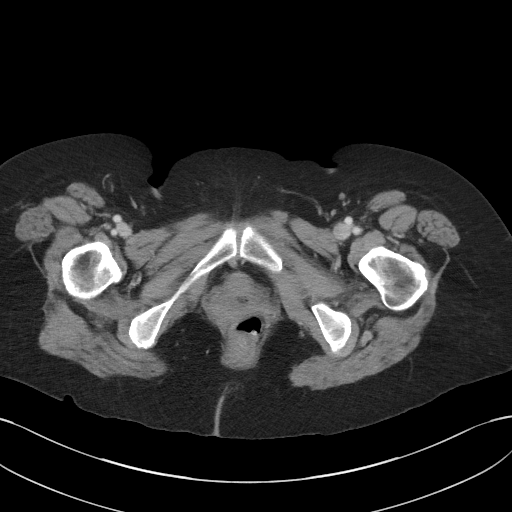
[im 7/96  bone]
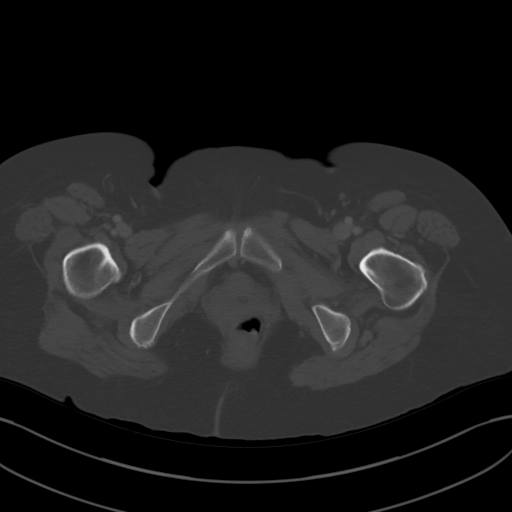
[im 14/96  soft-tissue]
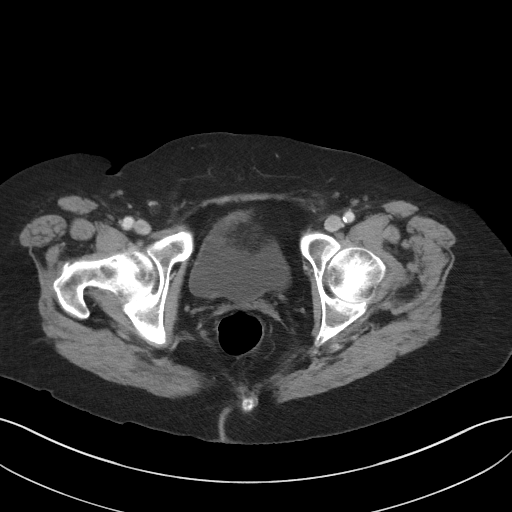
[im 21/96  soft-tissue]
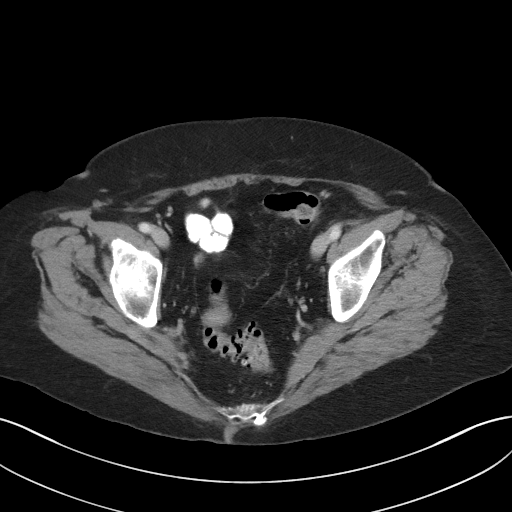
[im 28/96  soft-tissue]
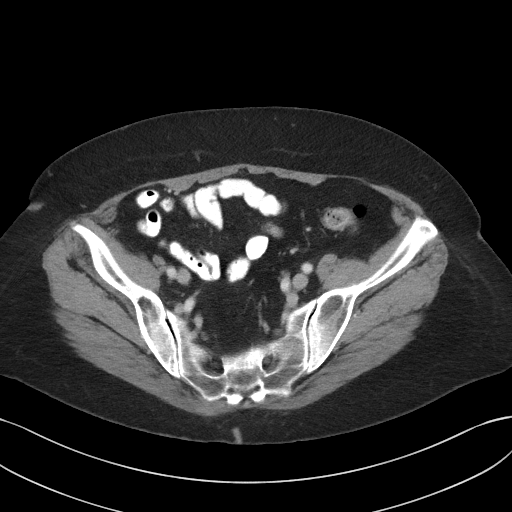
[im 34/96  soft-tissue]
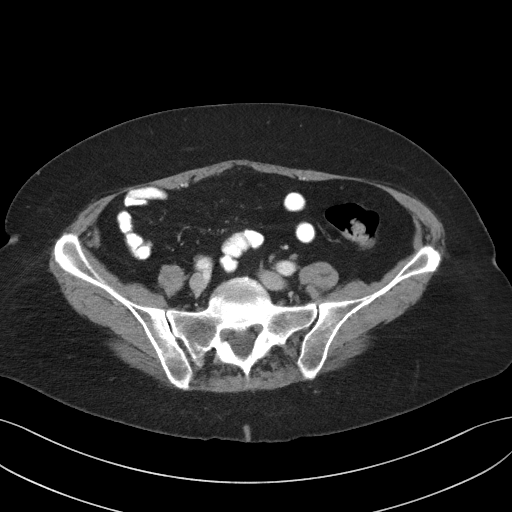
[im 41/96  soft-tissue]
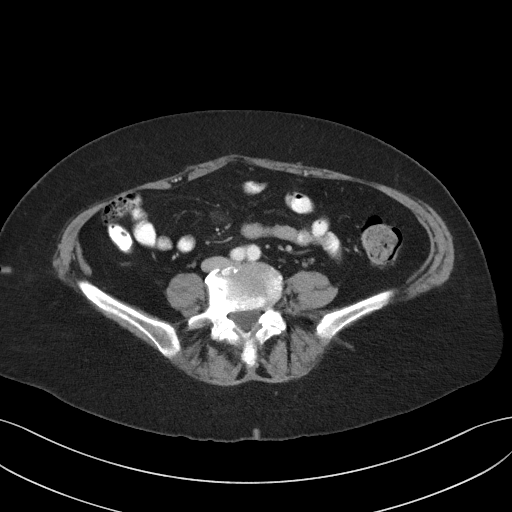
[im 48/96  soft-tissue]
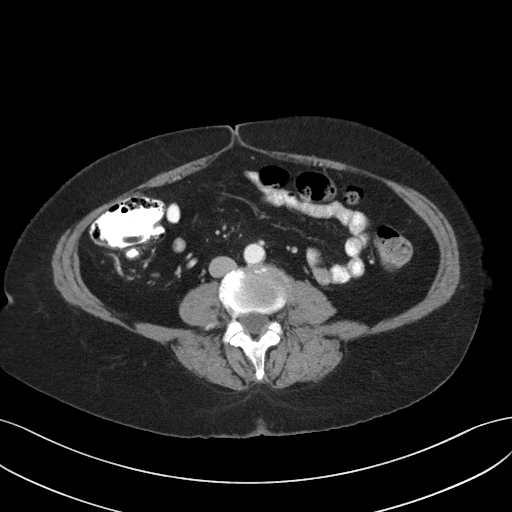
[im 55/96  soft-tissue]
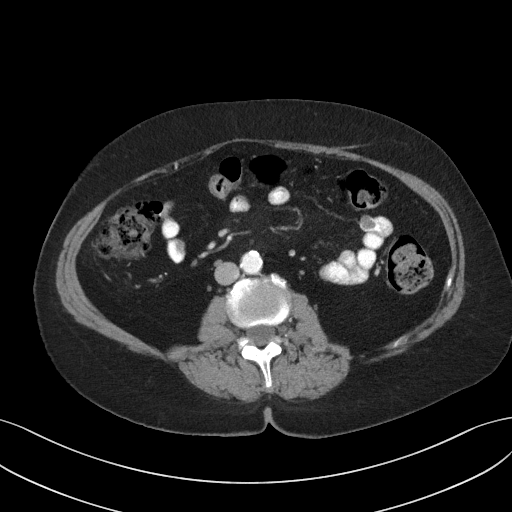
[im 62/96  soft-tissue]
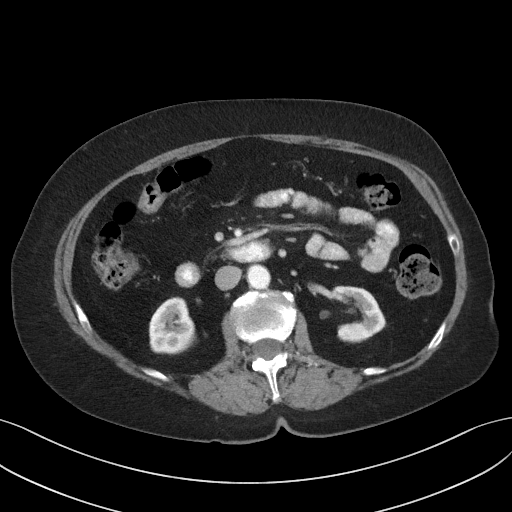
[im 62/96  bone]
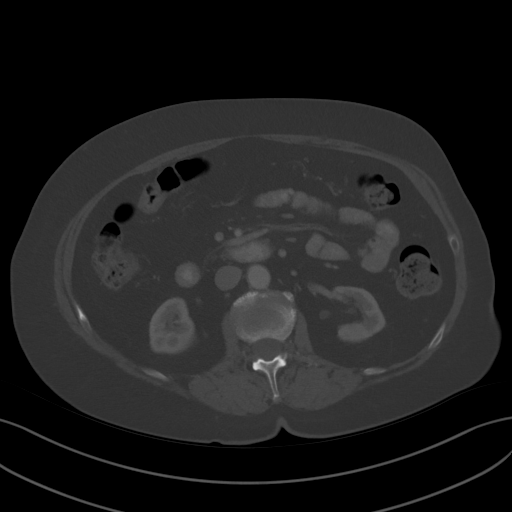
[im 68/96  soft-tissue]
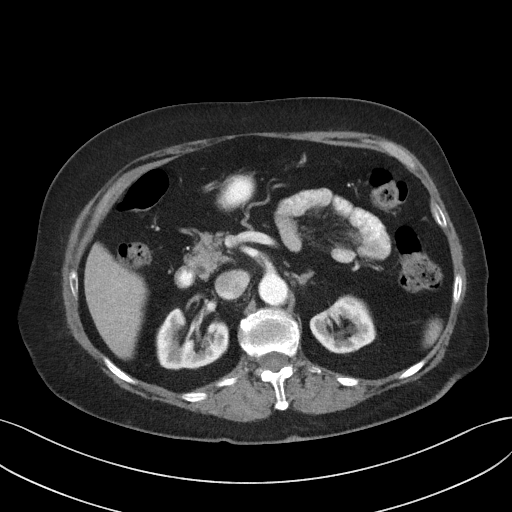
[im 75/96  soft-tissue]
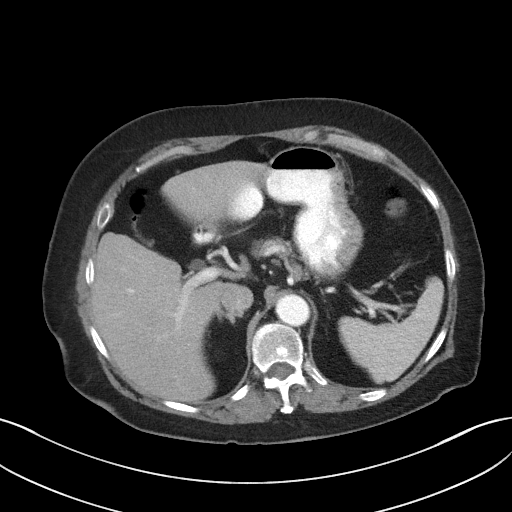
[im 82/96  soft-tissue]
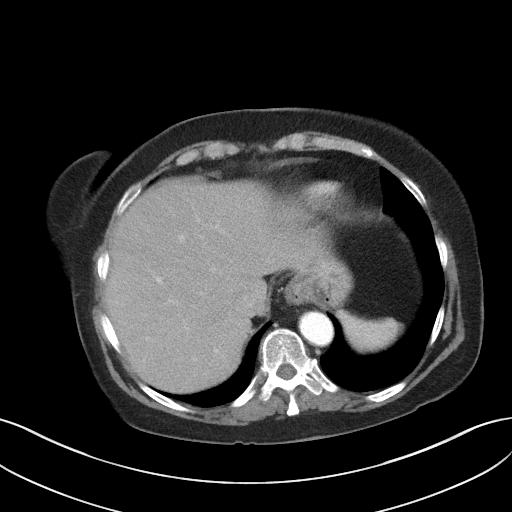
[im 89/96  soft-tissue]
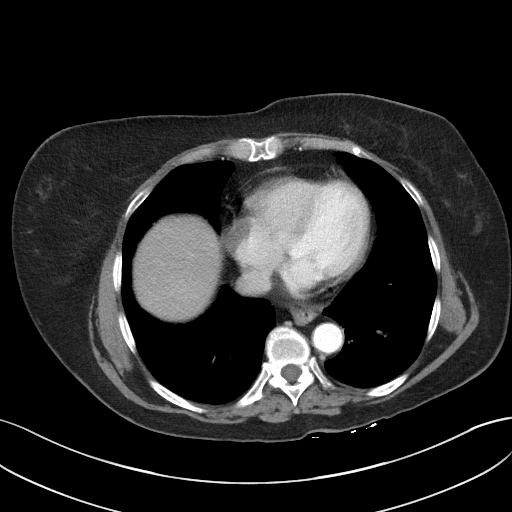

[Series 5: coronal st · coronal · 0.76mm/px · 3 of 85 slices shown]
[im 29/85  soft-tissue]
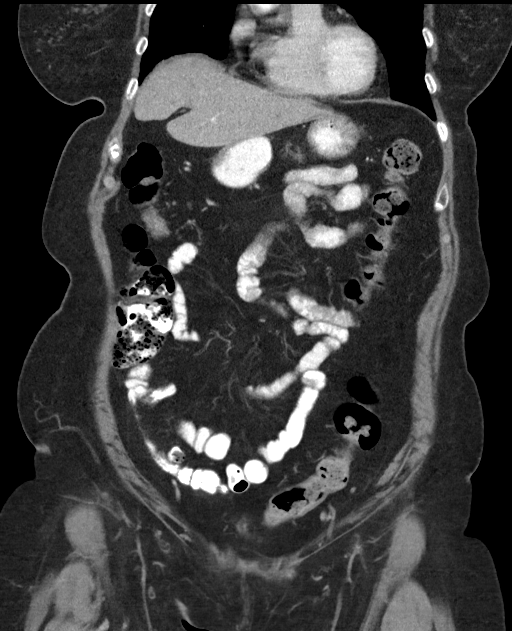
[im 38/85  soft-tissue]
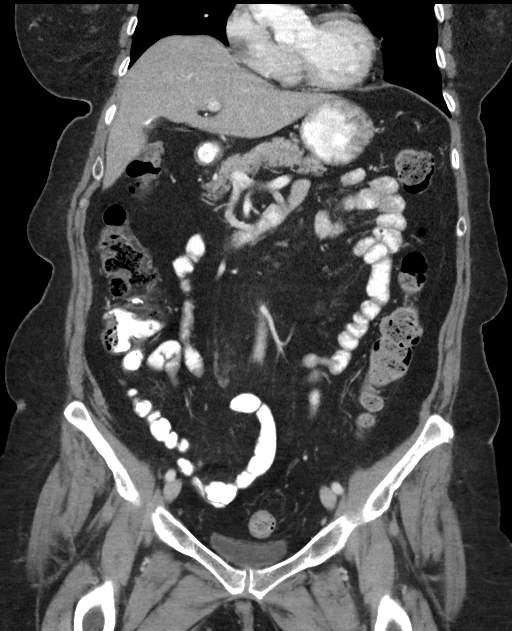
[im 47/85  soft-tissue]
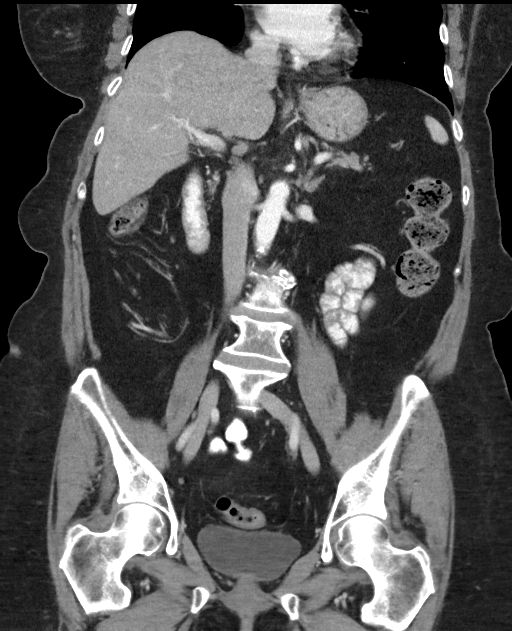

[16 of 46 positions shown; findings below may reference images not displayed]

FINDINGS: Lower chest: No acute abnormality.  Small hiatal hernia.

Hepatobiliary: No focal liver abnormality is seen. Status post
cholecystectomy. No biliary dilatation.

Pancreas: Unremarkable. No pancreatic ductal dilatation or
surrounding inflammatory changes.

Spleen: Normal in size without significant abnormality.

Adrenals/Urinary Tract: Adrenal glands are unremarkable. Kidneys are
normal, without renal calculi, solid lesion, or hydronephrosis.
Bladder is unremarkable.

Stomach/Bowel: Stomach is within normal limits. Appendix appears
normal. No evidence of bowel wall thickening, distention, or
inflammatory changes. Sigmoid diverticulosis.

Vascular/Lymphatic: Aortic atherosclerosis. No enlarged abdominal or
pelvic lymph nodes.

Reproductive: Status post hysterectomy.

Other: No abdominal wall hernia or abnormality. No abdominopelvic
ascites.

Musculoskeletal: No acute or significant osseous findings.
IMPRESSION: 1. No acute CT findings of the abdomen or pelvis to explain
abdominal pain.

2.  Sigmoid diverticulosis without evidence of acute diverticulitis.

3.  Status post cholecystectomy and hysterectomy.

4.  Aortic Atherosclerosis (513T6-N60.0).

## 2020-04-26 ENCOUNTER — Encounter (INDEPENDENT_AMBULATORY_CARE_PROVIDER_SITE_OTHER): Payer: Self-pay | Admitting: Gastroenterology

## 2020-04-26 ENCOUNTER — Other Ambulatory Visit: Payer: Self-pay

## 2020-04-26 ENCOUNTER — Ambulatory Visit (INDEPENDENT_AMBULATORY_CARE_PROVIDER_SITE_OTHER): Payer: Medicare HMO | Admitting: Gastroenterology

## 2020-04-26 VITALS — BP 126/78 | HR 80 | Temp 99.0°F | Ht 61.0 in | Wt 156.2 lb

## 2020-04-26 DIAGNOSIS — K219 Gastro-esophageal reflux disease without esophagitis: Secondary | ICD-10-CM

## 2020-04-26 DIAGNOSIS — Z860100 Personal history of colon polyps, unspecified: Secondary | ICD-10-CM | POA: Insufficient documentation

## 2020-04-26 NOTE — Patient Instructions (Signed)
Continue Poronix  20 mg q12h Explained presumed etiology of reflux symptoms. Instruction provided in the use of antireflux medication - patient should take medication in the morning 30-45 minutes before eating breakfast. Discussed avoidance of eating within 2 hours of lying down to sleep and benefit of blocks to elevate head of bed. If you present recurrent heartburn please call us to schedule EGD Please call us back if you decide to proceed with screening colonoscopy

## 2020-04-26 NOTE — Progress Notes (Signed)
Maylon Peppers, M.D. Gastroenterology & Hepatology [facility] [address] [place], [place] [address] Primary Care Physician: [name], [place] [address]  Referring MD: PCP  I will communicate my assessment and recommendations to the referring MD via EMR. Note: Occasional unusual wording and randomly placed punctuation marks may result from the use of speech recognition technology to transcribe this document"  Chief Complaint: Heartburn.  History of Present Illness: Susan Contreras is a 79 y.o. female with past medical history of anxiety, depression, GERD, hyperlipidemia, hypertension, hypothyroidism, who presents for evaluation of occasional episodes of heartburn.  The patient reported that she has had longstanding history of GERD for the last 20 years.  The patient reported she had partially controlled heartburn in the past while taking Dexilant for many years but she found it to be too expensive and the effect of the medication wore off with time.  Due to this, the patient was started by her PCP on Protonix 20 mg twice daily since April 2021.  She is states that occasionally she has some episodes of heartburn, maybe twice a month for which she takes Tums occasionally.  She has noticed that certain triggers such as pizza or spicy food lead to recurrent heartburn.  The patient denies having any dysphagia, odynophagia, recent weight loss, nausea, vomiting, fever, chills, hematochezia, melena, hematemesis, abdominal distention, abdominal pain, diarrhea, jaundice, pruritus.  Last EGD:12/2005 - 2 cm hiatal hernia, early stricture Last Colonoscopy: 2007 - diverticulosis, hemorrhoids.  FHx: neg for any gastrointestinal/liver disease, sister had history of colonic cancer at age 10, sister had tumor in adrenal gland. Social: neg smoking, alcohol or illicit drug use Surgical: Cholecystectomy, hysterectomy  Past  Medical History: Past Medical History:  Diagnosis Date  . Anxiety   . Depression   . Diverticulosis   . Esophageal stricture   . Family hx of colorectal cancer   . GERD (gastroesophageal reflux disease)   . Helicobacter pylori (H. pylori) 1997   EGD  . Hemorrhoids   . Hiatal hernia   . Hx of adenomatous colonic polyps   . Hyperlipemia   . Hypertension   . PUD (peptic ulcer disease) 1997   EGD  . Thyroid disease   . Vertigo     Past Surgical History: Past Surgical History:  Procedure Laterality Date  . ABDOMINAL HYSTERECTOMY    . BILATERAL OOPHORECTOMY    . CHOLECYSTECTOMY      Family History: Family History  Problem Relation Age of Onset  . Colon cancer Sister   . Stroke Mother   . Heart disease Mother   . Parkinson's disease Father   . Ovarian cancer Sister   . Esophageal cancer Neg Hx   . Stomach cancer Neg Hx   . Kidney disease Neg Hx   . Liver disease Neg Hx   . Diabetes Neg Hx     Social History: Social History   Tobacco Use  Smoking Status Never Smoker  Smokeless Tobacco Never Used   Social History   Substance and Sexual Activity  Alcohol Use No   Social History   Substance and Sexual Activity  Drug Use No    Allergies: Allergies  Allergen Reactions  . Biaxin [Clarithromycin]   . Cephalexin   . Ciprofloxacin   . Doxycycline     Headaches  . Flagyl [Metronidazole Hcl]   . Ketek [Telithromycin]   . Sulfa Antibiotics     Medications: Current Outpatient Medications  Medication Sig Dispense Refill  . ALPRAZolam (XANAX) 0.25 MG tablet TAKE 1 TABLET TWICE DAILY AS NEEDED FOR ANXIETY 60 tablet 5  . atorvastatin (LIPITOR) 20 MG tablet Take 1 tablet (20 mg total) by mouth daily. 90 tablet 3  . Chlorphen-PE-Acetaminophen (NOREL AD PO) Take 1 tablet by mouth as needed.     . fluticasone (FLONASE) 50 MCG/ACT nasal spray USE 1 SPRAY IN EACH NOSTRIL ONCE DAILY. 16 g 0  . irbesartan (AVAPRO) 75 MG tablet TAKE 1 TABLET DAILY 90 tablet 0  .  levothyroxine (SYNTHROID) 50 MCG tablet Take 1 tablet (50 mcg total) by mouth daily. 90 tablet 3  . meclizine (ANTIVERT) 25 MG tablet TAKE ONE TABLET BY MOUTH THREE TIMES DAILY AS NEEDED FOR DIZZINESS (Patient taking differently: Take 25 mg by mouth 3 (three) times daily as needed for dizziness. ) 30 tablet 0  . pantoprazole (PROTONIX) 20 MG tablet TAKE (1) TABLET TWICE A DAY BEFORE MEALS. 60 tablet 5   No current facility-administered medications for this visit.    Review of Systems: GENERAL: negative for malaise, night sweats HEENT: No changes in hearing or vision, no nose bleeds or other nasal problems. NECK: Negative for lumps, goiter, pain and significant neck swelling RESPIRATORY: Negative for cough, wheezing CARDIOVASCULAR: Negative for chest pain, leg swelling, palpitations, orthopnea GI: SEE HPI MUSCULOSKELETAL: Negative for joint pain or swelling, back pain, and muscle pain. SKIN: Negative for lesions, rash PSYCH: Negative for sleep disturbance, mood disorder and recent psychosocial stressors. HEMATOLOGY Negative for prolonged bleeding, bruising easily, and swollen nodes. ENDOCRINE: Negative for cold or heat intolerance, polyuria, polydipsia and goiter. NEURO: negative for tremor, gait imbalance, syncope and seizures. The remainder of the review of systems is noncontributory.   Physical Exam: BP 126/78 (BP Location: Right Arm, Patient Position: Sitting, Cuff Size: Normal)   Pulse 80   Temp 99 F (37.2 C) (Oral)   Ht 5\' 1"  (1.549 m)   Wt 156 lb 3.2 oz (70.9 kg)   BMI 29.51 kg/m  GENERAL: The patient is AO x3, in no acute distress. HEENT: Head is normocephalic and atraumatic. EOMI are intact. Mouth is well hydrated and without lesions. NECK: Supple. No masses LUNGS: Clear to auscultation. No presence of rhonchi/wheezing/rales. Adequate chest expansion HEART: RRR, normal s1 and s2. ABDOMEN: Soft, nontender, no guarding, no peritoneal signs, and nondistended. BS +. No  masses. EXTREMITIES: Without any cyanosis, clubbing, rash, lesions or edema. NEUROLOGIC: AOx3, no focal motor deficit. SKIN: no jaundice, no rashes   Imaging/Labs: as above  I personally reviewed and interpreted the available labs, imaging and endoscopic files.  Impression and Plan: Susan Contreras is a 79 y.o. female with past medical history of anxiety, depression, GERD, hyperlipidemia, hypertension, hypothyroidism, who presents for evaluation of occasional episodes of heartburn. The patient has presented improvement of her symptoms while taking her PPI at low-dose twice a day. I advised her about lifestyle recommendations to improve this. We also discussed about the possibility of taking the medication once a day dose of 40 mg but she would rather keep at the same frequency as this has controlled her symptoms adequately so far. Since she has not presented any red flag signs and her symptoms have been adequate, the patient was counseled to call us back if she has any recurrent heartburn at which time we will proceed with an EGD. Finally, I had a thorough discussion with the patient regarding proceeding with a colonoscopy to screen for colorectal cancer but she will think  about it and call us back once he has made the decision.  - Continue Protonix  20 mg q12h - Explained presumed etiology of reflux symptoms. Instruction provided in the use of antireflux medication - patient should take medication in the morning 30-45 minutes before eating breakfast. Discussed avoidance of eating within 2 hours of lying down to sleep and benefit of blocks to elevate head of bed. - If patient presents recurrent heartburn , she will call us to schedule EGD - Patient advised [place] to call back if you she decides to proceed with screening colonoscopy  All questions were answered.      Maylon Peppers, MD  Gastroenterology and Hepatology [facility]

## 2020-04-27 ENCOUNTER — Ambulatory Visit: Payer: Medicare HMO | Admitting: "Endocrinology

## 2020-04-27 ENCOUNTER — Encounter: Payer: Self-pay | Admitting: "Endocrinology

## 2020-04-27 VITALS — BP 104/70 | HR 80 | Ht 61.0 in | Wt 155.6 lb

## 2020-04-27 DIAGNOSIS — E559 Vitamin D deficiency, unspecified: Secondary | ICD-10-CM

## 2020-04-27 DIAGNOSIS — E039 Hypothyroidism, unspecified: Secondary | ICD-10-CM

## 2020-04-27 MED ORDER — VITAMIN D3 125 MCG (5000 UT) PO CAPS: 5000.0000 [IU] | ORAL_CAPSULE | Freq: Every day | ORAL | 0 refills | Status: AC

## 2020-04-27 NOTE — Progress Notes (Signed)
Endocrinology Consult Note                                         04/27/2020, 11:39 AM   Susan Contreras is a 79 y.o.-year-old female patient being seen in consultation for hypothyroidism referred by [name], FNP.   Past Medical History:  Diagnosis Date  . Anxiety   . Depression   . Diverticulosis   . Esophageal stricture   . Family hx of colorectal cancer   . GERD (gastroesophageal reflux disease)   . Helicobacter pylori (H. pylori) 1997   EGD  . Hemorrhoids   . Hiatal hernia   . Hx of adenomatous colonic polyps   . Hyperlipemia   . Hypertension   . PUD (peptic ulcer disease) 1997   EGD  . Thyroid disease   . Vertigo     Past Surgical History:  Procedure Laterality Date  . ABDOMINAL HYSTERECTOMY    . BILATERAL OOPHORECTOMY    . CHOLECYSTECTOMY      Social History   Socioeconomic History  . Marital status: Married    Spouse name: Not on file  . Number of children: 2  . Years of education: Not on file  . Highest education level: Not on file  Occupational History  . Occupation: Retired  Tobacco Use  . Smoking status: Never Smoker  . Smokeless tobacco: Never Used  Vaping Use  . Vaping Use: Never used  Substance and Sexual Activity  . Alcohol use: No  . Drug use: No  . Sexual activity: Not on file  Other Topics Concern  . Not on file  Social History Narrative   2 caffeine drinks daily    Social Determinants of Health   Financial Resource Strain:   . Difficulty of Paying Living Expenses: Not on file  Food Insecurity:   . Worried About Charity fundraiser in the Last Year: Not on file  . Ran Out of Food in the Last Year: Not on file  Transportation Needs: No Transportation Needs  . Lack of Transportation (Medical): No  . Lack of Transportation (Non-Medical): No  Physical Activity:   . Days of Exercise per Week: Not on file  . Minutes of Exercise per Session: Not on  file  Stress: Stress Concern Present  . Feeling of Stress : Rather much  Social Connections:   . Frequency of Communication with Friends and Family: Not on file  . Frequency of Social Gatherings with Friends and Family: Not on file  . Attends Religious Services: Not on file  . Active Member of Clubs or Organizations: Not on file  . Attends Archivist Meetings: Not on file  . Marital Status: Not on file    Family History  Problem Relation Age of Onset  . Colon cancer Sister   . Stroke Mother   . Heart disease Mother   . Osteoporosis Mother   . Parkinson's disease Father   .  Osteoporosis Father   . Ovarian cancer Sister   . Esophageal cancer Neg Hx   . Stomach cancer Neg Hx   . Kidney disease Neg Hx   . Liver disease Neg Hx   . Diabetes Neg Hx     Outpatient Encounter Medications as of 04/27/2020  Medication Sig  . ALPRAZolam (XANAX) 0.25 MG tablet TAKE 1 TABLET TWICE DAILY AS NEEDED FOR ANXIETY  . atorvastatin (LIPITOR) 20 MG tablet Take 1 tablet (20 mg total) by mouth daily.  . Chlorphen-PE-Acetaminophen (NOREL AD PO) Take 1 tablet by mouth as needed.   . Cholecalciferol (VITAMIN D3) 125 MCG (5000 UT) CAPS Take 1 capsule (5,000 Units total) by mouth daily.  . fluticasone (FLONASE) 50 MCG/ACT nasal spray USE 1 SPRAY IN EACH NOSTRIL ONCE DAILY.  Marland Kitchen irbesartan (AVAPRO) 75 MG tablet TAKE 1 TABLET DAILY  . levothyroxine (SYNTHROID) 50 MCG tablet Take 1 tablet (50 mcg total) by mouth daily.  . meclizine (ANTIVERT) 25 MG tablet TAKE ONE TABLET BY MOUTH THREE TIMES DAILY AS NEEDED FOR DIZZINESS (Patient taking differently: Take 25 mg by mouth 3 (three) times daily as needed for dizziness. )  . pantoprazole (PROTONIX) 20 MG tablet TAKE (1) TABLET TWICE A DAY BEFORE MEALS.   No facility-administered encounter medications on file as of 04/27/2020.    ALLERGIES: Allergies  Allergen Reactions  . Biaxin [Clarithromycin]   . Cephalexin   . Ciprofloxacin   . Doxycycline      Headaches  . Flagyl [Metronidazole Hcl]   . Ketek [Telithromycin]   . Sulfa Antibiotics    VACCINATION STATUS: Immunization History  Administered Date(s) Administered  . Moderna SARS-COVID-2 Vaccination 03/06/2020, 04/03/2020  . Tdap 03/15/2019     HPI    Susan Contreras  is a patient with the above medical history. she was diagnosed  with hypothyroidism at approximate age of 69 years .  This required initiation of thyroid hormone supplements.  She is currently on Synthroid 50 mcg p.o. daily before  Breakfast. She reports compliance taking his medication, taking it by itself on empty stomach with water.  I reviewed patient's  thyroid tests: Her most recent 2 labs show only TSH on target 2.6 and 2.3.   Pt describes: Progressive weight gain, fatigue, cold intolerance, mood swings.  Pt denies feeling nodules in neck, hoarseness, dysphagia/odynophagia, SOB with lying down.  she denies family history of  thyroid disorders, nor any family history of thyroid malignancy.  No history of  radiation therapy to head or neck. No recent use of iodine supplements.  I reviewed her chart and she also has a history of hypertension and hyperlipidemia on treatment.   ROS:  Constitutional: no weight gain/loss, no fatigue, no subjective hyperthermia, no subjective hypothermia Eyes: no blurry vision, no xerophthalmia ENT: no sore throat, no nodules palpated in throat, no dysphagia/odynophagia, no hoarseness Cardiovascular: no Chest Pain, no Shortness of Breath, no palpitations, no leg swelling Respiratory: no cough, no SOB Gastrointestinal: no Nausea/Vomiting/Diarhhea Musculoskeletal: no muscle/joint aches Skin: no rashes Neurological: no tremors, no numbness, no tingling, no dizziness Psychiatric: no depression, no anxiety   Physical Exam: BP 104/70   Pulse 80   Ht 5\' 1"  (1.549 m)   Wt 155 lb 9.6 oz (70.6 kg)   BMI 29.40 kg/m  Wt Readings from Last 3 Encounters:  04/27/20 155 lb 9.6 oz  (70.6 kg)  04/26/20 156 lb 3.2 oz (70.9 kg)  04/13/20 155 lb 3.2 oz (70.4 kg)    Constitutional:  Body mass index is 29.4 kg/m., not in acute distress, normal state of mind Eyes: PERRLA, EOMI, no exophthalmos ENT: moist mucous membranes, no thyromegaly, no cervical lymphadenopathy Cardiovascular: normal precordial activity, Regular Rate and Rhythm, no Murmur/Rubs/Gallops Respiratory:  adequate breathing efforts, no gross chest deformity, Clear to auscultation bilaterally Gastrointestinal: abdomen soft, Non -tender, No distension, Bowel Sounds present Musculoskeletal: no gross deformities, strength intact in all four extremities Skin: moist, warm, no rashes Neurological: no tremor with outstretched hands, Deep tendon reflexes normal in all four extremities.   CMP ( most recent) CMP     Component Value Date/Time   NA 134 04/13/2020 1226   K 4.7 04/13/2020 1226   CL 96 04/13/2020 1226   CO2 21 04/13/2020 1226   GLUCOSE 79 04/13/2020 1226   GLUCOSE 86 10/29/2019 1629   BUN 12 04/13/2020 1226   CREATININE 1.35 (H) 04/13/2020 1226   CREATININE 1.40 (H) 02/23/2013 0926   CALCIUM 9.7 04/13/2020 1226   PROT 8.3 04/13/2020 1226   ALBUMIN 5.0 (H) 04/13/2020 1226   AST 20 04/13/2020 1226   ALT 12 04/13/2020 1226   ALKPHOS 90 04/13/2020 1226   BILITOT 0.7 04/13/2020 1226   GFRNONAA 38 (L) 04/13/2020 1226   GFRAA 44 (L) 04/13/2020 1226      Lipid Panel ( most recent) Lipid Panel     Component Value Date/Time   CHOL 225 (H) 04/13/2020 1226   CHOL 291 (H) 02/23/2013 0926   TRIG 302 (H) 04/13/2020 1226   TRIG 607 (H) 02/23/2013 0926   HDL 51 04/13/2020 1226   HDL 38 (L) 02/23/2013 0926   CHOLHDL 4.4 04/13/2020 1226   LDLCALC 121 (H) 04/13/2020 1226   LDLCALC 132 (H) 02/23/2013 0926   LABVLDL 53 (H) 04/13/2020 1226       Lab Results  Component Value Date   TSH 2.640 04/13/2020   TSH 2.320 01/12/2020   TSH 0.370 (L) 11/29/2019   TSH 2.190 06/17/2019   TSH 1.760  12/28/2018   TSH 2.260 03/02/2018   TSH 2.100 02/03/2017   TSH 5.090 (H) 07/08/2016   TSH 3.680 08/14/2015   TSH 3.08 04/02/2015       ASSESSMENT: 1. Hypothyroidism  PLAN:    Patient with recent onset hypothyroidism on low-dose Synthroid at 50 mcg p.o. daily before breakfast.    - On physical exam , patient  does not  have  gross goiter, thyroid nodules, or neck compression symptoms.  She does have nonspecific symptoms which are out of proportion to the thyroid dysfunction she has.  I do long discussion with her about the decision to make changes in her thyroid dose will depend only on the labs and not on body symptoms.   -She will be kept on her current dose of Synthroid 50 mcg daily before breakfast, until she gets complete set of thyroid function test before her next visit. - We discussed about correct intake of levothyroxine, at fasting, with water, separated by at least 30 minutes from breakfast, and separated by more than 4 hours from calcium, iron, multivitamins, acid reflux medications (PPIs). -Patient is made aware of the fact that thyroid hormone replacement is needed for life, dose to be adjusted by periodic monitoring of thyroid function tests. - -Due to absence of clinical goiter, no need for thyroid ultrasound. -She would benefit from continued treatment with vitamin D preparations.  I discussed initiated vitamin D3 5000 units daily for 90 days.  She is advised to maintain close follow-up with  her PCP [name], FNP.  - Time spent with the patient: 45 minutes, of which >50% was spent in obtaining information about her symptoms, reviewing her previous labs, evaluations, and treatments, counseling her about her hypothyroidism, vitamin D deficiency, and developing a plan to confirm the diagnosis and long term treatment as necessary. Please refer to " Patient Self Inventory" in the Media  tab for reviewed elements of pertinent patient history.  [name] participated  in the discussions, expressed understanding, and voiced agreement with the above plans.  All questions were answered to her satisfaction. she is encouraged to contact clinic should she have any questions or concerns prior to her return visit.  Return in about 3 months (around 07/27/2020) for F/U with Pre-visit Labs.  Glade Lloyd, MD [facility] [address] [place], [place] [address] Phone: [phone]  Fax: [phone]   04/27/2020, 11:39 AM  This note was partially dictated with voice recognition software. Similar sounding words can be transcribed inadequately or may not  be corrected upon review.

## 2020-05-01 ENCOUNTER — Ambulatory Visit (INDEPENDENT_AMBULATORY_CARE_PROVIDER_SITE_OTHER): Payer: Medicare HMO | Admitting: *Deleted

## 2020-05-01 DIAGNOSIS — E785 Hyperlipidemia, unspecified: Secondary | ICD-10-CM

## 2020-05-01 DIAGNOSIS — I10 Essential (primary) hypertension: Secondary | ICD-10-CM

## 2020-05-01 DIAGNOSIS — K2100 Gastro-esophageal reflux disease with esophagitis, without bleeding: Secondary | ICD-10-CM

## 2020-05-01 DIAGNOSIS — E039 Hypothyroidism, unspecified: Secondary | ICD-10-CM

## 2020-05-03 ENCOUNTER — Encounter: Payer: Self-pay | Admitting: Family

## 2020-05-03 ENCOUNTER — Other Ambulatory Visit: Payer: Self-pay

## 2020-05-03 ENCOUNTER — Ambulatory Visit (INDEPENDENT_AMBULATORY_CARE_PROVIDER_SITE_OTHER): Payer: Medicare HMO | Admitting: Family

## 2020-05-03 VITALS — BP 130/73 | HR 73 | Temp 97.0°F | Ht 61.0 in | Wt 157.8 lb

## 2020-05-03 DIAGNOSIS — I159 Secondary hypertension, unspecified: Secondary | ICD-10-CM

## 2020-05-03 NOTE — Patient Instructions (Signed)

## 2020-05-03 NOTE — Progress Notes (Signed)
° °  Subjective:    Patient ID: Susan Contreras, female    DOB: [birth year], 79 y.o.   MRN: [id number]  Chief Complaint  Patient presents with   Blood Pressure Check    stated she help BP med for a week and then went back on it    Pt presents to the office today to recheck HTN. Her BP was 94/64 on her last visit and we held her irbesartan 75 mg. She states she held it for a week, but then noticed her BP was elevated 141/82. She restarted and her BP is at goal today.  Hypertension This is a chronic problem. The current episode started more than 1 year ago. The problem has been resolved since onset. The problem is controlled. Associated symptoms include malaise/fatigue. Pertinent negatives include no peripheral edema or shortness of breath. Past treatments include angiotensin blockers. The current treatment provides moderate improvement.      Review of Systems  Constitutional: Positive for malaise/fatigue.  Respiratory: Negative for shortness of breath.   All other systems reviewed and are negative.      Objective:   Physical Exam Vitals reviewed.  Constitutional:      General: She is not in acute distress.    Appearance: She is well-developed.  HENT:     Head: Normocephalic and atraumatic.     Right Ear: Tympanic membrane normal.     Left Ear: Tympanic membrane normal.  Eyes:     Pupils: Pupils are equal, round, and reactive to light.  Neck:     Thyroid: No thyromegaly.  Cardiovascular:     Rate and Rhythm: Normal rate and regular rhythm.     Heart sounds: Normal heart sounds. No murmur heard.   Pulmonary:     Effort: Pulmonary effort is normal. No respiratory distress.     Breath sounds: Normal breath sounds. No wheezing.  Abdominal:     General: Bowel sounds are normal. There is no distension.     Palpations: Abdomen is soft.     Tenderness: There is no abdominal tenderness.  Musculoskeletal:        General: No tenderness. Normal range of motion.     Cervical back: Normal  range of motion and neck supple.  Skin:    General: Skin is warm and dry.  Neurological:     Mental Status: She is alert and oriented to person, place, and time.     Cranial Nerves: No cranial nerve deficit.     Deep Tendon Reflexes: Reflexes are normal and symmetric.  Psychiatric:        Behavior: Behavior normal.        Thought Content: Thought content normal.        Judgment: Judgment normal.       BP 130/73    Pulse 73    Temp (!) 97 F (36.1 C) (Temporal)    Ht 5\' 1"  (1.549 m)    Wt 157 lb 12.8 oz (71.6 kg)    SpO2 99%    BMI 29.82 kg/m      Assessment & Plan:  Susan Contreras comes in today with chief complaint of Blood Pressure Check (stated she help BP med for a week and then went back on it )   Diagnosis and orders addressed:  1. Secondary hypertension Continue irbesartan daily -Dash diet information given -Exercise encouraged - Stress Management  -Continue current meds -RTO in 6 months    [name], FNP

## 2020-05-11 NOTE — Patient Instructions (Signed)
Visit Information  Goals Addressed              This Visit's Progress     Patient Stated   .  "I need help managing my thyroid" (pt-stated)        CARE PLAN ENTRY (see longitudinal plan of care for additional care plan information)  Current Barriers:  . Chronic Disease Management support and education needs related to hypothyroidism  Nurse Case Manager Clinical Goal(s):  Marland Kitchen Over the next 60 days, patient will talk with RN Care Manager regarding hypothyroidism . Over the next 90 days, patient will keep all scheduled medical appointments  Interventions:  . Inter-disciplinary care team collaboration (see longitudinal plan of care) . Chart reviewed including recent office notes and lab results . Discussed visit with Dr Dorris Fetch and upcoming follow up appt . Reviewed and discussed medications: Synthroid and how to take it . Keep all follow-up appointments . Encouraged patient to reach out to [facility] as needed  Patient Self Care Activities:  . Performs ADL's independently . Performs IADL's independently  Please see past updates related to this goal by clicking on the "Past Updates" button in the selected goal        Other   .  "I need help managing GERD and abominal pain"   On track     [place] (see longitudinal plan of care for additional care plan information)  Current Barriers:  . Chronic Disease Management support and education needs related to GERD  in a patient with hyperlipidemia, hypothyroidism, and hypertension  Nurse Case Manager Clinical Goal(s):  Marland Kitchen Over the next 30 days, patient will have an appointment with GI specialist . Over the next 90 days, patient will keep all scheduled medical appointments  Interventions:  . Inter-disciplinary care team collaboration (see longitudinal plan of care) . Chart reviewed including office notes and lab results . Discussed appointment and follow-up with GI . Reviewed and discussed medications . Encouraged patient to take  medications as prescribed . Discussed diet . Encouraged patient to reach out to [facility] team as needed  Patient Self Care Activities:  . Performs ADL's independently . Performs IADL's independently   Please see past updates related to this goal by clicking on the "Past Updates" button in the selected goal         Patient verbalizes understanding of instructions provided today.   Follow-up Plan The care management team will reach out to the patient again over the next 30 days.   Chong Sicilian, BSN, RN-BC Embedded Chronic Care Manager [facility] / [place] Management Direct Dial: [phone]

## 2020-05-11 NOTE — Chronic Care Management (AMB) (Signed)
Chronic Care Management   Follow Up Note   05/01/2020 Name: Susan Contreras MRN: [id number] DOB: 20-Jan-1941  Referred by: [name], FNP Reason for referral : Chronic Care Management (RN follow up)   Susan Contreras is a 79 y.o. year old female who is a primary care patient of [name], FNP. The CCM team was consulted for assistance with chronic disease management and care coordination needs.    Review of patient status, including review of consultants reports, relevant laboratory and other test results, and collaboration with appropriate care team members and the patient's provider was performed as part of comprehensive patient evaluation and provision of chronic care management services.    SDOH (Social Determinants of Health) assessments performed: No See Care Plan activities for detailed interventions related to [facility])     Outpatient Encounter Medications as of 05/01/2020  Medication Sig  . ALPRAZolam (XANAX) 0.25 MG tablet TAKE 1 TABLET TWICE DAILY AS NEEDED FOR ANXIETY  . atorvastatin (LIPITOR) 20 MG tablet Take 1 tablet (20 mg total) by mouth daily.  . Chlorphen-PE-Acetaminophen (NOREL AD PO) Take 1 tablet by mouth as needed.   . Cholecalciferol (VITAMIN D3) 125 MCG (5000 UT) CAPS Take 1 capsule (5,000 Units total) by mouth daily.  . fluticasone (FLONASE) 50 MCG/ACT nasal spray USE 1 SPRAY IN EACH NOSTRIL ONCE DAILY.  Marland Kitchen irbesartan (AVAPRO) 75 MG tablet TAKE 1 TABLET DAILY  . levothyroxine (SYNTHROID) 50 MCG tablet Take 1 tablet (50 mcg total) by mouth daily.  . meclizine (ANTIVERT) 25 MG tablet TAKE ONE TABLET BY MOUTH THREE TIMES DAILY AS NEEDED FOR DIZZINESS (Patient taking differently: Take 25 mg by mouth 3 (three) times daily as needed for dizziness. )  . pantoprazole (PROTONIX) 20 MG tablet TAKE (1) TABLET TWICE A DAY BEFORE MEALS.   No facility-administered encounter medications on file as of 05/01/2020.     RN Care Plan: Goals Addressed              This  Visit's Progress     Patient Stated   .  "I need help managing my thyroid" (pt-stated)        CARE PLAN ENTRY (see longitudinal plan of care for additional care plan information)  Current Barriers:  . Chronic Disease Management support and education needs related to hypothyroidism  Nurse Case Manager Clinical Goal(s):  Marland Kitchen Over the next 60 days, patient will talk with RN Care Manager regarding hypothyroidism . Over the next 90 days, patient will keep all scheduled medical appointments  Interventions:  . Inter-disciplinary care team collaboration (see longitudinal plan of care) . Chart reviewed including recent office notes and lab results . Discussed visit with Dr Dorris Fetch and upcoming follow up appt . Reviewed and discussed medications: Synthroid and how to take it . Keep all follow-up appointments . Encouraged patient to reach out to [facility] as needed  Patient Self Care Activities:  . Performs ADL's independently . Performs IADL's independently  Please see past updates related to this goal by clicking on the "Past Updates" button in the selected goal      .  "I need help managing GERD and abominal pain"   On track     [place] (see longitudinal plan of care for additional care plan information)  Current Barriers:  . Chronic Disease Management support and education needs related to GERD  in a patient with hyperlipidemia, hypothyroidism, and hypertension  Nurse Case Manager Clinical Goal(s):  Marland Kitchen Over the next 30 days,  patient will have an appointment with GI specialist . Over the next 90 days, patient will keep all scheduled medical appointments  Interventions:  . Inter-disciplinary care team collaboration (see longitudinal plan of care) . Chart reviewed including office notes and lab results . Discussed appointment and follow-up with GI . Reviewed and discussed medications . Encouraged patient to take medications as prescribed . Discussed diet . Encouraged patient to  reach out to [facility] team as needed  Patient Self Care Activities:  . Performs ADL's independently . Performs IADL's independently   Please see past updates related to this goal by clicking on the "Past Updates" button in the selected goal          Plan:   The care management team will reach out to the patient again over the next 30 days.   Chong Sicilian, BSN, RN-BC Embedded Chronic Care Manager [facility] / [place] Management Direct Dial: [phone]

## 2020-05-22 ENCOUNTER — Ambulatory Visit: Payer: Medicare HMO | Admitting: *Deleted

## 2020-05-22 ENCOUNTER — Other Ambulatory Visit: Payer: Self-pay | Admitting: Family

## 2020-05-22 DIAGNOSIS — I10 Essential (primary) hypertension: Secondary | ICD-10-CM

## 2020-05-22 NOTE — Patient Instructions (Signed)
RN will call patient tomorrow morning.   Susan Contreras, BSN, RN-BC Embedded Chronic Care Manager [facility] / [place] Management Direct Dial: [phone]

## 2020-05-22 NOTE — Chronic Care Management (AMB) (Signed)
  Chronic Care Management   Note  05/22/2020 Name: Susan Contreras MRN: [id number] DOB: [birth year]  Incoming call from patient. I returned her call but she was unable to talk at the time because she was talking with her son on another line. She stated that it wasn't an emergency and requested that I call back tomorrow.   Follow up plan: Patient scheduled for telephone call with [place] for tomorrow morning  Chong Sicilian, BSN, RN-BC [place] / [place] Management Direct Dial: [phone]

## 2020-05-23 ENCOUNTER — Ambulatory Visit: Payer: Medicare HMO | Admitting: *Deleted

## 2020-05-23 DIAGNOSIS — E039 Hypothyroidism, unspecified: Secondary | ICD-10-CM

## 2020-05-23 DIAGNOSIS — E785 Hyperlipidemia, unspecified: Secondary | ICD-10-CM

## 2020-05-23 DIAGNOSIS — I10 Essential (primary) hypertension: Secondary | ICD-10-CM

## 2020-05-23 NOTE — Chronic Care Management (AMB) (Addendum)
Chronic Care Management   Follow Up Note   05/23/2020 Name: Susan Contreras MRN: [id number] DOB: April 29, 1941  Referred by: [name], FNP Reason for referral : Chronic Care Management (RN follow up: [employer]give strain)   Susan Contreras is a 79 y.o. year old female who is a primary care patient of [name], FNP. The CCM team was consulted for assistance with chronic disease management and care coordination needs.    Review of patient status, including review of consultants reports, relevant laboratory and other test results, and collaboration with appropriate care team members and the patient's provider was performed as part of comprehensive patient evaluation and provision of chronic care management services.    SDOH (Social Determinants of Health) assessments performed: No See Care Plan activities for detailed interventions related to [facility])      Outpatient Encounter Medications as of 05/23/2020  Medication Sig   ALPRAZolam (XANAX) 0.25 MG tablet TAKE 1 TABLET TWICE DAILY AS NEEDED FOR ANXIETY   atorvastatin (LIPITOR) 20 MG tablet Take 1 tablet (20 mg total) by mouth daily.   Chlorphen-PE-Acetaminophen (NOREL AD PO) Take 1 tablet by mouth as needed.    Cholecalciferol (VITAMIN D3) 125 MCG (5000 UT) CAPS Take 1 capsule (5,000 Units total) by mouth daily.   fluticasone (FLONASE) 50 MCG/ACT nasal spray USE 1 SPRAY IN EACH NOSTRIL ONCE DAILY.   irbesartan (AVAPRO) 75 MG tablet TAKE 1 TABLET DAILY   levothyroxine (SYNTHROID) 50 MCG tablet Take 1 tablet (50 mcg total) by mouth daily.   meclizine (ANTIVERT) 25 MG tablet TAKE ONE TABLET BY MOUTH THREE TIMES DAILY AS NEEDED FOR DIZZINESS (Patient taking differently: Take 25 mg by mouth 3 (three) times daily as needed for dizziness. )   pantoprazole (PROTONIX) 20 MG tablet TAKE (1) TABLET TWICE A DAY BEFORE MEALS.   No facility-administered encounter medications on file as of 05/23/2020.     Goals Addressed               This  Visit's Progress     Patient Stated     "I need help managing my anxiety/stress" (pt-stated)        CARE PLAN ENTRY (see longitudinal plan of care for additional care plan information)  Anxiety in a patient with HTN, GAD, GERD, HLD, Hypothyroidism  Current Barriers:  Care Coordination needs related to anxiety and caregiver strain in a patient with HTN, hypothyroidism, depression, and anxiety  Nurse Case Manager Clinical Goal(s):  Over the next 90 days, patient will continue to talk with CCM team regarding stress and anxiety management  Interventions:  Inter-disciplinary care team collaboration (see longitudinal plan of care) Spoke with patient by telephone regarding stress and anxiety related to husband's medical conditions. Husband is a patient at [facility] but is not enrolled in CCM services. Discussed her concerns at length.  Collaborated with [facility] Nurse Team Lead, Georgina Pillion, LPN and asked that she contact patient/husband to discuss recent test results and need for CT scan Entered telephone message in husband, Doyce Stonehouse chart which details our conversation Encouraged patient to talk with LCSW regarding psychosocial issues Patient previously provided with CCM contact information and encouraged to reach out as needed  Patient Self Care Activities:  Performs ADL's independently Performs IADL's independently  Please see past updates related to this goal by clicking on the "Past Updates" button in the selected goal           Plan:   The care management team will reach out to  the patient again over the next 45 days.    Chong Sicilian, BSN, RN-BC Embedded Chronic Care Manager [facility] / [place] Management Direct Dial: [phone]     I have reviewed the CCM documentation and agree with the written assessment and plan of care.  [name], FNP

## 2020-05-23 NOTE — Patient Instructions (Signed)
Visit Information  Goals Addressed              This Visit's Progress     Patient Stated   .  "I need help managing my anxiety/stress" (pt-stated)        CARE PLAN ENTRY (see longitudinal plan of care for additional care plan information)  Anxiety in a patient with HTN, GAD, GERD, HLD, Hypothyroidism  Current Barriers:  . Care Coordination needs related to anxiety and caregiver strain in a patient with HTN, hypothyroidism, depression, and anxiety  Nurse Case Manager Clinical Goal(s):  Marland Kitchen Over the next 90 days, patient will continue to talk with CCM team regarding stress and anxiety management  Interventions:  . Inter-disciplinary care team collaboration (see longitudinal plan of care) . Spoke with patient by telephone regarding stress and anxiety related to husband's medical conditions. Husband is a patient at [facility] but is not enrolled in CCM services. Discussed her concerns at length.  Nash Dimmer with [place] Nurse Team Lead, Georgina Pillion, LPN and asked that she contact patient/husband to discuss recent test results and need for CT scan . Entered telephone message in husband, Sweetie Giebler chart which details our conversation . Encouraged patient to talk with LCSW regarding psychosocial issues . Patient previously provided with CCM contact information and encouraged to reach out as needed  Patient Self Care Activities:  . Performs ADL's independently . Performs IADL's independently  Please see past updates related to this goal by clicking on the "Past Updates" button in the selected goal         Patient verbalizes understanding of instructions provided today.   Follow-up Plan The care management team will reach out to the patient again over the next 45 days.   Chong Sicilian, BSN, RN-BC Embedded Chronic Care Manager [facility] / [place] Management Direct Dial: [phone]

## 2020-05-24 ENCOUNTER — Telehealth: Payer: Medicare HMO

## 2020-06-15 ENCOUNTER — Other Ambulatory Visit: Payer: Self-pay | Admitting: Family

## 2020-06-19 ENCOUNTER — Ambulatory Visit: Payer: Medicare HMO | Admitting: *Deleted

## 2020-06-19 DIAGNOSIS — E039 Hypothyroidism, unspecified: Secondary | ICD-10-CM

## 2020-06-19 DIAGNOSIS — F411 Generalized anxiety disorder: Secondary | ICD-10-CM

## 2020-06-19 DIAGNOSIS — I10 Essential (primary) hypertension: Secondary | ICD-10-CM

## 2020-06-19 NOTE — Chronic Care Management (AMB) (Addendum)
Chronic Care Management   Follow Up Note   06/19/2020 Name: Susan Contreras MRN: [id number] DOB: 04/03/1941  Referred by: [name], FNP Reason for referral : Chronic Care Management (RN Follow up)   Susan Contreras is a 80 y.o. year old female who is a primary care patient of [name], FNP. The CCM team was consulted for assistance with chronic disease management and care coordination needs.    Review of patient status, including review of consultants reports, relevant laboratory and other test results, and collaboration with appropriate care team members and the patient's provider was performed as part of comprehensive patient evaluation and provision of chronic care management services.    SDOH (Social Determinants of Health) assessments performed: No See Care Plan activities for detailed interventions related to [facility])      Outpatient Encounter Medications as of 06/19/2020  Medication Sig   ALPRAZolam (XANAX) 0.25 MG tablet TAKE 1 TABLET TWICE DAILY AS NEEDED FOR ANXIETY   atorvastatin (LIPITOR) 20 MG tablet Take 1 tablet (20 mg total) by mouth daily.   Chlorphen-PE-Acetaminophen (NOREL AD PO) Take 1 tablet by mouth as needed.    Cholecalciferol (VITAMIN D3) 125 MCG (5000 UT) CAPS Take 1 capsule (5,000 Units total) by mouth daily.   fluticasone (FLONASE) 50 MCG/ACT nasal spray USE 1 SPRAY IN EACH NOSTRIL ONCE DAILY.   irbesartan (AVAPRO) 75 MG tablet TAKE 1 TABLET DAILY   levothyroxine (SYNTHROID) 50 MCG tablet Take 1 tablet (50 mcg total) by mouth daily.   meclizine (ANTIVERT) 25 MG tablet TAKE ONE TABLET BY MOUTH THREE TIMES DAILY AS NEEDED FOR DIZZINESS (Patient taking differently: Take 25 mg by mouth 3 (three) times daily as needed for dizziness. )   pantoprazole (PROTONIX) 20 MG tablet TAKE (1) TABLET TWICE A DAY BEFORE MEALS.   No facility-administered encounter medications on file as of 06/19/2020.     Goals Addressed               This Visit's Progress      Patient Stated     "I need help managing my anxiety/stress" (pt-stated)        CARE PLAN ENTRY (see longitudinal plan of care for additional care plan information)  Anxiety in a patient with HTN, GAD, GERD, HLD, Hypothyroidism  Current Barriers:  Care Coordination needs related to anxiety and caregiver strain in a patient with HTN, hypothyroidism, depression, and anxiety  Nurse Case Manager Clinical Goal(s):  Over the next 90 days, patient will continue to talk with CCM team regarding stress and anxiety management  Interventions:  Inter-disciplinary care team collaboration (see longitudinal plan of care) Chart reviewed including recent office notes and lab results Spoke with patient by telephone regarding stress and anxiety related to husband's medical conditions.  Reviewed medications Encouraged patient to talk with LCSW regarding psychosocial issues Patient previously provided with CCM contact information and encouraged to reach out as needed  Patient Self Care Activities:  Performs ADL's independently Performs IADL's independently  Please see past updates related to this goal by clicking on the "Past Updates" button in the selected goal           Plan:   The care management team will reach out to the patient again over the next 60 days.    Chong Sicilian, BSN, RN-BC Embedded Chronic Care Manager [facility] / [place] Management Direct Dial: [phone]    I have reviewed the CCM documentation and agree with the written assessment and  plan of care.  [name], FNP

## 2020-06-19 NOTE — Patient Instructions (Signed)
Visit Information  Goals Addressed              This Visit's Progress     Patient Stated   .  "I need help managing my anxiety/stress" (pt-stated)        CARE PLAN ENTRY (see longitudinal plan of care for additional care plan information)  Anxiety in a patient with HTN, GAD, GERD, HLD, Hypothyroidism  Current Barriers:  . Care Coordination needs related to anxiety and caregiver strain in a patient with HTN, hypothyroidism, depression, and anxiety  Nurse Case Manager Clinical Goal(s):  Marland Kitchen Over the next 90 days, patient will continue to talk with CCM team regarding stress and anxiety management  Interventions:  . Inter-disciplinary care team collaboration (see longitudinal plan of care) . Chart reviewed including recent office notes and lab results . Spoke with patient by telephone regarding stress and anxiety related to husband's medical conditions.  . Reviewed medications . Encouraged patient to talk with LCSW regarding psychosocial issues . Patient previously provided with CCM contact information and encouraged to reach out as needed  Patient Self Care Activities:  . Performs ADL's independently . Performs IADL's independently  Please see past updates related to this goal by clicking on the "Past Updates" button in the selected goal         Patient verbalizes understanding of instructions provided today.   Follow-up Plan The care management team will reach out to the patient again over the next 60 days.   Chong Sicilian, BSN, RN-BC Embedded Chronic Care Manager [facility] / [place] Management Direct Dial: [phone]

## 2020-06-22 ENCOUNTER — Ambulatory Visit: Payer: Medicare HMO | Admitting: Licensed Clinical Social Worker

## 2020-06-22 ENCOUNTER — Telehealth: Payer: Medicare HMO

## 2020-06-22 DIAGNOSIS — I10 Essential (primary) hypertension: Secondary | ICD-10-CM

## 2020-06-22 DIAGNOSIS — F320 Major depressive disorder, single episode, mild: Secondary | ICD-10-CM

## 2020-06-22 DIAGNOSIS — F411 Generalized anxiety disorder: Secondary | ICD-10-CM

## 2020-06-22 DIAGNOSIS — E039 Hypothyroidism, unspecified: Secondary | ICD-10-CM

## 2020-06-22 NOTE — Patient Instructions (Addendum)
Licensed Clinical Social Worker Visit Information  Goals we discussed today:        Client will talk with LCSW in next 30 days to discuss anxiety /stress issues of concern for patinet at this time (pt-stated)       CARE PLAN ENTRY   Current Barriers:   Mental health issues of concern (anxiety/stress) in client with Chronic Diagnoses of HTN, GAD, GERD, and HLD  Decreased energy  Transportation challenges.  Clinical Social Work Clinical Goal(s):   LCSW will talk with client in next 30 days to discuss client managenet of anxiety or stress issues.   Interventions:  Talked with client about CCM program support services  Talked with Niema about anxiety or stress issues she faces.     Talked with Aashka about transport needs of client  Talked with Hadessah about her appetite  Talked with client about sleeping challenges  Talked with client about health needs of her spouse  Talked with client about mood of client  Talked with client about client management of anxiety or stress issues of client  Talked with client about her past phone conversation with Dr. Modesta Messing, psychiatrist  Talked with client about vision of client (wears glasses)  Talked with client about relaxation techniques of client (reads, listens to music, walks outdoors, dos crossword puzzles  Talked with client about upcoming client appointments  Talked with client about food assistance application through [place]  Patient Self Care Activities:  Takes medications as prescribed Attends scheduled medical appointments  Patient SELF CARE DEFICITS: Transportation challenges Sleeping challenges Mental health issues    Initial goal documentation      Follow Up Plan: LCSW will call client in next 4 weeks to discuss client management of anxiety or stress issues  Materials Provided: No  The patient verbalized understanding of instructions provided today and declined a print copy  of patient instruction materials.   Norva Riffle.Cote Mayabb MSW, LCSW Licensed Clinical Social Worker [facility] Care Management [phone]

## 2020-06-22 NOTE — Chronic Care Management (AMB) (Addendum)
Chronic Care Management    Clinical Social Work Follow Up Note  06/22/2020 Name: Susan Contreras MRN: [id number] DOB: 1941/04/19  Susan Contreras is a 79 y.o. year old female who is a primary care patient of Susan Balloon, FNP. The CCM team was consulted for assistance with Susan Contreras .   Review of patient status, including review of consultants reports, other relevant assessments, and collaboration with appropriate care team members and the patient's provider was performed as part of comprehensive patient evaluation and provision of chronic care management services.    SDOH (Social Determinants of Health) assessments performed: No;risk for tobacco use; risk for depression; risk for stress; risk for physical inactivity; risk for financial strain    Office Visit from 04/13/2020 in [facility]  PHQ-9 Total Score 3         GAD 7 : Generalized Anxiety Score 04/13/2020 01/20/2020 11/08/2019 11/02/2019  Nervous, Anxious, on Edge 0 0 1 1  Control/stop worrying 0 0 1 0  Worry too much - different things 0 0 1 0  Trouble relaxing 0 0 1 0  Restless 0 0 0 0  Easily annoyed or irritable 1 0 1 0  Afraid - awful might happen 0 0 0 0  Total GAD 7 Score 1 0 5 1  Anxiety Difficulty Not difficult at all Not difficult at all Somewhat difficult -    Outpatient Encounter Medications as of 06/22/2020  Medication Sig   ALPRAZolam (XANAX) 0.25 MG tablet TAKE 1 TABLET TWICE DAILY AS NEEDED FOR ANXIETY   atorvastatin (LIPITOR) 20 MG tablet Take 1 tablet (20 mg total) by mouth daily.   Chlorphen-PE-Acetaminophen (NOREL AD PO) Take 1 tablet by mouth as needed.    Cholecalciferol (VITAMIN D3) 125 MCG (5000 UT) CAPS Take 1 capsule (5,000 Units total) by mouth daily.   fluticasone (FLONASE) 50 MCG/ACT nasal spray USE 1 SPRAY IN EACH NOSTRIL ONCE DAILY.   irbesartan (AVAPRO) 75 MG tablet TAKE 1 TABLET DAILY   levothyroxine (SYNTHROID) 50 MCG tablet Take 1 tablet (50 mcg total) by mouth  daily.   meclizine (ANTIVERT) 25 MG tablet TAKE ONE TABLET BY MOUTH THREE TIMES DAILY AS NEEDED FOR DIZZINESS (Patient taking differently: Take 25 mg by mouth 3 (three) times daily as needed for dizziness. )   pantoprazole (PROTONIX) 20 MG tablet TAKE (1) TABLET TWICE A DAY BEFORE MEALS.   No facility-administered encounter medications on file as of 06/22/2020.      Goals       Client will talk with LCSW in next 30 days to discuss anxiety /stress issues of concern for patinet at this time (pt-stated)      CARE PLAN ENTRY   Current Barriers:  Mental health issues of concern (anxiety/stress) in client with Chronic Diagnoses of HTN, GAD, GERD, and HLD Decreased energy Transportation challenges.  Clinical Social Work Clinical Goal(s):  LCSW will talk with client in next 30 days to discuss client managenet of anxiety or stress issues.   Interventions: Talked with client about CCM program support services Talked with [name] about anxiety or stress issues she faces.    Talked with [name] about transport needs of client Talked with [name] about her appetite Talked with client about sleeping challenges Talked with client about health needs of her spouse Talked with client about mood of client Talked with client about client management of anxiety or stress issues of client Talked with client about her past phone conversation with Dr. Modesta Messing, psychiatrist Talked  with client about vision of client (wears glasses) Talked with client about relaxation techniques of client (reads, listens to music, walks outdoors, dos crossword puzzles Talked with client about upcoming client appointments Talked with client about food assistance application through [place]  Patient Self Care Activities:  Takes medications as prescribed Attends scheduled medical appointments  Patient SELF CARE DEFICITS: Transportation challenges Sleeping challenges Mental health issues    Initial goal  documentation      Follow Up Plan: LCSW will call client in next 4 weeks to discuss client management of anxiety or stress issues   Norva Riffle.[name] MSW, LCSW Licensed Clinical Social Worker [facility] Care Management [phone]  I have reviewed the CCM documentation and agree with the written assessment and plan of care.  [name], FNP

## 2020-06-27 ENCOUNTER — Ambulatory Visit (INDEPENDENT_AMBULATORY_CARE_PROVIDER_SITE_OTHER): Payer: Medicare HMO | Admitting: Family Medicine

## 2020-06-27 ENCOUNTER — Encounter: Payer: Self-pay | Admitting: Family Medicine

## 2020-06-27 DIAGNOSIS — J0100 Acute maxillary sinusitis, unspecified: Secondary | ICD-10-CM

## 2020-06-27 MED ORDER — AMOXICILLIN 500 MG PO CAPS: 500.0000 mg | ORAL_CAPSULE | Freq: Three times a day (TID) | ORAL | 0 refills | Status: AC

## 2020-06-27 NOTE — Progress Notes (Signed)
Subjective:    Patient ID: Susan Contreras, female    DOB: [birth year], 79 y.o.   MRN: [id number]   HPI: Susan Contreras is a 79 y.o. female presenting for drainage & cough. Worse at night and first thing in the morning. Onset 3 wks ago off and on. Nose red. Throat scratchy. No fever. Clear Rhino. Coughing yellow with posterior drainage. Feeling draggy. Usually occurs this time of year. Had 3 amoxicillin 4 days ago & felt better for a while.     Depression screen [facility] 2/9 05/03/2020 04/13/2020 01/20/2020 11/08/2019 11/02/2019  Decreased Interest 0 1 0 1 1  Down, Depressed, Hopeless 0 0 0 1 -  PHQ - 2 Score 0 1 0 2 1  Altered sleeping - 1 - 1 1  Tired, decreased energy - 1 - 2 1  Change in appetite - 0 - 1 0  Feeling bad or failure about yourself  - 0 - 0 0  Trouble concentrating - 0 - 1 0  Moving slowly or fidgety/restless - 0 - 1 1  Suicidal thoughts - 0 - 0 1  PHQ-9 Score - 3 - 8 5  Difficult doing work/chores - Not difficult at all - Somewhat difficult -  Some recent data might be hidden     Relevant past medical, surgical, family and social history reviewed and updated as indicated.  Interim medical history since our last visit reviewed. Allergies and medications reviewed and updated.  ROS:  Review of Systems  Constitutional: Negative for activity change, appetite change, chills and fever.  HENT: Positive for congestion, postnasal drip, rhinorrhea and sinus pressure. Negative for ear discharge, ear pain, hearing loss, nosebleeds, sneezing and trouble swallowing.   Respiratory: Negative for chest tightness and shortness of breath.   Cardiovascular: Negative for chest pain and palpitations.  Skin: Negative for rash.     Social History   Tobacco Use  Smoking Status Never Smoker  Smokeless Tobacco Never Used       Objective:     Wt Readings from Last 3 Encounters:  05/03/20 157 lb 12.8 oz (71.6 kg)  04/27/20 155 lb 9.6 oz (70.6 kg)  04/26/20 156 lb 3.2 oz (70.9 kg)      Exam deferred. Pt. [place] due to COVID 19. Phone visit performed.   Assessment & Plan:   1. Acute maxillary sinusitis, recurrence not specified     Meds ordered this encounter  Medications  . amoxicillin (AMOXIL) 500 MG capsule    Sig: Take 1 capsule (500 mg total) by mouth 3 (three) times daily for 10 days.    Dispense:  30 capsule    Refill:  0    No orders of the defined types were placed in this encounter.     Diagnoses and all orders for this visit:  Acute maxillary sinusitis, recurrence not specified  Other orders -     amoxicillin (AMOXIL) 500 MG capsule; Take 1 capsule (500 mg total) by mouth 3 (three) times daily for 10 days.    Virtual Visit via telephone Note  I discussed the limitations, risks, security and privacy concerns of performing an evaluation and management service by telephone and the availability of in person appointments. The patient was identified with two identifiers. Pt.expressed understanding and agreed to proceed. Pt. Is at home. Dr. Livia Snellen is in his office.  Follow Up Instructions:   I discussed the assessment and treatment plan with the patient. The patient was provided an opportunity to ask questions and  all were answered. The patient agreed with the plan and demonstrated an understanding of the instructions.   The patient was advised to call back or seek an in-person evaluation if the symptoms worsen or if the condition fails to improve as anticipated.   Total minutes including chart review and phone contact time: 12   Follow up plan: Return if symptoms worsen or fail to improve.  Claretta Fraise, MD [facility]

## 2020-07-24 ENCOUNTER — Ambulatory Visit: Payer: Medicare HMO

## 2020-07-25 ENCOUNTER — Ambulatory Visit: Payer: Medicare HMO | Admitting: Licensed Clinical Social Worker

## 2020-07-25 DIAGNOSIS — K2100 Gastro-esophageal reflux disease with esophagitis, without bleeding: Secondary | ICD-10-CM

## 2020-07-25 DIAGNOSIS — E785 Hyperlipidemia, unspecified: Secondary | ICD-10-CM

## 2020-07-25 DIAGNOSIS — F411 Generalized anxiety disorder: Secondary | ICD-10-CM

## 2020-07-25 DIAGNOSIS — I10 Essential (primary) hypertension: Secondary | ICD-10-CM

## 2020-07-25 NOTE — Chronic Care Management (AMB) (Signed)
Chronic Care Management    Clinical Social Work Follow Up Note  07/25/2020 Name: Susan Contreras MRN: [id number] DOB: [birth year]  Susan Contreras is a 79 y.o. year old female who is a primary care patient of Susan Balloon, FNP. The CCM team was consulted for assistance with Susan Contreras .   Review of patient status, including review of consultants reports, other relevant assessments, and collaboration with appropriate care team members and the patient's provider was performed as part of comprehensive patient evaluation and provision of chronic care management services.    SDOH (Social Determinants of Health) assessments performed: No;risk for depression; risk for tobacco use; risk for stress; risk for transport needs    Office Visit from 04/13/2020 in [facility]  PHQ-9 Total Score 3     GAD 7 : Generalized Anxiety Score 04/13/2020 01/20/2020 11/08/2019 11/02/2019  Nervous, Anxious, on Edge 0 0 1 1  Control/stop worrying 0 0 1 0  Worry too much - different things 0 0 1 0  Trouble relaxing 0 0 1 0  Restless 0 0 0 0  Easily annoyed or irritable 1 0 1 0  Afraid - awful might happen 0 0 0 0  Total GAD 7 Score 1 0 5 1  Anxiety Difficulty Not difficult at all Not difficult at all Somewhat difficult -    Outpatient Encounter Medications as of 07/25/2020  Medication Sig  . ALPRAZolam (XANAX) 0.25 MG tablet TAKE 1 TABLET TWICE DAILY AS NEEDED FOR ANXIETY  . atorvastatin (LIPITOR) 20 MG tablet Take 1 tablet (20 mg total) by mouth daily.  . Chlorphen-PE-Acetaminophen (NOREL AD PO) Take 1 tablet by mouth as needed.   . Cholecalciferol (VITAMIN D3) 125 MCG (5000 UT) CAPS Take 1 capsule (5,000 Units total) by mouth daily.  . fluticasone (FLONASE) 50 MCG/ACT nasal spray USE 1 SPRAY IN EACH NOSTRIL ONCE DAILY.  Marland Kitchen irbesartan (AVAPRO) 75 MG tablet TAKE 1 TABLET DAILY  . levothyroxine (SYNTHROID) 50 MCG tablet Take 1 tablet (50 mcg total) by mouth daily.  . meclizine (ANTIVERT)  25 MG tablet TAKE ONE TABLET BY MOUTH THREE TIMES DAILY AS NEEDED FOR DIZZINESS (Patient taking differently: Take 25 mg by mouth 3 (three) times daily as needed for dizziness. )  . pantoprazole (PROTONIX) 20 MG tablet TAKE (1) TABLET TWICE A DAY BEFORE MEALS.   No facility-administered encounter medications on file as of 07/25/2020.    Goals    .  Client will talk with LCSW in next 30 days to discuss anxiety /stress issues of concern for patinet at this time (pt-stated)      [place] (see longtitudinal plan of care for additional care plan information)  Current Barriers:  Marland Kitchen Mental health issues of concern (anxiety/stress) in client with Chronic Diagnoses of HTN, GAD, GERD, and HLD . Decreased energy . Transportation challenges.  Clinical Social Work Clinical Goal(s):  Marland Kitchen LCSW will talk with client in next 30 days to discuss client managenet of anxiety or stress issues.   Interventions: . Talked with client about CCM program support services . Talked with Susan Contreras about anxiety or stress issues she faces.   .  Talked with Susan Contreras about transport needs of client . Talked with client about sleeping challenges . Talked with Susan Contreras about her last visit with Susan Contreras . Talked with client about her use of prescribed antibiotic (she said that use of antibiotic has been helpful) . Talked with client about ambulation challenges . Talked with client about  health needs of her spouse . Talked with client about vision of client (she wears glasses) . Provided counseling support for client . Talked with client  about Food Stamps application for client through Susan Contreras  Patient Self Care Activities:  Takes medications as prescribed Attends scheduled medical appointments  Patient SELF CARE DEFICITS: Transportation challenges Sleeping challenges Mental health issues    Initial goal documentation     Follow Up Plan: LCSW will call client in next 4 weeks to  discuss client management of anxiety or stress issues  Susan Contreras.Susan Contreras MSW, LCSW Licensed Clinical Social Worker [facility] Care Management [phone]

## 2020-07-25 NOTE — Patient Instructions (Addendum)
Licensed Clinical Social Worker Visit Information  Goals we discussed today:     Client will talk with LCSW in next 30 days to discuss anxiety /stress issues of concern for patinet at this time (pt-stated)        [place] (see longtitudinal plan of care for additional care plan information)  Current Barriers:   Mental health issues of concern (anxiety/stress) in client with Chronic Diagnoses of HTN, GAD, GERD, and HLD  Decreased energy  Transportation challenges.  Clinical Social Work Clinical Goal(s):   LCSW will talk with client in next 30 days to discuss client managenet of anxiety or stress issues.   Interventions:  Talked with client about CCM program support services  Talked with Amorita about anxiety or stress issues she faces.     Talked with Adara about transport needs of client  Talked with client about sleeping challenges  Talked with Mohogany about her last visit with Almond Lint  Talked with client about her use of prescribed antibiotic (she said that use of antibiotic has been helpful)  Talked with client about ambulation challenges  Talked with client about health needs of her spouse  Talked with client about vision of client (she wears glasses)  Provided counseling support for client  Talked with client  about Food Stamps application for client through [employer]  Patient Self Care Activities:  Takes medications as prescribed Attends scheduled medical appointments  Patient SELF CARE DEFICITS: Transportation challenges Sleeping challenges Mental health issues    Initial goal documentation     Follow Up Plan: LCSW will call client in next 4 weeks to discuss client management of anxiety or stress issues  Materials Provided: No  The patient verbalized understanding of instructions provided today and declined a print copy of patient instruction materials.   Norva Riffle.Shawny Borkowski MSW, LCSW Licensed  Clinical Social Worker [facility] [phone]

## 2020-07-27 ENCOUNTER — Ambulatory Visit: Payer: Medicare HMO | Admitting: "Endocrinology

## 2020-08-06 ENCOUNTER — Other Ambulatory Visit: Payer: Self-pay | Admitting: Family

## 2020-08-07 ENCOUNTER — Ambulatory Visit (INDEPENDENT_AMBULATORY_CARE_PROVIDER_SITE_OTHER): Payer: Medicare HMO | Admitting: *Deleted

## 2020-08-07 ENCOUNTER — Other Ambulatory Visit: Payer: Self-pay

## 2020-08-07 DIAGNOSIS — Z23 Encounter for immunization: Secondary | ICD-10-CM

## 2020-08-07 NOTE — Progress Notes (Signed)
Pneumonia vaccine given IM in left deltoid. Patient tolerated well.

## 2020-08-08 ENCOUNTER — Telehealth: Payer: Medicare HMO | Admitting: *Deleted

## 2020-08-13 ENCOUNTER — Ambulatory Visit (INDEPENDENT_AMBULATORY_CARE_PROVIDER_SITE_OTHER): Payer: Medicare HMO | Admitting: Family

## 2020-08-13 ENCOUNTER — Other Ambulatory Visit: Payer: Self-pay

## 2020-08-13 ENCOUNTER — Encounter: Payer: Self-pay | Admitting: Family

## 2020-08-13 VITALS — BP 123/72 | HR 74 | Temp 96.5°F | Ht 61.0 in | Wt 165.4 lb

## 2020-08-13 DIAGNOSIS — K219 Gastro-esophageal reflux disease without esophagitis: Secondary | ICD-10-CM | POA: Diagnosis not present

## 2020-08-13 DIAGNOSIS — E785 Hyperlipidemia, unspecified: Secondary | ICD-10-CM | POA: Diagnosis not present

## 2020-08-13 DIAGNOSIS — E559 Vitamin D deficiency, unspecified: Secondary | ICD-10-CM

## 2020-08-13 DIAGNOSIS — Z79899 Other long term (current) drug therapy: Secondary | ICD-10-CM | POA: Diagnosis not present

## 2020-08-13 DIAGNOSIS — E039 Hypothyroidism, unspecified: Secondary | ICD-10-CM

## 2020-08-13 DIAGNOSIS — J3089 Other allergic rhinitis: Secondary | ICD-10-CM

## 2020-08-13 DIAGNOSIS — F1320 Sedative, hypnotic or anxiolytic dependence, uncomplicated: Secondary | ICD-10-CM | POA: Diagnosis not present

## 2020-08-13 DIAGNOSIS — F411 Generalized anxiety disorder: Secondary | ICD-10-CM | POA: Diagnosis not present

## 2020-08-13 DIAGNOSIS — I159 Secondary hypertension, unspecified: Secondary | ICD-10-CM

## 2020-08-13 DIAGNOSIS — E669 Obesity, unspecified: Secondary | ICD-10-CM | POA: Diagnosis not present

## 2020-08-13 MED ORDER — ALPRAZOLAM 0.25 MG PO TABS: ORAL_TABLET | ORAL | 5 refills | Status: DC

## 2020-08-13 NOTE — Patient Instructions (Signed)
Health Maintenance After Age 77 After age 77, you are at a higher risk for certain long-term diseases and infections as well as injuries from falls. Falls are a major cause of broken bones and head injuries in people who are older than age 77. Getting regular preventive care can help to keep you healthy and well. Preventive care includes getting regular testing and making lifestyle changes as recommended by your health care provider. Talk with your health care provider about:  Which screenings and tests you should have. A screening is a test that checks for a disease when you have no symptoms.  A diet and exercise plan that is right for you. What should I know about screenings and tests to prevent falls? Screening and testing are the best ways to find a health problem early. Early diagnosis and treatment give you the best chance of managing medical conditions that are common after age 77. Certain conditions and lifestyle choices may make you more likely to have a fall. Your health care provider may recommend:  Regular vision checks. Poor vision and conditions such as cataracts can make you more likely to have a fall. If you wear glasses, make sure to get your prescription updated if your vision changes.  Medicine review. Work with your health care provider to regularly review all of the medicines you are taking, including over-the-counter medicines. Ask your health care provider about any side effects that may make you more likely to have a fall. Tell your health care provider if any medicines that you take make you feel dizzy or sleepy.  Osteoporosis screening. Osteoporosis is a condition that causes the bones to get weaker. This can make the bones weak and cause them to break more easily.  Blood pressure screening. Blood pressure changes and medicines to control blood pressure can make you feel dizzy.  Strength and balance checks. Your health care provider may recommend certain tests to check your  strength and balance while standing, walking, or changing positions.  Foot health exam. Foot pain and numbness, as well as not wearing proper footwear, can make you more likely to have a fall.  Depression screening. You may be more likely to have a fall if you have a fear of falling, feel emotionally low, or feel unable to do activities that you used to do.  Alcohol use screening. Using too much alcohol can affect your balance and may make you more likely to have a fall. What actions can I take to lower my risk of falls? General instructions  Talk with your health care provider about your risks for falling. Tell your health care provider if: ? You fall. Be sure to tell your health care provider about all falls, even ones that seem minor. ? You feel dizzy, sleepy, or off-balance.  Take over-the-counter and prescription medicines only as told by your health care provider. These include any supplements.  Eat a healthy diet and maintain a healthy weight. A healthy diet includes low-fat dairy products, low-fat (lean) meats, and fiber from whole grains, beans, and lots of fruits and vegetables. Home safety  Remove any tripping hazards, such as rugs, cords, and clutter.  Install safety equipment such as grab bars in bathrooms and safety rails on stairs.  Keep rooms and walkways well-lit. Activity   Follow a regular exercise program to stay fit. This will help you maintain your balance. Ask your health care provider what types of exercise are appropriate for you.  If you need a cane or   walker, use it as recommended by your health care provider.  Wear supportive shoes that have nonskid soles. Lifestyle  Do not drink alcohol if your health care provider tells you not to drink.  If you drink alcohol, limit how much you have: ? 0-1 drink a day for women. ? 0-2 drinks a day for men.  Be aware of how much alcohol is in your drink. In the [place] one drink equals one typical bottle of beer (12  oz), one-half glass of wine (5 oz), or one shot of hard liquor (1 oz).  Do not use any products that contain nicotine or tobacco, such as cigarettes and e-cigarettes. If you need help quitting, ask your health care provider. Summary  Having a healthy lifestyle and getting preventive care can help to protect your health and wellness after age 77.  Screening and testing are the best way to find a health problem early and help you avoid having a fall. Early diagnosis and treatment give you the best chance for managing medical conditions that are more common for people who are older than age 77.  Falls are a major cause of broken bones and head injuries in people who are older than age 77. Take precautions to prevent a fall at home.  Work with your health care provider to learn what changes you can make to improve your health and wellness and to prevent falls. This information is not intended to replace advice given to you by your health care provider. Make sure you discuss any questions you have with your health care provider. Document Revised: 12/02/2018 Document Reviewed: 06/24/2017 Elsevier Patient Education  2020 [employer]  

## 2020-08-13 NOTE — Progress Notes (Signed)
Subjective:    Patient ID: Susan Contreras, female    DOB: [birth year], 79 y.o.   MRN: [id number]  Chief Complaint  Patient presents with  . Medical Management of Chronic Issues   Pt presents to the office today for chronic follow up. She is followed by GI every 6 months. She has an appt with an Endocrinologists in two weeks for the first time. She feels weak and tired and feels like this is related to her thyroid.  Hypertension This is a chronic problem. The current episode started more than 1 year ago. The problem has been resolved since onset. The problem is controlled. Associated symptoms include anxiety and malaise/fatigue. Pertinent negatives include no peripheral edema or shortness of breath. Risk factors for coronary artery disease include dyslipidemia, obesity and sedentary lifestyle. The current treatment provides moderate improvement. There is no history of heart failure. Identifiable causes of hypertension include a thyroid problem.  Gastroesophageal Reflux She complains of belching, heartburn and a hoarse voice. This is a chronic problem. The current episode started more than 1 year ago. The problem occurs occasionally. The problem has been waxing and waning. Pertinent negatives include no fatigue. She has tried a PPI for the symptoms. The treatment provided moderate relief.  Thyroid Problem Presents for follow-up visit. Symptoms include anxiety and hoarse voice. Patient reports no depressed mood or fatigue. The symptoms have been stable. Her past medical history is significant for hyperlipidemia. There is no history of heart failure.  Hyperlipidemia This is a chronic problem. The current episode started more than 1 year ago. Exacerbating diseases include obesity. Pertinent negatives include no shortness of breath. Current antihyperlipidemic treatment includes statins. The current treatment provides moderate improvement of lipids. Risk factors for coronary artery disease include  dyslipidemia, hypertension, a sedentary lifestyle and post-menopausal.  Anxiety Presents for follow-up visit. Symptoms include excessive worry, irritability and nervous/anxious behavior. Patient reports no depressed mood or shortness of breath. Symptoms occur constantly. The severity of symptoms is moderate. The quality of sleep is good.        Review of Systems  Constitutional: Positive for irritability and malaise/fatigue. Negative for fatigue.  HENT: Positive for hoarse voice.   Respiratory: Negative for shortness of breath.   Gastrointestinal: Positive for heartburn.  Psychiatric/Behavioral: The patient is nervous/anxious.   All other systems reviewed and are negative.      Objective:   Physical Exam Vitals reviewed.  Constitutional:      General: She is not in acute distress.    Appearance: She is well-developed and well-nourished.  HENT:     Head: Normocephalic and atraumatic.     Right Ear: Tympanic membrane normal.     Left Ear: Tympanic membrane normal.     Mouth/Throat:     Mouth: Oropharynx is clear and moist.  Eyes:     Pupils: Pupils are equal, round, and reactive to light.  Neck:     Thyroid: No thyromegaly.  Cardiovascular:     Rate and Rhythm: Normal rate and regular rhythm.     Pulses: Intact distal pulses.     Heart sounds: Normal heart sounds. No murmur heard.   Pulmonary:     Effort: Pulmonary effort is normal. No respiratory distress.     Breath sounds: Normal breath sounds. No wheezing.  Abdominal:     General: Bowel sounds are normal. There is no distension.     Palpations: Abdomen is soft.     Tenderness: There is no abdominal tenderness.  Musculoskeletal:  General: No tenderness or edema. Normal range of motion.     Cervical back: Normal range of motion and neck supple.  Skin:    General: Skin is warm and dry.  Neurological:     Mental Status: She is alert and oriented to person, place, and time.     Cranial Nerves: No cranial  nerve deficit.     Deep Tendon Reflexes: Reflexes are normal and symmetric.  Psychiatric:        Mood and Affect: Mood and affect normal.        Behavior: Behavior normal.        Thought Content: Thought content normal.        Judgment: Judgment normal.       BP 123/72   Pulse 74   Temp (!) 96.5 F (35.8 C) (Temporal)   Ht 5' 1"  (1.549 m)   Wt 165 lb 6.4 oz (75 kg)   BMI 31.25 kg/m      Assessment & Plan:  Susan Contreras comes in today with chief complaint of Medical Management of Chronic Issues   Diagnosis and orders addressed:  1. GAD (generalized anxiety disorder) - ALPRAZolam (XANAX) 0.25 MG tablet; TAKE 1 TABLET TWICE DAILY AS NEEDED FOR ANXIETY  Dispense: 60 tablet; Refill: 5 - CMP14+EGFR - CBC with Differential/Platelet  2. Controlled substance agreement signed - ALPRAZolam (XANAX) 0.25 MG tablet; TAKE 1 TABLET TWICE DAILY AS NEEDED FOR ANXIETY  Dispense: 60 tablet; Refill: 5 - CMP14+EGFR - CBC with Differential/Platelet  3. Benzodiazepine dependence (HCC) - ALPRAZolam (XANAX) 0.25 MG tablet; TAKE 1 TABLET TWICE DAILY AS NEEDED FOR ANXIETY  Dispense: 60 tablet; Refill: 5 - CMP14+EGFR - CBC with Differential/Platelet  4. Secondary hypertension  - CMP14+EGFR - CBC with Differential/Platelet  5. Allergic rhinitis due to other allergic trigger, unspecified seasonality - CMP14+EGFR - CBC with Differential/Platelet  6. Gastroesophageal reflux disease, unspecified whether esophagitis present - CMP14+EGFR - CBC with Differential/Platelet  7. Hypothyroidism, unspecified type - CMP14+EGFR - CBC with Differential/Platelet  8. Obesity (BMI 30.0-34.9) - CMP14+EGFR - CBC with Differential/Platelet  9. Hyperlipidemia, unspecified hyperlipidemia type - CMP14+EGFR - CBC with Differential/Platelet  10. Vitamin D deficiency - CMP14+EGFR - CBC with Differential/Platelet   Labs pending Patient reviewed in [place] controlled database, no flags noted. Contract and  drug screen are up to date.  Health Maintenance reviewed Diet and exercise encouraged  Follow up plan: 6 months    [name], FNP

## 2020-08-14 LAB — CMP14+EGFR
ALT: 10 IU/L (ref 0–32)
AST: 16 IU/L (ref 0–40)
Albumin/Globulin Ratio: 1.7 (ref 1.2–2.2)
Albumin: 4.9 g/dL — ABNORMAL HIGH (ref 3.7–4.7)
Alkaline Phosphatase: 84 IU/L (ref 44–121)
BUN/Creatinine Ratio: 8 — ABNORMAL LOW (ref 12–28)
BUN: 11 mg/dL (ref 8–27)
Bilirubin Total: 0.6 mg/dL (ref 0.0–1.2)
CO2: 24 mmol/L (ref 20–29)
Calcium: 9.6 mg/dL (ref 8.7–10.3)
Chloride: 97 mmol/L (ref 96–106)
Creatinine, Ser: 1.37 mg/dL — ABNORMAL HIGH (ref 0.57–1.00)
GFR calc Af Amer: 43 mL/min/{1.73_m2} — ABNORMAL LOW (ref >59)
GFR calc non Af Amer: 37 mL/min/{1.73_m2} — ABNORMAL LOW (ref >59)
Globulin, Total: 2.9 g/dL (ref 1.5–4.5)
Glucose: 90 mg/dL (ref 65–99)
Potassium: 4.3 mmol/L (ref 3.5–5.2)
Sodium: 135 mmol/L (ref 134–144)
Total Protein: 7.8 g/dL (ref 6.0–8.5)

## 2020-08-14 LAB — CBC WITH DIFFERENTIAL/PLATELET
Basophils Absolute: 0.1 10*3/uL (ref 0.0–0.2)
Basos: 1 %
EOS (ABSOLUTE): 0.5 10*3/uL — ABNORMAL HIGH (ref 0.0–0.4)
Eos: 5 %
Hematocrit: 38.9 % (ref 34.0–46.6)
Hemoglobin: 13 g/dL (ref 11.1–15.9)
Immature Grans (Abs): 0 10*3/uL (ref 0.0–0.1)
Immature Granulocytes: 0 %
Lymphocytes Absolute: 3.7 10*3/uL — ABNORMAL HIGH (ref 0.7–3.1)
Lymphs: 38 %
MCH: 30 pg (ref 26.6–33.0)
MCHC: 33.4 g/dL (ref 31.5–35.7)
MCV: 90 fL (ref 79–97)
Monocytes Absolute: 0.8 10*3/uL (ref 0.1–0.9)
Monocytes: 8 %
Neutrophils Absolute: 4.7 10*3/uL (ref 1.4–7.0)
Neutrophils: 48 %
Platelets: 282 10*3/uL (ref 150–450)
RBC: 4.33 x10E6/uL (ref 3.77–5.28)
RDW: 12.8 % (ref 11.7–15.4)
WBC: 9.8 10*3/uL (ref 3.4–10.8)

## 2020-08-31 ENCOUNTER — Ambulatory Visit: Payer: Medicare HMO | Admitting: Licensed Clinical Social Worker

## 2020-08-31 DIAGNOSIS — F411 Generalized anxiety disorder: Secondary | ICD-10-CM

## 2020-08-31 DIAGNOSIS — E785 Hyperlipidemia, unspecified: Secondary | ICD-10-CM

## 2020-08-31 DIAGNOSIS — K219 Gastro-esophageal reflux disease without esophagitis: Secondary | ICD-10-CM

## 2020-08-31 DIAGNOSIS — I159 Secondary hypertension, unspecified: Secondary | ICD-10-CM

## 2020-08-31 NOTE — Chronic Care Management (AMB) (Signed)
Chronic Care Management    Clinical Social Work Follow Up Note  08/31/2020 Name: Raevyn Sokol MRN: [id number] DOB: 10/26/1940  Oneida Mckamey is a 80 y.o. year old female who is a primary care patient of [name], FNP. The CCM team was consulted for assistance with [name] .   Review of patient status, including review of consultants reports, other relevant assessments, and collaboration with appropriate care team members and the patient's provider was performed as part of comprehensive patient evaluation and provision of chronic care management services.    SDOH (Social Determinants of Health) assessments performed: No; risk for depression; risk for tobacco use; risk for stress; risk for transport needs  [place] Office Visit from 08/13/2020 in [place]  PHQ-9 Total Score 3      GAD 7 : Generalized Anxiety Score 04/13/2020 01/20/2020 11/08/2019 11/02/2019  Nervous, Anxious, on Edge 0 0 1 1  Control/stop worrying 0 0 1 0  Worry too much - different things 0 0 1 0  Trouble relaxing 0 0 1 0  Restless 0 0 0 0  Easily annoyed or irritable 1 0 1 0  Afraid - awful might happen 0 0 0 0  Total GAD 7 Score 1 0 5 1  Anxiety Difficulty Not difficult at all Not difficult at all Somewhat difficult -    Outpatient Encounter Medications as of 08/31/2020  Medication Sig  . ALPRAZolam (XANAX) 0.25 MG tablet TAKE 1 TABLET TWICE DAILY AS NEEDED FOR ANXIETY  . atorvastatin (LIPITOR) 20 MG tablet TAKE 1 TABLET DAILY  . Chlorphen-PE-Acetaminophen (NOREL AD PO) Take 1 tablet by mouth as needed.   . Cholecalciferol (VITAMIN D3) 125 MCG (5000 UT) CAPS Take 1 capsule (5,000 Units total) by mouth daily.  . fluticasone (FLONASE) 50 MCG/ACT nasal spray USE 1 SPRAY IN EACH NOSTRIL ONCE DAILY.  Marland Kitchen irbesartan (AVAPRO) 75 MG tablet TAKE 1 TABLET DAILY  . levothyroxine (SYNTHROID) 50 MCG tablet Take 1 tablet (50 mcg total) by mouth daily.  . meclizine (ANTIVERT) 25 MG  tablet TAKE ONE TABLET BY MOUTH THREE TIMES DAILY AS NEEDED FOR DIZZINESS  . pantoprazole (PROTONIX) 20 MG tablet TAKE (1) TABLET TWICE A DAY BEFORE MEALS.   No facility-administered encounter medications on file as of 08/31/2020.    Goals    .  Client will talk with LCSW in next 30 days to discuss anxiety /stress issues of concern for patinet at this time (pt-stated)      [place] (see longtitudinal plan of care for additional care plan information)  Current Barriers:  Marland Kitchen Mental health issues of concern (anxiety/stress) in client with Chronic Diagnoses of HTN, GAD, GERD, and HLD . Decreased energy . Transportation challenges.  Clinical Social Work Clinical Goal(s):  Marland Kitchen LCSW will talk with client in next 30 days to discuss client managenet of anxiety or stress issues.   Interventions:  Talked with client about CCM program support services  Talked with Kaydie about anxiety or stress issues she faces.     Talked with Zeena about transport needs of client  Talked with client about sleeping challenges  Talked with client about ambulation challenges  Talked with client about health needs of her spouse  Provided counseling support for client  Talked with client  about Food Stamps application for client through [place]  Talked with client about energy level of client  Talked with client about relaxation techniques (likes to watch TV, listens to music,  likes to do word search puzzles)  Talked with client about vision of client (she said she is on waitingl list for eye appointment)  Talked with client about [employer] [facility] [employer] [facility] as a resource  Talked with client about upcoming client appointments  Encouraged client to call RNCM as needed for nursing support . Talked with client about mood of client  Self Care Activities:  Takes medications as prescribed Attends scheduled medical appointments  Patient SELF CARE DEFICITS: Transportation  challenges Sleeping challenges Mental health issues   Initial goal documentation    Follow Up Plan:  LCSW will call client in next 4 weeks to discuss client management of anxiety or stress issues  Norva Riffle.Achaia Garlock MSW, LCSW Licensed Clinical Social Worker [facility] Care Management [phone]

## 2020-08-31 NOTE — Patient Instructions (Addendum)
Licensed Clinical Social Worker Visit Information  Goals we discussed today:  .  Client will talk with LCSW in next 30 days to discuss anxiety /stress issues of concern for patinet at this time (pt-stated)        [place] (see longtitudinal plan of care for additional care plan information)  Current Barriers:   Mental health issues of concern (anxiety/stress) in client with Chronic Diagnoses of HTN, GAD, GERD, and HLD  Decreased energy  Transportation challenges.  Clinical Social Work Clinical Goal(s):   LCSW will talk with client in next 30 days to discuss client managenet of anxiety or stress issues.   Interventions:  Talked with client about CCM program support services  Talked with [place] about anxiety or stress issues she faces.   Talked with Cande about transport needs of client  Talked with client about sleeping challenges  Talked with client about ambulation challenges  Talked with client about health needs of her spouse  Provided counseling support for client  Talked with client about Food Stamps application for client through [place]  Talked with client about energy level of client  Talked with client about relaxation techniques (likes to watch TV, listens to music, likes to do word search puzzles)  Talked with client about vision of client (she said she is on waitingl list for eye appointment)  Talked with client about [employer] food [facility] as a resource  Talked with client about upcoming client appointments  Encouraged client to call RNCM as needed for nursing support  Talked with client about mood of client  Self Care Activities:  Takes medications as prescribed Attends scheduled medical appointments  Patient SELF CARE DEFICITS: Transportation challenges Sleeping challenges Mental health issues   Initial goal documentation    Follow Up Plan:  LCSW will call client in next 4 weeks to discuss  client management of anxiety or stress issues  Materials Provided: No  The patient verbalized understanding of instructions provided today and declined a print copy of patient instruction materials.   Norva Riffle.Janese Radabaugh MSW, LCSW Licensed Clinical Social Worker [facility] Care Management [phone]

## 2020-09-26 ENCOUNTER — Encounter: Payer: Self-pay | Admitting: Family Medicine

## 2020-09-26 ENCOUNTER — Ambulatory Visit (INDEPENDENT_AMBULATORY_CARE_PROVIDER_SITE_OTHER): Payer: Medicare HMO | Admitting: Family Medicine

## 2020-09-26 DIAGNOSIS — J019 Acute sinusitis, unspecified: Secondary | ICD-10-CM | POA: Diagnosis not present

## 2020-09-26 MED ORDER — AMOXICILLIN 500 MG PO CAPS: 500.0000 mg | ORAL_CAPSULE | Freq: Two times a day (BID) | ORAL | 0 refills | Status: DC

## 2020-09-26 NOTE — Progress Notes (Signed)
Virtual Visit via telephone Note  I connected with Susan Contreras on 09/26/20 at 1157 by telephone and verified that I am speaking with the correct person using two identifiers. Susan Contreras is currently located at home and patient are currently with her during visit. The provider, Fransisca Kaufmann Saphyra Hutt, MD is located in their office at time of visit.  Call ended at 1208  I discussed the limitations, risks, security and privacy concerns of performing an evaluation and management service by telephone and the availability of in person appointments. I also discussed with the patient that there may be a patient responsible charge related to this service. The patient expressed understanding and agreed to proceed.   History and Present Illness: Patient is calling in with headaches and sinus pressure and drainage over and behind eyes.  She feels pressure in her ears.  She feels slightly better this am.  She uses norel and zyrtec.  She is coughing and sneezing and congestion.  She denies shortness of breath and wheezing. She denies sick contacts.  She started about 1 week ago but worsened on Sunday 4 days ago.  She is using flonase at bedtime. She denies loss of taste or smell.  She is covid vaccinated but not flu vaccinated.   No diagnosis found.  Outpatient Encounter Medications as of 09/26/2020  Medication Sig  . ALPRAZolam (XANAX) 0.25 MG tablet TAKE 1 TABLET TWICE DAILY AS NEEDED FOR ANXIETY  . atorvastatin (LIPITOR) 20 MG tablet TAKE 1 TABLET DAILY  . Chlorphen-PE-Acetaminophen (NOREL AD PO) Take 1 tablet by mouth as needed.   . Cholecalciferol (VITAMIN D3) 125 MCG (5000 UT) CAPS Take 1 capsule (5,000 Units total) by mouth daily.  . fluticasone (FLONASE) 50 MCG/ACT nasal spray USE 1 SPRAY IN EACH NOSTRIL ONCE DAILY.  Marland Kitchen irbesartan (AVAPRO) 75 MG tablet TAKE 1 TABLET DAILY  . levothyroxine (SYNTHROID) 50 MCG tablet Take 1 tablet (50 mcg total) by mouth daily.  . meclizine (ANTIVERT) 25 MG tablet TAKE  ONE TABLET BY MOUTH THREE TIMES DAILY AS NEEDED FOR DIZZINESS  . pantoprazole (PROTONIX) 20 MG tablet TAKE (1) TABLET TWICE A DAY BEFORE MEALS.   No facility-administered encounter medications on file as of 09/26/2020.    Review of Systems  Constitutional: Negative for chills and fever.  HENT: Positive for congestion, postnasal drip, rhinorrhea, sinus pressure, sneezing and sore throat. Negative for ear discharge and ear pain.   Eyes: Negative for pain, redness and visual disturbance.  Respiratory: Positive for cough. Negative for chest tightness, shortness of breath and wheezing.   Cardiovascular: Negative for chest pain and leg swelling.  Genitourinary: Negative for difficulty urinating and dysuria.  Musculoskeletal: Negative for back pain and gait problem.  Skin: Negative for rash.  Neurological: Negative for light-headedness and headaches.  Psychiatric/Behavioral: Negative for agitation and behavioral problems.  All other systems reviewed and are negative.   Observations/Objective: Patient sounds comfortable and in no acute distress  Assessment and Plan: Problem List Items Addressed This Visit   None   Visit Diagnoses    Acute non-recurrent sinusitis, unspecified location    -  Primary   Relevant Medications   amoxicillin (AMOXIL) 500 MG capsule      Will treat like sinus infection, patient does not think Covid and does not want to get Covid tested. Follow up plan: Return if symptoms worsen or fail to improve.     I discussed the assessment and treatment plan with the patient. The patient was provided an opportunity  to ask questions and all were answered. The patient agreed with the plan and demonstrated an understanding of the instructions.   The patient was advised to call back or seek an in-person evaluation if the symptoms worsen or if the condition fails to improve as anticipated.  The above assessment and management plan was discussed with the patient. The patient  verbalized understanding of and has agreed to the management plan. Patient is aware to call the clinic if symptoms persist or worsen. Patient is aware when to return to the clinic for a follow-up visit. Patient educated on when it is appropriate to go to the emergency department.    I provided 11 minutes of non-face-to-face time during this encounter.    Worthy Rancher, MD

## 2020-10-03 ENCOUNTER — Other Ambulatory Visit: Payer: Self-pay | Admitting: Family

## 2020-10-03 ENCOUNTER — Ambulatory Visit (INDEPENDENT_AMBULATORY_CARE_PROVIDER_SITE_OTHER): Payer: Medicare HMO | Admitting: Licensed Clinical Social Worker

## 2020-10-03 DIAGNOSIS — I159 Secondary hypertension, unspecified: Secondary | ICD-10-CM

## 2020-10-03 DIAGNOSIS — F320 Major depressive disorder, single episode, mild: Secondary | ICD-10-CM

## 2020-10-03 DIAGNOSIS — E785 Hyperlipidemia, unspecified: Secondary | ICD-10-CM

## 2020-10-03 DIAGNOSIS — E039 Hypothyroidism, unspecified: Secondary | ICD-10-CM

## 2020-10-03 DIAGNOSIS — K219 Gastro-esophageal reflux disease without esophagitis: Secondary | ICD-10-CM

## 2020-10-03 DIAGNOSIS — F411 Generalized anxiety disorder: Secondary | ICD-10-CM

## 2020-10-03 NOTE — Progress Notes (Signed)
I have reviewed and agree with the above  documentation.   [name], FNP

## 2020-10-03 NOTE — Patient Instructions (Addendum)
Licensed Clinical Social Worker Visit Information  Goals we discussed today:  .  Client will talk with LCSW in next 30 days to discuss anxiety /stress issues of concern for patinet at this time (pt-stated)        [place] (see longtitudinal plan of care for additional care plan information)  Current Barriers:   Mental health issues of concern (anxiety/stress) in client with Chronic Diagnoses of HTN, GAD, GERD, and HLD  Decreased energy  Transportation challenges.  Clinical Social Work Clinical Goal(s):   LCSW will talk with client in next 30 days to discuss client managenet of anxiety or stress issues.   Interventions:   Talked with client about her current needs  Talked with client about CCM program support services  Talked with Ivalene about anxiety or stress issues she faces.   Talked with Arminta about transport needs of client  Talked with client about ambulation challenges  Talked with client about health needs of her spouse  Talked with client about energy level of client  Talked with client about relaxation techniques (likes to watch TV, listens to music, likes to do word search puzzles)  Talked with client about vision of client (she said she has an appointment next week for an eye exam)  Talked with client about upcoming client appointments  Encouraged client to call RNCM as needed for nursing support  Talked with client about mood of client  Talked with client  about sleeping issues of client  Talked with client about appetite of client  Patient Self Care Activities:  Takes medications as prescribed Attends scheduled medical appointments  Patient SELF CARE DEFICITS: Transportation challenges Sleeping challenges Mental health issues    Initial goal documentation     Follow Up Plan: LCSW will call client in next 4 weeks to discuss client management of anxiety or stress issues  Materials Provided: No  The patient  verbalized understanding of instructions provided today and declined a print copy of patient instruction materials.   Norva Riffle.Rukia Mcgillivray MSW, LCSW Licensed Clinical Social Worker [facility] Care Management [phone]

## 2020-10-03 NOTE — Chronic Care Management (AMB) (Signed)
Chronic Care Management    Clinical Social Work Follow Up Note  10/03/2020 Name: Severina Sykora MRN: [id number] DOB: Dec 15, 1940  Tonishia Steffy is a 80 y.o. year old female who is a primary care patient of [name], FNP. The CCM team was consulted for assistance with [name] .   Review of patient status, including review of consultants reports, other relevant assessments, and collaboration with appropriate care team members and the patient's provider was performed as part of comprehensive patient evaluation and provision of chronic care management services.    SDOH (Social Determinants of Health) assessments performed: No; risk for   depression; risk for tobacco use; risk for stress; risk for transport needs  [place] Office Visit from 08/13/2020 in [place]  PHQ-9 Total Score 3      GAD 7 : Generalized Anxiety Score 04/13/2020 01/20/2020 11/08/2019 11/02/2019  Nervous, Anxious, on Edge 0 0 1 1  Control/stop worrying 0 0 1 0  Worry too much - different things 0 0 1 0  Trouble relaxing 0 0 1 0  Restless 0 0 0 0  Easily annoyed or irritable 1 0 1 0  Afraid - awful might happen 0 0 0 0  Total GAD 7 Score 1 0 5 1  Anxiety Difficulty Not difficult at all Not difficult at all Somewhat difficult -    Outpatient Encounter Medications as of 10/03/2020  Medication Sig  . ALPRAZolam (XANAX) 0.25 MG tablet TAKE 1 TABLET TWICE DAILY AS NEEDED FOR ANXIETY  . amoxicillin (AMOXIL) 500 MG capsule Take 1 capsule (500 mg total) by mouth 2 (two) times daily.  Marland Kitchen atorvastatin (LIPITOR) 20 MG tablet TAKE 1 TABLET DAILY  . Chlorphen-PE-Acetaminophen (NOREL AD PO) Take 1 tablet by mouth as needed.   . Cholecalciferol (VITAMIN D3) 125 MCG (5000 UT) CAPS Take 1 capsule (5,000 Units total) by mouth daily.  . fluticasone (FLONASE) 50 MCG/ACT nasal spray USE 1 SPRAY IN EACH NOSTRIL ONCE DAILY.  Marland Kitchen irbesartan (AVAPRO) 75 MG tablet TAKE 1 TABLET DAILY  . levothyroxine  (SYNTHROID) 50 MCG tablet Take 1 tablet (50 mcg total) by mouth daily.  . meclizine (ANTIVERT) 25 MG tablet TAKE ONE TABLET BY MOUTH THREE TIMES DAILY AS NEEDED FOR DIZZINESS  . pantoprazole (PROTONIX) 20 MG tablet TAKE (1) TABLET TWICE A DAY BEFORE MEALS.   No facility-administered encounter medications on file as of 10/03/2020.    Goals    .  Client will talk with LCSW in next 30 days to discuss anxiety /stress issues of concern for patinet at this time (pt-stated)      [place] (see longtitudinal plan of care for additional care plan information)  Current Barriers:  Marland Kitchen Mental health issues of concern (anxiety/stress) in client with Chronic Diagnoses of HTN, GAD, GERD, and HLD . Decreased energy . Transportation challenges.  Clinical Social Work Clinical Goal(s):  Marland Kitchen LCSW will talk with client in next 30 days to discuss client managenet of anxiety or stress issues.   Interventions:   Talked with client about her current needs  Talked with client about CCM program support services  Talked with Deniss about anxiety or stress issues she faces.   Talked with Pauletta about transport needs of client  Talked with client about ambulation challenges  Talked with client about health needs of her spouse  Talked with client about energy level of client  Talked with client about relaxation techniques (likes to watch TV, listens to music, likes  to do word search puzzles)  Talked with client about vision of client (she said she has an appointment next week for an eye exam)  Talked with client about upcoming client appointments  Encouraged client to call RNCM as needed for nursing support  Talked with client about mood of client  Talked with client  about sleeping issues of client  Talked with client about appetite of client  Patient Self Care Activities:  Takes medications as prescribed Attends scheduled medical appointments  Patient SELF CARE DEFICITS: Transportation  challenges Sleeping challenges Mental health issues    Initial goal documentation     Follow Up Plan:  LCSW will call client in next 4 weeks to discuss client management of anxiety or stress issues  Norva Riffle.Shanyn Preisler MSW, LCSW Licensed Clinical Social Worker [facility] Care Management [phone]

## 2020-10-09 DIAGNOSIS — Z0100 Encounter for examination of eyes and vision without abnormal findings: Secondary | ICD-10-CM | POA: Diagnosis not present

## 2020-10-09 DIAGNOSIS — H5200 Hypermetropia, unspecified eye: Secondary | ICD-10-CM | POA: Diagnosis not present

## 2020-10-22 ENCOUNTER — Encounter: Payer: Self-pay | Admitting: Family

## 2020-10-22 ENCOUNTER — Ambulatory Visit: Payer: Medicare HMO

## 2020-10-22 ENCOUNTER — Ambulatory Visit (INDEPENDENT_AMBULATORY_CARE_PROVIDER_SITE_OTHER): Payer: Medicare HMO | Admitting: Family

## 2020-10-22 DIAGNOSIS — J019 Acute sinusitis, unspecified: Secondary | ICD-10-CM

## 2020-10-22 MED ORDER — AMOXICILLIN 500 MG PO CAPS: 500.0000 mg | ORAL_CAPSULE | Freq: Three times a day (TID) | ORAL | 0 refills | Status: AC

## 2020-10-22 NOTE — Progress Notes (Signed)
Virtual Visit via telephone Note Due to COVID-19 pandemic this visit was conducted virtually. This visit type was conducted due to national recommendations for restrictions regarding the COVID-19 Pandemic (e.g. social distancing, sheltering in place) in an effort to limit this patient's exposure and mitigate transmission in our community. All issues noted in this document were discussed and addressed.  A physical exam was not performed with this format.  I connected with Susan Contreras on 10/22/20 at 10:17 AM  by telephone and verified that I am speaking with the correct person using two identifiers. Susan Contreras is currently located at home and no one is currently with her during visit. The provider, [name], FNP is located in their office at time of visit.  I discussed the limitations, risks, security and privacy concerns of performing an evaluation and management service by telephone and the availability of in person appointments. I also discussed with the patient that there may be a patient responsible charge related to this service. The patient expressed understanding and agreed to proceed.   History and Present Illness:  Pt calls the office today with recurrent sinus/cough issues. She reports she had similar symptoms earlier in the month and was given Amoxicillin 500 mg twice a day for 10 days. She has multiple "allergies" to antibiotics and states she can not tolerate them other than the amoxicillin 500 mg. She states she got sick and was not able to complete the entire course of antibiotics and her symptoms have returned. Cough This is a recurrent problem. The current episode started in the past 7 days. The problem has been waxing and waning. The problem occurs every few minutes. The cough is non-productive. Associated symptoms include headaches, nasal congestion and postnasal drip. Pertinent negatives include no chills, ear congestion, ear pain, myalgias, sore throat, shortness of breath  or wheezing. The symptoms are aggravated by lying down. She has tried rest and OTC cough suppressant for the symptoms. The treatment provided mild relief.       Review of Systems  Constitutional: Negative for chills.  HENT: Positive for postnasal drip. Negative for ear pain and sore throat.   Respiratory: Positive for cough. Negative for shortness of breath and wheezing.   Musculoskeletal: Negative for myalgias.  Neurological: Positive for headaches.  All other systems reviewed and are negative.    Observations/Objective: No SOB or distress noted   Assessment and Plan: 1. Acute sinusitis, recurrence not specified, unspecified location Rest - Take meds as prescribed - Use a cool mist humidifier  -Use saline nose sprays frequently -Force fluids -For any cough or congestion  Use plain Mucinex- regular strength or max strength is fine -For fever or aces or pains- take tylenol or ibuprofen. -Throat lozenges if help -Call if symptoms worsen or do not improve  - amoxicillin (AMOXIL) 500 MG capsule; Take 1 capsule (500 mg total) by mouth 3 (three) times daily for 7 days.  Dispense: 20 capsule; Refill: 0        I discussed the assessment and treatment plan with the patient. The patient was provided an opportunity to ask questions and all were answered. The patient agreed with the plan and demonstrated an understanding of the instructions.   The patient was advised to call back or seek an in-person evaluation if the symptoms worsen or if the condition fails to improve as anticipated.  The above assessment and management plan was discussed with the patient. The patient verbalized understanding of and has agreed to the management plan.  Patient is aware to call the clinic if symptoms persist or worsen. Patient is aware when to return to the clinic for a follow-up visit. Patient educated on when it is appropriate to go to the emergency department.   Time call ended:  10:31 AM   I  provided 13 minutes of non-face-to-face time during this encounter.    [name], FNP

## 2020-11-05 ENCOUNTER — Ambulatory Visit (INDEPENDENT_AMBULATORY_CARE_PROVIDER_SITE_OTHER): Payer: Medicare HMO | Admitting: Licensed Clinical Social Worker

## 2020-11-05 DIAGNOSIS — E785 Hyperlipidemia, unspecified: Secondary | ICD-10-CM

## 2020-11-05 DIAGNOSIS — F320 Major depressive disorder, single episode, mild: Secondary | ICD-10-CM

## 2020-11-05 DIAGNOSIS — F411 Generalized anxiety disorder: Secondary | ICD-10-CM

## 2020-11-05 DIAGNOSIS — K219 Gastro-esophageal reflux disease without esophagitis: Secondary | ICD-10-CM

## 2020-11-05 DIAGNOSIS — I159 Secondary hypertension, unspecified: Secondary | ICD-10-CM

## 2020-11-05 NOTE — Chronic Care Management (AMB) (Signed)
Chronic Care Management    Clinical Social Work Note  11/05/2020 Name: Susan Contreras MRN: [id number] DOB: 1940/09/08  Susan Contreras is a 80 y.o. year old female who is a primary care patient of Susan Balloon, FNP. The CCM team was consulted to assist the patient with chronic disease management and/or care coordination needs related to: Mental Health Counseling and Resources.   Engaged with patient by telephone for follow up visit in response to provider referral for social work chronic care management and care coordination services.   Consent to Services:  The patient was given information about Chronic Care Management services, agreed to services, and gave verbal consent prior to initiation of services.  Please see initial visit note for detailed documentation.   Patient agreed to services and consent obtained.   Assessment: Review of patient past medical history, allergies, medications, and health status, including review of relevant consultants reports was performed today as part of a comprehensive evaluation and provision of chronic care management and care coordination services.     SDOH (Social Determinants of Health) assessments and interventions performed:    Advanced Directives Status: See [employer]yn[employer] application for related entries.  CCM Care Plan  Allergies  Allergen Reactions  . Biaxin [Clarithromycin]   . Cephalexin   . Ciprofloxacin   . Doxycycline     Headaches  . Flagyl [Metronidazole Hcl]   . Ketek [Telithromycin]   . Sulfa Antibiotics     Outpatient Encounter Medications as of 11/05/2020  Medication Sig  . ALPRAZolam (XANAX) 0.25 MG tablet TAKE 1 TABLET TWICE DAILY AS NEEDED FOR ANXIETY  . atorvastatin (LIPITOR) 20 MG tablet TAKE 1 TABLET DAILY  . Chlorphen-PE-Acetaminophen (NOREL AD PO) Take 1 tablet by mouth as needed.   . Cholecalciferol (VITAMIN D3) 125 MCG (5000 UT) CAPS Take 1 capsule (5,000 Units total) by mouth daily.  . fluticasone (FLONASE) 50 MCG/ACT  nasal spray USE 1 SPRAY IN EACH NOSTRIL ONCE DAILY.  Marland Kitchen irbesartan (AVAPRO) 75 MG tablet TAKE 1 TABLET DAILY  . levothyroxine (SYNTHROID) 50 MCG tablet Take 1 tablet (50 mcg total) by mouth daily.  . meclizine (ANTIVERT) 25 MG tablet TAKE ONE TABLET BY MOUTH THREE TIMES DAILY AS NEEDED FOR DIZZINESS  . pantoprazole (PROTONIX) 20 MG tablet TAKE (1) TABLET TWICE A DAY BEFORE MEALS.   No facility-administered encounter medications on file as of 11/05/2020.    Patient Active Problem List   Diagnosis Date Noted  . History of colonic polyps 04/26/2020  . Benzodiazepine dependence ([place]) 05/19/2018  . Controlled substance agreement signed 05/19/2018  . Obesity (BMI 30.0-34.9) 11/26/2017  . Metabolic syndrome 63/87/5643  . GAD (generalized anxiety disorder) 08/14/2015  . Allergic rhinitis 08/14/2015  . GERD (gastroesophageal reflux disease) 08/14/2015  . Hyperlipidemia 08/14/2015  . Vitamin D deficiency 08/14/2015  . Hypothyroidism 05/29/2014  . Cholelithiasis 01/15/2011  . Family history of colon cancer 01/15/2011  . Hypertension 11/11/2007  . ESOPHAGEAL STRICTURE 11/11/2007  . Diaphragmatic hernia 11/11/2007  . DIVERTICULOSIS OF COLON 11/11/2007    Conditions to be addressed/monitored: Anxiety; Stress issues faced  Care Plan : General Social Work (Adult)  Updates made by Susan Cabal, LCSW since 11/05/2020 12:00 AM    Problem: Emotional Distress     Goal: Client needs help in managing anxiety and stress issues faced   Start Date: 11/05/2020  Expected End Date: 02/05/2021  This Visit's Progress: On track  Priority: Medium  Note:   Current Barriers:  . Chronic Mental Health needs related to  anxiety and stress issues faced . Limited social support and Mental Health Concerns  . Suicidal Ideation/Homicidal Ideation: No  Clinical Social Work Goal(s):  . patient will work with SW monthly by telephone or in person to reduce or manage symptoms related to anxiety or stress issues  faced . patient will work with SW to address concerns related to ongoing in home care needs of her spouse  Interventions: . Patient interviewed and appropriate assessments performed . Provided mental health counseling with regard to anxiety or stress symptoms management . Motivational Interviewing . Talked with client about ongoing in home care needs of her spouse . Talked with client about transport needs of client . Talked with client about sleeping issues of client . Talked with client about mood of client . Talked with client about medication procurement for client . Talked with client about appetite of client . Talked with client about upcoming client appointments  Patient Self Care Activities:  . Self administers medications as prescribed . Attends all scheduled provider appointments . Calls provider office for new concerns or questions  Patient Coping Strengths:  . Supportive Relationships . Family . Friends  Patient Self Care Deficits:  . Anxiety or stress issues related to managing ongoing chronic care needs of her spouse  Patient Goals:  - spend time or talk with others every day - practice relaxation or meditation daily - keep a calendar with appointment dates  Follow Up Plan: LCSW to call client on 12/06/2020 to assess client needs      Susan Contreras.Susan Contreras MSW, LCSW Licensed Clinical Social Worker [facility] Care Management [phone]

## 2020-11-05 NOTE — Patient Instructions (Signed)
Visit Information  Goals Addressed              This Visit's Progress   .  Self-Management Plan Developed: Client wants to use strategies to manage daily stress or anxiety issues faced (pt-stated)   On track     needs help in managing anxiety and stress issues faced     Timeframe: [place]  This Visit's Progress: On Track  Priority: Medium   Start Date: 11/05/2020  Expected End Date 02/05/2021  Patient Self Care Activities:  . Prepares meals and eats meals independently . Drives to needed appointments and to complete errands . Takes medications as prescribed . Does ADLs independently   Patient Goals:  Self Administer medications as prescribed Attend scheduled provided appointments Call provider office for new concerns or questions Call RNCM or LCSW as needed for CCM support  Follow Up Plan:  LCSW to call client on 12/06/2020 to assess client needs           Follow Up Plan:  LCSW to call client on 12/06/2020 to assess client needs  Norva Riffle.Sheppard Luckenbach MSW, LCSW Licensed Clinical Social Worker [facility] Care Management [phone]

## 2020-11-12 ENCOUNTER — Other Ambulatory Visit: Payer: Self-pay | Admitting: Family

## 2020-11-19 ENCOUNTER — Ambulatory Visit: Payer: Medicare HMO | Admitting: *Deleted

## 2020-11-19 DIAGNOSIS — F320 Major depressive disorder, single episode, mild: Secondary | ICD-10-CM

## 2020-11-19 DIAGNOSIS — I159 Secondary hypertension, unspecified: Secondary | ICD-10-CM | POA: Diagnosis not present

## 2020-11-19 DIAGNOSIS — E785 Hyperlipidemia, unspecified: Secondary | ICD-10-CM

## 2020-11-19 DIAGNOSIS — K219 Gastro-esophageal reflux disease without esophagitis: Secondary | ICD-10-CM

## 2020-11-19 DIAGNOSIS — F411 Generalized anxiety disorder: Secondary | ICD-10-CM

## 2020-11-19 NOTE — Patient Instructions (Signed)
Visit Information  PATIENT GOALS: Goals Addressed            This Visit's Progress   . Manage GI Complaints       Timeframe:  Short-Term Goal Priority:  Medium Start Date:  11/19/20                           Expected End Date:  01/19/21                      Follow-up 12/14/20  . Call [phone] to schedule an office visit with Dr Laural Golden (GI) to discuss potential need for colonoscopy . Call RN Care Manager as needed [phone]       Patient verbalizes understanding of instructions provided today and agrees to view in [place].   Follow Up Plan:  . Telephone follow up appointment with care management team member scheduled for: LCSW on 12/06/20, RNCM on 12/14/20 . The patient has been provided with contact information for the care management team and has been advised to call with any health related questions or concerns.  . Follow up with provider re: GI for irregular bowel movements and dysphagia  Chong Sicilian, BSN, RN-BC [place] / [place] Management Direct Dial: [phone]

## 2020-11-19 NOTE — Chronic Care Management (AMB) (Signed)
Chronic Care Management   CCM RN Visit Note  11/19/2020 Name: Susan Contreras MRN: [id number] DOB: 1941-02-01  Subjective: Susan Contreras is a 80 y.o. year old female who is a primary care patient of [name], FNP. The care management team was consulted for assistance with disease management and care coordination needs.    Engaged with patient by telephone for follow up visit in response to provider referral for case management and/or care coordination services.   Consent to Services:  The patient was given information about Chronic Care Management services, agreed to services, and gave verbal consent prior to initiation of services.  Please see initial visit note for detailed documentation.   Patient agreed to services and verbal consent obtained.   Assessment: Review of patient past medical history, allergies, medications, health status, including review of consultants reports, laboratory and other test data, was performed as part of comprehensive evaluation and provision of chronic care management services.   SDOH (Social Determinants of Health) assessments and interventions performed:    CCM Care Plan  Allergies  Allergen Reactions  . Biaxin [Clarithromycin]   . Cephalexin   . Ciprofloxacin   . Doxycycline     Headaches  . Flagyl [Metronidazole Hcl]   . Ketek [Telithromycin]   . Sulfa Antibiotics     Outpatient Encounter Medications as of 11/19/2020  Medication Sig  . ALPRAZolam (XANAX) 0.25 MG tablet TAKE 1 TABLET TWICE DAILY AS NEEDED FOR ANXIETY  . atorvastatin (LIPITOR) 20 MG tablet TAKE 1 TABLET DAILY  . Chlorphen-PE-Acetaminophen (NOREL AD PO) Take 1 tablet by mouth as needed.   . Cholecalciferol (VITAMIN D3) 125 MCG (5000 UT) CAPS Take 1 capsule (5,000 Units total) by mouth daily.  . fluticasone (FLONASE) 50 MCG/ACT nasal spray USE 1 SPRAY IN EACH NOSTRIL ONCE DAILY.  Marland Kitchen irbesartan (AVAPRO) 75 MG tablet TAKE 1 TABLET DAILY  . levothyroxine (SYNTHROID) 50 MCG  tablet Take 1 tablet (50 mcg total) by mouth daily.  . meclizine (ANTIVERT) 25 MG tablet TAKE ONE TABLET BY MOUTH THREE TIMES DAILY AS NEEDED FOR DIZZINESS  . pantoprazole (PROTONIX) 20 MG tablet TAKE (1) TABLET TWICE A DAY BEFORE MEALS.   No facility-administered encounter medications on file as of 11/19/2020.    Patient Active Problem List   Diagnosis Date Noted  . History of colonic polyps 04/26/2020  . Benzodiazepine dependence ([place]) 05/19/2018  . Controlled substance agreement signed 05/19/2018  . Obesity (BMI 30.0-34.9) 11/26/2017  . Metabolic syndrome 63/33/5456  . GAD (generalized anxiety disorder) 08/14/2015  . Allergic rhinitis 08/14/2015  . GERD (gastroesophageal reflux disease) 08/14/2015  . Hyperlipidemia 08/14/2015  . Vitamin D deficiency 08/14/2015  . Hypothyroidism 05/29/2014  . Cholelithiasis 01/15/2011  . Family history of colon cancer 01/15/2011  . Hypertension 11/11/2007  . ESOPHAGEAL STRICTURE 11/11/2007  . Diaphragmatic hernia 11/11/2007  . DIVERTICULOSIS OF COLON 11/11/2007    Conditions to be addressed/monitored:HTN, GAD, GERD, HLD, Hypothyroidism,Depression  Care Plan : RNCM:Wellness (Adult)  Updates made by Ilean China, RN since 11/19/2020 12:00 AM    Problem: Gastrointestinal Concerns   Priority: Medium    Goal: Follow-up with Gastroenterologist   Start Date: 11/19/2020  This Visit's Progress: Not on track  Priority: Medium  Note:   Current Barriers:  Marland Kitchen Knowledge Deficits related to colonoscopy/endoscopy risks vs benefits . Anxiety . Caregiver strain  Nurse Case Manager Clinical Goal(s):  . patient will work with Gastroenterologist to address needs related to medical management of GI related complaints in a patient  with HTN, GAD, GERD, HLD, Hypothyroidism, Depression  Interventions:  . 1:1 collaboration with [name], FNP regarding development and update of comprehensive plan of care as evidenced by provider attestation and  co-signature . Inter-disciplinary care team collaboration (see longitudinal plan of care) . Chart reviewed including relevant PCP and GI office notes and referral notes . Discussed patient's concerns about having a colonoscopy after age 19 . Discussed family hx of colon cancer in sister at age 57 . Discussed personal hx of colon polyps and that last colonoscopy was in 2007 despite being recommended every 3 years . Discussed complaints of irregular bowel movements and difficulty swallowing at times . Discussed personal hx of esophageal dilation due to stricture  . Encouraged patient to reach out to GI at [phone] to schedule a follow-up office visit and discuss colonscopy risks vs benefits . Previously provided with RN Care Manager contact number and encouraged to reach out as needed . Encouraged to talk with LCSW regarding psychosocial concerns  Patient Goals/Self-Care Activities Over the next 30 days, patient will: . Call [phone] to schedule an office visit with Dr Laural Golden (GI) to discuss potential need for colonoscopy . Call RN Care Manager as needed [phone]  Follow Up Plan:  . Telephone follow up appointment with care management team member scheduled for: LCSW on 12/06/20, RNCM on 12/14/20 . The patient has been provided with contact information for the care management team and has been advised to call with any health related questions or concerns.  . Follow up with provider re: GI for irregular bowel movements and dysphagia  Chong Sicilian, BSN, RN-BC [place] / [address] Management Direct Dial: [phone]

## 2020-11-24 ENCOUNTER — Other Ambulatory Visit: Payer: Self-pay | Admitting: Family

## 2020-11-24 DIAGNOSIS — I10 Essential (primary) hypertension: Secondary | ICD-10-CM

## 2020-11-28 ENCOUNTER — Telehealth: Payer: Self-pay | Admitting: Family

## 2020-11-28 MED ORDER — LEVOTHYROXINE SODIUM 50 MCG PO TABS: 50.0000 ug | ORAL_TABLET | Freq: Every day | ORAL | 1 refills | Status: DC

## 2020-11-28 MED ORDER — ATORVASTATIN CALCIUM 20 MG PO TABS: 1.0000 | ORAL_TABLET | Freq: Every day | ORAL | 1 refills | Status: DC

## 2020-11-28 NOTE — Telephone Encounter (Signed)
Pt wants a 90 day supply sent in rather than 30 for atorvastatin and levothyroxine, said that it usually is sent for 90 days but when she went to pick it up it was only

## 2020-11-28 NOTE — Telephone Encounter (Signed)
Called patient refill sent in for 90 days +1  patient aware

## 2020-11-28 NOTE — Telephone Encounter (Signed)
Pt wants a 90 day supply sent in rather than 30 for atorvastatin and levothyroxine, said that it usually is sent for 90 days but when she went to pick it up it was only 30 days worth

## 2020-11-30 ENCOUNTER — Telehealth: Payer: Self-pay

## 2020-11-30 DIAGNOSIS — I10 Essential (primary) hypertension: Secondary | ICD-10-CM

## 2020-11-30 MED ORDER — MECLIZINE HCL 25 MG PO TABS: 25.0000 mg | ORAL_TABLET | Freq: Three times a day (TID) | ORAL | 1 refills | Status: DC | PRN

## 2020-11-30 MED ORDER — IRBESARTAN 75 MG PO TABS: 75.0000 mg | ORAL_TABLET | Freq: Every day | ORAL | 0 refills | Status: DC

## 2020-11-30 NOTE — Telephone Encounter (Signed)
Stew called has question about meclizine (ANTIVERT) 25 MG tablet--please call back

## 2020-11-30 NOTE — Telephone Encounter (Signed)
Called pharm

## 2020-12-06 ENCOUNTER — Telehealth: Payer: Medicare HMO

## 2020-12-14 ENCOUNTER — Ambulatory Visit (INDEPENDENT_AMBULATORY_CARE_PROVIDER_SITE_OTHER): Payer: Medicare HMO | Admitting: *Deleted

## 2020-12-14 DIAGNOSIS — E039 Hypothyroidism, unspecified: Secondary | ICD-10-CM

## 2020-12-14 DIAGNOSIS — F411 Generalized anxiety disorder: Secondary | ICD-10-CM

## 2020-12-14 DIAGNOSIS — K219 Gastro-esophageal reflux disease without esophagitis: Secondary | ICD-10-CM

## 2020-12-21 ENCOUNTER — Encounter: Payer: Self-pay | Admitting: *Deleted

## 2020-12-21 NOTE — Chronic Care Management (AMB) (Signed)
Chronic Care Management   CCM RN Visit Note  12/14/2020 Name: Susan Contreras MRN: [id number] DOB: 08/04/1941  Subjective: Susan Contreras is a 80 y.o. year old female who is a primary care patient of [name], FNP. The care management team was consulted for assistance with disease management and care coordination needs.    Engaged with patient by telephone for follow up visit in response to provider referral for case management and/or care coordination services.   Consent to Services:  The patient was given information about Chronic Care Management services, agreed to services, and gave verbal consent prior to initiation of services.  Please see initial visit note for detailed documentation.   Patient agreed to services and verbal consent obtained.   Assessment: Review of patient past medical history, allergies, medications, health status, including review of consultants reports, laboratory and other test data, was performed as part of comprehensive evaluation and provision of chronic care management services.   SDOH (Social Determinants of Health) assessments and interventions performed:    CCM Care Plan  Allergies  Allergen Reactions  . Biaxin [Clarithromycin]   . Cephalexin   . Ciprofloxacin   . Doxycycline     Headaches  . Flagyl [Metronidazole Hcl]   . Ketek [Telithromycin]   . Sulfa Antibiotics     Outpatient Encounter Medications as of 12/14/2020  Medication Sig  . ALPRAZolam (XANAX) 0.25 MG tablet TAKE 1 TABLET TWICE DAILY AS NEEDED FOR ANXIETY  . atorvastatin (LIPITOR) 20 MG tablet Take 1 tablet (20 mg total) by mouth daily.  . Chlorphen-PE-Acetaminophen (NOREL AD PO) Take 1 tablet by mouth as needed.   . Cholecalciferol (VITAMIN D3) 125 MCG (5000 UT) CAPS Take 1 capsule (5,000 Units total) by mouth daily.  . fluticasone (FLONASE) 50 MCG/ACT nasal spray USE 1 SPRAY IN EACH NOSTRIL ONCE DAILY.  Marland Kitchen irbesartan (AVAPRO) 75 MG tablet Take 1 tablet (75 mg total) by mouth  daily. (NEEDS TO BE SEEN BEFORE NEXT REFILL)  . levothyroxine (SYNTHROID) 50 MCG tablet Take 1 tablet (50 mcg total) by mouth daily.  . meclizine (ANTIVERT) 25 MG tablet Take 1 tablet (25 mg total) by mouth 3 (three) times daily as needed for dizziness.  . pantoprazole (PROTONIX) 20 MG tablet TAKE (1) TABLET TWICE A DAY BEFORE MEALS.   No facility-administered encounter medications on file as of 12/14/2020.    Patient Active Problem List   Diagnosis Date Noted  . History of colonic polyps 04/26/2020  . Benzodiazepine dependence ([place]) 05/19/2018  . Controlled substance agreement signed 05/19/2018  . Obesity (BMI 30.0-34.9) 11/26/2017  . Metabolic syndrome 92/42/6834  . GAD (generalized anxiety disorder) 08/14/2015  . Allergic rhinitis 08/14/2015  . GERD (gastroesophageal reflux disease) 08/14/2015  . Hyperlipidemia 08/14/2015  . Vitamin D deficiency 08/14/2015  . Hypothyroidism 05/29/2014  . Cholelithiasis 01/15/2011  . Family history of colon cancer 01/15/2011  . Hypertension 11/11/2007  . ESOPHAGEAL STRICTURE 11/11/2007  . Diaphragmatic hernia 11/11/2007  . DIVERTICULOSIS OF COLON 11/11/2007    Conditions to be addressed/monitored:Anxiety and hypothryoidism and GI complaints  Care Plan : RNCM:Wellness (Adult)    Problem: Gastrointestinal Concerns   Priority: Medium    Goal: Follow-up with Gastroenterologist   Start Date: 11/19/2020  This Visit's Progress: On track  Recent Progress: Not on track  Priority: Medium  Note:   Current Barriers:  Marland Kitchen Knowledge Deficits related to colonoscopy/endoscopy risks vs benefits . Anxiety . Caregiver strain  Nurse Case Manager Clinical Goal(s):  . patient will work with  Gastroenterologist to address needs related to medical management of GI related complaints in a patient with HTN, GAD, GERD, HLD, Hypothyroidism,Depression  Interventions:  . 1:1 collaboration with [name], FNP regarding development and update of comprehensive  plan of care as evidenced by provider attestation and co-signature . Inter-disciplinary care team collaboration (see longitudinal plan of care) . Chart reviewed including relevant PCP and GI office notes and referral notes . Therapeutic listening utilized regarding patient's concerns about having a colonoscopy after age 89 . Questions answered . Discussed family hx of colon cancer in sister at age 44 . Discussed personal hx of colon polyps and that last colonoscopy was in 2007 despite being recommended every 3 years . Reviewed upcoming appointment with Dr Laural Golden for a consult on 01/03/21 . Previously provided with RN Care Manager contact number and encouraged to reach out as needed . Encouraged to talk with LCSW regarding psychosocial concerns  Patient Goals/Self-Care Activities Over the next 30 days, patient will: . Keep appointment with Dr Laural Golden (GI) to discuss potential need for colonoscopy on 01/03/21 . Reach out to GI with any new or worsening symptoms . Call RN Care Manager as needed [phone]    Follow Up Plan:  . Telephone follow up appointment with care management team member scheduled for: 12/24/20 with LCSW, 01/08/21 with RNCM . The patient has been provided with contact information for the care management team and has been advised to call with any health related questions or concerns.  . Follow up with provider re: GI on 01/03/21  Chong Sicilian, BSN, RN-BC [place] / [place] Management Direct Dial: [phone]

## 2020-12-21 NOTE — Patient Instructions (Signed)
Visit Information  PATIENT GOALS: Goals Addressed            This Visit's Progress   . Manage GI Complaints   On track    Timeframe:  Short-Term Goal Priority:  Medium Start Date:  11/19/20                           Expected End Date:  01/19/21                      Follow-up 01/08/21  . Keep appointment with Dr Laural Golden (GI) to discuss potential need for colonoscopy on 01/03/21 . Reach out to GI with any new or worsening symptoms . Call RN Care Manager as needed [phone]       Patient verbalizes understanding of instructions provided today and agrees to view in [place].    Follow Up Plan:  . Telephone follow up appointment with care management team member scheduled for: 12/24/20 with LCSW, 01/08/21 with RNCM . The patient has been provided with contact information for the care management team and has been advised to call with any health related questions or concerns.  . Follow up with provider re: GI on 01/03/21  Chong Sicilian, BSN, RN-BC [place] / [place] Management Direct Dial: [phone]

## 2020-12-24 ENCOUNTER — Ambulatory Visit (INDEPENDENT_AMBULATORY_CARE_PROVIDER_SITE_OTHER): Payer: Medicare HMO | Admitting: Licensed Clinical Social Worker

## 2020-12-24 DIAGNOSIS — E785 Hyperlipidemia, unspecified: Secondary | ICD-10-CM

## 2020-12-24 DIAGNOSIS — E039 Hypothyroidism, unspecified: Secondary | ICD-10-CM | POA: Diagnosis not present

## 2020-12-24 DIAGNOSIS — F320 Major depressive disorder, single episode, mild: Secondary | ICD-10-CM | POA: Diagnosis not present

## 2020-12-24 DIAGNOSIS — I10 Essential (primary) hypertension: Secondary | ICD-10-CM | POA: Diagnosis not present

## 2020-12-24 DIAGNOSIS — F411 Generalized anxiety disorder: Secondary | ICD-10-CM

## 2020-12-24 DIAGNOSIS — K219 Gastro-esophageal reflux disease without esophagitis: Secondary | ICD-10-CM

## 2020-12-24 NOTE — Chronic Care Management (AMB) (Signed)
Chronic Care Management    Clinical Social Work Note  12/24/2020 Name: Susan Contreras MRN: [id number] DOB: 11/09/1940  Susan Contreras is a 80 y.o. year old female who is a primary care patient of [name], FNP. The CCM team was consulted to assist the patient with chronic disease management and/or care coordination needs related to: [name] .   Engaged with patient by telephone for follow up visit in response to provider referral for social work chronic care management and care coordination services.   Consent to Services:  The patient was given information about Chronic Care Management services, agreed to services, and gave verbal consent prior to initiation of services.  Please see initial visit note for detailed documentation.   Patient agreed to services and consent obtained.   Assessment: Review of patient past medical history, allergies, medications, and health status, including review of relevant consultants reports was performed today as part of a comprehensive evaluation and provision of chronic care management and care coordination services.     SDOH (Social Determinants of Health) assessments and interventions performed:  SDOH Interventions   Flowsheet Row Most Recent Value  SDOH Interventions   Depression Interventions/Treatment  Medication       Advanced Directives Status: See Vynca application for related entries.  CCM Care Plan  Allergies  Allergen Reactions  . Biaxin [Clarithromycin]   . Cephalexin   . Ciprofloxacin   . Doxycycline     Headaches  . Flagyl [Metronidazole Hcl]   . Ketek [Telithromycin]   . Sulfa Antibiotics     Outpatient Encounter Medications as of 12/24/2020  Medication Sig  . ALPRAZolam (XANAX) 0.25 MG tablet TAKE 1 TABLET TWICE DAILY AS NEEDED FOR ANXIETY  . atorvastatin (LIPITOR) 20 MG tablet Take 1 tablet (20 mg total) by mouth daily.  . Chlorphen-PE-Acetaminophen (NOREL AD PO) Take 1 tablet by mouth as needed.   .  Cholecalciferol (VITAMIN D3) 125 MCG (5000 UT) CAPS Take 1 capsule (5,000 Units total) by mouth daily.  . fluticasone (FLONASE) 50 MCG/ACT nasal spray USE 1 SPRAY IN EACH NOSTRIL ONCE DAILY.  Marland Kitchen irbesartan (AVAPRO) 75 MG tablet Take 1 tablet (75 mg total) by mouth daily. (NEEDS TO BE SEEN BEFORE NEXT REFILL)  . levothyroxine (SYNTHROID) 50 MCG tablet Take 1 tablet (50 mcg total) by mouth daily.  . meclizine (ANTIVERT) 25 MG tablet Take 1 tablet (25 mg total) by mouth 3 (three) times daily as needed for dizziness.  . pantoprazole (PROTONIX) 20 MG tablet TAKE (1) TABLET TWICE A DAY BEFORE MEALS.   No facility-administered encounter medications on file as of 12/24/2020.    Patient Active Problem List   Diagnosis Date Noted  . History of colonic polyps 04/26/2020  . Benzodiazepine dependence ([place]) 05/19/2018  . Controlled substance agreement signed 05/19/2018  . Obesity (BMI 30.0-34.9) 11/26/2017  . Metabolic syndrome 06/10/5101  . GAD (generalized anxiety disorder) 08/14/2015  . Allergic rhinitis 08/14/2015  . GERD (gastroesophageal reflux disease) 08/14/2015  . Hyperlipidemia 08/14/2015  . Vitamin D deficiency 08/14/2015  . Hypothyroidism 05/29/2014  . Cholelithiasis 01/15/2011  . Family history of colon cancer 01/15/2011  . Hypertension 11/11/2007  . ESOPHAGEAL STRICTURE 11/11/2007  . Diaphragmatic hernia 11/11/2007  . DIVERTICULOSIS OF COLON 11/11/2007    Conditions to be addressed/monitored: Monitor client management of anxiety and stress issues faced  Care Plan : General Social Work (Adult)  Updates made by Katha Cabal, LCSW since 12/24/2020 12:00 AM    Problem: Emotional Distress  Goal: Client needs help in managing anxiety and stress issues faced   Start Date: 11/05/2020  Expected End Date: 02/05/2021  This Visit's Progress: On track  Recent Progress: On track  Priority: Medium  Note:   Current Barriers:  . Chronic Mental Health needs related to anxiety and stress  issues faced . Limited social support and Mental Health Concerns  . Suicidal Ideation/Homicidal Ideation: No  Clinical Social Work Goal(s):  . patient will work with SW monthly by telephone or in person to reduce or manage symptoms related to anxiety or stress issues faced . patient will work with SW monthly to address concerns related to ongoing in home care needs of her spouse  Interventions: . 1:1 collaboration with [name], [place] regarding development and update of comprehensive plan of care as evidenced by provider attestation and co-signature  . Provided mental health counseling with regard to anxiety or stress symptoms management . Talked with client about ongoing care needs of her spouse . Talked with client about transport needs of client . Talked with client about sleeping issues of client . Talked with client about mood of client (client said she feels anxious occasionally) . Talked with client about medication procurement for client . Talked with client about appetite of client . Talked with client about upcoming client appointments . Talked with client about health needs of her sister . Talked with client about relaxation techniques (watches TV) . Talked with client about self care (allow time to rest and relax, allow time to do hobbies, eat meals on schedule, try to get adequate sleep at night)  Patient Self Care Activities:  . Self administers medications as prescribed . Attends all scheduled provider appointments . Calls provider office for new concerns or questions  Patient Coping Strengths:  . Supportive Relationships . Family . Friends  Patient Self Care Deficits:  . Anxiety or stress issues related to managing ongoing chronic care needs of her spouse  Patient Goals:  - spend time or talk with others every day - practice relaxation or meditation daily - keep a calendar with appointment dates  Follow Up Plan: LCSW to call client on 01/31/21 to assess  client needs      Norva Riffle.Wanetta Funderburke MSW, LCSW Licensed Clinical Social Worker [facility] Care Management [phone]

## 2020-12-24 NOTE — Patient Instructions (Signed)
Visit Information  PATIENT GOALS: Goals Addressed              This Visit's Progress   .  Self-Management Plan Developed: Client wants to use strategies to manage daily stress or anxiety issues faced (pt-stated)        needs help in managing anxiety and stress issues faced     Timeframe: Short Term  This Visit's Progress: On Track  Priority: Medium   Start Date: 11/05/2020  Expected End Date 02/05/2021  Patient Self Care Activities:  . Self administers medications as prescribed . Attends all scheduled provider appointments . Calls provider office for new concerns or questions  Patient Coping Strengths:  . Supportive Relationships . Family . Friends  Patient Self Care Deficits:  . Anxiety or stress issues related to managing ongoing chronic care needs of her spouse  Patient Goals:  - spend time or talk with others every day - practice relaxation or meditation daily - keep a calendar with appointment dates  Follow Up Plan: LCSW to call client on 01/31/21 to assess client needs               Norva Riffle.Candido Flott MSW, LCSW Licensed Clinical Social Worker [facility] Care Management [phone]

## 2020-12-25 NOTE — Telephone Encounter (Signed)
See phone note

## 2021-01-03 ENCOUNTER — Ambulatory Visit (INDEPENDENT_AMBULATORY_CARE_PROVIDER_SITE_OTHER): Payer: Medicare HMO | Admitting: Internal Medicine

## 2021-01-08 ENCOUNTER — Telehealth: Payer: Medicare HMO | Admitting: *Deleted

## 2021-01-16 ENCOUNTER — Ambulatory Visit: Payer: Medicare HMO

## 2021-01-17 ENCOUNTER — Ambulatory Visit (INDEPENDENT_AMBULATORY_CARE_PROVIDER_SITE_OTHER): Payer: Medicare HMO | Admitting: Family

## 2021-01-17 ENCOUNTER — Encounter: Payer: Self-pay | Admitting: Family

## 2021-01-17 DIAGNOSIS — J019 Acute sinusitis, unspecified: Secondary | ICD-10-CM

## 2021-01-17 MED ORDER — AMOXICILLIN 500 MG PO CAPS: 500.0000 mg | ORAL_CAPSULE | Freq: Three times a day (TID) | ORAL | 0 refills | Status: AC

## 2021-01-17 NOTE — Progress Notes (Signed)
   Virtual Visit  Note Due to COVID-19 pandemic this visit was conducted virtually. This visit type was conducted due to national recommendations for restrictions regarding the COVID-19 Pandemic (e.g. social distancing, sheltering in place) in an effort to limit this patient's exposure and mitigate transmission in our community. All issues noted in this document were discussed and addressed.  A physical exam was not performed with this format.  I connected with [name] on 01/17/21 at 12:13 pm  by telephone and verified that I am speaking with the correct person using two identifiers. Verity Gilcrest is currently located at home and  No one is currently with her during visit. The provider, [name], FNP is located in their office at time of visit.  I discussed the limitations, risks, security and privacy concerns of performing an evaluation and management service by telephone and the availability of in person appointments. I also discussed with the patient that there may be a patient responsible charge related to this service. The patient expressed understanding and agreed to proceed.   History and Present Illness:  Sinus Problem This is a new problem. The current episode started in the past 7 days. The problem has been gradually worsening since onset. There has been no fever. Her pain is at a severity of 9/10. The pain is mild. Associated symptoms include congestion, coughing, ear pain, headaches, a hoarse voice, sinus pressure and sneezing. Pertinent negatives include no shortness of breath or sore throat. Past treatments include acetaminophen and oral decongestants. The treatment provided mild relief.      Review of Systems  HENT: Positive for congestion, ear pain, hoarse voice, sinus pressure and sneezing. Negative for sore throat.   Respiratory: Positive for cough. Negative for shortness of breath.   Neurological: Positive for headaches.  All other systems reviewed and are  negative.    Observations/Objective: No SOB or distress noted, congestion noted  Assessment and Plan: 1. Acute sinusitis, recurrence not specified, unspecified location - Take meds as prescribed - Use a cool mist humidifier  -Use saline nose sprays frequently -Force fluids -For any cough or congestion  Use plain Mucinex- regular strength or max strength is fine -For fever or aces or pains- take tylenol or ibuprofen. -Throat lozenges if help -RTO if symptoms worsen or do not improve  - amoxicillin (AMOXIL) 500 MG capsule; Take 1 capsule (500 mg total) by mouth 3 (three) times daily for 10 days.  Dispense: 30 capsule; Refill: 0     I discussed the assessment and treatment plan with the patient. The patient was provided an opportunity to ask questions and all were answered. The patient agreed with the plan and demonstrated an understanding of the instructions.   The patient was advised to call back or seek an in-person evaluation if the symptoms worsen or if the condition fails to improve as anticipated.  The above assessment and management plan was discussed with the patient. The patient verbalized understanding of and has agreed to the management plan. Patient is aware to call the clinic if symptoms persist or worsen. Patient is aware when to return to the clinic for a follow-up visit. Patient educated on when it is appropriate to go to the emergency department.   Time call ended:  12:25 pm   I provided 12 minutes of  non face-to-face time during this encounter.    [name], FNP

## 2021-01-17 NOTE — Patient Instructions (Signed)
Sinusitis, Adult Sinusitis is inflammation of your sinuses. Sinuses are hollow spaces in the bones around your face. Your sinuses are located:  Around your eyes.  In the middle of your forehead.  Behind your nose.  In your cheekbones. Mucus normally drains out of your sinuses. When your nasal tissues become inflamed or swollen, mucus can become trapped or blocked. This allows bacteria, viruses, and fungi to grow, which leads to infection. Most infections of the sinuses are caused by a virus. Sinusitis can develop quickly. It can last for up to 4 weeks (acute) or for more than 12 weeks (chronic). Sinusitis often develops after a cold. What are the causes? This condition is caused by anything that creates swelling in the sinuses or stops mucus from draining. This includes:  Allergies.  Asthma.  Infection from bacteria or viruses.  Deformities or blockages in your nose or sinuses.  Abnormal growths in the nose (nasal polyps).  Pollutants, such as chemicals or irritants in the air.  Infection from fungi (rare). What increases the risk? You are more likely to develop this condition if you:  Have a weak body defense system (immune system).  Do a lot of swimming or diving.  Overuse nasal sprays.  Smoke. What are the signs or symptoms? The main symptoms of this condition are pain and a feeling of pressure around the affected sinuses. Other symptoms include:  Stuffy nose or congestion.  Thick drainage from your nose.  Swelling and warmth over the affected sinuses.  Headache.  Upper toothache.  A cough that may get worse at night.  Extra mucus that collects in the throat or the back of the nose (postnasal drip).  Decreased sense of smell and taste.  Fatigue.  A fever.  Sore throat.  Bad breath. How is this diagnosed? This condition is diagnosed based on:  Your symptoms.  Your medical history.  A physical exam.  Tests to find out if your condition is  acute or chronic. This may include: ? Checking your nose for nasal polyps. ? Viewing your sinuses using a device that has a light (endoscope). ? Testing for allergies or bacteria. ? Imaging tests, such as an MRI or CT scan. In rare cases, a bone biopsy may be done to rule out more serious types of fungal sinus disease. How is this treated? Treatment for sinusitis depends on the cause and whether your condition is chronic or acute.  If caused by a virus, your symptoms should go away on their own within 10 days. You may be given medicines to relieve symptoms. They include: ? Medicines that shrink swollen nasal passages (topical intranasal decongestants). ? Medicines that treat allergies (antihistamines). ? A spray that eases inflammation of the nostrils (topical intranasal corticosteroids). ? Rinses that help get rid of thick mucus in your nose (nasal saline washes).  If caused by bacteria, your health care provider may recommend waiting to see if your symptoms improve. Most bacterial infections will get better without antibiotic medicine. You may be given antibiotics if you have: ? A severe infection. ? A weak immune system.  If caused by narrow nasal passages or nasal polyps, you may need to have surgery. Follow these instructions at home: Medicines  Take, use, or apply over-the-counter and prescription medicines only as told by your health care provider. These may include nasal sprays.  If you were prescribed an antibiotic medicine, take it as told by your health care provider. Do not stop taking the antibiotic even if you start   to feel better. Hydrate and humidify  Drink enough fluid to keep your urine pale yellow. Staying hydrated will help to thin your mucus.  Use a cool mist humidifier to keep the humidity level in your home above 50%.  Inhale steam for 10-15 minutes, 3-4 times a day, or as told by your health care provider. You can do this in the bathroom while a hot shower is  running.  Limit your exposure to cool or dry air.   Rest  Rest as much as possible.  Sleep with your head raised (elevated).  Make sure you get enough sleep each night. General instructions  Apply a warm, moist washcloth to your face 3-4 times a day or as told by your health care provider. This will help with discomfort.  Wash your hands often with soap and water to reduce your exposure to germs. If soap and water are not available, use hand sanitizer.  Do not smoke. Avoid being around people who are smoking (secondhand smoke).  Keep all follow-up visits as told by your health care provider. This is important.   Contact a health care provider if:  You have a fever.  Your symptoms get worse.  Your symptoms do not improve within 10 days. Get help right away if:  You have a severe headache.  You have persistent vomiting.  You have severe pain or swelling around your face or eyes.  You have vision problems.  You develop confusion.  Your neck is stiff.  You have trouble breathing. Summary  Sinusitis is soreness and inflammation of your sinuses. Sinuses are hollow spaces in the bones around your face.  This condition is caused by nasal tissues that become inflamed or swollen. The swelling traps or blocks the flow of mucus. This allows bacteria, viruses, and fungi to grow, which leads to infection.  If you were prescribed an antibiotic medicine, take it as told by your health care provider. Do not stop taking the antibiotic even if you start to feel better.  Keep all follow-up visits as told by your health care provider. This is important. This information is not intended to replace advice given to you by your health care provider. Make sure you discuss any questions you have with your health care provider. Document Revised: 01/11/2018 Document Reviewed: 01/11/2018 Elsevier Patient Education  2021 Elsevier Inc.  

## 2021-01-23 ENCOUNTER — Encounter: Payer: Self-pay | Admitting: Family

## 2021-01-23 ENCOUNTER — Ambulatory Visit (INDEPENDENT_AMBULATORY_CARE_PROVIDER_SITE_OTHER): Payer: Medicare HMO | Admitting: Family

## 2021-01-23 ENCOUNTER — Other Ambulatory Visit: Payer: Self-pay

## 2021-01-23 VITALS — BP 117/67 | HR 71 | Temp 97.1°F | Ht 61.0 in | Wt 169.0 lb

## 2021-01-23 DIAGNOSIS — E039 Hypothyroidism, unspecified: Secondary | ICD-10-CM | POA: Diagnosis not present

## 2021-01-23 DIAGNOSIS — F1320 Sedative, hypnotic or anxiolytic dependence, uncomplicated: Secondary | ICD-10-CM

## 2021-01-23 DIAGNOSIS — Z79899 Other long term (current) drug therapy: Secondary | ICD-10-CM | POA: Diagnosis not present

## 2021-01-23 DIAGNOSIS — K219 Gastro-esophageal reflux disease without esophagitis: Secondary | ICD-10-CM | POA: Diagnosis not present

## 2021-01-23 DIAGNOSIS — I159 Secondary hypertension, unspecified: Secondary | ICD-10-CM | POA: Diagnosis not present

## 2021-01-23 DIAGNOSIS — I10 Essential (primary) hypertension: Secondary | ICD-10-CM

## 2021-01-23 DIAGNOSIS — F411 Generalized anxiety disorder: Secondary | ICD-10-CM

## 2021-01-23 DIAGNOSIS — E559 Vitamin D deficiency, unspecified: Secondary | ICD-10-CM

## 2021-01-23 DIAGNOSIS — E785 Hyperlipidemia, unspecified: Secondary | ICD-10-CM

## 2021-01-23 DIAGNOSIS — E66811 Obesity, class 1: Secondary | ICD-10-CM

## 2021-01-23 DIAGNOSIS — E669 Obesity, unspecified: Secondary | ICD-10-CM | POA: Diagnosis not present

## 2021-01-23 MED ORDER — LEVOTHYROXINE SODIUM 50 MCG PO TABS: 50.0000 ug | ORAL_TABLET | Freq: Every day | ORAL | 1 refills | Status: DC

## 2021-01-23 MED ORDER — PANTOPRAZOLE SODIUM 20 MG PO TBEC: 20.0000 mg | DELAYED_RELEASE_TABLET | Freq: Two times a day (BID) | ORAL | 2 refills | Status: DC

## 2021-01-23 MED ORDER — IRBESARTAN 75 MG PO TABS: 75.0000 mg | ORAL_TABLET | Freq: Every day | ORAL | 0 refills | Status: DC

## 2021-01-23 MED ORDER — ALPRAZOLAM 0.25 MG PO TABS
ORAL_TABLET | ORAL | 2 refills | Status: DC
Start: 2021-01-23 — End: 2021-05-07

## 2021-01-23 MED ORDER — ATORVASTATIN CALCIUM 20 MG PO TABS: 1.0000 | ORAL_TABLET | Freq: Every day | ORAL | 1 refills | Status: DC

## 2021-01-23 NOTE — Addendum Note (Signed)
Addended by: Brynda Peon F on: 01/23/2021 11:18 AM   Modules accepted: Orders

## 2021-01-23 NOTE — Progress Notes (Signed)
Subjective:    Patient ID: Susan Contreras, female    DOB: February 04, 1941, 80 y.o.   MRN: [id number]  Chief Complaint  Patient presents with  . Hypertension  . Sinus Problem    On meds done televisit    Pt presents to the office today for chronic follow up. She is followed by GI every 6 months. Hypertension This is a chronic problem. The current episode started more than 1 year ago. The problem has been resolved since onset. The problem is controlled. Associated symptoms include anxiety. Pertinent negatives include no malaise/fatigue, peripheral edema or shortness of breath. Risk factors for coronary artery disease include dyslipidemia and sedentary lifestyle. The current treatment provides moderate improvement. Identifiable causes of hypertension include a thyroid problem.  Sinus Problem Pertinent negatives include no shortness of breath.  Gastroesophageal Reflux She complains of belching and heartburn. This is a chronic problem. The current episode started more than 1 year ago. The problem occurs occasionally. The problem has been waxing and waning. Associated symptoms include fatigue. She has tried a PPI for the symptoms. The treatment provided moderate relief.  Thyroid Problem Presents for follow-up visit. Symptoms include anxiety, depressed mood and fatigue. Patient reports no constipation or diarrhea. The symptoms have been stable. Her past medical history is significant for hyperlipidemia.  Hyperlipidemia This is a chronic problem. The current episode started more than 1 year ago. The problem is controlled. Recent lipid tests were reviewed and are normal. Pertinent negatives include no shortness of breath. Current antihyperlipidemic treatment includes statins. The current treatment provides moderate improvement of lipids. Risk factors for coronary artery disease include diabetes mellitus, hypertension and a sedentary lifestyle.  Anxiety Presents for follow-up visit. Symptoms include  depressed mood, excessive worry, hyperventilation, irritability, nervous/anxious behavior and restlessness. Patient reports no shortness of breath. Symptoms occur occasionally. The severity of symptoms is moderate.        Review of Systems  Constitutional: Positive for fatigue and irritability. Negative for malaise/fatigue.  Respiratory: Negative for shortness of breath.   Gastrointestinal: Positive for heartburn. Negative for constipation and diarrhea.  Psychiatric/Behavioral: The patient is nervous/anxious.   All other systems reviewed and are negative.      Objective:   Physical Exam Vitals reviewed.  Constitutional:      General: She is not in acute distress.    Appearance: She is well-developed.  HENT:     Head: Normocephalic and atraumatic.     Right Ear: Tympanic membrane normal.     Left Ear: Tympanic membrane normal.  Eyes:     Pupils: Pupils are equal, round, and reactive to light.  Neck:     Thyroid: No thyromegaly.  Cardiovascular:     Rate and Rhythm: Normal rate and regular rhythm.     Heart sounds: Normal heart sounds. No murmur heard.   Pulmonary:     Effort: Pulmonary effort is normal. No respiratory distress.     Breath sounds: Normal breath sounds. No wheezing.  Abdominal:     General: Bowel sounds are normal. There is no distension.     Palpations: Abdomen is soft.     Tenderness: There is no abdominal tenderness.  Musculoskeletal:        General: No tenderness. Normal range of motion.     Cervical back: Normal range of motion and neck supple.  Skin:    General: Skin is warm and dry.  Neurological:     Mental Status: She is alert and oriented to person, place, and time.  Cranial Nerves: No cranial nerve deficit.     Deep Tendon Reflexes: Reflexes are normal and symmetric.  Psychiatric:        Behavior: Behavior normal.        Thought Content: Thought content normal.        Judgment: Judgment normal.       BP 117/67   Pulse 71   Temp  (!) 97.1 F (36.2 C) (Temporal)   Ht 5' 1"  (1.549 m)   Wt 169 lb (76.7 kg)   SpO2 97%   BMI 31.93 kg/m      Assessment & Plan:  Susan Contreras comes in today with chief complaint of Hypertension and Sinus Problem (On meds done televisit )   Diagnosis and orders addressed:  1. GAD (generalized anxiety disorder) - ALPRAZolam (XANAX) 0.25 MG tablet; TAKE 1 TABLET TWICE DAILY AS NEEDED FOR ANXIETY  Dispense: 60 tablet; Refill: 2 - CMP14+EGFR - CBC with Differential/Platelet  2. Controlled substance agreement signed - ALPRAZolam (XANAX) 0.25 MG tablet; TAKE 1 TABLET TWICE DAILY AS NEEDED FOR ANXIETY  Dispense: 60 tablet; Refill: 2 - CMP14+EGFR - CBC with Differential/Platelet  3. Benzodiazepine dependence ([facility]) - ALPRAZolam (XANAX) 0.25 MG tablet; TAKE 1 TABLET TWICE DAILY AS NEEDED FOR ANXIETY  Dispense: 60 tablet; Refill: 2 - CMP14+EGFR - CBC with Differential/Platelet  4. Essential hypertension - irbesartan (AVAPRO) 75 MG tablet; Take 1 tablet (75 mg total) by mouth daily. (NEEDS TO BE SEEN BEFORE NEXT REFILL)  Dispense: 90 tablet; Refill: 0 - CMP14+EGFR - CBC with Differential/Platelet  5. Secondary hypertension - CMP14+EGFR - CBC with Differential/Platelet  6. Gastroesophageal reflux disease, unspecified whether esophagitis present - pantoprazole (PROTONIX) 20 MG tablet; Take 1 tablet (20 mg total) by mouth 2 (two) times daily before a meal.  Dispense: 180 tablet; Refill: 2 - CMP14+EGFR - CBC with Differential/Platelet  7. Hypothyroidism, unspecified type - levothyroxine (SYNTHROID) 50 MCG tablet; Take 1 tablet (50 mcg total) by mouth daily.  Dispense: 90 tablet; Refill: 1 - CMP14+EGFR - CBC with Differential/Platelet - TSH  8. Hyperlipidemia, unspecified hyperlipidemia type - atorvastatin (LIPITOR) 20 MG tablet; Take 1 tablet (20 mg total) by mouth daily.  Dispense: 90 tablet; Refill: 1 - CMP14+EGFR - CBC with Differential/Platelet  9. Obesity (BMI 30.0-34.9) -  CMP14+EGFR - CBC with Differential/Platelet  10. Vitamin D deficiency - CMP14+EGFR - CBC with Differential/Platelet   Labs pending Patient reviewed in [place] controlled database, no flags noted. Contract and drug screen up dated today.  Health Maintenance reviewed Diet and exercise encouraged  Follow up plan: 3 months    [name], FNP

## 2021-01-23 NOTE — Patient Instructions (Signed)
[email or web address]">  Generalized Anxiety Disorder, Adult Generalized anxiety disorder (GAD) is a mental health condition. Unlike normal worries, anxiety related to GAD is not triggered by a specific event. These worries do not fade or get better with time. GAD interferes with relationships, work, and school. GAD symptoms can vary from mild to severe. People with severe GAD can have intense waves of anxiety with physical symptoms that are similar to panic attacks. What are the causes? The exact cause of GAD is not known, but the following are believed to have an impact:  Differences in natural brain chemicals.  Genes passed down from parents to children.  Differences in the way threats are perceived.  Development during childhood.  Personality. What increases the risk? The following factors may make you more likely to develop this condition:  Being female.  Having a family history of anxiety disorders.  Being very shy.  Experiencing very stressful life events, such as the death of a loved one.  Having a very stressful family environment. What are the signs or symptoms? People with GAD often worry excessively about many things in their lives, such as their health and family. Symptoms may also include:  Mental and emotional symptoms: ? Worrying excessively about natural disasters. ? Fear of being late. ? Difficulty concentrating. ? Fears that others are judging your performance.  Physical symptoms: ? Fatigue. ? Headaches, muscle tension, muscle twitches, trembling, or feeling shaky. ? Feeling like your heart is pounding or beating very fast. ? Feeling out of breath or like you cannot take a deep breath. ? Having trouble falling asleep or staying asleep, or experiencing restlessness. ? Sweating. ? Nausea, diarrhea, or irritable bowel syndrome (IBS).  Behavioral symptoms: ? Experiencing erratic moods or irritability. ? Avoidance of new situations. ? Avoidance of  people. ? Extreme difficulty making decisions. How is this diagnosed? This condition is diagnosed based on your symptoms and medical history. You will also have a physical exam. Your health care provider may perform tests to rule out other possible causes of your symptoms. To be diagnosed with GAD, a person must have anxiety that:  Is out of his or her control.  Affects several different aspects of his or her life, such as work and relationships.  Causes distress that makes him or her unable to take part in normal activities.  Includes at least three symptoms of GAD, such as restlessness, fatigue, trouble concentrating, irritability, muscle tension, or sleep problems. Before your health care provider can confirm a diagnosis of GAD, these symptoms must be present more days than they are not, and they must last for 6 months or longer. How is this treated? This condition may be treated with:  Medicine. Antidepressant medicine is usually prescribed for long-term daily control. Anti-anxiety medicines may be added in severe cases, especially when panic attacks occur.  Talk therapy (psychotherapy). Certain types of talk therapy can be helpful in treating GAD by providing support, education, and guidance. Options include: ? Cognitive behavioral therapy (CBT). People learn coping skills and self-calming techniques to ease their physical symptoms. They learn to identify unrealistic thoughts and behaviors and to replace them with more appropriate thoughts and behaviors. ? Acceptance and commitment therapy (ACT). This treatment teaches people how to be mindful as a way to cope with unwanted thoughts and feelings. ? Biofeedback. This process trains you to manage your body's response (physiological response) through breathing techniques and relaxation methods. You will work with a therapist while machines are used to monitor your physical   symptoms.  Stress management techniques. These include yoga,  meditation, and exercise. A mental health specialist can help determine which treatment is best for you. Some people see improvement with one type of therapy. However, other people require a combination of therapies.   Follow these instructions at home: Lifestyle  Maintain a consistent routine and schedule.  Anticipate stressful situations. Create a plan, and allow extra time to work with your plan.  Practice stress management or self-calming techniques that you have learned from your therapist or your health care provider. General instructions  Take over-the-counter and prescription medicines only as told by your health care provider.  Understand that you are likely to have setbacks. Accept this and be kind to yourself as you persist to take better care of yourself.  Recognize and accept your accomplishments, even if you judge them as small.  Keep all follow-up visits as told by your health care provider. This is important. Contact a health care provider if:  Your symptoms do not get better.  Your symptoms get worse.  You have signs of depression, such as: ? A persistently sad or irritable mood. ? Loss of enjoyment in activities that used to bring you joy. ? Change in weight or eating. ? Changes in sleeping habits. ? Avoiding friends or family members. ? Loss of energy for normal tasks. ? Feelings of guilt or worthlessness. Get help right away if:  You have serious thoughts about hurting yourself or others. If you ever feel like you may hurt yourself or others, or have thoughts about taking your own life, get help right away. Go to your nearest emergency department or:  Call your local emergency services (911 in the [place]  Call a suicide crisis helpline, such as the National Suicide Prevention Lifeline at 1-800-273-8255. This is open 24 hours a day in the [place]  Text the Crisis Text Line at 741741 (in the [place] Summary  Generalized anxiety disorder (GAD) is a mental  health condition that involves worry that is not triggered by a specific event.  People with GAD often worry excessively about many things in their lives, such as their health and family.  GAD may cause symptoms such as restlessness, trouble concentrating, sleep problems, frequent sweating, nausea, diarrhea, headaches, and trembling or muscle twitching.  A mental health specialist can help determine which treatment is best for you. Some people see improvement with one type of therapy. However, other people require a combination of therapies. This information is not intended to replace advice given to you by your health care provider. Make sure you discuss any questions you have with your health care provider. Document Revised: 06/01/2019 Document Reviewed: 06/01/2019 Elsevier Patient Education  2021 Elsevier Inc.  

## 2021-01-24 LAB — CBC WITH DIFFERENTIAL/PLATELET
Basophils Absolute: 0.1 10*3/uL (ref 0.0–0.2)
Basos: 1 %
EOS (ABSOLUTE): 0.3 10*3/uL (ref 0.0–0.4)
Eos: 3 %
Hematocrit: 39 % (ref 34.0–46.6)
Hemoglobin: 12.9 g/dL (ref 11.1–15.9)
Immature Grans (Abs): 0 10*3/uL (ref 0.0–0.1)
Immature Granulocytes: 0 %
Lymphocytes Absolute: 3.2 10*3/uL — ABNORMAL HIGH (ref 0.7–3.1)
Lymphs: 34 %
MCH: 29.3 pg (ref 26.6–33.0)
MCHC: 33.1 g/dL (ref 31.5–35.7)
MCV: 89 fL (ref 79–97)
Monocytes Absolute: 0.7 10*3/uL (ref 0.1–0.9)
Monocytes: 7 %
Neutrophils Absolute: 5.2 10*3/uL (ref 1.4–7.0)
Neutrophils: 55 %
Platelets: 306 10*3/uL (ref 150–450)
RBC: 4.4 x10E6/uL (ref 3.77–5.28)
RDW: 13.1 % (ref 11.7–15.4)
WBC: 9.5 10*3/uL (ref 3.4–10.8)

## 2021-01-24 LAB — CMP14+EGFR
ALT: 11 IU/L (ref 0–32)
AST: 15 IU/L (ref 0–40)
Albumin/Globulin Ratio: 1.7 (ref 1.2–2.2)
Albumin: 4.8 g/dL — ABNORMAL HIGH (ref 3.7–4.7)
Alkaline Phosphatase: 82 IU/L (ref 44–121)
BUN/Creatinine Ratio: 8 — ABNORMAL LOW (ref 12–28)
BUN: 11 mg/dL (ref 8–27)
Bilirubin Total: 0.6 mg/dL (ref 0.0–1.2)
CO2: 23 mmol/L (ref 20–29)
Calcium: 9.8 mg/dL (ref 8.7–10.3)
Chloride: 98 mmol/L (ref 96–106)
Creatinine, Ser: 1.38 mg/dL — ABNORMAL HIGH (ref 0.57–1.00)
Globulin, Total: 2.8 g/dL (ref 1.5–4.5)
Glucose: 80 mg/dL (ref 65–99)
Potassium: 4.4 mmol/L (ref 3.5–5.2)
Sodium: 136 mmol/L (ref 134–144)
Total Protein: 7.6 g/dL (ref 6.0–8.5)
eGFR: 39 mL/min/{1.73_m2} — ABNORMAL LOW (ref >59)

## 2021-01-24 LAB — TSH: TSH: 3.6 u[IU]/mL (ref 0.450–4.500)

## 2021-01-25 ENCOUNTER — Other Ambulatory Visit: Payer: Self-pay | Admitting: Family

## 2021-01-27 LAB — DRUG SCREEN 10 W/CONF, SERUM
Amphetamines, IA: NEGATIVE ng/mL
Barbiturates, IA: NEGATIVE ug/mL
Benzodiazepines, IA: NEGATIVE ng/mL
Cocaine & Metabolite, IA: NEGATIVE ng/mL
Methadone, IA: NEGATIVE ng/mL
Opiates, IA: NEGATIVE ng/mL
Oxycodones, IA: NEGATIVE ng/mL
Phencyclidine, IA: NEGATIVE ng/mL
Propoxyphene, IA: NEGATIVE ng/mL
THC(Marijuana) Metabolite, IA: NEGATIVE ng/mL

## 2021-01-30 ENCOUNTER — Telehealth: Payer: Self-pay

## 2021-01-30 NOTE — Telephone Encounter (Signed)
Patient called stating she fell [place] [employer] yesterday.  States she landed over milk cartons and did not hit her head.  States she is having some soreness in her ribs and shoulders due to the fall.  Advised patient to come in to be seen but patient states that she thinks she is fine.  Will use tylenol and let us know tomorrow if she needs to come in.

## 2021-01-31 ENCOUNTER — Telehealth: Payer: Medicare HMO

## 2021-03-18 ENCOUNTER — Encounter: Payer: Self-pay | Admitting: Family

## 2021-03-18 ENCOUNTER — Ambulatory Visit (INDEPENDENT_AMBULATORY_CARE_PROVIDER_SITE_OTHER): Payer: Medicare HMO | Admitting: Family

## 2021-03-18 ENCOUNTER — Telehealth: Payer: Self-pay | Admitting: Family

## 2021-03-18 ENCOUNTER — Ambulatory Visit (INDEPENDENT_AMBULATORY_CARE_PROVIDER_SITE_OTHER): Payer: Medicare HMO | Admitting: Licensed Clinical Social Worker

## 2021-03-18 DIAGNOSIS — F320 Major depressive disorder, single episode, mild: Secondary | ICD-10-CM | POA: Diagnosis not present

## 2021-03-18 DIAGNOSIS — J019 Acute sinusitis, unspecified: Secondary | ICD-10-CM | POA: Diagnosis not present

## 2021-03-18 DIAGNOSIS — E039 Hypothyroidism, unspecified: Secondary | ICD-10-CM | POA: Diagnosis not present

## 2021-03-18 DIAGNOSIS — I10 Essential (primary) hypertension: Secondary | ICD-10-CM

## 2021-03-18 DIAGNOSIS — F411 Generalized anxiety disorder: Secondary | ICD-10-CM

## 2021-03-18 DIAGNOSIS — E785 Hyperlipidemia, unspecified: Secondary | ICD-10-CM

## 2021-03-18 DIAGNOSIS — K219 Gastro-esophageal reflux disease without esophagitis: Secondary | ICD-10-CM

## 2021-03-18 MED ORDER — FLUTICASONE PROPIONATE 50 MCG/ACT NA SUSP: 1.0000 | Freq: Every day | NASAL | 0 refills | Status: DC

## 2021-03-18 MED ORDER — AMOXICILLIN 500 MG PO CAPS: 500.0000 mg | ORAL_CAPSULE | Freq: Three times a day (TID) | ORAL | 0 refills | Status: AC

## 2021-03-18 NOTE — Chronic Care Management (AMB) (Signed)
Chronic Care Management    Clinical Social Work Note  03/18/2021 Name: Susan Contreras MRN: [id number] DOB: 03-21-1941  Susan Contreras is a 80 y.o. year old female who is a primary care patient of [name], FNP. The CCM team was consulted to assist the patient with chronic disease management and/or care coordination needs related to: [name] .   Engaged with patient by telephone for follow up visit in response to provider referral for social work chronic care management and care coordination services.   Consent to Services:  The patient was given information about Chronic Care Management services, agreed to services, and gave verbal consent prior to initiation of services.  Please see initial visit note for detailed documentation.   Patient agreed to services and consent obtained.   Assessment: Review of patient past medical history, allergies, medications, and health status, including review of relevant consultants reports was performed today as part of a comprehensive evaluation and provision of chronic care management and care coordination services.     SDOH (Social Determinants of Health) assessments and interventions performed:  SDOH Interventions    Flowsheet Row Most Recent Value  SDOH Interventions   Depression Interventions/Treatment  Currently on Treatment        Advanced Directives Status: See Vyn[employer] application for related entries.  CCM Care Plan  Allergies  Allergen Reactions   Biaxin [Clarithromycin]    Cephalexin    Ciprofloxacin    Doxycycline     Headaches   Flagyl [Metronidazole Hcl]    Ketek [Telithromycin]    Sulfa Antibiotics     Outpatient Encounter Medications as of 03/18/2021  Medication Sig   ALPRAZolam (XANAX) 0.25 MG tablet TAKE 1 TABLET TWICE DAILY AS NEEDED FOR ANXIETY   atorvastatin (LIPITOR) 20 MG tablet Take 1 tablet (20 mg total) by mouth daily.   Chlorphen-PE-Acetaminophen (NOREL AD PO) Take 1 tablet by mouth as needed.     Cholecalciferol (VITAMIN D3) 125 MCG (5000 UT) CAPS Take 1 capsule (5,000 Units total) by mouth daily.   fluticasone (FLONASE) 50 MCG/ACT nasal spray USE 1 SPRAY IN EACH NOSTRIL ONCE DAILY.   irbesartan (AVAPRO) 75 MG tablet Take 1 tablet (75 mg total) by mouth daily. (NEEDS TO BE SEEN BEFORE NEXT REFILL)   levothyroxine (SYNTHROID) 50 MCG tablet Take 1 tablet (50 mcg total) by mouth daily.   meclizine (ANTIVERT) 25 MG tablet TAKE ONE TABLET BY MOUTH THREE TIMES DAILY AS NEEDED FOR DIZZINESS   pantoprazole (PROTONIX) 20 MG tablet Take 1 tablet (20 mg total) by mouth 2 (two) times daily before a meal.   No facility-administered encounter medications on file as of 03/18/2021.    Patient Active Problem List   Diagnosis Date Noted   History of colonic polyps 04/26/2020   Benzodiazepine dependence ([place]) 05/19/2018   Controlled substance agreement signed 05/19/2018   Obesity (BMI 30.0-34.9) [id number]   Metabolic syndrome [id number]   GAD (generalized anxiety disorder) 08/14/2015   Allergic rhinitis 08/14/2015   GERD (gastroesophageal reflux disease) 08/14/2015   Hyperlipidemia 08/14/2015   Vitamin D deficiency 08/14/2015   Hypothyroidism 05/29/2014   Cholelithiasis 01/15/2011   Family history of colon cancer 01/15/2011   Hypertension 11/11/2007   ESOPHAGEAL STRICTURE 11/11/2007   Diaphragmatic hernia 11/11/2007   DIVERTICULOSIS OF COLON 11/11/2007    Conditions to be addressed/monitored: monitor client management of anxiety issues faced and management of stress issues faced   Care Plan : General Social Work (Adult)  Updates made by Katha Cabal, LCSW  since 03/18/2021 12:00 AM     Problem: Emotional Distress      Goal: Client needs help in managing anxiety and stress issues faced   Start Date: 03/18/2021  Expected End Date: 06/11/2021  This Visit's Progress: On track  Recent Progress: On track  Priority: Medium  Note:   Current Barriers:  Chronic Mental Health needs  related to anxiety and stress issues faced Limited social support and Mental Health Concerns  Suicidal Ideation/Homicidal Ideation: No  Clinical Social Work Goal(s):  patient will work with SW monthly by telephone or in person to reduce or manage symptoms related to anxiety or stress issues faced patient will work with SW monthly to address concerns related to ongoing in home care needs of her spouse  Interventions: 1:1 collaboration with [name], FNP regarding development and update of comprehensive plan of care as evidenced by provider attestation and co-signature  Talked with client about headaches of client (she spoke of sinus issues) Provided mental health counseling with regard to anxiety or stress symptoms management Talked with client about ongoing care needs of her spouse Talked with client about care needs of sister of client (client helps her sister to buy groceries ) Talked with client about transport needs of client Talked with client about sleeping issues of client Talked with client about mood of client (client said she feels anxious occasionally; she said she is concerned over care needs of her spouse and concerned over care needs of her sister) Talked with client about medication procurement for client Talked with client about appetite of client Talked with client about upcoming client appointments Talked with client about self care (allow time to rest and relax, allow time to do hobbies, eat meals on schedule, try to get adequate sleep at night) Talked with client about support from [facility] for nursing needs of client Talked with client about CCM program support  Patient Self Care Activities:  Self administers medications as prescribed Attends all scheduled provider appointments Calls provider office for new concerns or questions  Patient Coping Strengths:  Supportive Relationships Family Friends  Patient Self Care Deficits:  Anxiety or stress issues related  to managing ongoing chronic care needs of her spouse  Patient Goals:  - spend time or talk with others every day - practice relaxation or meditation daily - keep a calendar with appointment dates  Follow Up Plan: LCSW to call client on 04/25/21 to assess client needs     Norva Riffle.Gabi Mcfate MSW, LCSW Licensed Clinical Social Worker [facility] Care Management [phone]

## 2021-03-18 NOTE — Patient Instructions (Signed)
Visit Information  PATIENT GOALS:  Goals Addressed               This Visit's Progress     Manage My Emotions        Self-Management Plan Developed: Client wants to use strategies to manage daily stress or anxiety issues faced (pt-stated)        needs help in managing anxiety and stress issues faced     Timeframe: Short Term Goal   This Visit's Progress: On Track  Priority: Medium   Start Date: 03/18/21 Expected End Date  06/11/21  Follow up date: 04/25/21  Patient Self Care Activities:  Self administers medications as prescribed Attends all scheduled provider appointments Calls provider office for new concerns or questions  Patient Coping Strengths:  Supportive Relationships Family Friends  Patient Self Care Deficits:  Anxiety or stress issues related to managing ongoing chronic care needs of her spouse  Patient Goals:  - spend time or talk with others every day - practice relaxation or meditation daily - keep a calendar with appointment dates  Follow Up Plan: LCSW to call client on 04/25/21 to assess client needs        Norva Riffle.Charleen Madera MSW, LCSW Licensed Clinical Social Worker [facility] Care Management [phone]

## 2021-03-18 NOTE — Telephone Encounter (Signed)
Called and spoke with patient she wants an appt for sinus infection

## 2021-03-18 NOTE — Progress Notes (Signed)
Virtual Visit  Note Due to COVID-19 pandemic this visit was conducted virtually. This visit type was conducted due to national recommendations for restrictions regarding the COVID-19 Pandemic (e.g. social distancing, sheltering in place) in an effort to limit this patient's exposure and mitigate transmission in our community. All issues noted in this document were discussed and addressed.  A physical exam was not performed with this format.  I connected with Susan Contreras on 03/18/21 at 2:32 pm by telephone and verified that I am speaking with the correct person using two identifiers. Susan Contreras is currently located at home and no one is currently with her during visit. The provider, [name], FNP is located in their office at time of visit.  I discussed the limitations, risks, security and privacy concerns of performing an evaluation and management service by telephone and the availability of in person appointments. I also discussed with the patient that there may be a patient responsible charge related to this service. The patient expressed understanding and agreed to proceed.  Susan Contreras, Susan Contreras are scheduled for a virtual visit with your provider today.    Just as we do with appointments in the office, we must obtain your consent to participate.  Your consent will be active for this visit and any virtual visit you may have with one of our providers in the next 365 days.    If you have a MyChart account, I can also send a copy of this consent to you electronically.  All virtual visits are billed to your insurance company just like a traditional visit in the office.  As this is a virtual visit, video technology does not allow for your provider to perform a traditional examination.  This may limit your provider's ability to fully assess your condition.  If your provider identifies any concerns that need to be evaluated in person or the need to arrange testing such as labs, EKG, etc, we will make  arrangements to do so.    Although advances in technology are sophisticated, we cannot ensure that it will always work on either your end or our end.  If the connection with a video visit is poor, we may have to switch to a telephone visit.  With either a video or telephone visit, we are not always able to ensure that we have a secure connection.   I need to obtain your verbal consent now.   Are you willing to proceed with your visit today?   Susan Contreras has provided verbal consent on 03/18/2021 for a virtual visit (video or telephone).   [name], [place] 03/18/2021  2:34 PM      History and Present Illness:  Sinusitis This is a new problem. The current episode started in the past 7 days. The problem has been waxing and waning since onset. There has been no fever. Her pain is at a severity of 10/10. The pain is moderate. Associated symptoms include congestion, coughing, headaches, a hoarse voice, sinus pressure and sneezing. Pertinent negatives include no ear pain or sore throat. Treatments tried: flonase, norel ad. The treatment provided mild relief.     Review of Systems  HENT:  Positive for congestion, hoarse voice, sinus pressure and sneezing. Negative for ear pain and sore throat.   Respiratory:  Positive for cough.   Neurological:  Positive for headaches.    Observations/Objective: No SOB or distress noted, hoarse voice  Assessment and Plan: 1. Acute sinusitis, recurrence not specified, unspecified location - Take meds as  prescribed - Use a cool mist humidifier  -Use saline nose sprays frequently -Force fluids -For any cough or congestion  Use plain Mucinex- regular strength or max strength is fine -For fever or aces or pains- take tylenol or ibuprofen. -Throat lozenges if help -New toothbrush in 3 days Meds ordered this encounter  Medications   amoxicillin (AMOXIL) 500 MG capsule    Sig: Take 1 capsule (500 mg total) by mouth 3 (three) times daily for 10 days.     Dispense:  30 capsule    Refill:  0    Order Specific Question:   Supervising Provider    Answer:   Caryl Pina A [[id number]]   fluticasone (FLONASE) 50 MCG/ACT nasal spray    Sig: Place 1 spray into both nostrils daily.    Dispense:  16 g    Refill:  0    Order Specific Question:   Supervising Provider    Answer:   Caryl Pina A [id number]       I discussed the assessment and treatment plan with the patient. The patient was provided an opportunity to ask questions and all were answered. The patient agreed with the plan and demonstrated an understanding of the instructions.   The patient was advised to call back or seek an in-person evaluation if the symptoms worsen or if the condition fails to improve as anticipated.  The above assessment and management plan was discussed with the patient. The patient verbalized understanding of and has agreed to the management plan. Patient is aware to call the clinic if symptoms persist or worsen. Patient is aware when to return to the clinic for a follow-up visit. Patient educated on when it is appropriate to go to the emergency department.   Time call ended:  2:42 pm   I provided 12 minutes of  non face-to-face time during this encounter.    [name], FNP

## 2021-03-19 ENCOUNTER — Ambulatory Visit: Payer: Medicare HMO | Admitting: Family

## 2021-04-18 ENCOUNTER — Other Ambulatory Visit: Payer: Self-pay

## 2021-04-18 ENCOUNTER — Ambulatory Visit: Payer: Medicare HMO | Admitting: Physician Assistant

## 2021-04-18 ENCOUNTER — Encounter: Payer: Self-pay | Admitting: Physician Assistant

## 2021-04-18 DIAGNOSIS — L570 Actinic keratosis: Secondary | ICD-10-CM

## 2021-04-18 DIAGNOSIS — Z1283 Encounter for screening for malignant neoplasm of skin: Secondary | ICD-10-CM | POA: Diagnosis not present

## 2021-04-18 DIAGNOSIS — D485 Neoplasm of uncertain behavior of skin: Secondary | ICD-10-CM

## 2021-04-18 DIAGNOSIS — L820 Inflamed seborrheic keratosis: Secondary | ICD-10-CM | POA: Diagnosis not present

## 2021-04-25 ENCOUNTER — Ambulatory Visit (INDEPENDENT_AMBULATORY_CARE_PROVIDER_SITE_OTHER): Payer: Medicare HMO | Admitting: Licensed Clinical Social Worker

## 2021-04-25 DIAGNOSIS — E039 Hypothyroidism, unspecified: Secondary | ICD-10-CM

## 2021-04-25 DIAGNOSIS — F320 Major depressive disorder, single episode, mild: Secondary | ICD-10-CM

## 2021-04-25 DIAGNOSIS — F411 Generalized anxiety disorder: Secondary | ICD-10-CM

## 2021-04-25 DIAGNOSIS — E785 Hyperlipidemia, unspecified: Secondary | ICD-10-CM | POA: Diagnosis not present

## 2021-04-25 DIAGNOSIS — K219 Gastro-esophageal reflux disease without esophagitis: Secondary | ICD-10-CM

## 2021-04-25 DIAGNOSIS — I10 Essential (primary) hypertension: Secondary | ICD-10-CM | POA: Diagnosis not present

## 2021-04-25 NOTE — Patient Instructions (Signed)
Visit Information  PATIENT GOALS:  Goals Addressed               This Visit's Progress     Self-Management Plan Developed: Client wants to use strategies to manage daily stress or anxiety issues faced (pt-stated)        needs help in managing anxiety and stress issues faced     Timeframe: Short Term Goal   This Visit's Progress: On Track  Priority: Medium   Start Date: 04/25/21 Expected End Date  07/25/21  Follow up date: 06/06/21  Use strategies to manage daily stress and anxiety issues  Patient Self Care Activities:  Self administers medications as prescribed Attends all scheduled provider appointments Calls provider office for new concerns or questions  Patient Coping Strengths:  Supportive Relationships Family Friends  Patient Self Care Deficits:  Anxiety or stress issues related to managing ongoing chronic care needs of her spouse  Patient Goals:  - spend time or talk with others every day - practice relaxation or meditation daily - keep a calendar with appointment dates  Follow Up Plan: LCSW to call client on 06/06/21 to assess client needs       Norva Riffle.Aaisha Sliter MSW, LCSW Licensed Clinical Social Worker [facility] Care Management [phone]

## 2021-04-25 NOTE — Chronic Care Management (AMB) (Signed)
Chronic Care Management    Clinical Social Work Note  04/25/2021 Name: Susan Contreras MRN: [id number] DOB: [birth year]  Susan Contreras is a 80 y.o. year old female who is a primary care patient of Susan Balloon, FNP. The CCM team was consulted to assist the patient with chronic disease management and/or care coordination needs related to: [name] .   Engaged with patient by telephone for follow up visit in response to provider referral for social work chronic care management and care coordination services.   Consent to Services:  The patient was given information about Chronic Care Management services, agreed to services, and gave verbal consent prior to initiation of services.  Please see initial visit note for detailed documentation.   Patient agreed to services and consent obtained.   Assessment: Review of patient past medical history, allergies, medications, and health status, including review of relevant consultants reports was performed today as part of a comprehensive evaluation and provision of chronic care management and care coordination services.     SDOH (Social Determinants of Health) assessments and interventions performed:   PHQ-9 completed; Patient has Depression symptoms.She is currently on treatment for Depression.  She takes Xanax daily as prescribed Stress Issues:  Client has stress related to the medical needs of her spouse and related to medical care for her spouse.  Advanced Directives Status: See [employer]yn[employer] application for related entries.  CCM Care Plan  Allergies  Allergen Reactions   Biaxin [Clarithromycin]    Cephalexin    Ciprofloxacin    Doxycycline     Headaches   Flagyl [Metronidazole Hcl]    Ketek [Telithromycin]    Sulfa Antibiotics     Outpatient Encounter Medications as of 04/25/2021  Medication Sig   ALPRAZolam (XANAX) 0.25 MG tablet TAKE 1 TABLET TWICE DAILY AS NEEDED FOR ANXIETY   atorvastatin (LIPITOR) 20 MG tablet Take 1 tablet (20 mg  total) by mouth daily.   Chlorphen-PE-Acetaminophen (NOREL AD PO) Take 1 tablet by mouth as needed.    Cholecalciferol (VITAMIN D3) 125 MCG (5000 UT) CAPS Take 1 capsule (5,000 Units total) by mouth daily.   fluticasone (FLONASE) 50 MCG/ACT nasal spray Place 1 spray into both nostrils daily.   irbesartan (AVAPRO) 75 MG tablet Take 1 tablet (75 mg total) by mouth daily. (NEEDS TO BE SEEN BEFORE NEXT REFILL)   levothyroxine (SYNTHROID) 50 MCG tablet Take 1 tablet (50 mcg total) by mouth daily.   meclizine (ANTIVERT) 25 MG tablet TAKE ONE TABLET BY MOUTH THREE TIMES DAILY AS NEEDED FOR DIZZINESS   pantoprazole (PROTONIX) 20 MG tablet Take 1 tablet (20 mg total) by mouth 2 (two) times daily before a meal.   No facility-administered encounter medications on file as of 04/25/2021.    Patient Active Problem List   Diagnosis Date Noted   History of colonic polyps 04/26/2020   Benzodiazepine dependence ([place]) 05/19/2018   Controlled substance agreement signed 05/19/2018   Obesity (BMI 30.0-34.9) [id number]   Metabolic syndrome [id number]   GAD (generalized anxiety disorder) 08/14/2015   Allergic rhinitis 08/14/2015   GERD (gastroesophageal reflux disease) 08/14/2015   Hyperlipidemia 08/14/2015   Vitamin D deficiency 08/14/2015   Hypothyroidism 05/29/2014   Cholelithiasis 01/15/2011   Family history of colon cancer 01/15/2011   Hypertension 11/11/2007   ESOPHAGEAL STRICTURE 11/11/2007   Diaphragmatic hernia 11/11/2007   DIVERTICULOSIS OF COLON 11/11/2007    Conditions to be addressed/monitored:monitor client management of anxiety and stress issues  Care Plan : General Social Work (Adult)  Updates made by Susan Cabal, LCSW since 04/25/2021 12:00 AM     Problem: Emotional Distress      Goal: Client needs help in managing anxiety and stress issues faced   Start Date: 04/25/2021  Expected End Date: 07/25/2021  This Visit's Progress: On track  Recent Progress: On track  Priority:  Medium  Note:   Current Barriers:  Chronic Mental Health needs related to anxiety and stress issues faced Limited social support and Mental Health Concerns  Suicidal Ideation/Homicidal Ideation: No  Clinical Social Work Goal(s):  patient will work with SW monthly by telephone or in person to reduce or manage symptoms related to anxiety or stress issues faced patient will work with SW monthly to address concerns related to ongoing in home care needs of her spouse  Interventions: 1:1 collaboration with Susan Contreras, [place] regarding development and update of comprehensive plan of care as evidenced by provider attestation and co-signature  Talked with client about ongoing care needs of her spouse Talked with client about transport needs of client Talked with client about sleeping issues of client Talked with client about mood of client (client said she feels anxious occasionally; she said she is concerned over care needs of her spouse . She spoke of her use of prescribed Xanax and feels that Xanax is helping her as needed.  Talked with client about appetite of client Talked with client about upcoming client appointments Talked with client about support from [facility] for nursing needs of client Talked with client about pain issues of client. Talked with client about relaxation techniques (she likes to work outdoors, likes to do word search puzzles, watches some TV, likes to go shopping) Talked with [name] about family support from her sons  Patient Self Care Activities:  Self administers medications as prescribed Attends all scheduled provider appointments Calls provider office for new concerns or questions  Patient Coping Strengths:  Supportive Relationships Family Friends  Patient Self Care Deficits:  Anxiety or stress issues related to managing ongoing chronic care needs of her spouse  Patient Goals:  - spend time or talk with others every day - practice relaxation or meditation  daily - keep a calendar with appointment dates  Follow Up Plan: LCSW to call client on 06/06/21 to assess client needs      Susan Contreras.Tranika Scholler MSW, LCSW Licensed Clinical Social Worker [facility] Care Management [phone]

## 2021-04-30 ENCOUNTER — Telehealth: Payer: Self-pay | Admitting: Family

## 2021-04-30 NOTE — Telephone Encounter (Signed)
Patient aware and verbalized understanding. Needs follow up appointment will do phone visit this time will have to come in and be seen for regular follow ups

## 2021-04-30 NOTE — Telephone Encounter (Signed)
Pt has a question for Christy's nurse about medication.

## 2021-05-02 ENCOUNTER — Telehealth: Payer: Self-pay | Admitting: Physician Assistant

## 2021-05-02 NOTE — Telephone Encounter (Signed)
Path to patient isk no follow up needed

## 2021-05-02 NOTE — Telephone Encounter (Signed)
Results, KRS 

## 2021-05-06 ENCOUNTER — Encounter: Payer: Self-pay | Admitting: Physician Assistant

## 2021-05-06 NOTE — Progress Notes (Signed)
   New Patient   Subjective  Susan Contreras is a 80 y.o. female who presents for the following: Annual Exam (Lesion on left temple x months- scaly. No family or personal history of melanoma or non mole skin cancers. ).   The following portions of the chart were reviewed this encounter and updated as appropriate:  Tobacco  Allergies  Meds  Problems  Med Hx  Surg Hx  Fam Hx      Objective  Well appearing patient in no apparent distress; mood and affect are within normal limits.  A full examination was performed including scalp, head, eyes, ears, nose, lips, neck, chest, axillae, abdomen, back, buttocks, bilateral upper extremities, bilateral lower extremities, hands, feet, fingers, toes, fingernails, and toenails. All findings within normal limits unless otherwise noted below.  Left Temple Pinkish brown crust.        Right Supraclavicular Area Dark crust     Left Malar Cheek Erythematous patches with gritty scale.     Assessment & Plan  Neoplasm of uncertain behavior of skin (2) Left Temple  Skin / nail biopsy Type of biopsy: tangential   Informed consent: discussed and consent obtained   Timeout: patient name, date of birth, surgical site, and procedure verified   Procedure prep:  Patient was prepped and draped in usual sterile fashion (Non sterile) Prep type:  Chlorhexidine Anesthesia: the lesion was anesthetized in a standard fashion   Anesthetic:  1% lidocaine w/ epinephrine 1-100,000 local infiltration Instrument used: flexible razor blade   Outcome: patient tolerated procedure well   Post-procedure details: wound care instructions given    Specimen 1 - Surgical pathology Differential Diagnosis: r/o sk  Check Margins: No  Right Supraclavicular Area  Skin / nail biopsy Type of biopsy: tangential   Informed consent: discussed and consent obtained   Timeout: patient name, date of birth, surgical site, and procedure verified   Procedure prep:   Patient was prepped and draped in usual sterile fashion (Non sterile) Prep type:  Chlorhexidine Anesthesia: the lesion was anesthetized in a standard fashion   Anesthetic:  1% lidocaine w/ epinephrine 1-100,000 local infiltration Instrument used: flexible razor blade   Outcome: patient tolerated procedure well   Post-procedure details: wound care instructions given    Specimen 2 - Surgical pathology Differential Diagnosis: r/o sk  Check Margins: No  No measurements per [place].  AK (actinic keratosis) Left Malar Cheek  Destruction of lesion - Left Malar Cheek Complexity: simple   Destruction method: cryotherapy   Informed consent: discussed and consent obtained   Timeout:  patient name, date of birth, surgical site, and procedure verified Lesion destroyed using liquid nitrogen: Yes   Cryotherapy cycles:  3 Outcome: patient tolerated procedure well with no complications      I, Patterson Hollenbaugh, PA-C, have reviewed all documentation's for this visit.  The documentation on 05/06/21 for the exam, diagnosis, procedures and orders are all accurate and complete.

## 2021-05-07 ENCOUNTER — Ambulatory Visit (INDEPENDENT_AMBULATORY_CARE_PROVIDER_SITE_OTHER): Payer: Medicare HMO | Admitting: Family

## 2021-05-07 ENCOUNTER — Encounter: Payer: Self-pay | Admitting: Family

## 2021-05-07 DIAGNOSIS — I159 Secondary hypertension, unspecified: Secondary | ICD-10-CM | POA: Diagnosis not present

## 2021-05-07 DIAGNOSIS — G4700 Insomnia, unspecified: Secondary | ICD-10-CM | POA: Insufficient documentation

## 2021-05-07 DIAGNOSIS — Z79899 Other long term (current) drug therapy: Secondary | ICD-10-CM

## 2021-05-07 DIAGNOSIS — E669 Obesity, unspecified: Secondary | ICD-10-CM

## 2021-05-07 DIAGNOSIS — K219 Gastro-esophageal reflux disease without esophagitis: Secondary | ICD-10-CM

## 2021-05-07 DIAGNOSIS — E785 Hyperlipidemia, unspecified: Secondary | ICD-10-CM

## 2021-05-07 DIAGNOSIS — F411 Generalized anxiety disorder: Secondary | ICD-10-CM

## 2021-05-07 DIAGNOSIS — F1320 Sedative, hypnotic or anxiolytic dependence, uncomplicated: Secondary | ICD-10-CM

## 2021-05-07 DIAGNOSIS — E039 Hypothyroidism, unspecified: Secondary | ICD-10-CM | POA: Diagnosis not present

## 2021-05-07 MED ORDER — TRAZODONE HCL 50 MG PO TABS: 50.0000 mg | ORAL_TABLET | Freq: Every day | ORAL | 1 refills | Status: DC

## 2021-05-07 MED ORDER — ALPRAZOLAM 0.25 MG PO TABS: ORAL_TABLET | ORAL | 2 refills | Status: DC

## 2021-05-07 NOTE — Progress Notes (Signed)
Virtual Visit  Note Due to COVID-19 pandemic this visit was conducted virtually. This visit type was conducted due to national recommendations for restrictions regarding the COVID-19 Pandemic (e.g. social distancing, sheltering in place) in an effort to limit this patient's exposure and mitigate transmission in our community. All issues noted in this document were discussed and addressed.  A physical exam was not performed with this format.  I connected with Susan Contreras on 05/07/21 at 12:44 pm  by telephone and verified that I am speaking with the correct person using two identifiers. Susan Contreras is currently located at home and husband and sister is currently with her during visit. The provider, [name], FNP is located in their office at time of visit.  I discussed the limitations, risks, security and privacy concerns of performing an evaluation and management service by telephone and the availability of in person appointments. I also discussed with the patient that there may be a patient responsible charge related to this service. The patient expressed understanding and agreed to proceed.  [name], frances are scheduled for a virtual visit with your provider today.    Just as we do with appointments in the office, we must obtain your consent to participate.  Your consent will be active for this visit and any virtual visit you may have with one of our providers in the next 365 days.    If you have a MyChart account, I can also send a copy of this consent to you electronically.  All virtual visits are billed to your insurance company just like a traditional visit in the office.  As this is a virtual visit, video technology does not allow for your provider to perform a traditional examination.  This may limit your provider's ability to fully assess your condition.  If your provider identifies any concerns that need to be evaluated in person or the need to arrange testing such as labs, EKG, etc, we  will make arrangements to do so.    Although advances in technology are sophisticated, we cannot ensure that it will always work on either your end or our end.  If the connection with a video visit is poor, we may have to switch to a telephone visit.  With either a video or telephone visit, we are not always able to ensure that we have a secure connection.   I need to obtain your verbal consent now.   Are you willing to proceed with your visit today?   Susan Contreras has provided verbal consent on 05/07/2021 for a virtual visit (video or telephone).   [name], [place] 05/07/2021  12:46 PM   History and Present Illness:  Pt calls the office today for chronic follow up. She is followed by GI every 6 months.   Hypertension This is a chronic problem. The current episode started more than 1 year ago. The problem has been resolved since onset. The problem is controlled. Associated symptoms include anxiety. Pertinent negatives include no malaise/fatigue, peripheral edema or shortness of breath. Risk factors for coronary artery disease include dyslipidemia, obesity and sedentary lifestyle. The current treatment provides moderate improvement. Identifiable causes of hypertension include a thyroid problem.  Hyperlipidemia The current episode started more than 1 year ago. She has no history of obesity. Pertinent negatives include no shortness of breath. Current antihyperlipidemic treatment includes statins. The current treatment provides moderate improvement of lipids. Risk factors for coronary artery disease include dyslipidemia, female sex, hypertension and a sedentary lifestyle.  Gastroesophageal Reflux  She complains of belching and heartburn. This is a chronic problem. The current episode started more than 1 year ago. The problem occurs frequently. The problem has been waxing and waning. Associated symptoms include fatigue. Risk factors include obesity. She has tried a PPI for the symptoms. The treatment  provided moderate relief.  Thyroid Problem Presents for follow-up visit. Symptoms include anxiety, constipation, depressed mood and fatigue. Patient reports no dry skin. The symptoms have been stable. Her past medical history is significant for hyperlipidemia.  Anxiety Presents for follow-up visit. Symptoms include depressed mood, excessive worry, insomnia, irritability and nervous/anxious behavior. Patient reports no shortness of breath. Symptoms occur most days. The severity of symptoms is moderate.    Depression        This is a chronic problem.  The current episode started more than 1 year ago.   The onset quality is gradual.   The problem occurs intermittently.The problem is unchanged.  Associated symptoms include fatigue, insomnia, irritable and sad.  Associated symptoms include no helplessness and no hopelessness.  Past medical history includes thyroid problem and anxiety.   Insomnia Primary symptoms: difficulty falling asleep, frequent awakening, no malaise/fatigue.   The current episode started more than one year. The onset quality is gradual. The problem occurs intermittently. PMH includes: depression.      Review of Systems  Constitutional:  Positive for fatigue and irritability. Negative for malaise/fatigue.  Respiratory:  Negative for shortness of breath.   Gastrointestinal:  Positive for constipation and heartburn.  Psychiatric/Behavioral:  Positive for depression. The patient is nervous/anxious and has insomnia.   All other systems reviewed and are negative.   Observations/Objective:anxious No SOB or distress noted,   Assessment and Plan: Susan Contreras comes in today with chief complaint of No chief complaint on file.   Diagnosis and orders addressed:  1. Secondary hypertension  2. Gastroesophageal reflux disease, unspecified whether esophagitis present  3. Hypothyroidism, unspecified type  4. Obesity (BMI 30.0-34.9)  5. Controlled substance agreement signed -  ALPRAZolam (XANAX) 0.25 MG tablet; TAKE 1 TABLET TWICE DAILY AS NEEDED FOR ANXIETY  Dispense: 60 tablet; Refill: 2  6. Benzodiazepine dependence ([facility]) - ALPRAZolam (XANAX) 0.25 MG tablet; TAKE 1 TABLET TWICE DAILY AS NEEDED FOR ANXIETY  Dispense: 60 tablet; Refill: 2  7. GAD (generalized anxiety disorder) - ALPRAZolam (XANAX) 0.25 MG tablet; TAKE 1 TABLET TWICE DAILY AS NEEDED FOR ANXIETY  Dispense: 60 tablet; Refill: 2  8. Hyperlipidemia, unspecified hyperlipidemia type  9. Insomnia, unspecified type Start trazodone 50 mg  Sleep ritual discussed  - traZODone (DESYREL) 50 MG tablet; Take 1 tablet (50 mg total) by mouth at bedtime.  Dispense: 90 tablet; Refill: 1  Patient reviewed in [place] controlled database, no flags noted. Contract and drug screen are up to date.  Health Maintenance reviewed Diet and exercise encouraged  Follow up plan: 3 months     I discussed the assessment and treatment plan with the patient. The patient was provided an opportunity to ask questions and all were answered. The patient agreed with the plan and demonstrated an understanding of the instructions.   The patient was advised to call back or seek an in-person evaluation if the symptoms worsen or if the condition fails to improve as anticipated.  The above assessment and management plan was discussed with the patient. The patient verbalized understanding of and has agreed to the management plan. Patient is aware to call the clinic if symptoms persist or worsen. Patient is aware when to return  to the clinic for a follow-up visit. Patient educated on when it is appropriate to go to the emergency department.   Time call ended:  1:05 pm   I provided 21 minutes of  non face-to-face time during this encounter.    [name], FNP

## 2021-06-06 ENCOUNTER — Ambulatory Visit (INDEPENDENT_AMBULATORY_CARE_PROVIDER_SITE_OTHER): Payer: Medicare HMO | Admitting: Licensed Clinical Social Worker

## 2021-06-06 DIAGNOSIS — K219 Gastro-esophageal reflux disease without esophagitis: Secondary | ICD-10-CM

## 2021-06-06 DIAGNOSIS — F320 Major depressive disorder, single episode, mild: Secondary | ICD-10-CM

## 2021-06-06 DIAGNOSIS — F411 Generalized anxiety disorder: Secondary | ICD-10-CM

## 2021-06-06 DIAGNOSIS — I159 Secondary hypertension, unspecified: Secondary | ICD-10-CM

## 2021-06-06 DIAGNOSIS — E039 Hypothyroidism, unspecified: Secondary | ICD-10-CM

## 2021-06-06 DIAGNOSIS — E785 Hyperlipidemia, unspecified: Secondary | ICD-10-CM

## 2021-06-06 NOTE — Patient Instructions (Signed)
Visit Information  PATIENT GOALS:  Goals Addressed               This Visit's Progress     Self-Management Plan Developed: Client wants to use strategies to manage daily stress or anxiety issues faced (pt-stated)        needs help in managing anxiety and stress issues faced     Timeframe: Short Term Goal   This Visit's Progress: On Track  Priority: Medium   Start Date: 06/06/21  Expected End Date  08/30/21  Follow up date: 07/30/21 at 3:00 PM  Manage Stress and Anxiety issues faced  Patient Self Care Activities:  Self administers medications as prescribed Attends all scheduled provider appointments Calls provider office for new concerns or questions  Patient Coping Strengths:  Supportive Relationships Family Friends  Patient Self Care Deficits:  Anxiety or stress issues related to managing ongoing chronic care needs of her spouse  Patient Goals:  - spend time or talk with others every day - practice relaxation or meditation daily - keep a calendar with appointment dates  Follow Up Plan: LCSW to call client on 07/30/21 at 3:00 PM to assess client needs.          Norva Riffle.Angeliki Mates MSW, LCSW Licensed Clinical Social Worker [facility] Care Management [phone]

## 2021-06-06 NOTE — Chronic Care Management (AMB) (Signed)
Chronic Care Management    Clinical Social Work Note  06/06/2021 Name: Susan Contreras MRN: [id number] DOB: March 22, 1941  Susan Contreras is a 80 y.o. year old female who is a primary care patient of [name], FNP. The CCM team was consulted to assist the patient with chronic disease management and/or care coordination needs related to: [name] .   Engaged with patient by telephone for follow up visit in response to provider referral for social work chronic care management and care coordination services.   Consent to Services:  The patient was given information about Chronic Care Management services, agreed to services, and gave verbal consent prior to initiation of services.  Please see initial visit note for detailed documentation.   Patient agreed to services and consent obtained.   Assessment: Review of patient past medical history, allergies, medications, and health status, including review of relevant consultants reports was performed today as part of a comprehensive evaluation and provision of chronic care management and care coordination services.     SDOH (Social Determinants of Health) assessments and interventions performed:  SDOH Interventions    Flowsheet Row Most Recent Value  SDOH Interventions   Stress Interventions Provide Counseling  [client has stress related to managing medical needs]  Depression Interventions/Treatment  Currently on Treatment        Advanced Directives Status: See [employer]yn[employer] application for related entries.  CCM Care Plan  Allergies  Allergen Reactions   Biaxin [Clarithromycin]    Cephalexin    Ciprofloxacin    Doxycycline     Headaches   Flagyl [Metronidazole Hcl]    Ketek [Telithromycin]    Sulfa Antibiotics     Outpatient Encounter Medications as of 06/06/2021  Medication Sig   ALPRAZolam (XANAX) 0.25 MG tablet TAKE 1 TABLET TWICE DAILY AS NEEDED FOR ANXIETY   atorvastatin (LIPITOR) 20 MG tablet Take 1 tablet (20 mg  total) by mouth daily.   Chlorphen-PE-Acetaminophen (NOREL AD PO) Take 1 tablet by mouth as needed.    Cholecalciferol (VITAMIN D3) 125 MCG (5000 UT) CAPS Take 1 capsule (5,000 Units total) by mouth daily.   fluticasone (FLONASE) 50 MCG/ACT nasal spray Place 1 spray into both nostrils daily.   irbesartan (AVAPRO) 75 MG tablet Take 1 tablet (75 mg total) by mouth daily. (NEEDS TO BE SEEN BEFORE NEXT REFILL)   levothyroxine (SYNTHROID) 50 MCG tablet Take 1 tablet (50 mcg total) by mouth daily.   meclizine (ANTIVERT) 25 MG tablet TAKE ONE TABLET BY MOUTH THREE TIMES DAILY AS NEEDED FOR DIZZINESS   pantoprazole (PROTONIX) 20 MG tablet Take 1 tablet (20 mg total) by mouth 2 (two) times daily before a meal.   traZODone (DESYREL) 50 MG tablet Take 1 tablet (50 mg total) by mouth at bedtime.   No facility-administered encounter medications on file as of 06/06/2021.    Patient Active Problem List   Diagnosis Date Noted   Insomnia 05/07/2021   History of colonic polyps 04/26/2020   Benzodiazepine dependence ([place]) 05/19/2018   Controlled substance agreement signed 05/19/2018   Obesity (BMI 30.0-34.9) 59/16/3846   Metabolic syndrome 65/99/3570   GAD (generalized anxiety disorder) 08/14/2015   Allergic rhinitis 08/14/2015   GERD (gastroesophageal reflux disease) 08/14/2015   Hyperlipidemia 08/14/2015   Vitamin D deficiency 08/14/2015   Hypothyroidism 05/29/2014   Cholelithiasis 01/15/2011   Family history of colon cancer 01/15/2011   Hypertension 11/11/2007   ESOPHAGEAL STRICTURE 11/11/2007   Diaphragmatic hernia 11/11/2007   DIVERTICULOSIS OF COLON 11/11/2007  Conditions to be addressed/monitored: monitor client management of anxiety and stress issues  Care Plan : General Social Work (Adult)  Updates made by Katha Cabal, LCSW since 06/06/2021 12:00 AM     Problem: Emotional Distress      Goal: Client needs help in managing anxiety and stress issues faced   Start Date:  06/06/2021  Expected End Date: 08/30/2021  This Visit's Progress: On track  Recent Progress: On track  Priority: Medium  Note:   Current Barriers:  Chronic Mental Health needs related to anxiety and stress issues faced Limited social support and Mental Health Concerns  Suicidal Ideation/Homicidal Ideation: No Low energy, fatigued occasionally  Clinical Social Work Goal(s):  patient will work with SW monthly by telephone or in person to reduce or manage symptoms related to anxiety or stress issues faced patient will work with SW monthly to address concerns related to ongoing in home care needs of her spouse Patient will call RNCM as needed in next 30 days to discuss nursing needs of client  Interventions: 1:1 collaboration with [name], FNP regarding development and update of comprehensive plan of care as evidenced by provider attestation and co-signature  Reviewed with client the ongoing care needs of her spouse Reviewed with client the transport needs of client Discussed with client the sleeping issues of client Discussed with client the mood status of client.  She said she does get a little sad occasionally. She said she has been in more of a stressed mood recently. She talked of allergy issues, sinus drainage and talked of a dry cough. She also spoke of occasional episodes of dizziness. Discussed with client medication procurement of client Encouraged client to call RNCM for nursing needs of client Reviewed with client the pain issues of client.  She said she has occasional pain in her shoulder. Provided counseling support for client Reviewed with [place] the family support from her sons  Patient Self Care Activities:  Self administers medications as prescribed Attends all scheduled provider appointments Calls provider office for new concerns or questions  Patient Coping Strengths:  Supportive Relationships Family Friends  Patient Self Care Deficits:  Anxiety or stress  issues related to managing ongoing chronic care needs of her spouse  Patient Goals:  - spend time or talk with others every day - practice relaxation or meditation daily - keep a calendar with appointment dates  Follow Up Plan: LCSW to call client on 07/30/21 at 3:00 PM to assess client needs      Norva Riffle.Dai Mcadams MSW, LCSW Licensed Clinical Social Worker [facility] [phone]

## 2021-06-21 ENCOUNTER — Encounter: Payer: Self-pay | Admitting: Family Medicine

## 2021-06-21 ENCOUNTER — Ambulatory Visit (INDEPENDENT_AMBULATORY_CARE_PROVIDER_SITE_OTHER): Payer: Medicare HMO | Admitting: Family Medicine

## 2021-06-21 DIAGNOSIS — J0100 Acute maxillary sinusitis, unspecified: Secondary | ICD-10-CM

## 2021-06-21 MED ORDER — AMOXICILLIN 500 MG PO CAPS: 500.0000 mg | ORAL_CAPSULE | Freq: Two times a day (BID) | ORAL | 0 refills | Status: DC

## 2021-06-21 NOTE — Progress Notes (Signed)
Virtual Visit via telephone Note  I connected with Susan Contreras on 06/21/21 at 1511 by telephone and verified that I am speaking with the correct person using two identifiers. Susan Contreras is currently located at home and patient are currently with her during visit. The provider, Fransisca Kaufmann Harshini Trent, MD is located in their office at time of visit.  Call ended at 1518  I discussed the limitations, risks, security and privacy concerns of performing an evaluation and management service by telephone and the availability of in person appointments. I also discussed with the patient that there may be a patient responsible charge related to this service. The patient expressed understanding and agreed to proceed.   History and Present Illness: Patient is calling in for sinus pressure and congestion and pressure in her ear and she is tired and fatigued. She gets a lot of allergies this time of year.  She has dry cough and chest congestion. She has this for 2 weeks. It is worse outside.  She takes norel everyday and it helps some. She denies any shortness of breath or sick contacts. She did not get flu shot. She was vaccinated for covid.   1. Acute maxillary sinusitis, recurrence not specified     Outpatient Encounter Medications as of 06/21/2021  Medication Sig   amoxicillin (AMOXIL) 500 MG capsule Take 1 capsule (500 mg total) by mouth 2 (two) times daily.   ALPRAZolam (XANAX) 0.25 MG tablet TAKE 1 TABLET TWICE DAILY AS NEEDED FOR ANXIETY   atorvastatin (LIPITOR) 20 MG tablet Take 1 tablet (20 mg total) by mouth daily.   Chlorphen-PE-Acetaminophen (NOREL AD PO) Take 1 tablet by mouth as needed.    Cholecalciferol (VITAMIN D3) 125 MCG (5000 UT) CAPS Take 1 capsule (5,000 Units total) by mouth daily.   fluticasone (FLONASE) 50 MCG/ACT nasal spray Place 1 spray into both nostrils daily.   irbesartan (AVAPRO) 75 MG tablet Take 1 tablet (75 mg total) by mouth daily. (NEEDS TO BE SEEN BEFORE NEXT REFILL)    levothyroxine (SYNTHROID) 50 MCG tablet Take 1 tablet (50 mcg total) by mouth daily.   meclizine (ANTIVERT) 25 MG tablet TAKE ONE TABLET BY MOUTH THREE TIMES DAILY AS NEEDED FOR DIZZINESS   pantoprazole (PROTONIX) 20 MG tablet Take 1 tablet (20 mg total) by mouth 2 (two) times daily before a meal.   traZODone (DESYREL) 50 MG tablet Take 1 tablet (50 mg total) by mouth at bedtime.   No facility-administered encounter medications on file as of 06/21/2021.    Review of Systems  Constitutional:  Negative for chills and fever.  HENT:  Positive for congestion, postnasal drip, rhinorrhea, sinus pressure, sneezing and sore throat. Negative for ear discharge and ear pain.   Eyes:  Negative for pain, redness and visual disturbance.  Respiratory:  Positive for cough. Negative for chest tightness and shortness of breath.   Cardiovascular:  Negative for chest pain and leg swelling.  Musculoskeletal:  Negative for back pain and gait problem.  Skin:  Negative for rash.  Neurological:  Negative for light-headedness and headaches.  Psychiatric/Behavioral:  Negative for agitation and behavioral problems.   All other systems reviewed and are negative.  Observations/Objective: Patient sounds comfortable and in no acute distress  Assessment and Plan: Problem List Items Addressed This Visit   None Visit Diagnoses     Acute maxillary sinusitis, recurrence not specified    -  Primary   Relevant Medications   amoxicillin (AMOXIL) 500 MG capsule  She has been sick for 2 weeks, has sinus pressure, will treat with amoxicillin for sinusitis. Follow up plan: Return if symptoms worsen or fail to improve.     I discussed the assessment and treatment plan with the patient. The patient was provided an opportunity to ask questions and all were answered. The patient agreed with the plan and demonstrated an understanding of the instructions.   The patient was advised to call back or seek an in-person  evaluation if the symptoms worsen or if the condition fails to improve as anticipated.  The above assessment and management plan was discussed with the patient. The patient verbalized understanding of and has agreed to the management plan. Patient is aware to call the clinic if symptoms persist or worsen. Patient is aware when to return to the clinic for a follow-up visit. Patient educated on when it is appropriate to go to the emergency department.    I provided 7 minutes of non-face-to-face time during this encounter.    Worthy Rancher, MD

## 2021-06-24 DIAGNOSIS — E785 Hyperlipidemia, unspecified: Secondary | ICD-10-CM

## 2021-06-24 DIAGNOSIS — E039 Hypothyroidism, unspecified: Secondary | ICD-10-CM

## 2021-06-24 DIAGNOSIS — I159 Secondary hypertension, unspecified: Secondary | ICD-10-CM | POA: Diagnosis not present

## 2021-06-24 DIAGNOSIS — F320 Major depressive disorder, single episode, mild: Secondary | ICD-10-CM

## 2021-06-25 ENCOUNTER — Other Ambulatory Visit: Payer: Self-pay | Admitting: Family

## 2021-06-25 DIAGNOSIS — I10 Essential (primary) hypertension: Secondary | ICD-10-CM

## 2021-07-01 ENCOUNTER — Telehealth: Payer: Self-pay | Admitting: Family

## 2021-07-10 ENCOUNTER — Other Ambulatory Visit: Payer: Self-pay

## 2021-07-10 ENCOUNTER — Ambulatory Visit (INDEPENDENT_AMBULATORY_CARE_PROVIDER_SITE_OTHER): Payer: Medicare HMO | Admitting: Family

## 2021-07-10 ENCOUNTER — Encounter: Payer: Self-pay | Admitting: Family

## 2021-07-10 VITALS — BP 143/78 | HR 78 | Temp 96.4°F | Ht 61.0 in | Wt 175.2 lb

## 2021-07-10 DIAGNOSIS — G4700 Insomnia, unspecified: Secondary | ICD-10-CM

## 2021-07-10 DIAGNOSIS — Z79899 Other long term (current) drug therapy: Secondary | ICD-10-CM

## 2021-07-10 DIAGNOSIS — E559 Vitamin D deficiency, unspecified: Secondary | ICD-10-CM

## 2021-07-10 DIAGNOSIS — K219 Gastro-esophageal reflux disease without esophagitis: Secondary | ICD-10-CM | POA: Diagnosis not present

## 2021-07-10 DIAGNOSIS — E785 Hyperlipidemia, unspecified: Secondary | ICD-10-CM | POA: Diagnosis not present

## 2021-07-10 DIAGNOSIS — F411 Generalized anxiety disorder: Secondary | ICD-10-CM

## 2021-07-10 DIAGNOSIS — E039 Hypothyroidism, unspecified: Secondary | ICD-10-CM | POA: Diagnosis not present

## 2021-07-10 DIAGNOSIS — I159 Secondary hypertension, unspecified: Secondary | ICD-10-CM | POA: Diagnosis not present

## 2021-07-10 DIAGNOSIS — E669 Obesity, unspecified: Secondary | ICD-10-CM | POA: Diagnosis not present

## 2021-07-10 DIAGNOSIS — F1320 Sedative, hypnotic or anxiolytic dependence, uncomplicated: Secondary | ICD-10-CM | POA: Diagnosis not present

## 2021-07-10 MED ORDER — ALPRAZOLAM 0.25 MG PO TABS: ORAL_TABLET | ORAL | 2 refills | Status: DC

## 2021-07-10 MED ORDER — FAMOTIDINE 40 MG PO TABS: 40.0000 mg | ORAL_TABLET | Freq: Every day | ORAL | 1 refills | Status: DC

## 2021-07-10 MED ORDER — PANTOPRAZOLE SODIUM 20 MG PO TBEC: 20.0000 mg | DELAYED_RELEASE_TABLET | Freq: Two times a day (BID) | ORAL | 2 refills | Status: DC

## 2021-07-10 NOTE — Progress Notes (Signed)
Subjective:    Patient ID: Susan Contreras, female    DOB: 1941/08/02, 80 y.o.   MRN: [id number]  Chief Complaint  Patient presents with   Medical Management of Chronic Issues   Heartburn   Pt calls the office today for chronic follow up. She is followed by GI every 6 months.   Heartburn She complains of belching and heartburn. This is a chronic problem. The current episode started more than 1 year ago. The problem occurs frequently. Associated symptoms include fatigue. Risk factors include obesity. She has tried a PPI for the symptoms. The treatment provided mild relief.  Hypertension This is a chronic problem. The current episode started more than 1 year ago. The problem has been waxing and waning since onset. The problem is controlled. Associated symptoms include anxiety and malaise/fatigue. Pertinent negatives include no peripheral edema or shortness of breath. Risk factors for coronary artery disease include dyslipidemia and obesity. The current treatment provides moderate improvement. Identifiable causes of hypertension include a thyroid problem.  Thyroid Problem Presents for follow-up visit. Symptoms include anxiety, constipation and fatigue. Patient reports no diarrhea. The symptoms have been stable. Her past medical history is significant for hyperlipidemia.  Insomnia Primary symptoms: no difficulty falling asleep, no frequent awakening, malaise/fatigue.   The current episode started more than one year. The onset quality is gradual. The problem occurs intermittently.  Hyperlipidemia This is a chronic problem. The current episode started more than 1 year ago. The problem is controlled. Pertinent negatives include no shortness of breath. Current antihyperlipidemic treatment includes statins. The current treatment provides moderate improvement of lipids. Risk factors for coronary artery disease include dyslipidemia, hypertension, a sedentary lifestyle and post-menopausal.  Anxiety Presents  for follow-up visit. Symptoms include excessive worry, insomnia, irritability and nervous/anxious behavior. Patient reports no shortness of breath. Symptoms occur most days. The severity of symptoms is moderate. The quality of sleep is good.       Review of Systems  Constitutional:  Positive for fatigue, irritability and malaise/fatigue.  Respiratory:  Negative for shortness of breath.   Gastrointestinal:  Positive for constipation and heartburn. Negative for diarrhea.  Psychiatric/Behavioral:  The patient is nervous/anxious and has insomnia.   All other systems reviewed and are negative.     Objective:   Physical Exam Vitals reviewed.  Constitutional:      General: She is not in acute distress.    Appearance: She is well-developed. She is obese.  HENT:     Head: Normocephalic and atraumatic.     Right Ear: Tympanic membrane normal.     Left Ear: Tympanic membrane normal.  Eyes:     Pupils: Pupils are equal, round, and reactive to light.  Neck:     Thyroid: No thyromegaly.  Cardiovascular:     Rate and Rhythm: Normal rate and regular rhythm.     Heart sounds: Normal heart sounds. No murmur heard. Pulmonary:     Effort: Pulmonary effort is normal. No respiratory distress.     Breath sounds: Normal breath sounds. No wheezing.  Abdominal:     General: Bowel sounds are normal. There is no distension.     Palpations: Abdomen is soft.     Tenderness: There is no abdominal tenderness.  Musculoskeletal:        General: No tenderness. Normal range of motion.     Cervical back: Normal range of motion and neck supple.  Skin:    General: Skin is warm and dry.  Neurological:  Mental Status: She is alert and oriented to person, place, and time.     Cranial Nerves: No cranial nerve deficit.     Deep Tendon Reflexes: Reflexes are normal and symmetric.  Psychiatric:        Behavior: Behavior normal.        Thought Content: Thought content normal.        Judgment: Judgment normal.          BP (!) 143/78   Pulse 78   Temp (!) 96.4 F (35.8 C) (Temporal)   Ht 5' 1"  (1.549 m)   Wt 175 lb 3.2 oz (79.5 kg)   BMI 33.10 kg/m   Assessment & Plan:  Susan Contreras comes in today with chief complaint of Medical Management of Chronic Issues and Heartburn   Diagnosis and orders addressed:  1. Controlled substance agreement signed - ALPRAZolam (XANAX) 0.25 MG tablet; TAKE 1 TABLET TWICE DAILY AS NEEDED FOR ANXIETY  Dispense: 60 tablet; Refill: 2 - CBC with Differential/Platelet - CMP14+EGFR  2. Benzodiazepine dependence ([facility]) - ALPRAZolam (XANAX) 0.25 MG tablet; TAKE 1 TABLET TWICE DAILY AS NEEDED FOR ANXIETY  Dispense: 60 tablet; Refill: 2 - CBC with Differential/Platelet - CMP14+EGFR  3. GAD (generalized anxiety disorder) - ALPRAZolam (XANAX) 0.25 MG tablet; TAKE 1 TABLET TWICE DAILY AS NEEDED FOR ANXIETY  Dispense: 60 tablet; Refill: 2 - CBC with Differential/Platelet - CMP14+EGFR  4. Secondary hypertension - CBC with Differential/Platelet - CMP14+EGFR  5. Gastroesophageal reflux disease, unspecified whether esophagitis present Will add Pepcid 40 mg daily -Diet discussed- Avoid fried, spicy, citrus foods, caffeine and alcohol -Do not eat 2-3 hours before bedtime -Encouraged small frequent meals -Avoid NSAID's -RTO prn   - pantoprazole (PROTONIX) 20 MG tablet; Take 1 tablet (20 mg total) by mouth 2 (two) times daily before a meal.  Dispense: 180 tablet; Refill: 2 - CBC with Differential/Platelet - CMP14+EGFR  6. Hypothyroidism, unspecified type - CBC with Differential/Platelet - CMP14+EGFR - TSH  7. Obesity (BMI 30.0-34.9) - CBC with Differential/Platelet - CMP14+EGFR  8. Insomnia, unspecified type - CBC with Differential/Platelet - CMP14+EGFR  9. Hyperlipidemia, unspecified hyperlipidemia type  - CBC with Differential/Platelet - CMP14+EGFR  10. Vitamin D deficiency - CBC with Differential/Platelet - CMP14+EGFR   Labs  pending Patient reviewed in [place] controlled database, no flags noted. Contract and drug screen are up to date.  Health Maintenance reviewed Diet and exercise encouraged  Follow up plan: 3 months    Susan Dun, FNP

## 2021-07-10 NOTE — Patient Instructions (Addendum)
Food Choices for Gastroesophageal Reflux Disease, Adult When you have gastroesophageal reflux disease (GERD), the foods you eat and your eating habits are very important. Choosing the right foods can help ease your discomfort. Think about working with a food expert (dietitian) to help you make good choices. What are tips for following this plan? Reading food labels Look for foods that are low in saturated fat. Foods that may help with your symptoms include: Foods that have less than 5% of daily value (DV) of fat. Foods that have 0 grams of trans fat. Cooking Do not fry your food. Cook your food by baking, steaming, grilling, or broiling. These are all methods that do not need a lot of fat for cooking. To add flavor, try to use herbs that are low in spice and acidity. Meal planning  Choose healthy foods that are low in fat, such as: Fruits and vegetables. Whole grains. Low-fat dairy products. Lean meats, fish, and poultry. Eat small meals often instead of eating 3 large meals each day. Eat your meals slowly in a place where you are relaxed. Avoid bending over or lying down until 2-3 hours after eating. Limit high-fat foods such as fatty meats or fried foods. Limit your intake of fatty foods, such as oils, butter, and shortening. Avoid the following as told by your doctor: Foods that cause symptoms. These may be different for different people. Keep a food diary to keep track of foods that cause symptoms. Alcohol. Drinking a lot of liquid with meals. Eating meals during the 2-3 hours before bed. Lifestyle Stay at a healthy weight. Ask your doctor what weight is healthy for you. If you need to lose weight, work with your doctor to do so safely. Exercise for at least 30 minutes on 5 or more days each week, or as told by your doctor. Wear loose-fitting clothes. Do not smoke or use any products that contain nicotine or tobacco. If you need help quitting, ask your doctor. Sleep with the head  of your bed higher than your feet. Use a wedge under the mattress or blocks under the bed frame to raise the head of the bed. Chew sugar-free gum after meals. What foods should eat? Eat a healthy, well-balanced diet of fruits, vegetables, whole grains, low-fat dairy products, lean meats, fish, and poultry. Each person is different. Foods that may cause symptoms in one person may not cause any symptoms in another person. Work with your doctor to find foods that are safe for you. The items listed above may not be a complete list of what you can eat and drink. Contact a food expert for more options. What foods should I avoid? Limiting some of these foods may help in managing the symptoms of GERD. Everyone is different. Talk with a food expert or your doctor to help you find the exact foods to avoid, if any. Fruits Any fruits prepared with added fat. Any fruits that cause symptoms. For some people, this may include citrus fruits, such as oranges, grapefruit, pineapple, and lemons. Vegetables Deep-fried vegetables. French fries. Any vegetables prepared with added fat. Any vegetables that cause symptoms. For some people, this may include tomatoes and tomato products, chili peppers, onions and garlic, and horseradish. Grains Pastries or quick breads with added fat. Meats and other proteins High-fat meats, such as fatty beef or pork, hot dogs, ribs, ham, sausage, salami, and bacon. Fried meat or protein, including fried fish and fried chicken. Nuts and nut butters, in large amounts. Dairy Whole milk   and chocolate milk. Sour cream. Cream. Ice cream. Cream cheese. Milkshakes. Fats and oils Butter. Margarine. Shortening. Ghee. Beverages Coffee and tea, with or without caffeine. Carbonated beverages. Sodas. Energy drinks. Fruit juice made with acidic fruits, such as orange or grapefruit. Tomato juice. Alcoholic drinks. Sweets and desserts Chocolate and cocoa. Donuts. Seasonings and condiments Pepper.  Peppermint and spearmint. Added salt. Any condiments, herbs, or seasonings that cause symptoms. For some people, this may include curry, hot sauce, or vinegar-based salad dressings. The items listed above may not be a complete list of what you should not eat and drink. Contact a food expert for more options. Questions to ask your doctor Diet and lifestyle changes are often the first steps that are taken to manage symptoms of GERD. If diet and lifestyle changes do not help, talk with your doctor about taking medicines. Where to find more information [employer] Gastrointestinal Disorders: aboutgerd.org Summary When you have GERD, food and lifestyle choices are very important in easing your symptoms. Eat small meals often instead of 3 large meals a day. Eat your meals slowly and in a place where you are relaxed. Avoid bending over or lying down until 2-3 hours after eating. Limit high-fat foods such as fatty meats or fried foods. This information is not intended to replace advice given to you by your health care provider. Make sure you discuss any questions you have with your health care provider  Gastroesophageal Reflux Disease, Adult Gastroesophageal reflux (GER) happens when acid from the stomach flows up into the tube that connects the mouth and the stomach (esophagus). Normally, food travels down the esophagus and stays in the stomach to be digested. However, when a person has GER, food and stomach acid sometimes move back up into the esophagus. If this becomes a more serious problem, the person may be diagnosed with a disease called gastroesophageal reflux disease (GERD). GERD occurs when the reflux: Happens often. Causes frequent or severe symptoms. Causes problems such as damage to the esophagus. When stomach acid comes in contact with the esophagus, the acid may cause inflammation in the esophagus. Over time, GERD may create small holes (ulcers) in the lining of the  esophagus. What are the causes? This condition is caused by a problem with the muscle between the esophagus and the stomach (lower esophageal sphincter, or LES). Normally, the LES muscle closes after food passes through the esophagus to the stomach. When the LES is weakened or abnormal, it does not close properly, and that allows food and stomach acid to go back up into the esophagus. The LES can be weakened by certain dietary substances, medicines, and medical conditions, including: Tobacco use. Pregnancy. Having a hiatal hernia. Alcohol use. Certain foods and beverages, such as coffee, chocolate, onions, and peppermint. What increases the risk? You are more likely to develop this condition if you: Have an increased body weight. Have a connective tissue disorder. Take NSAIDs, such as ibuprofen. What are the signs or symptoms? Symptoms of this condition include: Heartburn. Difficult or painful swallowing and the feeling of having a lump in the throat. A bitter taste in the mouth. Bad breath and having a large amount of saliva. Having an upset or bloated stomach and belching. Chest pain. Different conditions can cause chest pain. Make sure you see your health care provider if you experience chest pain. Shortness of breath or wheezing. Ongoing (chronic) cough or a nighttime cough. Wearing away of tooth enamel. Weight loss. How is this diagnosed? This condition may  be diagnosed based on a medical history and a physical exam. To determine if you have mild or severe GERD, your health care provider may also monitor how you respond to treatment. You may also have tests, including: A test to examine your stomach and esophagus with a small camera (endoscopy). A test that measures the acidity level in your esophagus. A test that measures how much pressure is on your esophagus. A barium swallow or modified barium swallow test to show the shape, size, and functioning of your esophagus. How is  this treated? Treatment for this condition may vary depending on how severe your symptoms are. Your health care provider may recommend: Changes to your diet. Medicine. Surgery. The goal of treatment is to help relieve your symptoms and to prevent complications. Follow these instructions at home: Eating and drinking  Follow a diet as recommended by your health care provider. This may involve avoiding foods and drinks such as: Coffee and tea, with or without caffeine. Drinks that contain alcohol. Energy drinks and sports drinks. Carbonated drinks or sodas. Chocolate and cocoa. Peppermint and mint flavorings. Garlic and onions. Horseradish. Spicy and acidic foods, including peppers, chili powder, curry powder, vinegar, hot sauces, and barbecue sauce. Citrus fruit juices and citrus fruits, such as oranges, lemons, and limes. Tomato-based foods, such as red sauce, chili, salsa, and pizza with red sauce. Fried and fatty foods, such as donuts, french fries, potato chips, and high-fat dressings. High-fat meats, such as hot dogs and fatty cuts of red and white meats, such as rib eye steak, sausage, ham, and bacon. High-fat dairy items, such as whole milk, butter, and cream cheese. Eat small, frequent meals instead of large meals. Avoid drinking large amounts of liquid with your meals. Avoid eating meals during the 2-3 hours before bedtime. Avoid lying down right after you eat. Do not exercise right after you eat. Lifestyle  Do not use any products that contain nicotine or tobacco. These products include cigarettes, chewing tobacco, and vaping devices, such as e-cigarettes. If you need help quitting, ask your health care provider. Try to reduce your stress by using methods such as yoga or meditation. If you need help reducing stress, ask your health care provider. If you are overweight, reduce your weight to an amount that is healthy for you. Ask your health care provider for guidance about a  safe weight loss goal. General instructions Pay attention to any changes in your symptoms. Take over-the-counter and prescription medicines only as told by your health care provider. Do not take aspirin, ibuprofen, or other NSAIDs unless your health care provider told you to take these medicines. Wear loose-fitting clothing. Do not wear anything tight around your waist that causes pressure on your abdomen. Raise (elevate) the head of your bed about 6 inches (15 cm). You can use a wedge to do this. Avoid bending over if this makes your symptoms worse. Keep all follow-up visits. This is important. Contact a health care provider if: You have: New symptoms. Unexplained weight loss. Difficulty swallowing or it hurts to swallow. Wheezing or a persistent cough. A hoarse voice. Your symptoms do not improve with treatment. Get help right away if: You have sudden pain in your arms, neck, jaw, teeth, or back. You suddenly feel sweaty, dizzy, or light-headed. You have chest pain or shortness of breath. You vomit and the vomit is green, yellow, or black, or it looks like blood or coffee grounds. You faint. You have stool that is red, bloody, or black.  You cannot swallow, drink, or eat. These symptoms may represent a serious problem that is an emergency. Do not wait to see if the symptoms will go away. Get medical help right away. Call your local emergency services (911 in the [place] Do not drive yourself to the hospital. Summary Gastroesophageal reflux happens when acid from the stomach flows up into the esophagus. GERD is a disease in which the reflux happens often, causes frequent or severe symptoms, or causes problems such as damage to the esophagus. Treatment for this condition may vary depending on how severe your symptoms are. Your health care provider may recommend diet and lifestyle changes, medicine, or surgery. Contact a health care provider if you have new or worsening symptoms. Take  over-the-counter and prescription medicines only as told by your health care provider. Do not take aspirin, ibuprofen, or other NSAIDs unless your health care provider told you to do so. Keep all follow-up visits as told by your health care provider. This is important. This information is not intended to replace advice given to you by your health care provider. Make sure you discuss any questions you have with your health care provider. Document Revised: 02/20/2020 Document Reviewed: 02/20/2020 Elsevier Patient Education  [place] Revised: 02/20/2020 Document Reviewed: 02/20/2020 Elsevier Patient Education  2022 [employer].

## 2021-07-11 LAB — CMP14+EGFR
ALT: 7 IU/L (ref 0–32)
AST: 14 IU/L (ref 0–40)
Albumin/Globulin Ratio: 1.7 (ref 1.2–2.2)
Albumin: 4.6 g/dL (ref 3.7–4.7)
Alkaline Phosphatase: 80 IU/L (ref 44–121)
BUN/Creatinine Ratio: 7 — ABNORMAL LOW (ref 12–28)
BUN: 10 mg/dL (ref 8–27)
Bilirubin Total: 0.6 mg/dL (ref 0.0–1.2)
CO2: 20 mmol/L (ref 20–29)
Calcium: 9.4 mg/dL (ref 8.7–10.3)
Chloride: 99 mmol/L (ref 96–106)
Creatinine, Ser: 1.37 mg/dL — ABNORMAL HIGH (ref 0.57–1.00)
Globulin, Total: 2.7 g/dL (ref 1.5–4.5)
Glucose: 88 mg/dL (ref 70–99)
Potassium: 4.4 mmol/L (ref 3.5–5.2)
Sodium: 139 mmol/L (ref 134–144)
Total Protein: 7.3 g/dL (ref 6.0–8.5)
eGFR: 40 mL/min/{1.73_m2} — ABNORMAL LOW (ref >59)

## 2021-07-11 LAB — CBC WITH DIFFERENTIAL/PLATELET
Basophils Absolute: 0.1 10*3/uL (ref 0.0–0.2)
Basos: 1 %
EOS (ABSOLUTE): 0.3 10*3/uL (ref 0.0–0.4)
Eos: 4 %
Hematocrit: 37.4 % (ref 34.0–46.6)
Hemoglobin: 12.4 g/dL (ref 11.1–15.9)
Immature Grans (Abs): 0 10*3/uL (ref 0.0–0.1)
Immature Granulocytes: 0 %
Lymphocytes Absolute: 3.4 10*3/uL — ABNORMAL HIGH (ref 0.7–3.1)
Lymphs: 41 %
MCH: 30.2 pg (ref 26.6–33.0)
MCHC: 33.2 g/dL (ref 31.5–35.7)
MCV: 91 fL (ref 79–97)
Monocytes Absolute: 0.6 10*3/uL (ref 0.1–0.9)
Monocytes: 8 %
Neutrophils Absolute: 3.7 10*3/uL (ref 1.4–7.0)
Neutrophils: 46 %
Platelets: 301 10*3/uL (ref 150–450)
RBC: 4.1 x10E6/uL (ref 3.77–5.28)
RDW: 12.9 % (ref 11.7–15.4)
WBC: 8.2 10*3/uL (ref 3.4–10.8)

## 2021-07-11 LAB — TSH: TSH: 3.08 u[IU]/mL (ref 0.450–4.500)

## 2021-07-30 ENCOUNTER — Ambulatory Visit: Payer: Medicare HMO | Admitting: Licensed Clinical Social Worker

## 2021-07-30 DIAGNOSIS — K219 Gastro-esophageal reflux disease without esophagitis: Secondary | ICD-10-CM

## 2021-07-30 DIAGNOSIS — E039 Hypothyroidism, unspecified: Secondary | ICD-10-CM

## 2021-07-30 DIAGNOSIS — E785 Hyperlipidemia, unspecified: Secondary | ICD-10-CM

## 2021-07-30 DIAGNOSIS — F411 Generalized anxiety disorder: Secondary | ICD-10-CM

## 2021-07-30 DIAGNOSIS — I159 Secondary hypertension, unspecified: Secondary | ICD-10-CM

## 2021-07-30 DIAGNOSIS — F320 Major depressive disorder, single episode, mild: Secondary | ICD-10-CM

## 2021-07-30 NOTE — Chronic Care Management (AMB) (Signed)
Chronic Care Management    Clinical Social Work Note  07/30/2021 Name: Susan Contreras MRN: [id number] DOB: 1941/04/06  Susan Contreras is a 80 y.o. year old female who is a primary care patient of [name], FNP. The CCM team was consulted to assist the patient with chronic disease management and/or care coordination needs related to: [name] .   Engaged with patient by telephone for follow up visit in response to provider referral for social work chronic care management and care coordination services.   Consent to Services:  The patient was given information about Chronic Care Management services, agreed to services, and gave verbal consent prior to initiation of services.  Please see initial visit note for detailed documentation.   Patient agreed to services and consent obtained.   Assessment: Review of patient past medical history, allergies, medications, and health status, including review of relevant consultants reports was performed today as part of a comprehensive evaluation and provision of chronic care management and care coordination services.     SDOH (Social Determinants of Health) assessments and interventions performed:  SDOH Interventions    Flowsheet Row Most Recent Value  SDOH Interventions   Stress Interventions Provide Counseling  Burnett Sheng is stressed and anxious.. She is concerned over her health needs. She is concerned over health needs of her spouse. She seems irritable .  she said she has not had flu shot and has not had a COVID booster shot.]  Depression Interventions/Treatment  Currently on Treatment        Advanced Directives Status: See [employer] application for related entries.  CCM Care Plan  Allergies  Allergen Reactions   Biaxin [Clarithromycin]    Cephalexin    Ciprofloxacin    Doxycycline     Headaches   Flagyl [Metronidazole Hcl]    Ketek [Telithromycin]    Sulfa Antibiotics     Outpatient Encounter Medications as of 07/30/2021   Medication Sig   ALPRAZolam (XANAX) 0.25 MG tablet TAKE 1 TABLET TWICE DAILY AS NEEDED FOR ANXIETY   atorvastatin (LIPITOR) 20 MG tablet Take 1 tablet (20 mg total) by mouth daily.   Chlorphen-PE-Acetaminophen (NOREL AD PO) Take 1 tablet by mouth as needed.    Cholecalciferol (VITAMIN D3) 125 MCG (5000 UT) CAPS Take 1 capsule (5,000 Units total) by mouth daily.   famotidine (PEPCID) 40 MG tablet Take 1 tablet (40 mg total) by mouth daily.   fluticasone (FLONASE) 50 MCG/ACT nasal spray Place 1 spray into both nostrils daily.   irbesartan (AVAPRO) 75 MG tablet TAKE ONE TABLET ONCE DAILY   levothyroxine (SYNTHROID) 50 MCG tablet Take 1 tablet (50 mcg total) by mouth daily.   meclizine (ANTIVERT) 25 MG tablet TAKE ONE TABLET BY MOUTH THREE TIMES DAILY AS NEEDED FOR DIZZINESS   pantoprazole (PROTONIX) 20 MG tablet Take 1 tablet (20 mg total) by mouth 2 (two) times daily before a meal.   traZODone (DESYREL) 50 MG tablet Take 1 tablet (50 mg total) by mouth at bedtime.   No facility-administered encounter medications on file as of 07/30/2021.    Patient Active Problem List   Diagnosis Date Noted   Insomnia 05/07/2021   History of colonic polyps 04/26/2020   Benzodiazepine dependence ([place]) 05/19/2018   Controlled substance agreement signed 05/19/2018   Obesity (BMI 30.0-34.9) 56/38/7564   Metabolic syndrome 33/29/5188   GAD (generalized anxiety disorder) 08/14/2015   Allergic rhinitis 08/14/2015   GERD (gastroesophageal reflux disease) 08/14/2015   Hyperlipidemia 08/14/2015   Vitamin D deficiency 08/14/2015  Hypothyroidism 05/29/2014   Cholelithiasis 01/15/2011   Family history of colon cancer 01/15/2011   Hypertension 11/11/2007   ESOPHAGEAL STRICTURE 11/11/2007   Diaphragmatic hernia 11/11/2007   DIVERTICULOSIS OF COLON 11/11/2007    Conditions to be addressed/monitored: monitor client management of anxiety and stress issues  Care Plan : General Social Work (Adult)  Updates made  by Katha Cabal, LCSW since 07/30/2021 12:00 AM     Problem: Emotional Distress      Goal: Client needs help in managing anxiety and stress issues faced   Start Date: 07/30/2021  Expected End Date: 10/24/2021  This Visit's Progress: Not on track  Recent Progress: On track  Priority: High  Note:   Current Barriers:  Chronic Mental Health needs related to anxiety and stress issues faced Limited social support and Mental Health Concerns  Suicidal Ideation/Homicidal Ideation: No Low energy, fatigued occasionally  Clinical Social Work Goal(s):  patient will work with SW monthly by telephone or in person to reduce or manage symptoms related to anxiety or stress issues faced patient will work with SW monthly to address concerns related to ongoing in home care needs of her spouse Patient will call RNCM as needed in next 30 days to discuss nursing needs of client Patient will attend scheduled medical appointments in next 30 days  Interventions: 1:1 collaboration with [name], FNP regarding development and update of comprehensive plan of care as evidenced by provider attestation and co-signature  Reviewed with client the ongoing care needs of her spouse Reviewed with client the transport needs of client Discussed with client the sleeping issues of client. Client said she has difficulty sleeping  Discussed with client the mood status of client.  She said she does get a little sad occasionally. She said she has been in more of a stressed mood recently. She spoke of fear of getting flu or a cold; she spoke of uncertainty of care treatment for her stomach issues, esophageal issues. She spoke of some family support but wishes she had more family support. Thy said she is taking xanax .  She said she may not take xanax every day; but she is taking it fairly regularly. Discussed support from [facility] for nursing needs of client.  Marriah said she would like to talk with RNCM about stomach  issues, esophageal issues faced. She spoke of using Protonix as prescribed. Reviewed transport needs of client. She said she drives her car as needed to her appointments and to complete errands. Discussed with client medication procurement of client Reviewed with client the pain issues of client.  She said she has occasional pain in her shoulder.She said she has occasional back pain Provided counseling support for client Encouraged Marcea to call LCSW as needed in next 30 days to discuss social work needs of client  Patient Self Care Activities:  Self administers medications as prescribed Attends all scheduled provider appointments Calls provider office for new concerns or questions  Patient Coping Strengths:  Supportive Relationships Family Friends  Patient Self Care Deficits:  Anxiety or stress issues related to managing ongoing chronic care needs of her spouse  Patient Goals:  - spend time or talk with others every day - practice relaxation or meditation daily - keep a calendar with appointment dates  Follow Up Plan: LCSW to call client on  09/25/21 at 11:15 AM to assess client needs       Norva Riffle.Editha Bridgeforth MSW, LCSW Licensed Clinical Social Worker [facility] Care Management [phone]

## 2021-07-30 NOTE — Patient Instructions (Addendum)
Visit Information  Patient Goals: Manage My Emotions. Manage Anxiety and Stress issues  Timeframe:  Short-Term Goal Priority:  High Progress: Not on Track Start Date:      07/30/21          Expected End Date:    10/24/21                   Follow Up Date 09/25/21 at 11:15 AM   Manage My Emotions; Manage Anxiety and Stress issues    Why is this important?   When you are stressed, down or upset, your body reacts too.  For example, your blood pressure may get higher; you may have a headache or stomachache.  When your emotions get the best of you, your body's ability to fight off cold and flu gets weak.  These steps will help you manage your emotions.    Coping Skills of Patient: Has some family support from her spouse and from her son Has no driving issues Has food supply; has medications prescribed; has no walking issues  Patient Deficits:  Anxiety and stress issues Some irritability occasionally Sometimes forgets to take prescribed medications on schedule  Patient Goals: In next 30 days, patient will: Attend scheduled medical appointments Take medications as prescribed Communicate with spouse and son about her ongoing needs  Follow Up Plan:  LCSW to call client on 09/25/21 at 11:15 AM to assess client needs at that time.  Norva Riffle.Steven Veazie MSW, LCSW Licensed Clinical Social Worker [facility] Care Management [phone]

## 2021-07-31 ENCOUNTER — Ambulatory Visit: Payer: Medicare HMO | Admitting: *Deleted

## 2021-07-31 DIAGNOSIS — F411 Generalized anxiety disorder: Secondary | ICD-10-CM

## 2021-07-31 DIAGNOSIS — I159 Secondary hypertension, unspecified: Secondary | ICD-10-CM

## 2021-07-31 DIAGNOSIS — E039 Hypothyroidism, unspecified: Secondary | ICD-10-CM

## 2021-07-31 DIAGNOSIS — K219 Gastro-esophageal reflux disease without esophagitis: Secondary | ICD-10-CM

## 2021-07-31 DIAGNOSIS — E785 Hyperlipidemia, unspecified: Secondary | ICD-10-CM

## 2021-07-31 NOTE — Patient Instructions (Signed)
Visit Information  Patient Goals/Self-Care Activities: Take medications as prescribed   Attend all scheduled provider appointments Perform all self care activities independently  Perform IADL's (shopping, preparing meals, housekeeping, managing finances) independently Call provider office for new concerns or questions  check blood pressure 3 times per week write blood pressure results in a log or diary take blood pressure log to all doctor appointments call doctor for signs and symptoms of high blood pressure - take all medications exactly as prescribed Check lipid panel at next PCP visit Keep appointment for Mammogram on 08/07/21 at 11:30 at [place] foods that spicy or acid foods that cause GERD to worsen Call PCP or GI with any new or worsening symptoms Talk with LCSW regarding stress/anxiety Call RN Care Manager as needed [phone]  Patient verbalizes understanding of instructions provided today and agrees to view in [place].   Plan:Telephone follow up appointment with care management team member scheduled for:  09/09/21 with RNCM The patient has been provided with contact information for the care management team and has been advised to call with any health related questions or concerns.   Chong Sicilian, BSN, RN-BC Embedded Chronic Care Manager [facility] [place] Management Direct Dial: [phone]

## 2021-07-31 NOTE — Chronic Care Management (AMB) (Signed)
Chronic Care Management   CCM RN Visit Note  07/31/2021 Name: Susan Contreras MRN: [id number] DOB: 15-Jun-1941  Subjective: Susan Contreras is a 80 y.o. year old female who is a primary care patient of [name], FNP. The care management team was consulted for assistance with disease management and care coordination needs.    Engaged with patient by telephone for follow up visit in response to provider referral for case management and/or care coordination services.   Consent to Services:  The patient was given information about Chronic Care Management services, agreed to services, and gave verbal consent prior to initiation of services.  Please see initial visit note for detailed documentation.   Patient agreed to services and verbal consent obtained.   Assessment: Review of patient past medical history, allergies, medications, health status, including review of consultants reports, laboratory and other test data, was performed as part of comprehensive evaluation and provision of chronic care management services.   SDOH (Social Determinants of Health) assessments and interventions performed:    CCM Care Plan  Allergies  Allergen Reactions   Biaxin [Clarithromycin]    Cephalexin    Ciprofloxacin    Doxycycline     Headaches   Flagyl [Metronidazole Hcl]    Ketek [Telithromycin]    Sulfa Antibiotics     Outpatient Encounter Medications as of 07/31/2021  Medication Sig   ALPRAZolam (XANAX) 0.25 MG tablet TAKE 1 TABLET TWICE DAILY AS NEEDED FOR ANXIETY   atorvastatin (LIPITOR) 20 MG tablet Take 1 tablet (20 mg total) by mouth daily.   Chlorphen-PE-Acetaminophen (NOREL AD PO) Take 1 tablet by mouth as needed.    Cholecalciferol (VITAMIN D3) 125 MCG (5000 UT) CAPS Take 1 capsule (5,000 Units total) by mouth daily.   famotidine (PEPCID) 40 MG tablet Take 1 tablet (40 mg total) by mouth daily.   fluticasone (FLONASE) 50 MCG/ACT nasal spray Place 1 spray into both nostrils daily.    irbesartan (AVAPRO) 75 MG tablet TAKE ONE TABLET ONCE DAILY   levothyroxine (SYNTHROID) 50 MCG tablet Take 1 tablet (50 mcg total) by mouth daily.   meclizine (ANTIVERT) 25 MG tablet TAKE ONE TABLET BY MOUTH THREE TIMES DAILY AS NEEDED FOR DIZZINESS   pantoprazole (PROTONIX) 20 MG tablet Take 1 tablet (20 mg total) by mouth 2 (two) times daily before a meal.   traZODone (DESYREL) 50 MG tablet Take 1 tablet (50 mg total) by mouth at bedtime.   No facility-administered encounter medications on file as of 07/31/2021.    Patient Active Problem List   Diagnosis Date Noted   Insomnia 05/07/2021   History of colonic polyps 04/26/2020   Benzodiazepine dependence ([place]) 05/19/2018   Controlled substance agreement signed 05/19/2018   Obesity (BMI 30.0-34.9) 40/98/1191   Metabolic syndrome 47/82/9562   GAD (generalized anxiety disorder) 08/14/2015   Allergic rhinitis 08/14/2015   GERD (gastroesophageal reflux disease) 08/14/2015   Hyperlipidemia 08/14/2015   Vitamin D deficiency 08/14/2015   Hypothyroidism 05/29/2014   Cholelithiasis 01/15/2011   Family history of colon cancer 01/15/2011   Hypertension 11/11/2007   ESOPHAGEAL STRICTURE 11/11/2007   Diaphragmatic hernia 11/11/2007   DIVERTICULOSIS OF COLON 11/11/2007    Conditions to be addressed/monitored:HTN, HLD, Anxiety, Depression, GERD, and Hypothyroidism  Care Plan : [facility] Care Plan  Updates made by Ilean China, RN since 07/31/2021 12:00 AM     Problem: Chronic Disease Management Needs   Priority: High  Onset Date: 07/31/2021     Long-Range Goal: Work with RN Care Manager  Regarding Care Coordination and Care Management Associated with HTN, GAD, GERD, HLD, Hypothyroidism,Depression   Start Date: 07/31/2021  Expected End Date: 07/31/2022  This Visit's Progress: On track  Recent Progress: On track  Priority: High  Note:   Current Barriers:  Chronic Disease Management support and education needs related to HTN, HLD, and GERD,  hypothyroidism, depression, anxiety Anxiety Caregiver strain  RNCM Clinical Goal(s):  Patient will continue to work with Consulting civil engineer and/or Social Worker to address care management and care coordination needs related to HTN, HLD, and GERD, hypothyroidism, depression, and anxiety as evidenced by adherence to CM Team Scheduled appointments     through collaboration with RN Care manager, provider, and care team.  Patient will keep appointment for screening mammogram and [facility] and follow-up as recommended  Interventions: 1:1 collaboration with primary care provider regarding development and update of comprehensive plan of care as evidenced by provider attestation and co-signature Inter-disciplinary care team collaboration (see longitudinal plan of care) Evaluation of current treatment plan related to  self management and patient's adherence to plan as established by provider Therapeutic listening utilized regarding caregiver strain and health related anxiety Consulted by LCSW regarding management of medical conditions   Health Maintenance (Status: New goal.)  Short Term Goal  Patient interviewed about adult health maintenance status including Colonoscopy, pneumonia Vaccine, Yearly Influenza vaccine, COVID vaccinations, Mammogram   Advised patient to discuss Pneumonia Vaccine with primary care provider. Recommended Pneumovax after 08/07/21 Documented Historical Immunization accuracy confirmed  Patient provided CDC guideline information about Pneumonia vaccine Assisted in scheduling screening mammogram at [facility] on 08/07/21 at 11:30 Sent patient a My Chart Message with a copy of the appointment confirmation and advised to reach out to [facility] directly if she needs to reschedule   Hypothyroidism:  (Status: Goal on Track (progressing): YES.) Long Term Goal  Reviewed and discussed medications and importance of compliance Reviewed and discussed most recent lab results and normal TSH Encouraged  patient to f/u with PCP as recommended and to have TSH annually    Hypertension: (Status: New goal.) Last practice recorded BP readings:  BP Readings from Last 3 Encounters:  07/10/21 (!) 143/78  01/23/21 117/67  08/13/20 123/72  Most recent eGFR/CrCl:  Lab Results  Component Value Date   EGFR 40 (L) 07/10/2021    No components found for: CRCL  Evaluation of current treatment plan related to hypertension self management and patient's adherence to plan as established by provider;   Reviewed medications with patient and discussed importance of compliance;  Counseled on the importance of exercise goals with target of 150 minutes per week Discussed plans with patient for ongoing care management follow up and provided patient with direct contact information for care management team; Advised patient, providing education and rationale, to monitor blood pressure daily and record, calling PCP for findings outside established parameters;  Discussed complications of poorly controlled blood pressure such as heart disease, stroke, circulatory complications, vision complications, kidney impairment, sexual dysfunction;  Assessed social determinant of health barriers;    Hyperlipidemia:  (Status: New goal.) Long Term Goal  Lab Results  Component Value Date   CHOL 225 (H) 04/13/2020   HDL 51 04/13/2020   LDLCALC 121 (H) 04/13/2020   TRIG 302 (H) 04/13/2020   CHOLHDL 4.4 04/13/2020   Medication review performed; medication list updated in electronic medical record.  Provider established cholesterol goals reviewed; Counseled on importance of regular laboratory monitoring as prescribed; Reviewed role and benefits of statin for ASCVD risk reduction;  Reviewed importance of limiting foods high in cholesterol; Reviewed exercise goals and target of 150 minutes per week; Recommended having lipid panel done at least once a year. Advised that it is overdue and was not checked at her last visit. Recommended  that she have it at her next PCP visit.     GERD:  (Status: Goal on Track (progressing): YES.) Long Term Goal  Evaluation of treatment plan related to GERD and patient's adherence Reviewed and discussed medications and importance of compliance: Prevacid daily and prilosec twice daily Reviewed and discussed most recent GI visit and encouraged to f/u with GI if she continues to have GERD and dysphagia despite treatment with PPI and H2 blocker. Discussed diet and foods that make GERD worse Verbal education provided on ways to improve GERD symptoms   Patient Goals/Self-Care Activities: Take medications as prescribed   Attend all scheduled provider appointments Perform all self care activities independently  Perform IADL's (shopping, preparing meals, housekeeping, managing finances) independently Call provider office for new concerns or questions  check blood pressure 3 times per week write blood pressure results in a log or diary take blood pressure log to all doctor appointments call doctor for signs and symptoms of high blood pressure - take all medications exactly as prescribed Check lipid panel at next PCP visit Keep appointment for Mammogram on 08/07/21 at 11:30 at [place] foods that spicy or acid foods that cause GERD to worsen Call PCP or GI with any new or worsening symptoms Talk with LCSW regarding stress/anxiety Call RN Care Manager as needed [phone]  Plan:Telephone follow up appointment with care management team member scheduled for:  09/09/21 with RNCM The patient has been provided with contact information for the care management team and has been advised to call with any health related questions or concerns.   Chong Sicilian, BSN, RN-BC Embedded Chronic Care Manager [facility] / [place] Management Direct Dial: [phone]

## 2021-08-02 ENCOUNTER — Ambulatory Visit: Payer: Medicare HMO | Admitting: *Deleted

## 2021-08-02 DIAGNOSIS — F411 Generalized anxiety disorder: Secondary | ICD-10-CM

## 2021-08-02 DIAGNOSIS — E039 Hypothyroidism, unspecified: Secondary | ICD-10-CM

## 2021-08-05 NOTE — Patient Instructions (Signed)
Visit Information   Patient Goals/Self-Care Activities: Take medications as prescribed   Attend all scheduled provider appointments Perform all self care activities independently  Perform IADL's (shopping, preparing meals, housekeeping, managing finances) independently Call provider office for new concerns or questions  check blood pressure 3 times per week write blood pressure results in a log or diary take blood pressure log to all doctor appointments call doctor for signs and symptoms of high blood pressure - take all medications exactly as prescribed Check lipid panel at next PCP visit Keep appointment for Mammogram on 08/07/21 at 11:30 at [place] foods that spicy or acid foods that cause GERD to worsen Call PCP or GI with any new or worsening symptoms Talk with LCSW regarding stress/anxiety Call RN Care Manager as needed [phone] Use a stool to put your feet on when having a bowel movement Do not strain to have a bowel movement Increase water intake Increase intake of fruits and vegetables Use miralax or stool softeners if needed Avoid stimulant laxatives  Patient verbalizes understanding of instructions provided today and agrees to view in [place].   Plan:Telephone follow up appointment with care management team member scheduled for:  09/09/21 with RNCM The patient has been provided with contact information for the care management team and has been advised to call with any health related questions or concerns.   Chong Sicilian, BSN, RN-BC Embedded Chronic Care Manager [facility] / [place] Management Direct Dial: [phone]

## 2021-08-05 NOTE — Chronic Care Management (AMB) (Signed)
Chronic Care Management   CCM RN Visit Note  08/02/2021 Name: Susan Contreras MRN: [id number] DOB: [birth year]  Subjective: Susan Contreras is a 80 y.o. year old female who is a primary care patient of [name], FNP. The care management team was consulted for assistance with disease management and care coordination needs.    Engaged with patient by telephone for follow up visit in response to provider referral for case management and/or care coordination services.   Consent to Services:  The patient was given information about Chronic Care Management services, agreed to services, and gave verbal consent prior to initiation of services.  Please see initial visit note for detailed documentation.   Patient agreed to services and verbal consent obtained.   Assessment: Review of patient past medical history, allergies, medications, health status, including review of consultants reports, laboratory and other test data, was performed as part of comprehensive evaluation and provision of chronic care management services.   SDOH (Social Determinants of Health) assessments and interventions performed:    CCM Care Plan  Allergies  Allergen Reactions   Biaxin [Clarithromycin]    Cephalexin    Ciprofloxacin    Doxycycline     Headaches   Flagyl [Metronidazole Hcl]    Ketek [Telithromycin]    Sulfa Antibiotics     Outpatient Encounter Medications as of 08/02/2021  Medication Sig   ALPRAZolam (XANAX) 0.25 MG tablet TAKE 1 TABLET TWICE DAILY AS NEEDED FOR ANXIETY   atorvastatin (LIPITOR) 20 MG tablet Take 1 tablet (20 mg total) by mouth daily.   Chlorphen-PE-Acetaminophen (NOREL AD PO) Take 1 tablet by mouth as needed.    Cholecalciferol (VITAMIN D3) 125 MCG (5000 UT) CAPS Take 1 capsule (5,000 Units total) by mouth daily.   famotidine (PEPCID) 40 MG tablet Take 1 tablet (40 mg total) by mouth daily.   fluticasone (FLONASE) 50 MCG/ACT nasal spray Place 1 spray into both nostrils daily.    irbesartan (AVAPRO) 75 MG tablet TAKE ONE TABLET ONCE DAILY   levothyroxine (SYNTHROID) 50 MCG tablet Take 1 tablet (50 mcg total) by mouth daily.   meclizine (ANTIVERT) 25 MG tablet TAKE ONE TABLET BY MOUTH THREE TIMES DAILY AS NEEDED FOR DIZZINESS   pantoprazole (PROTONIX) 20 MG tablet Take 1 tablet (20 mg total) by mouth 2 (two) times daily before a meal.   traZODone (DESYREL) 50 MG tablet Take 1 tablet (50 mg total) by mouth at bedtime.   No facility-administered encounter medications on file as of 08/02/2021.    Patient Active Problem List   Diagnosis Date Noted   Insomnia 05/07/2021   History of colonic polyps 04/26/2020   Benzodiazepine dependence ([place]) 05/19/2018   Controlled substance agreement signed 05/19/2018   Obesity (BMI 30.0-34.9) 44/92/0100   Metabolic syndrome 71/21/9758   GAD (generalized anxiety disorder) 08/14/2015   Allergic rhinitis 08/14/2015   GERD (gastroesophageal reflux disease) 08/14/2015   Hyperlipidemia 08/14/2015   Vitamin D deficiency 08/14/2015   Hypothyroidism 05/29/2014   Cholelithiasis 01/15/2011   Family history of colon cancer 01/15/2011   Hypertension 11/11/2007   ESOPHAGEAL STRICTURE 11/11/2007   Diaphragmatic hernia 11/11/2007   DIVERTICULOSIS OF COLON 11/11/2007    Conditions to be addressed/monitored:Anxiety, Hypothyroidism, and constipation  Care Plan : [facility] Care Plan     Problem: Chronic Disease Management Needs   Priority: High  Onset Date: 07/31/2021     Long-Range Goal: Work with RN Care Manager Regarding Care Coordination and Care Management Associated with HTN, GAD, GERD, HLD, Hypothyroidism,Depression  Start Date: 07/31/2021  Expected End Date: 07/31/2022  This Visit's Progress: On track  Recent Progress: On track  Priority: High  Note:   Current Barriers:  Chronic Disease Management support and education needs related to HTN, HLD, and GERD, hypothyroidism, depression, anxiety Anxiety Caregiver strain  RNCM Clinical  Goal(s):  Patient will continue to work with Consulting civil engineer and/or Social Worker to address care management and care coordination needs related to HTN, HLD, and GERD, hypothyroidism, depression, and anxiety as evidenced by adherence to CM Team Scheduled appointments     through collaboration with RN Care manager, provider, and care team.  Patient will keep appointment for screening mammogram and [facility] and follow-up as recommended  Interventions: 1:1 collaboration with primary care provider regarding development and update of comprehensive plan of care as evidenced by provider attestation and co-signature Inter-disciplinary care team collaboration (see longitudinal plan of care) Evaluation of current treatment plan related to  self management and patient's adherence to plan as established by provider   Health Maintenance (Status: Condition stable. Not addressed this visit.)  Short Term Goal  Patient interviewed about adult health maintenance status including Colonoscopy, pneumonia Vaccine, Yearly Influenza vaccine, COVID vaccinations, Mammogram   Advised patient to discuss Pneumonia Vaccine with primary care provider. Recommended Pneumovax after 08/07/21 Documented Historical Immunization accuracy confirmed  Patient provided CDC guideline information about Pneumonia vaccine Assisted in scheduling screening mammogram at [facility] on 08/07/21 at 11:30 Sent patient a My Chart Message with a copy of the appointment confirmation and advised to reach out to [facility] directly if she needs to reschedule   Hypothyroidism:  (Status: Condition stable. Not addressed this visit.) Long Term Goal  Reviewed and discussed medications and importance of compliance Reviewed and discussed most recent lab results and normal TSH Encouraged patient to f/u with PCP as recommended and to have TSH annually    Hypertension: (Status: Condition stable. Not addressed this visit.) Last practice recorded BP readings:  BP  Readings from Last 3 Encounters:  07/10/21 (!) 143/78  01/23/21 117/67  08/13/20 123/72  Most recent eGFR/CrCl:  Lab Results  Component Value Date   EGFR 40 (L) 07/10/2021    No components found for: CRCL  Evaluation of current treatment plan related to hypertension self management and patient's adherence to plan as established by provider;   Reviewed medications with patient and discussed importance of compliance;  Counseled on the importance of exercise goals with target of 150 minutes per week Discussed plans with patient for ongoing care management follow up and provided patient with direct contact information for care management team; Advised patient, providing education and rationale, to monitor blood pressure daily and record, calling PCP for findings outside established parameters;  Discussed complications of poorly controlled blood pressure such as heart disease, stroke, circulatory complications, vision complications, kidney impairment, sexual dysfunction;  Assessed social determinant of health barriers;    Hyperlipidemia:  (Status: Condition stable. Not addressed this visit.) Long Term Goal  Lab Results  Component Value Date   CHOL 225 (H) 04/13/2020   HDL 51 04/13/2020   LDLCALC 121 (H) 04/13/2020   TRIG 302 (H) 04/13/2020   CHOLHDL 4.4 04/13/2020   Medication review performed; medication list updated in electronic medical record.  Provider established cholesterol goals reviewed; Counseled on importance of regular laboratory monitoring as prescribed; Reviewed role and benefits of statin for ASCVD risk reduction; Reviewed importance of limiting foods high in cholesterol; Reviewed exercise goals and target of 150 minutes per week; Recommended having  lipid panel done at least once a year. Advised that it is overdue and was not checked at her last visit. Recommended that she have it at her next PCP visit.     GERD:  (Status: Condition stable. Not addressed this visit.)  Long Term Goal  Evaluation of treatment plan related to GERD and patient's adherence Reviewed and discussed medications and importance of compliance: Prevacid daily and prilosec twice daily Reviewed and discussed most recent GI visit and encouraged to f/u with GI if she continues to have GERD and dysphagia despite treatment with PPI and H2 blocker. Discussed diet and foods that make GERD worse Verbal education provided on ways to improve GERD symptoms   Constipation:  (Status: New goal.) Short Term Goal  Discussed symptoms of constipation Has had a bowel movement several days in a row but had great difficulty passing stool today. Required a lot of straining and effort. Stool was somewhat soft, bulky, and formed. Had small amount of bright red blood. Suspects hemorrhoids. Reviewed and discussed medications and treatments Cautioned against routine use of stimulant laxatives and provided reasoning  Encouraged use of stool softeners and/or miralax Encouraged increased water intake Discussed role of fiber in constipation prevention and management Verbal education on proper diet provided. Fruits and vegetables encouraged. Whole grains can be helpful it taken with enough water.  Verbal and written information provided on proper body positioning for bowel elimination Anatomy of large intestine and rectum explained Verbal education provided on pelvic floor dysfunction and it's role in constipation and pelvic/abdominal discomfort Recommended to consider pelvic flood physical therapy. Written information provided as well.  Discussed that GI follow-up may be needed if bleeding continues or constipation persists  Discussed most recent colonoscopies and although screening colonoscopies are necessary for her at this point, a diagnostic colonoscopy may be needed Advised to reach out to PCP with any new or worsening symptoms  Patient Goals/Self-Care Activities: Take medications as prescribed   Attend all  scheduled provider appointments Perform all self care activities independently  Perform IADL's (shopping, preparing meals, housekeeping, managing finances) independently Call provider office for new concerns or questions  check blood pressure 3 times per week write blood pressure results in a log or diary take blood pressure log to all doctor appointments call doctor for signs and symptoms of high blood pressure - take all medications exactly as prescribed Check lipid panel at next PCP visit Keep appointment for Mammogram on 08/07/21 at 11:30 at [place] foods that spicy or acid foods that cause GERD to worsen Call PCP or GI with any new or worsening symptoms Talk with LCSW regarding stress/anxiety Call RN Care Manager as needed [phone] Use a stool to put your feet on when having a bowel movement Do not strain to have a bowel movement Increase water intake Increase intake of fruits and vegetables Use miralax or stool softeners if needed Avoid stimulant laxatives  Plan:Telephone follow up appointment with care management team member scheduled for:  09/09/21 with RNCM The patient has been provided with contact information for the care management team and has been advised to call with any health related questions or concerns.   Chong Sicilian, BSN, RN-BC Embedded Chronic Care Manager [facility] / [place] Management Direct Dial: [phone]

## 2021-08-06 ENCOUNTER — Ambulatory Visit: Payer: Medicare HMO | Admitting: *Deleted

## 2021-08-06 DIAGNOSIS — F411 Generalized anxiety disorder: Secondary | ICD-10-CM

## 2021-08-06 DIAGNOSIS — E039 Hypothyroidism, unspecified: Secondary | ICD-10-CM

## 2021-08-06 NOTE — Patient Instructions (Signed)
Visit Information  Patient Goals/Self-Care Activities: Take medications as prescribed   Attend all scheduled provider appointments Perform all self care activities independently  Perform IADL's (shopping, preparing meals, housekeeping, managing finances) independently Call provider office for new concerns or questions  check blood pressure 3 times per week write blood pressure results in a log or diary take blood pressure log to all doctor appointments call doctor for signs and symptoms of high blood pressure - take all medications exactly as prescribed Check lipid panel at next PCP visit Keep appointment for Mammogram on 08/07/21 at 11:30 at [place] foods that spicy or acid foods that cause GERD to worsen Call PCP or GI with any new or worsening symptoms Talk with LCSW regarding stress/anxiety Call RN Care Manager as needed [phone] Use a stool to put your feet on when having a bowel movement Do not strain to have a bowel movement Increase water intake Increase intake of fruits and vegetables Use miralax or stool softeners if needed Avoid stimulant laxatives  Patient verbalizes understanding of instructions provided today and agrees to view in [place].   Plan:Telephone follow up appointment with care management team member scheduled for:  09/09/21 with RNCM The patient has been provided with contact information for the care management team and has been advised to call with any health related questions or concerns.   Chong Sicilian, BSN, RN-BC Embedded Chronic Care Manager [facility] / [place] [facility] Direct Dial: [phone]

## 2021-08-06 NOTE — Chronic Care Management (AMB) (Signed)
Chronic Care Management   CCM RN Visit Note  08/06/2021 Name: Susan Contreras MRN: [id number] DOB: Aug 15, 1941  Subjective: Susan Contreras is a 80 y.o. year old female who is a primary care patient of [name], FNP. The care management team was consulted for assistance with disease management and care coordination needs.    Engaged with patient by telephone for follow up visit in response to provider referral for case management and/or care coordination services.   Consent to Services:  The patient was given information about Chronic Care Management services, agreed to services, and gave verbal consent prior to initiation of services.  Please see initial visit note for detailed documentation.   Patient agreed to services and verbal consent obtained.   Assessment: Review of patient past medical history, allergies, medications, health status, including review of consultants reports, laboratory and other test data, was performed as part of comprehensive evaluation and provision of chronic care management services.   SDOH (Social Determinants of Health) assessments and interventions performed:    CCM Care Plan  Allergies  Allergen Reactions   Biaxin [Clarithromycin]    Cephalexin    Ciprofloxacin    Doxycycline     Headaches   Flagyl [Metronidazole Hcl]    Ketek [Telithromycin]    Sulfa Antibiotics     Outpatient Encounter Medications as of 08/06/2021  Medication Sig   ALPRAZolam (XANAX) 0.25 MG tablet TAKE 1 TABLET TWICE DAILY AS NEEDED FOR ANXIETY   atorvastatin (LIPITOR) 20 MG tablet Take 1 tablet (20 mg total) by mouth daily.   Chlorphen-PE-Acetaminophen (NOREL AD PO) Take 1 tablet by mouth as needed.    Cholecalciferol (VITAMIN D3) 125 MCG (5000 UT) CAPS Take 1 capsule (5,000 Units total) by mouth daily.   famotidine (PEPCID) 40 MG tablet Take 1 tablet (40 mg total) by mouth daily.   fluticasone (FLONASE) 50 MCG/ACT nasal spray Place 1 spray into both nostrils daily.    irbesartan (AVAPRO) 75 MG tablet TAKE ONE TABLET ONCE DAILY   levothyroxine (SYNTHROID) 50 MCG tablet Take 1 tablet (50 mcg total) by mouth daily.   meclizine (ANTIVERT) 25 MG tablet TAKE ONE TABLET BY MOUTH THREE TIMES DAILY AS NEEDED FOR DIZZINESS   pantoprazole (PROTONIX) 20 MG tablet Take 1 tablet (20 mg total) by mouth 2 (two) times daily before a meal.   traZODone (DESYREL) 50 MG tablet Take 1 tablet (50 mg total) by mouth at bedtime.   No facility-administered encounter medications on file as of 08/06/2021.    Patient Active Problem List   Diagnosis Date Noted   Insomnia 05/07/2021   History of colonic polyps 04/26/2020   Benzodiazepine dependence ([place]) 05/19/2018   Controlled substance agreement signed 05/19/2018   Obesity (BMI 30.0-34.9) 66/01/3015   Metabolic syndrome 09/02/3233   GAD (generalized anxiety disorder) 08/14/2015   Allergic rhinitis 08/14/2015   GERD (gastroesophageal reflux disease) 08/14/2015   Hyperlipidemia 08/14/2015   Vitamin D deficiency 08/14/2015   Hypothyroidism 05/29/2014   Cholelithiasis 01/15/2011   Family history of colon cancer 01/15/2011   Hypertension 11/11/2007   ESOPHAGEAL STRICTURE 11/11/2007   Diaphragmatic hernia 11/11/2007   DIVERTICULOSIS OF COLON 11/11/2007    Conditions to be addressed/monitored:Anxiety, Depression, and Hypothyroidism  Care Plan : [facility] Care Plan  Updates made by Ilean China, RN since 08/06/2021 12:00 AM     Problem: Chronic Disease Management Needs   Priority: High  Onset Date: 07/31/2021     Long-Range Goal: Work with RN Care Manager Regarding Care Coordination  and Care Management Associated with HTN, GAD, GERD, HLD, Hypothyroidism,Depression   Start Date: 07/31/2021  Expected End Date: 07/31/2022  This Visit's Progress: On track  Recent Progress: On track  Priority: High  Note:   Current Barriers:  Chronic Disease Management support and education needs related to HTN, HLD, and GERD, hypothyroidism,  depression, anxiety Anxiety Caregiver strain  RNCM Clinical Goal(s):  Patient will continue to work with Consulting civil engineer and/or Social Worker to address care management and care coordination needs related to HTN, HLD, and GERD, hypothyroidism, depression, and anxiety as evidenced by adherence to CM Team Scheduled appointments     through collaboration with RN Care manager, provider, and care team.  Patient will keep appointment for screening mammogram and [facility] and follow-up as recommended  Interventions: 1:1 collaboration with primary care provider regarding development and update of comprehensive plan of care as evidenced by provider attestation and co-signature Inter-disciplinary care team collaboration (see longitudinal plan of care) Evaluation of current treatment plan related to  self management and patient's adherence to plan as established by provider   Health Maintenance (Status: Goal on Track (progressing): YES.)  Short Term Goal  Patient interviewed about adult health maintenance status including Colonoscopy, pneumonia Vaccine, Yearly Influenza vaccine, COVID vaccinations, Mammogram   Advised patient to discuss Pneumonia Vaccine with primary care provider. Recommended Pneumovax after 08/07/21 Documented Historical Immunization accuracy confirmed  Patient provided CDC guideline information about Pneumonia vaccine Previously assisted in scheduling screening mammogram at [facility] on 08/07/21 at 11:30 and sent patient a My Chart Message with a copy of the appointment confirmation and advised to reach out to [facility] directly if she needs to reschedule Patient cancelled appointment Collaborated with [facility] staff to schedule patient on the mobile mammography unit at [facility] for 09/23/21   Hypothyroidism:  (Status: Condition stable. Not addressed this visit.) Long Term Goal  Reviewed and discussed medications and importance of compliance Reviewed and discussed most recent lab results and normal  TSH Encouraged patient to f/u with PCP as recommended and to have TSH annually    Hypertension: (Status: Condition stable. Not addressed this visit.) Last practice recorded BP readings:  BP Readings from Last 3 Encounters:  07/10/21 (!) 143/78  01/23/21 117/67  08/13/20 123/72  Most recent eGFR/CrCl:  Lab Results  Component Value Date   EGFR 40 (L) 07/10/2021    No components found for: CRCL  Evaluation of current treatment plan related to hypertension self management and patient's adherence to plan as established by provider;   Reviewed medications with patient and discussed importance of compliance;  Counseled on the importance of exercise goals with target of 150 minutes per week Discussed plans with patient for ongoing care management follow up and provided patient with direct contact information for care management team; Advised patient, providing education and rationale, to monitor blood pressure daily and record, calling PCP for findings outside established parameters;  Discussed complications of poorly controlled blood pressure such as heart disease, stroke, circulatory complications, vision complications, kidney impairment, sexual dysfunction;  Assessed social determinant of health barriers;    Hyperlipidemia:  (Status: Condition stable. Not addressed this visit.) Long Term Goal  Lab Results  Component Value Date   CHOL 225 (H) 04/13/2020   HDL 51 04/13/2020   LDLCALC 121 (H) 04/13/2020   TRIG 302 (H) 04/13/2020   CHOLHDL 4.4 04/13/2020   Medication review performed; medication list updated in electronic medical record.  Provider established cholesterol goals reviewed; Counseled on importance of regular laboratory monitoring  as prescribed; Reviewed role and benefits of statin for ASCVD risk reduction; Reviewed importance of limiting foods high in cholesterol; Reviewed exercise goals and target of 150 minutes per week; Recommended having lipid panel done at least once a  year. Advised that it is overdue and was not checked at her last visit. Recommended that she have it at her next PCP visit.     GERD:  (Status: Condition stable. Not addressed this visit.) Long Term Goal  Evaluation of treatment plan related to GERD and patient's adherence Reviewed and discussed medications and importance of compliance: Prevacid daily and prilosec twice daily Reviewed and discussed most recent GI visit and encouraged to f/u with GI if she continues to have GERD and dysphagia despite treatment with PPI and H2 blocker. Discussed diet and foods that make GERD worse Verbal education provided on ways to improve GERD symptoms   Constipation:  (Status: New goal.) Short Term Goal  Discussed symptoms of constipation Has had a bowel movement several days in a row but had great difficulty passing stool today. Required a lot of straining and effort. Stool was somewhat soft, bulky, and formed. Had small amount of bright red blood. Suspects hemorrhoids. Reviewed and discussed medications and treatments Cautioned against routine use of stimulant laxatives and provided reasoning  Encouraged use of stool softeners and/or miralax Encouraged increased water intake Discussed role of fiber in constipation prevention and management Verbal education on proper diet provided. Fruits and vegetables encouraged. Whole grains can be helpful it taken with enough water.  Verbal and written information provided on proper body positioning for bowel elimination Anatomy of large intestine and rectum explained Verbal education provided on pelvic floor dysfunction and it's role in constipation and pelvic/abdominal discomfort Recommended to consider pelvic flood physical therapy. Written information provided as well.  Discussed that GI follow-up may be needed if bleeding continues or constipation persists  Discussed most recent colonoscopies and although screening colonoscopies are necessary for her at this  point, a diagnostic colonoscopy may be needed Advised to reach out to PCP with any new or worsening symptoms  Patient Goals/Self-Care Activities: Take medications as prescribed   Attend all scheduled provider appointments Perform all self care activities independently  Perform IADL's (shopping, preparing meals, housekeeping, managing finances) independently Call provider office for new concerns or questions  check blood pressure 3 times per week write blood pressure results in a log or diary take blood pressure log to all doctor appointments call doctor for signs and symptoms of high blood pressure - take all medications exactly as prescribed Check lipid panel at next PCP visit Keep appointment for Mammogram on 08/07/21 at 11:30 at [place] foods that spicy or acid foods that cause GERD to worsen Call PCP or GI with any new or worsening symptoms Talk with LCSW regarding stress/anxiety Call RN Care Manager as needed [phone] Use a stool to put your feet on when having a bowel movement Do not strain to have a bowel movement Increase water intake Increase intake of fruits and vegetables Use miralax or stool softeners if needed Avoid stimulant laxatives  Plan:Telephone follow up appointment with care management team member scheduled for:  09/09/21 with RNCM The patient has been provided with contact information for the care management team and has been advised to call with any health related questions or concerns.   Chong Sicilian, BSN, RN-BC Embedded Chronic Care Manager [facility] / [place] Management Direct Dial: [phone]

## 2021-08-16 ENCOUNTER — Encounter: Payer: Self-pay | Admitting: Family

## 2021-08-16 ENCOUNTER — Ambulatory Visit (INDEPENDENT_AMBULATORY_CARE_PROVIDER_SITE_OTHER): Payer: Medicare HMO | Admitting: Family

## 2021-08-16 DIAGNOSIS — J0100 Acute maxillary sinusitis, unspecified: Secondary | ICD-10-CM

## 2021-08-16 MED ORDER — AMOXICILLIN 500 MG PO CAPS: 500.0000 mg | ORAL_CAPSULE | Freq: Three times a day (TID) | ORAL | 0 refills | Status: AC

## 2021-08-16 MED ORDER — FLUTICASONE PROPIONATE 50 MCG/ACT NA SUSP: 1.0000 | Freq: Every day | NASAL | 0 refills | Status: DC

## 2021-08-16 NOTE — Progress Notes (Signed)
° °  Virtual Visit  Note Due to COVID-19 pandemic this visit was conducted virtually. This visit type was conducted due to national recommendations for restrictions regarding the COVID-19 Pandemic (e.g. social distancing, sheltering in place) in an effort to limit this patient's exposure and mitigate transmission in our community. All issues noted in this document were discussed and addressed.  A physical exam was not performed with this format.  I connected with Susan Contreras on 08/16/21 at 10:19 AM  by telephone and verified that I am speaking with the correct person using two identifiers. Susan Contreras is currently located at home and no one is currently with her during visit. The provider, [name], FNP is located in their office at time of visit.  I discussed the limitations, risks, security and privacy concerns of performing an evaluation and management service by telephone and the availability of in person appointments. I also discussed with the patient that there may be a patient responsible charge related to this service. The patient expressed understanding and agreed to proceed.   History and Present Illness:  Sinusitis This is a new problem. The current episode started in the past 7 days. The problem has been gradually worsening since onset. There has been no fever. Her pain is at a severity of 8/10. The pain is moderate. Associated symptoms include chills, congestion, coughing, headaches, sinus pressure, sneezing and a sore throat. Pertinent negatives include no hoarse voice or neck pain. Past treatments include oral decongestants and acetaminophen. The treatment provided mild relief.     Review of Systems  Constitutional:  Positive for chills.  HENT:  Positive for congestion, sinus pressure, sneezing and sore throat. Negative for hoarse voice.   Respiratory:  Positive for cough.   Musculoskeletal:  Negative for neck pain.  Neurological:  Positive for headaches.     Observations/Objective: No SOB or distress noted  Assessment and Plan: 1. Acute maxillary sinusitis, recurrence not specified - Take meds as prescribed - Use a cool mist humidifier  -Use saline nose sprays frequently -Force fluids -For any cough or congestion  Use plain Mucinex- regular strength or max strength is fine -For fever or aces or pains- take tylenol or ibuprofen. -Throat lozenges if help -Follow up if symptoms worsen or do not improve  - amoxicillin (AMOXIL) 500 MG capsule; Take 1 capsule (500 mg total) by mouth 3 (three) times daily for 10 days.  Dispense: 30 capsule; Refill: 0     I discussed the assessment and treatment plan with the patient. The patient was provided an opportunity to ask questions and all were answered. The patient agreed with the plan and demonstrated an understanding of the instructions.   The patient was advised to call back or seek an in-person evaluation if the symptoms worsen or if the condition fails to improve as anticipated.  The above assessment and management plan was discussed with the patient. The patient verbalized understanding of and has agreed to the management plan. Patient is aware to call the clinic if symptoms persist or worsen. Patient is aware when to return to the clinic for a follow-up visit. Patient educated on when it is appropriate to go to the emergency department.   Time call ended:  10:30 AM   I provided 11 minutes of  non face-to-face time during this encounter.    [name], FNP

## 2021-08-23 ENCOUNTER — Other Ambulatory Visit: Payer: Self-pay | Admitting: Family

## 2021-08-23 DIAGNOSIS — Z1231 Encounter for screening mammogram for malignant neoplasm of breast: Secondary | ICD-10-CM

## 2021-08-28 ENCOUNTER — Ambulatory Visit (INDEPENDENT_AMBULATORY_CARE_PROVIDER_SITE_OTHER): Payer: Medicare HMO | Admitting: *Deleted

## 2021-08-28 DIAGNOSIS — E039 Hypothyroidism, unspecified: Secondary | ICD-10-CM

## 2021-08-28 DIAGNOSIS — I10 Essential (primary) hypertension: Secondary | ICD-10-CM

## 2021-08-29 ENCOUNTER — Ambulatory Visit: Payer: Medicare HMO | Admitting: *Deleted

## 2021-08-29 DIAGNOSIS — E039 Hypothyroidism, unspecified: Secondary | ICD-10-CM

## 2021-08-29 DIAGNOSIS — I10 Essential (primary) hypertension: Secondary | ICD-10-CM

## 2021-08-29 DIAGNOSIS — N1832 Chronic kidney disease, stage 3b: Secondary | ICD-10-CM

## 2021-08-30 ENCOUNTER — Other Ambulatory Visit: Payer: Self-pay | Admitting: Family

## 2021-08-30 DIAGNOSIS — N183 Chronic kidney disease, stage 3 unspecified: Secondary | ICD-10-CM | POA: Insufficient documentation

## 2021-08-30 DIAGNOSIS — N1832 Chronic kidney disease, stage 3b: Secondary | ICD-10-CM

## 2021-09-09 ENCOUNTER — Ambulatory Visit: Payer: Medicare HMO | Admitting: *Deleted

## 2021-09-09 ENCOUNTER — Encounter: Payer: Self-pay | Admitting: *Deleted

## 2021-09-09 DIAGNOSIS — N1832 Chronic kidney disease, stage 3b: Secondary | ICD-10-CM

## 2021-09-09 DIAGNOSIS — I10 Essential (primary) hypertension: Secondary | ICD-10-CM

## 2021-09-09 DIAGNOSIS — F411 Generalized anxiety disorder: Secondary | ICD-10-CM

## 2021-09-09 NOTE — Patient Instructions (Signed)
Visit Information  Patient Goals/Self-Care Activities: Take medications as prescribed   Attend all scheduled provider appointments Perform all self care activities independently  Perform IADL's (shopping, preparing meals, housekeeping, managing finances) independently Call provider office for new concerns or questions  check blood pressure 3 times per week write blood pressure results in a log or diary take blood pressure log to all doctor appointments call doctor for signs and symptoms of high blood pressure - take all medications exactly as prescribed Check lipid panel at next PCP visit Limit foods that spicy or acid foods that cause GERD to worsen Call PCP or GI with any new or worsening symptoms Talk with LCSW regarding stress/anxiety Call RN Care Manager as needed [phone] Use a stool to put your feet on when having a bowel movement Do not strain to have a bowel movement Increase water intake Increase intake of fruits and vegetables Use miralax or stool softeners if needed Avoid stimulant laxatives Move carefully and change positions slowly to decrease risk of falls Keep lip laceration clean and apply skin protectant, like vaseline Seek medical attention for any new or worsening symptoms  Increase activity level as tolerated with a goal of 150 minutes a week Call PCP with any new or worsening symptoms Take Vit D and can try a B complex vitamin Talk with PCP re: CKD diagnosis Talk with LCSW regarding anxiety and psychosocial concerns Decrease amount of news watched each day  Patient verbalizes understanding of instructions and care plan provided today and agrees to view in [place]. Active MyChart status confirmed with patient.    Plan:Telephone follow up appointment with care management team member scheduled for:  10/10/21 with RNCM The patient has been provided with contact information for the care management team and has been advised to call with any health related  questions or concerns.   Chong Sicilian, BSN, RN-BC Embedded Chronic Care Manager [facility] / [place] Management Direct Dial: [phone]

## 2021-09-09 NOTE — Patient Instructions (Signed)
Visit Information  Patient Goals/Self-Care Activities: Take medications as prescribed   Attend all scheduled provider appointments Perform all self care activities independently  Perform IADL's (shopping, preparing meals, housekeeping, managing finances) independently Call provider office for new concerns or questions  check blood pressure 3 times per week write blood pressure results in a log or diary take blood pressure log to all doctor appointments call doctor for signs and symptoms of high blood pressure - take all medications exactly as prescribed Check lipid panel at next PCP visit Limit foods that spicy or acid foods that cause GERD to worsen Call PCP or GI with any new or worsening symptoms Talk with LCSW regarding stress/anxiety Call RN Care Manager as needed [phone] Use a stool to put your feet on when having a bowel movement Do not strain to have a bowel movement Increase water intake Increase intake of fruits and vegetables Use miralax or stool softeners if needed Avoid stimulant laxatives Move carefully and change positions slowly to decrease risk of falls Keep lip laceration clean and apply skin protectant, like vaseline Seek medical attention for any new or worsening symptoms   Patient verbalizes understanding of instructions and care plan provided today and agrees to view in [place]. Active MyChart status confirmed with patient.    Plan:Telephone follow up appointment with care management team member scheduled for:  09/09/21 with RNCM The patient has been provided with contact information for the care management team and has been advised to call with any health related questions or concerns.   Chong Sicilian, BSN, RN-BC Embedded Chronic Care Manager [facility] / [place] Management Direct Dial: [phone]

## 2021-09-09 NOTE — Chronic Care Management (AMB) (Signed)
Chronic Care Management   CCM RN Visit Note  09/09/2021 Name: Susan Contreras MRN: [id number] DOB: [birth year]  Subjective: Susan Contreras is a 81 y.o. year old female who is a primary care patient of [name], FNP. The care management team was consulted for assistance with disease management and care coordination needs.    Engaged with patient by telephone for follow up visit in response to provider referral for case management and/or care coordination services.   Consent to Services:  The patient was given information about Chronic Care Management services, agreed to services, and gave verbal consent prior to initiation of services.  Please see initial visit note for detailed documentation.   Patient agreed to services and verbal consent obtained.   Assessment: Review of patient past medical history, allergies, medications, health status, including review of consultants reports, laboratory and other test data, was performed as part of comprehensive evaluation and provision of chronic care management services.   SDOH (Social Determinants of Health) assessments and interventions performed:    CCM Care Plan  Allergies  Allergen Reactions   Biaxin [Clarithromycin]    Cephalexin    Ciprofloxacin    Doxycycline     Headaches   Flagyl [Metronidazole Hcl]    Ketek [Telithromycin]    Sulfa Antibiotics     Outpatient Encounter Medications as of 09/09/2021  Medication Sig   ALPRAZolam (XANAX) 0.25 MG tablet TAKE 1 TABLET TWICE DAILY AS NEEDED FOR ANXIETY   atorvastatin (LIPITOR) 20 MG tablet Take 1 tablet (20 mg total) by mouth daily.   Chlorphen-PE-Acetaminophen (NOREL AD PO) Take 1 tablet by mouth as needed.    Cholecalciferol (VITAMIN D3) 125 MCG (5000 UT) CAPS Take 1 capsule (5,000 Units total) by mouth daily.   famotidine (PEPCID) 40 MG tablet Take 1 tablet (40 mg total) by mouth daily.   fluticasone (FLONASE) 50 MCG/ACT nasal spray Place 1 spray into both nostrils daily.    irbesartan (AVAPRO) 75 MG tablet TAKE ONE TABLET ONCE DAILY   levothyroxine (SYNTHROID) 50 MCG tablet Take 1 tablet (50 mcg total) by mouth daily.   meclizine (ANTIVERT) 25 MG tablet TAKE ONE TABLET BY MOUTH THREE TIMES DAILY AS NEEDED FOR DIZZINESS   pantoprazole (PROTONIX) 20 MG tablet Take 1 tablet (20 mg total) by mouth 2 (two) times daily before a meal.   traZODone (DESYREL) 50 MG tablet Take 1 tablet (50 mg total) by mouth at bedtime.   No facility-administered encounter medications on file as of 09/09/2021.    Patient Active Problem List   Diagnosis Date Noted   Chronic kidney disease, stage 3 ([place]) 08/30/2021   Insomnia 05/07/2021   History of colonic polyps 04/26/2020   Benzodiazepine dependence ([place]) 05/19/2018   Controlled substance agreement signed 05/19/2018   Obesity (BMI 30.0-34.9) 20/94/7096   Metabolic syndrome 28/36/6294   GAD (generalized anxiety disorder) 08/14/2015   Allergic rhinitis 08/14/2015   GERD (gastroesophageal reflux disease) 08/14/2015   Hyperlipidemia 08/14/2015   Vitamin D deficiency 08/14/2015   Hypothyroidism 05/29/2014   Cholelithiasis 01/15/2011   Family history of colon cancer 01/15/2011   Hypertension 11/11/2007   ESOPHAGEAL STRICTURE 11/11/2007   Diaphragmatic hernia 11/11/2007   DIVERTICULOSIS OF COLON 11/11/2007    Conditions to be addressed/monitored:CKD Stage 3 and Anxiety  Care Plan : [facility] Care Plan  Updates made by Ilean China, RN since 09/09/2021 12:00 AM     Problem: Chronic Disease Management Needs   Priority: High  Onset Date: 07/31/2021  Long-Range Goal: Work with Consulting civil engineer Regarding Care Coordination and Care Management Associated with HTN, GAD, GERD, HLD, Hypothyroidism,Depression, CKD   Start Date: 07/31/2021  Expected End Date: 07/31/2022  This Visit's Progress: On track  Recent Progress: On track  Priority: High  Note:   Current Barriers:  Chronic Disease Management support and education needs related  to HTN, HLD, and GERD, hypothyroidism, depression, CKD, anxiety Caregiver strain  RNCM Clinical Goal(s):  Patient will continue to work with Consulting civil engineer and/or Social Worker to address care management and care coordination needs related to HTN, HLD, and GERD, hypothyroidism, depression, and anxiety as evidenced by adherence to CM Team Scheduled appointments     through collaboration with RN Care manager, provider, and care team.  Patient will demonstrate improved self health maintenance by keeping all medical appointments  Interventions: 1:1 collaboration with primary care provider regarding development and update of comprehensive plan of care as evidenced by provider attestation and co-signature Inter-disciplinary care team collaboration (see longitudinal plan of care) Evaluation of current treatment plan related to  self management and patient's adherence to plan as established by provider   Health Maintenance (Status: Condition stable. Not addressed this visit.)  Short Term Goal  Patient interviewed about adult health maintenance status including Colonoscopy, pneumonia Vaccine, Yearly Influenza vaccine, COVID vaccinations, Mammogram   Advised patient to discuss Pneumonia Vaccine with primary care provider. Recommended Pneumovax after 08/07/21 Documented Historical Immunization accuracy confirmed  Patient provided CDC guideline information about Pneumonia vaccine Previously assisted in scheduling screening mammogram at [facility] on 08/07/21 at 11:30 and sent patient a My Chart Message with a copy of the appointment confirmation and advised to reach out to [facility] directly if she needs to reschedule Patient cancelled appointment Collaborated with [facility] staff to schedule patient on the mobile mammography unit at [facility] for 09/23/21   Hypothyroidism:  (Status: Condition stable. Not addressed this visit.) Long Term Goal  Lab Results  Component Value Date   TSH 3.080 07/10/2021  Reviewed and  discussed medications and importance of compliance Reviewed and discussed most recent lab results and normal TSH Encouraged patient to f/u with PCP and endocrinologist as recommended and to have TSH rechecked as instructed Discussed low energy and fatigue. Encouraged to increase activity level as tolerated   Hypertension: (Status: Condition stable. Not addressed this visit.) Last practice recorded BP readings:  BP Readings from Last 3 Encounters:  07/10/21 (!) 143/78  01/23/21 117/67  08/13/20 123/72   Most recent eGFR/CrCl:  Lab Results  Component Value Date   EGFR 40 (L) 07/10/2021    No components found for: CRCL  Evaluation of current treatment plan related to hypertension self management and patient's adherence to plan as established by provider;   Reviewed medications with patient and discussed importance of compliance;  Counseled on the importance of exercise goals with target of 150 minutes per week Discussed plans with patient for ongoing care management follow up and provided patient with direct contact information for care management team; Advised patient, providing education and rationale, to monitor blood pressure daily and record, calling PCP for findings outside established parameters;  Discussed complications of poorly controlled blood pressure such as heart disease, stroke, circulatory complications, vision complications, kidney impairment, sexual dysfunction;  Assessed social determinant of health barriers;  Monitor for s/s of hypotension r/t blood pressure medication   Hyperlipidemia:  (Status: Condition stable. Not addressed this visit.) Long Term Goal  Lab Results  Component Value Date   CHOL 225 (H)  04/13/2020   HDL 51 04/13/2020   LDLCALC 121 (H) 04/13/2020   TRIG 302 (H) 04/13/2020   CHOLHDL 4.4 04/13/2020   Medication review performed; medication list updated in electronic medical record.  Provider established cholesterol goals reviewed; Counseled on  importance of regular laboratory monitoring as prescribed; Reviewed role and benefits of statin for ASCVD risk reduction; Reviewed importance of limiting foods high in cholesterol; Reviewed exercise goals and target of 150 minutes per week; Recommended having lipid panel done at least once a year. Advised that it is overdue and was not checked at her last visit. Recommended that she have it at her next PCP visit.     GERD:  (Status: Condition stable. Not addressed this visit.) Long Term Goal  Evaluation of treatment plan related to GERD and patient's adherence Reviewed and discussed medications and importance of compliance: Prevacid daily and prilosec twice daily Reviewed and discussed most recent GI visit and encouraged to f/u with GI if she continues to have GERD and dysphagia despite treatment with PPI and H2 blocker. Discussed diet and foods that make GERD worse Verbal education provided on ways to improve GERD symptoms   Constipation:  (Status: Condition stable. Not addressed this visit.) Short Term Goal  Discussed symptoms of constipation Has had a bowel movement several days in a row but had great difficulty passing stool today. Required a lot of straining and effort. Stool was somewhat soft, bulky, and formed. Had small amount of bright red blood. Suspects hemorrhoids. Reviewed and discussed medications and treatments Cautioned against routine use of stimulant laxatives and provided reasoning  Encouraged use of stool softeners and/or miralax Encouraged increased water intake Discussed role of fiber in constipation prevention and management Verbal education on proper diet provided. Fruits and vegetables encouraged. Whole grains can be helpful it taken with enough water.  Verbal and written information provided on proper body positioning for bowel elimination Anatomy of large intestine and rectum explained Verbal education provided on pelvic floor dysfunction and it's role in  constipation and pelvic/abdominal discomfort Recommended to consider pelvic flood physical therapy. Written information provided as well.  Discussed that GI follow-up may be needed if bleeding continues or constipation persists  Discussed most recent colonoscopies and although screening colonoscopies are necessary for her at this point, a diagnostic colonoscopy may be needed Advised to reach out to PCP with any new or worsening symptoms   Falls:  (Status: Goal on Track (progressing): YES.) Long Term Goal  Reviewed medications and discussed potential side effects of medications such as dizziness and frequent urination Advised patient of importance of notifying provider of falls Assessed for signs and symptoms of orthostatic hypotension Assessed for falls since last encounter Discussed mechanical fall 2 weeks ago on her deck where she tripped over a nail. No head injury and no loss of consciousness. Denies headache or dizziness. Her lip is healing. No additional complaints related to fall. Encouraged to be aware of surroundings and to move carefully and change positions slowly to decrease risk for falls   Chronic Kidney Disease (Status: Goal on Track (progressing): YES.)  Long Term Goal  Last practice recorded BP readings:  BP Readings from Last 3 Encounters:  07/10/21 (!) 143/78  01/23/21 117/67  08/13/20 123/72  Most recent eGFR/CrCl:  Lab Results  Component Value Date   EGFR 40 (L) 07/10/2021    No components found for: CRCL  Lab Results  Component Value Date   CREATININE 1.37 (H) 07/10/2021   BUN 10 07/10/2021  NA 139 07/10/2021   K 4.4 07/10/2021   CL 99 07/10/2021   CO2 20 07/10/2021   Assessed the patient     understanding of chronic kidney disease    Evaluation of current treatment plan related to chronic kidney disease self management and patient's adherence to plan as established by provider      Reviewed medications with patient and discussed importance of compliance     Counseled on the importance of exercise goals with target of 150 minutes per week     Collaborated with PCP to add CKD stage 3 to problem list based on elevated creatinine and decreased GFR Support coping and stress management by recognizing current strategies and assist in developing new strategies such as mindfulness, journaling, relaxation techniques, problem-solving     Discussed that fatigue can be a symptom of CKD Encouraged patient to increase activity level as tolerated and to rest as needed Reviewed and discussed recent lab results, including CBC. Patient does not appear to have anemia related to CKD Reviewed upcoming appointments Encouraged patient to reach out to PCP with any new or worsening symptoms Therapeutic listening utilized regarding health related anxiety and diagnosis of CKD Discussed OTC vitamins. Patient has restarted vit D and does feel that it's helping her energy level some Questions answered regarding potential b12 deficiency. Reviewed available labs and advised that mots recent b12 levels were normal Recommended a B Complex vitamin if she would like to try it. May help with energy. Specific B12 supplement or injections aren't needed since levels are normal and wouldn't likely be effective   Anxiety:  (Status: New goal.) Long Term Goal  Therapeutic listening utilized regarding personal and family health related anxiety as well as generalized anxiety related to news reports and current affairs Encouraged to talk with LCSW and reviewed upcoming appointment Recommended that she decrease the amount of news reports that she watches each day and explained that those can increase anxiety in a person that is prone to excessive worrying   Patient Goals/Self-Care Activities: Take medications as prescribed   Attend all scheduled provider appointments Perform all self care activities independently  Perform IADL's (shopping, preparing meals, housekeeping, managing finances)  independently Call provider office for new concerns or questions  check blood pressure 3 times per week write blood pressure results in a log or diary take blood pressure log to all doctor appointments call doctor for signs and symptoms of high blood pressure - take all medications exactly as prescribed Check lipid panel at next PCP visit Limit foods that spicy or acid foods that cause GERD to worsen Call PCP or GI with any new or worsening symptoms Talk with LCSW regarding stress/anxiety Call RN Care Manager as needed [phone] Use a stool to put your feet on when having a bowel movement Do not strain to have a bowel movement Increase water intake Increase intake of fruits and vegetables Use miralax or stool softeners if needed Avoid stimulant laxatives Move carefully and change positions slowly to decrease risk of falls Keep lip laceration clean and apply skin protectant, like vaseline Seek medical attention for any new or worsening symptoms  Increase activity level as tolerated with a goal of 150 minutes a week Call PCP with any new or worsening symptoms Take Vit D and can try a B complex vitamin Talk with PCP re: CKD diagnosis Talk with LCSW regarding anxiety and psychosocial concerns Decrease amount of news watched each day  Plan:Telephone follow up appointment with care management team member  scheduled for:  10/10/21 with RNCM The patient has been provided with contact information for the care management team and has been advised to call with any health related questions or concerns.   Chong Sicilian, BSN, RN-BC Embedded Chronic Care Manager [facility] [place] [facility] Direct Dial: [phone]

## 2021-09-09 NOTE — Chronic Care Management (AMB) (Signed)
Chronic Care Management   CCM RN Visit Note  08/29/2021 Name: Susan Contreras MRN: [id number] DOB: 1941/05/19  Subjective: Susan Contreras is a 81 y.o. year old female who is a primary care patient of [name], FNP. The care management team was consulted for assistance with disease management and care coordination needs.    Engaged with patient by telephone for follow up visit in response to provider referral for case management and/or care coordination services.   Consent to Services:  The patient was given information about Chronic Care Management services, agreed to services, and gave verbal consent prior to initiation of services.  Please see initial visit note for detailed documentation.   Patient agreed to services and verbal consent obtained.   Assessment: Review of patient past medical history, allergies, medications, health status, including review of consultants reports, laboratory and other test data, was performed as part of comprehensive evaluation and provision of chronic care management services.   SDOH (Social Determinants of Health) assessments and interventions performed:    CCM Care Plan  Allergies  Allergen Reactions   Biaxin [Clarithromycin]    Cephalexin    Ciprofloxacin    Doxycycline     Headaches   Flagyl [Metronidazole Hcl]    Ketek [Telithromycin]    Sulfa Antibiotics     Outpatient Encounter Medications as of 08/29/2021  Medication Sig   ALPRAZolam (XANAX) 0.25 MG tablet TAKE 1 TABLET TWICE DAILY AS NEEDED FOR ANXIETY   atorvastatin (LIPITOR) 20 MG tablet Take 1 tablet (20 mg total) by mouth daily.   Chlorphen-PE-Acetaminophen (NOREL AD PO) Take 1 tablet by mouth as needed.    Cholecalciferol (VITAMIN D3) 125 MCG (5000 UT) CAPS Take 1 capsule (5,000 Units total) by mouth daily.   famotidine (PEPCID) 40 MG tablet Take 1 tablet (40 mg total) by mouth daily.   fluticasone (FLONASE) 50 MCG/ACT nasal spray Place 1 spray into both nostrils daily.    irbesartan (AVAPRO) 75 MG tablet TAKE ONE TABLET ONCE DAILY   levothyroxine (SYNTHROID) 50 MCG tablet Take 1 tablet (50 mcg total) by mouth daily.   meclizine (ANTIVERT) 25 MG tablet TAKE ONE TABLET BY MOUTH THREE TIMES DAILY AS NEEDED FOR DIZZINESS   pantoprazole (PROTONIX) 20 MG tablet Take 1 tablet (20 mg total) by mouth 2 (two) times daily before a meal.   traZODone (DESYREL) 50 MG tablet Take 1 tablet (50 mg total) by mouth at bedtime.   No facility-administered encounter medications on file as of 08/29/2021.    Patient Active Problem List   Diagnosis Date Noted   Chronic kidney disease, stage 3 ([place]) 08/30/2021   Insomnia 05/07/2021   History of colonic polyps 04/26/2020   Benzodiazepine dependence ([place]) 05/19/2018   Controlled substance agreement signed 05/19/2018   Obesity (BMI 30.0-34.9) 30/86/5784   Metabolic syndrome 69/62/9528   GAD (generalized anxiety disorder) 08/14/2015   Allergic rhinitis 08/14/2015   GERD (gastroesophageal reflux disease) 08/14/2015   Hyperlipidemia 08/14/2015   Vitamin D deficiency 08/14/2015   Hypothyroidism 05/29/2014   Cholelithiasis 01/15/2011   Family history of colon cancer 01/15/2011   Hypertension 11/11/2007   ESOPHAGEAL STRICTURE 11/11/2007   Diaphragmatic hernia 11/11/2007   DIVERTICULOSIS OF COLON 11/11/2007    Conditions to be addressed/monitored:HTN, CKD Stage 3, and Hypothyroidism  Care Plan : [facility] Care Plan     Problem: Chronic Disease Management Needs   Priority: High  Onset Date: 07/31/2021     Long-Range Goal: Work with RN Care Manager Regarding Care Coordination  and Care Management Associated with HTN, GAD, GERD, HLD, Hypothyroidism,Depression, CKD   Start Date: 07/31/2021  Expected End Date: 07/31/2022  Recent Progress: On track  Priority: High  Note:   Current Barriers:  Chronic Disease Management support and education needs related to HTN, HLD, and GERD, hypothyroidism, depression, CKD, anxiety Caregiver  strain  RNCM Clinical Goal(s):  Patient will continue to work with Consulting civil engineer and/or Social Worker to address care management and care coordination needs related to HTN, HLD, and GERD, hypothyroidism, depression, and anxiety as evidenced by adherence to CM Team Scheduled appointments     through collaboration with RN Care manager, provider, and care team.  Patient will demonstrate improved self health maintenance by keeping all medical appointments  Interventions: 1:1 collaboration with primary care provider regarding development and update of comprehensive plan of care as evidenced by provider attestation and co-signature Inter-disciplinary care team collaboration (see longitudinal plan of care) Evaluation of current treatment plan related to  self management and patient's adherence to plan as established by provider   Health Maintenance (Status: Condition stable. Not addressed this visit.)  Short Term Goal  Patient interviewed about adult health maintenance status including Colonoscopy, pneumonia Vaccine, Yearly Influenza vaccine, COVID vaccinations, Mammogram   Advised patient to discuss Pneumonia Vaccine with primary care provider. Recommended Pneumovax after 08/07/21 Documented Historical Immunization accuracy confirmed  Patient provided CDC guideline information about Pneumonia vaccine Previously assisted in scheduling screening mammogram at [facility] on 08/07/21 at 11:30 and sent patient a My Chart Message with a copy of the appointment confirmation and advised to reach out to [facility] directly if she needs to reschedule Patient cancelled appointment Collaborated with [facility] staff to schedule patient on the mobile mammography unit at [facility] for 09/23/21   Hypothyroidism:  (Status: Goal on Track (progressing): YES.) Long Term Goal  Lab Results  Component Value Date   TSH 3.080 07/10/2021  Reviewed and discussed medications and importance of compliance Reviewed and discussed most recent lab  results and normal TSH Encouraged patient to f/u with PCP and endocrinologist as recommended and to have TSH rechecked as instructed Discussed low energy and fatigue. Encouraged to increase activity level as tolerated   Hypertension: (Status: Condition stable. Not addressed this visit.) Last practice recorded BP readings:  BP Readings from Last 3 Encounters:  07/10/21 (!) 143/78  01/23/21 117/67  08/13/20 123/72   Most recent eGFR/CrCl:  Lab Results  Component Value Date   EGFR 40 (L) 07/10/2021    No components found for: CRCL  Evaluation of current treatment plan related to hypertension self management and patient's adherence to plan as established by provider;   Reviewed medications with patient and discussed importance of compliance;  Counseled on the importance of exercise goals with target of 150 minutes per week Discussed plans with patient for ongoing care management follow up and provided patient with direct contact information for care management team; Advised patient, providing education and rationale, to monitor blood pressure daily and record, calling PCP for findings outside established parameters;  Discussed complications of poorly controlled blood pressure such as heart disease, stroke, circulatory complications, vision complications, kidney impairment, sexual dysfunction;  Assessed social determinant of health barriers;  Monitor for s/s of hypotension r/t blood pressure medication   Hyperlipidemia:  (Status: Condition stable. Not addressed this visit.) Long Term Goal  Lab Results  Component Value Date   CHOL 225 (H) 04/13/2020   HDL 51 04/13/2020   LDLCALC 121 (H) 04/13/2020   TRIG 302 (H)  04/13/2020   CHOLHDL 4.4 04/13/2020   Medication review performed; medication list updated in electronic medical record.  Provider established cholesterol goals reviewed; Counseled on importance of regular laboratory monitoring as prescribed; Reviewed role and benefits of  statin for ASCVD risk reduction; Reviewed importance of limiting foods high in cholesterol; Reviewed exercise goals and target of 150 minutes per week; Recommended having lipid panel done at least once a year. Advised that it is overdue and was not checked at her last visit. Recommended that she have it at her next PCP visit.     GERD:  (Status: Condition stable. Not addressed this visit.) Long Term Goal  Evaluation of treatment plan related to GERD and patient's adherence Reviewed and discussed medications and importance of compliance: Prevacid daily and prilosec twice daily Reviewed and discussed most recent GI visit and encouraged to f/u with GI if she continues to have GERD and dysphagia despite treatment with PPI and H2 blocker. Discussed diet and foods that make GERD worse Verbal education provided on ways to improve GERD symptoms   Constipation:  (Status: Condition stable. Not addressed this visit.) Short Term Goal  Discussed symptoms of constipation Has had a bowel movement several days in a row but had great difficulty passing stool today. Required a lot of straining and effort. Stool was somewhat soft, bulky, and formed. Had small amount of bright red blood. Suspects hemorrhoids. Reviewed and discussed medications and treatments Cautioned against routine use of stimulant laxatives and provided reasoning  Encouraged use of stool softeners and/or miralax Encouraged increased water intake Discussed role of fiber in constipation prevention and management Verbal education on proper diet provided. Fruits and vegetables encouraged. Whole grains can be helpful it taken with enough water.  Verbal and written information provided on proper body positioning for bowel elimination Anatomy of large intestine and rectum explained Verbal education provided on pelvic floor dysfunction and it's role in constipation and pelvic/abdominal discomfort Recommended to consider pelvic flood physical  therapy. Written information provided as well.  Discussed that GI follow-up may be needed if bleeding continues or constipation persists  Discussed most recent colonoscopies and although screening colonoscopies are necessary for her at this point, a diagnostic colonoscopy may be needed Advised to reach out to PCP with any new or worsening symptoms   Falls:  (Status: Goal on Track (progressing): YES.) Long Term Goal  Reviewed medications and discussed potential side effects of medications such as dizziness and frequent urination Advised patient of importance of notifying provider of falls Assessed for signs and symptoms of orthostatic hypotension Assessed for falls since last encounter Discussed mechanical fall earlier today. No head injury and no loss of consciousness. Denies headache or dizziness. She does have a busted lip but it is not gaping open. Doesn't feel sutures are needed. Encouraged to keep area clean and can apply skin protectant, like vaseline Encouraged to be aware of surroundings and to move carefully and change positions slowly to decrease risk for falls   Chronic Kidney Disease (Status: New goal.)  Long Term Goal  Last practice recorded BP readings:  BP Readings from Last 3 Encounters:  07/10/21 (!) 143/78  01/23/21 117/67  08/13/20 123/72  Most recent eGFR/CrCl:  Lab Results  Component Value Date   EGFR 40 (L) 07/10/2021    No components found for: CRCL  Lab Results  Component Value Date   CREATININE 1.37 (H) 07/10/2021   BUN 10 07/10/2021   NA 139 07/10/2021   K 4.4 07/10/2021  CL 99 07/10/2021   CO2 20 07/10/2021   Assessed the patient     understanding of chronic kidney disease    Evaluation of current treatment plan related to chronic kidney disease self management and patient's adherence to plan as established by provider      Reviewed medications with patient and discussed importance of compliance    Counseled on the importance of exercise goals with  target of 150 minutes per week     Collaborated with PCP to add CKD stage 3 to problem list based on elevated creatinine and decreased GFR Support coping and stress management by recognizing current strategies and assist in developing new strategies such as mindfulness, journaling, relaxation techniques, problem-solving     Discussed that fatigue can be a symptom of CKD Encouraged patient to increase activity level as tolerated and to rest as needed Reviewed and discussed recent lab results, including CBC. Patient does not appear to have anemia related to CKD Reviewed upcoming appointments Encouraged patient to reach out to PCP with any new or worsening symptoms   Patient Goals/Self-Care Activities: Take medications as prescribed   Attend all scheduled provider appointments Perform all self care activities independently  Perform IADL's (shopping, preparing meals, housekeeping, managing finances) independently Call provider office for new concerns or questions  check blood pressure 3 times per week write blood pressure results in a log or diary take blood pressure log to all doctor appointments call doctor for signs and symptoms of high blood pressure - take all medications exactly as prescribed Check lipid panel at next PCP visit Limit foods that spicy or acid foods that cause GERD to worsen Call PCP or GI with any new or worsening symptoms Talk with LCSW regarding stress/anxiety Call RN Care Manager as needed [phone] Use a stool to put your feet on when having a bowel movement Do not strain to have a bowel movement Increase water intake Increase intake of fruits and vegetables Use miralax or stool softeners if needed Avoid stimulant laxatives Move carefully and change positions slowly to decrease risk of falls Keep lip laceration clean and apply skin protectant, like vaseline Seek medical attention for any new or worsening symptoms  Increase activity level as tolerated with a  goal of 150 minutes a week Call PCP with any new or worsening symptoms  Plan:Telephone follow up appointment with care management team member scheduled for:  09/09/2021 with RNCM The patient has been provided with contact information for the care management team and has been advised to call with any health related questions or concerns.   Chong Sicilian, BSN, RN-BC Embedded Chronic Care Manager [facility] [place] [facility] Direct Dial: [phone]

## 2021-09-09 NOTE — Chronic Care Management (AMB) (Signed)
Chronic Care Management   CCM RN Visit Note  08/28/2021 Name: Susan Contreras MRN: [id number] DOB: 01-22-1941  Subjective: Susan Contreras is a 81 y.o. year old female who is a primary care patient of [name], FNP. The care management team was consulted for assistance with disease management and care coordination needs.    Engaged with patient by telephone for follow up visit in response to provider referral for case management and/or care coordination services.   Consent to Services:  The patient was given information about Chronic Care Management services, agreed to services, and gave verbal consent prior to initiation of services.  Please see initial visit note for detailed documentation.   Patient agreed to services and verbal consent obtained.   Assessment: Review of patient past medical history, allergies, medications, health status, including review of consultants reports, laboratory and other test data, was performed as part of comprehensive evaluation and provision of chronic care management services.   SDOH (Social Determinants of Health) assessments and interventions performed:    CCM Care Plan  Allergies  Allergen Reactions   Biaxin [Clarithromycin]    Cephalexin    Ciprofloxacin    Doxycycline     Headaches   Flagyl [Metronidazole Hcl]    Ketek [Telithromycin]    Sulfa Antibiotics     Outpatient Encounter Medications as of 08/28/2021  Medication Sig   ALPRAZolam (XANAX) 0.25 MG tablet TAKE 1 TABLET TWICE DAILY AS NEEDED FOR ANXIETY   atorvastatin (LIPITOR) 20 MG tablet Take 1 tablet (20 mg total) by mouth daily.   Chlorphen-PE-Acetaminophen (NOREL AD PO) Take 1 tablet by mouth as needed.    Cholecalciferol (VITAMIN D3) 125 MCG (5000 UT) CAPS Take 1 capsule (5,000 Units total) by mouth daily.   famotidine (PEPCID) 40 MG tablet Take 1 tablet (40 mg total) by mouth daily.   fluticasone (FLONASE) 50 MCG/ACT nasal spray Place 1 spray into both nostrils daily.    irbesartan (AVAPRO) 75 MG tablet TAKE ONE TABLET ONCE DAILY   levothyroxine (SYNTHROID) 50 MCG tablet Take 1 tablet (50 mcg total) by mouth daily.   meclizine (ANTIVERT) 25 MG tablet TAKE ONE TABLET BY MOUTH THREE TIMES DAILY AS NEEDED FOR DIZZINESS   pantoprazole (PROTONIX) 20 MG tablet Take 1 tablet (20 mg total) by mouth 2 (two) times daily before a meal.   traZODone (DESYREL) 50 MG tablet Take 1 tablet (50 mg total) by mouth at bedtime.   No facility-administered encounter medications on file as of 08/28/2021.    Patient Active Problem List   Diagnosis Date Noted   Chronic kidney disease, stage 3 ([place]) 08/30/2021   Insomnia 05/07/2021   History of colonic polyps 04/26/2020   Benzodiazepine dependence ([place]) 05/19/2018   Controlled substance agreement signed 05/19/2018   Obesity (BMI 30.0-34.9) 29/93/7169   Metabolic syndrome 67/89/3810   GAD (generalized anxiety disorder) 08/14/2015   Allergic rhinitis 08/14/2015   GERD (gastroesophageal reflux disease) 08/14/2015   Hyperlipidemia 08/14/2015   Vitamin D deficiency 08/14/2015   Hypothyroidism 05/29/2014   Cholelithiasis 01/15/2011   Family history of colon cancer 01/15/2011   Hypertension 11/11/2007   ESOPHAGEAL STRICTURE 11/11/2007   Diaphragmatic hernia 11/11/2007   DIVERTICULOSIS OF COLON 11/11/2007    Conditions to be addressed/monitored:HTN, Anxiety, and Hypothyroidism  Care Plan : [facility] Care Plan     Problem: Chronic Disease Management Needs   Priority: High  Onset Date: 07/31/2021     Long-Range Goal: Work with RN Care Manager Regarding Care Coordination and Care Management  Associated with HTN, GAD, GERD, HLD, Hypothyroidism,Depression   Start Date: 07/31/2021  Expected End Date: 07/31/2022  This Visit's Progress: On track  Recent Progress: On track  Priority: High  Note:   Current Barriers:  Chronic Disease Management support and education needs related to HTN, HLD, and GERD, hypothyroidism, depression,  anxiety Anxiety Caregiver strain  RNCM Clinical Goal(s):  Patient will continue to work with Consulting civil engineer and/or Social Worker to address care management and care coordination needs related to HTN, HLD, and GERD, hypothyroidism, depression, and anxiety as evidenced by adherence to CM Team Scheduled appointments     through collaboration with RN Care manager, provider, and care team.  Patient will demonstrate improved self health maintenance by keeping all medical appointments  Interventions: 1:1 collaboration with primary care provider regarding development and update of comprehensive plan of care as evidenced by provider attestation and co-signature Inter-disciplinary care team collaboration (see longitudinal plan of care) Evaluation of current treatment plan related to  self management and patient's adherence to plan as established by provider   Health Maintenance (Status: Condition stable. Not addressed this visit.)  Short Term Goal  Patient interviewed about adult health maintenance status including Colonoscopy, pneumonia Vaccine, Yearly Influenza vaccine, COVID vaccinations, Mammogram   Advised patient to discuss Pneumonia Vaccine with primary care provider. Recommended Pneumovax after 08/07/21 Documented Historical Immunization accuracy confirmed  Patient provided CDC guideline information about Pneumonia vaccine Previously assisted in scheduling screening mammogram at [facility] on 08/07/21 at 11:30 and sent patient a My Chart Message with a copy of the appointment confirmation and advised to reach out to [facility] directly if she needs to reschedule Patient cancelled appointment Collaborated with [facility] staff to schedule patient on the mobile mammography unit at [facility] for 09/23/21   Hypothyroidism:  (Status: Goal on Track (progressing): YES.) Long Term Goal  Lab Results  Component Value Date   TSH 3.080 07/10/2021  Reviewed and discussed medications and importance of compliance Reviewed  and discussed most recent lab results and normal TSH Encouraged patient to f/u with PCP and endocrinologist as recommended and to have TSH rechecked as instructed   Hypertension: (Status: Goal on Track (progressing): YES.) Last practice recorded BP readings:  BP Readings from Last 3 Encounters:  07/10/21 (!) 143/78  01/23/21 117/67  08/13/20 123/72   Most recent eGFR/CrCl:  Lab Results  Component Value Date   EGFR 40 (L) 07/10/2021    No components found for: CRCL  Evaluation of current treatment plan related to hypertension self management and patient's adherence to plan as established by provider;   Reviewed medications with patient and discussed importance of compliance;  Counseled on the importance of exercise goals with target of 150 minutes per week Discussed plans with patient for ongoing care management follow up and provided patient with direct contact information for care management team; Advised patient, providing education and rationale, to monitor blood pressure daily and record, calling PCP for findings outside established parameters;  Discussed complications of poorly controlled blood pressure such as heart disease, stroke, circulatory complications, vision complications, kidney impairment, sexual dysfunction;  Assessed social determinant of health barriers;  Monitor for s/s of hypotension r/t blood pressure medication   Hyperlipidemia:  (Status: Condition stable. Not addressed this visit.) Long Term Goal  Lab Results  Component Value Date   CHOL 225 (H) 04/13/2020   HDL 51 04/13/2020   LDLCALC 121 (H) 04/13/2020   TRIG 302 (H) 04/13/2020   CHOLHDL 4.4 04/13/2020   Medication review performed;  medication list updated in electronic medical record.  Provider established cholesterol goals reviewed; Counseled on importance of regular laboratory monitoring as prescribed; Reviewed role and benefits of statin for ASCVD risk reduction; Reviewed importance of limiting  foods high in cholesterol; Reviewed exercise goals and target of 150 minutes per week; Recommended having lipid panel done at least once a year. Advised that it is overdue and was not checked at her last visit. Recommended that she have it at her next PCP visit.     GERD:  (Status: Goal on Track (progressing): YES.) Long Term Goal  Evaluation of treatment plan related to GERD and patient's adherence Reviewed and discussed medications and importance of compliance: Prevacid daily and prilosec twice daily Reviewed and discussed most recent GI visit and encouraged to f/u with GI if she continues to have GERD and dysphagia despite treatment with PPI and H2 blocker. Discussed diet and foods that make GERD worse Verbal education provided on ways to improve GERD symptoms   Constipation:  (Status: Condition stable. Not addressed this visit.) Short Term Goal  Discussed symptoms of constipation Has had a bowel movement several days in a row but had great difficulty passing stool today. Required a lot of straining and effort. Stool was somewhat soft, bulky, and formed. Had small amount of bright red blood. Suspects hemorrhoids. Reviewed and discussed medications and treatments Cautioned against routine use of stimulant laxatives and provided reasoning  Encouraged use of stool softeners and/or miralax Encouraged increased water intake Discussed role of fiber in constipation prevention and management Verbal education on proper diet provided. Fruits and vegetables encouraged. Whole grains can be helpful it taken with enough water.  Verbal and written information provided on proper body positioning for bowel elimination Anatomy of large intestine and rectum explained Verbal education provided on pelvic floor dysfunction and it's role in constipation and pelvic/abdominal discomfort Recommended to consider pelvic flood physical therapy. Written information provided as well.  Discussed that GI follow-up may  be needed if bleeding continues or constipation persists  Discussed most recent colonoscopies and although screening colonoscopies are necessary for her at this point, a diagnostic colonoscopy may be needed Advised to reach out to PCP with any new or worsening symptoms   Falls:  (Status: New goal.) Long Term Goal  Reviewed medications and discussed potential side effects of medications such as dizziness and frequent urination Advised patient of importance of notifying provider of falls Assessed for signs and symptoms of orthostatic hypotension Assessed for falls since last encounter Discussed mechanical fall earlier today. No head injury and no loss of consciousness. Denies headache or dizziness. She does have a busted lip but it is not gaping open. Doesn't feel sutures are needed. Encouraged to keep area clean and can apply skin protectant, like vaseline Encouraged to be aware of surroundings and to move carefully and change positions slowly to decrease risk for falls   Patient Goals/Self-Care Activities: Take medications as prescribed   Attend all scheduled provider appointments Perform all self care activities independently  Perform IADL's (shopping, preparing meals, housekeeping, managing finances) independently Call provider office for new concerns or questions  check blood pressure 3 times per week write blood pressure results in a log or diary take blood pressure log to all doctor appointments call doctor for signs and symptoms of high blood pressure - take all medications exactly as prescribed Check lipid panel at next PCP visit Limit foods that spicy or acid foods that cause GERD to worsen Call PCP or GI  with any new or worsening symptoms Talk with LCSW regarding stress/anxiety Call RN Care Manager as needed [phone] Use a stool to put your feet on when having a bowel movement Do not strain to have a bowel movement Increase water intake Increase intake of fruits and  vegetables Use miralax or stool softeners if needed Avoid stimulant laxatives Move carefully and change positions slowly to decrease risk of falls Keep lip laceration clean and apply skin protectant, like vaseline Seek medical attention for any new or worsening symptoms   Plan:Telephone follow up appointment with care management team member scheduled for:  09/09/21 with RNCM The patient has been provided with contact information for the care management team and has been advised to call with any health related questions or concerns.   Chong Sicilian, BSN, RN-BC Embedded Chronic Care Manager [facility] / [place] Management Direct Dial: [phone]

## 2021-09-09 NOTE — Patient Instructions (Signed)
Visit Information  Patient Goals/Self-Care Activities: Take medications as prescribed   Attend all scheduled provider appointments Perform all self care activities independently  Perform IADL's (shopping, preparing meals, housekeeping, managing finances) independently Call provider office for new concerns or questions  check blood pressure 3 times per week write blood pressure results in a log or diary take blood pressure log to all doctor appointments call doctor for signs and symptoms of high blood pressure - take all medications exactly as prescribed Check lipid panel at next PCP visit Limit foods that spicy or acid foods that cause GERD to worsen Call PCP or GI with any new or worsening symptoms Talk with LCSW regarding stress/anxiety Call RN Care Manager as needed [phone] Use a stool to put your feet on when having a bowel movement Do not strain to have a bowel movement Increase water intake Increase intake of fruits and vegetables Use miralax or stool softeners if needed Avoid stimulant laxatives Move carefully and change positions slowly to decrease risk of falls Keep lip laceration clean and apply skin protectant, like vaseline Seek medical attention for any new or worsening symptoms  Increase activity level as tolerated with a goal of 150 minutes a week Call PCP with any new or worsening symptoms  Patient verbalizes understanding of instructions and care plan provided today and agrees to view in [place]. Active MyChart status confirmed with patient.    Plan:Telephone follow up appointment with care management team member scheduled for:  09/09/2021 with RNCM The patient has been provided with contact information for the care management team and has been advised to call with any health related questions or concerns.   Chong Sicilian, BSN, RN-BC Embedded Chronic Care Manager [facility] / [place] Management Direct Dial: [phone]

## 2021-09-24 DIAGNOSIS — E039 Hypothyroidism, unspecified: Secondary | ICD-10-CM

## 2021-09-24 DIAGNOSIS — I10 Essential (primary) hypertension: Secondary | ICD-10-CM

## 2021-09-25 ENCOUNTER — Ambulatory Visit (INDEPENDENT_AMBULATORY_CARE_PROVIDER_SITE_OTHER): Payer: Medicare HMO

## 2021-09-25 DIAGNOSIS — E039 Hypothyroidism, unspecified: Secondary | ICD-10-CM

## 2021-09-25 DIAGNOSIS — F411 Generalized anxiety disorder: Secondary | ICD-10-CM

## 2021-09-25 DIAGNOSIS — I10 Essential (primary) hypertension: Secondary | ICD-10-CM

## 2021-09-25 DIAGNOSIS — N1832 Chronic kidney disease, stage 3b: Secondary | ICD-10-CM

## 2021-09-25 DIAGNOSIS — F320 Major depressive disorder, single episode, mild: Secondary | ICD-10-CM

## 2021-09-25 DIAGNOSIS — E785 Hyperlipidemia, unspecified: Secondary | ICD-10-CM

## 2021-09-25 DIAGNOSIS — K219 Gastro-esophageal reflux disease without esophagitis: Secondary | ICD-10-CM

## 2021-09-25 NOTE — Patient Instructions (Addendum)
Visit Information  Patient Goals:  Manage My Emotions; Manage Anxiety  and Stress issues  Timeframe:  Short-Term Goal Priority:  High Progress: On Track Start Date:     09/25/21          Expected End Date:    12/23/21                   Follow Up Date 11/19/21 at 1:00 PM    Manage My Emotions; Manage Anxiety and Stress issues    Why is this important?   When you are stressed, down or upset, your body reacts too.  For example, your blood pressure may get higher; you may have a headache or stomachache.  When your emotions get the best of you, your body's ability to fight off cold and flu gets weak.  These steps will help you manage your emotions.    Coping Skills of Patient: Has some family support from her spouse and from her son Has no driving issues Has food supply; has medications prescribed; has no walking issues  Patient Deficits:  Anxiety and stress issues Some irritability occasionally Sometimes forgets to take prescribed medications on schedule  Patient Goals: In next 30 days, patient will: Attend scheduled medical appointments Take medications as prescribed Communicate with spouse and son about her ongoing needs  Follow Up Plan:  LCSW to call client on 11/19/21 at 1:00 PM to assess client needs at that time.   Norva Riffle.Erie Radu MSW, [place] representative [facility] Care Management [phone]

## 2021-09-25 NOTE — Chronic Care Management (AMB) (Signed)
Chronic Care Management    Clinical Social Work Note  09/25/2021 Name: Susan Contreras MRN: [id number] DOB: 1941/01/18  Susan Contreras is a 81 y.o. year old female who is a primary care patient of Susan Balloon, FNP. The CCM team was consulted to assist the patient with chronic disease management and/or care coordination needs related to: [name] .   Engaged with patient by telephone for follow up visit in response to provider referral for social work chronic care management and care coordination services.   Consent to Services:  The patient was given information about Chronic Care Management services, agreed to services, and gave verbal consent prior to initiation of services.  Please see initial visit note for detailed documentation.   Patient agreed to services and consent obtained.   Assessment: Review of patient past medical history, allergies, medications, and health status, including review of relevant consultants reports was performed today as part of a comprehensive evaluation and provision of chronic care management and care coordination services.     SDOH (Social Determinants of Health) assessments and interventions performed:  SDOH Interventions    Flowsheet Row Most Recent Value  SDOH Interventions   Stress Interventions Provide Counseling  [client has stress related to health needs of her spouse. client has stress related to managing medical needs]  Depression Interventions/Treatment  Currently on Treatment        Advanced Directives Status: See Vynca application for related entries.  CCM Care Plan  Allergies  Allergen Reactions   Biaxin [Clarithromycin]    Cephalexin    Ciprofloxacin    Doxycycline     Headaches   Flagyl [Metronidazole Hcl]    Ketek [Telithromycin]    Sulfa Antibiotics     Outpatient Encounter Medications as of 09/25/2021  Medication Sig   ALPRAZolam (XANAX) 0.25 MG tablet TAKE 1 TABLET TWICE DAILY AS NEEDED FOR ANXIETY    atorvastatin (LIPITOR) 20 MG tablet Take 1 tablet (20 mg total) by mouth daily.   Chlorphen-PE-Acetaminophen (NOREL AD PO) Take 1 tablet by mouth as needed.    Cholecalciferol (VITAMIN D3) 125 MCG (5000 UT) CAPS Take 1 capsule (5,000 Units total) by mouth daily.   famotidine (PEPCID) 40 MG tablet Take 1 tablet (40 mg total) by mouth daily.   fluticasone (FLONASE) 50 MCG/ACT nasal spray Place 1 spray into both nostrils daily.   irbesartan (AVAPRO) 75 MG tablet TAKE ONE TABLET ONCE DAILY   levothyroxine (SYNTHROID) 50 MCG tablet Take 1 tablet (50 mcg total) by mouth daily.   meclizine (ANTIVERT) 25 MG tablet TAKE ONE TABLET BY MOUTH THREE TIMES DAILY AS NEEDED FOR DIZZINESS   pantoprazole (PROTONIX) 20 MG tablet Take 1 tablet (20 mg total) by mouth 2 (two) times daily before a meal.   traZODone (DESYREL) 50 MG tablet Take 1 tablet (50 mg total) by mouth at bedtime.   No facility-administered encounter medications on file as of 09/25/2021.    Patient Active Problem List   Diagnosis Date Noted   Chronic kidney disease, stage 3 ([place]) 08/30/2021   Insomnia 05/07/2021   History of colonic polyps 04/26/2020   Benzodiazepine dependence ([place]) 05/19/2018   Controlled substance agreement signed 05/19/2018   Obesity (BMI 30.0-34.9) 73/42/8768   Metabolic syndrome 11/57/2620   GAD (generalized anxiety disorder) 08/14/2015   Allergic rhinitis 08/14/2015   GERD (gastroesophageal reflux disease) 08/14/2015   Hyperlipidemia 08/14/2015   Vitamin D deficiency 08/14/2015   Hypothyroidism 05/29/2014   Cholelithiasis 01/15/2011   Family history of colon cancer  01/15/2011   Hypertension 11/11/2007   ESOPHAGEAL STRICTURE 11/11/2007   Diaphragmatic hernia 11/11/2007   DIVERTICULOSIS OF COLON 11/11/2007    Conditions to be addressed/monitored: monitor client management of anxiety and stress issues  Care Plan : General Social Work (Adult)  Updates made by Katha Cabal, LCSW since 09/25/2021 12:00 AM      Problem: Emotional Distress      Goal: Client needs help in managing anxiety and stress issues faced   Start Date: 09/25/2021  Expected End Date: 12/23/2021  This Visit's Progress: On track  Recent Progress: Not on track  Priority: High  Note:   Current Barriers:  Chronic Mental Health needs related to anxiety and stress issues faced Limited social support   Suicidal Ideation/Homicidal Ideation: No Low energy, fatigued occasionally  Clinical Social Work Goal(s):  patient will work with SW monthly by telephone or in person to reduce or manage symptoms related to anxiety or stress issues faced patient will work with SW monthly to address concerns related to ongoing in home care needs of her spouse Patient will call RNCM as needed in next 30 days to discuss nursing needs of client Patient will attend scheduled medical appointments in next 30 days  Interventions: 1:1 collaboration with Susan Balloon, FNP regarding development and update of comprehensive plan of care as evidenced by provider attestation and co-signature  Reviewed with client the ongoing care needs of her spouse Reviewed with client the transport needs of client Discussed with client the sleeping issues of client.   Discussed with client the mood status of client.  She said she does get a little sad occasionally. She said she does get anxious sometimes in managing her health needs. She gets anxious sometimes thinking about care needs of her spouse. She spoke of taking Xanax; but she said she does not take Xanax every day.   Discussed support from [facility] for nursing needs of client. Encouraged Susan Contreras to call RNCM as needed for nursing support. Discussed with client medication procurement of client Provided counseling support for client Encouraged Susan Contreras to call LCSW as needed in next 30 days to discuss social work needs of client Reviewed energy level of client. She said she does get fatigued more often Client said she  would like to talk with RNCM or NP about Kidney status of client. Discussed client fall in January of 2022. She said she is careful when walking  Patient Self Care Activities:  Self administers medications as prescribed Attends all scheduled provider appointments Calls provider office for new concerns or questions  Patient Coping Strengths:  Supportive Relationships Family Friends  Patient Self Care Deficits:  Anxiety or stress issues related to managing ongoing chronic care needs of her spouse  Patient Goals:  - spend time or talk with others every day - practice relaxation or meditation daily - keep a calendar with appointment dates  Follow Up Plan: LCSW to call client on  11/19/21 at 1:00 PM to assess client needs     Norva Riffle.Sarahy Creedon MSW, [place] [employer] representative [facility] Care Management [phone]

## 2021-10-10 ENCOUNTER — Telehealth: Payer: Medicare HMO | Admitting: *Deleted

## 2021-10-15 ENCOUNTER — Encounter: Payer: Self-pay | Admitting: *Deleted

## 2021-10-15 ENCOUNTER — Ambulatory Visit: Payer: Medicare HMO | Admitting: *Deleted

## 2021-10-15 DIAGNOSIS — F411 Generalized anxiety disorder: Secondary | ICD-10-CM

## 2021-10-15 DIAGNOSIS — N1832 Chronic kidney disease, stage 3b: Secondary | ICD-10-CM

## 2021-10-15 DIAGNOSIS — F320 Major depressive disorder, single episode, mild: Secondary | ICD-10-CM

## 2021-10-15 DIAGNOSIS — I10 Essential (primary) hypertension: Secondary | ICD-10-CM

## 2021-10-15 NOTE — Patient Instructions (Signed)
Visit Information  Patient Goals/Self-Care Activities: Take medications as prescribed   Attend all scheduled provider appointments Perform all self care activities independently  Perform IADL's (shopping, preparing meals, housekeeping, managing finances) independently Call provider office for new concerns or questions  check blood pressure 3 times per week write blood pressure results in a log or diary take blood pressure log to all doctor appointments call doctor for signs and symptoms of high blood pressure - take all medications exactly as prescribed Check lipid panel at next PCP visit Limit foods that spicy or acid foods that cause GERD to worsen Call PCP or GI with any new or worsening symptoms Talk with LCSW regarding stress/anxiety Call RN Care Manager as needed [phone] Use a stool to put your feet on when having a bowel movement Do not strain to have a bowel movement Increase water intake Increase intake of fruits and vegetables Use miralax or stool softeners if needed Avoid stimulant laxatives Move carefully and change positions slowly to decrease risk of falls Increase activity level as tolerated with a goal of 150 minutes a week Take Vit D and can try a B complex vitamin Talk with PCP re: CKD diagnosis Talk with LCSW regarding anxiety and psychosocial concerns Decrease amount of news watched each day Seek medical attention for any new or worsening sinusitis/cold symptoms  Patient verbalizes understanding of instructions and care plan provided today and agrees to view in [place]. Active MyChart status confirmed with patient.    Plan:Telephone follow up appointment with care management team member scheduled for:  10/25/21 with RNCM The patient has been provided with contact information for the care management team and has been advised to call with any health related questions or concerns.   Chong Sicilian, BSN, RN-BC Embedded Chronic Care Manager [facility] / [place] [facility] Direct Dial: [phone]

## 2021-10-15 NOTE — Chronic Care Management (AMB) (Signed)
Chronic Care Management   CCM RN Visit Note  10/15/2021 Name: Susan Contreras MRN: [id number] DOB: 1941/03/23  Subjective: Susan Contreras is a 81 y.o. year old female who is a primary care patient of [name], FNP. The care management team was consulted for assistance with disease management and care coordination needs.    Engaged with patient by telephone for  care coordination  in response to provider referral for case management and/or care coordination services.   Consent to Services:  The patient was given information about Chronic Care Management services, agreed to services, and gave verbal consent prior to initiation of services.  Please see initial visit note for detailed documentation.   Patient agreed to services and verbal consent obtained.   Assessment: Review of patient past medical history, allergies, medications, health status, including review of consultants reports, laboratory and other test data, was performed as part of comprehensive evaluation and provision of chronic care management services.   SDOH (Social Determinants of Health) assessments and interventions performed:    CCM Care Plan  Allergies  Allergen Reactions   Biaxin [Clarithromycin]    Cephalexin    Ciprofloxacin    Doxycycline     Headaches   Flagyl [Metronidazole Hcl]    Ketek [Telithromycin]    Sulfa Antibiotics     Outpatient Encounter Medications as of 10/15/2021  Medication Sig   ALPRAZolam (XANAX) 0.25 MG tablet TAKE 1 TABLET TWICE DAILY AS NEEDED FOR ANXIETY   atorvastatin (LIPITOR) 20 MG tablet Take 1 tablet (20 mg total) by mouth daily.   Chlorphen-PE-Acetaminophen (NOREL AD PO) Take 1 tablet by mouth as needed.    Cholecalciferol (VITAMIN D3) 125 MCG (5000 UT) CAPS Take 1 capsule (5,000 Units total) by mouth daily.   famotidine (PEPCID) 40 MG tablet Take 1 tablet (40 mg total) by mouth daily.   fluticasone (FLONASE) 50 MCG/ACT nasal spray Place 1 spray into both nostrils daily.    irbesartan (AVAPRO) 75 MG tablet TAKE ONE TABLET ONCE DAILY   levothyroxine (SYNTHROID) 50 MCG tablet Take 1 tablet (50 mcg total) by mouth daily.   meclizine (ANTIVERT) 25 MG tablet TAKE ONE TABLET BY MOUTH THREE TIMES DAILY AS NEEDED FOR DIZZINESS   pantoprazole (PROTONIX) 20 MG tablet Take 1 tablet (20 mg total) by mouth 2 (two) times daily before a meal.   traZODone (DESYREL) 50 MG tablet Take 1 tablet (50 mg total) by mouth at bedtime.   No facility-administered encounter medications on file as of 10/15/2021.    Patient Active Problem List   Diagnosis Date Noted   Chronic kidney disease, stage 3 ([place]) 08/30/2021   Insomnia 05/07/2021   History of colonic polyps 04/26/2020   Benzodiazepine dependence ([place]) 05/19/2018   Controlled substance agreement signed 05/19/2018   Obesity (BMI 30.0-34.9) 56/38/9373   Metabolic syndrome 42/87/6811   GAD (generalized anxiety disorder) 08/14/2015   Allergic rhinitis 08/14/2015   GERD (gastroesophageal reflux disease) 08/14/2015   Hyperlipidemia 08/14/2015   Vitamin D deficiency 08/14/2015   Hypothyroidism 05/29/2014   Cholelithiasis 01/15/2011   Family history of colon cancer 01/15/2011   Hypertension 11/11/2007   ESOPHAGEAL STRICTURE 11/11/2007   Diaphragmatic hernia 11/11/2007   DIVERTICULOSIS OF COLON 11/11/2007    Conditions to be addressed/monitored:HTN, CKD Stage 3, Anxiety, and Depression  Care Plan : [facility] Care Plan  Updates made by Ilean China, RN since 10/15/2021 12:00 AM     Problem: Chronic Disease Management Needs   Priority: High  Onset Date: 07/31/2021  Long-Range Goal: Work with Consulting civil engineer Regarding Care Coordination and Care Management Associated with HTN, GAD, GERD, HLD, Hypothyroidism,Depression, CKD   Start Date: 07/31/2021  Expected End Date: 07/31/2022  This Visit's Progress: On track  Recent Progress: On track  Priority: High  Note:   Current Barriers:  Chronic Disease Management support and  education needs related to HTN, HLD, and GERD, hypothyroidism, depression, CKD, anxiety Caregiver strain  RNCM Clinical Goal(s):  Patient will continue to work with Consulting civil engineer and/or Social Worker to address care management and care coordination needs related to HTN, HLD, and GERD, hypothyroidism, depression, and anxiety as evidenced by adherence to CM Team Scheduled appointments     through collaboration with RN Care manager, provider, and care team.  Patient will demonstrate improved self health maintenance by keeping all medical appointments  Interventions: 1:1 collaboration with primary care provider regarding development and update of comprehensive plan of care as evidenced by provider attestation and co-signature Inter-disciplinary care team collaboration (see longitudinal plan of care) Evaluation of current treatment plan related to  self management and patient's adherence to plan as established by provider   Health Maintenance (Status: Condition stable. Not addressed this visit.)  Short Term Goal  Patient interviewed about adult health maintenance status including Colonoscopy, pneumonia Vaccine, Yearly Influenza vaccine, COVID vaccinations, Mammogram   Advised patient to discuss Pneumonia Vaccine with primary care provider. Recommended Pneumovax after 08/07/21 Documented Historical Immunization accuracy confirmed  Patient provided CDC guideline information about Pneumonia vaccine Previously assisted in scheduling screening mammogram at [facility] on 08/07/21 at 11:30 and sent patient a My Chart Message with a copy of the appointment confirmation and advised to reach out to [facility] directly if she needs to reschedule Patient cancelled appointment Collaborated with [facility] staff to schedule patient on the mobile mammography unit at [facility] for 09/23/21   Hypothyroidism:  (Status: Condition stable. Not addressed this visit.) Long Term Goal  Lab Results  Component Value Date   TSH 3.080  07/10/2021  Reviewed and discussed medications and importance of compliance Reviewed and discussed most recent lab results and normal TSH Encouraged patient to f/u with PCP and endocrinologist as recommended and to have TSH rechecked as instructed Discussed low energy and fatigue. Encouraged to increase activity level as tolerated   Hypertension: (Status: Condition stable. Not addressed this visit.) Last practice recorded BP readings:  BP Readings from Last 3 Encounters:  07/10/21 (!) 143/78  01/23/21 117/67  08/13/20 123/72   Most recent eGFR/CrCl:  Lab Results  Component Value Date   EGFR 40 (L) 07/10/2021    No components found for: CRCL  Evaluation of current treatment plan related to hypertension self management and patient's adherence to plan as established by provider;   Reviewed medications with patient and discussed importance of compliance;  Counseled on the importance of exercise goals with target of 150 minutes per week Discussed plans with patient for ongoing care management follow up and provided patient with direct contact information for care management team; Advised patient, providing education and rationale, to monitor blood pressure daily and record, calling PCP for findings outside established parameters;  Discussed complications of poorly controlled blood pressure such as heart disease, stroke, circulatory complications, vision complications, kidney impairment, sexual dysfunction;  Assessed social determinant of health barriers;  Monitor for s/s of hypotension r/t blood pressure medication   Hyperlipidemia:  (Status: Condition stable. Not addressed this visit.) Long Term Goal  Lab Results  Component Value Date   CHOL 225 (H)  04/13/2020   HDL 51 04/13/2020   LDLCALC 121 (H) 04/13/2020   TRIG 302 (H) 04/13/2020   CHOLHDL 4.4 04/13/2020   Medication review performed; medication list updated in electronic medical record.  Provider established cholesterol goals  reviewed; Counseled on importance of regular laboratory monitoring as prescribed; Reviewed role and benefits of statin for ASCVD risk reduction; Reviewed importance of limiting foods high in cholesterol; Reviewed exercise goals and target of 150 minutes per week; Recommended having lipid panel done at least once a year. Advised that it is overdue and was not checked at her last visit. Recommended that she have it at her next PCP visit.     GERD:  (Status: Condition stable. Not addressed this visit.) Long Term Goal  Evaluation of treatment plan related to GERD and patient's adherence Reviewed and discussed medications and importance of compliance: Prevacid daily and prilosec twice daily Reviewed and discussed most recent GI visit and encouraged to f/u with GI if she continues to have GERD and dysphagia despite treatment with PPI and H2 blocker. Discussed diet and foods that make GERD worse Verbal education provided on ways to improve GERD symptoms   Constipation:  (Status: Condition stable. Not addressed this visit.) Short Term Goal  Discussed symptoms of constipation Has had a bowel movement several days in a row but had great difficulty passing stool today. Required a lot of straining and effort. Stool was somewhat soft, bulky, and formed. Had small amount of bright red blood. Suspects hemorrhoids. Reviewed and discussed medications and treatments Cautioned against routine use of stimulant laxatives and provided reasoning  Encouraged use of stool softeners and/or miralax Encouraged increased water intake Discussed role of fiber in constipation prevention and management Verbal education on proper diet provided. Fruits and vegetables encouraged. Whole grains can be helpful it taken with enough water.  Verbal and written information provided on proper body positioning for bowel elimination Anatomy of large intestine and rectum explained Verbal education provided on pelvic floor dysfunction  and it's role in constipation and pelvic/abdominal discomfort Recommended to consider pelvic flood physical therapy. Written information provided as well.  Discussed that GI follow-up may be needed if bleeding continues or constipation persists  Discussed most recent colonoscopies and although screening colonoscopies are necessary for her at this point, a diagnostic colonoscopy may be needed Advised to reach out to PCP with any new or worsening symptoms   Falls:  (Status: Condition stable. Not addressed this visit.) Long Term Goal  Reviewed medications and discussed potential side effects of medications such as dizziness and frequent urination Advised patient of importance of notifying provider of falls Assessed for signs and symptoms of orthostatic hypotension Assessed for falls since last encounter Discussed mechanical fall 2 weeks ago on her deck where she tripped over a nail. No head injury and no loss of consciousness. Denies headache or dizziness. Her lip is healing. No additional complaints related to fall. Encouraged to be aware of surroundings and to move carefully and change positions slowly to decrease risk for falls   Chronic Kidney Disease (Status: Condition stable. Not addressed this visit.)  Long Term Goal  Last practice recorded BP readings:  BP Readings from Last 3 Encounters:  07/10/21 (!) 143/78  01/23/21 117/67  08/13/20 123/72  Most recent eGFR/CrCl:  Lab Results  Component Value Date   EGFR 40 (L) 07/10/2021    No components found for: CRCL  Lab Results  Component Value Date   CREATININE 1.37 (H) 07/10/2021   BUN 10  07/10/2021   NA 139 07/10/2021   K 4.4 07/10/2021   CL 99 07/10/2021   CO2 20 07/10/2021   Assessed the patient     understanding of chronic kidney disease    Evaluation of current treatment plan related to chronic kidney disease self management and patient's adherence to plan as established by provider      Reviewed medications with patient  and discussed importance of compliance    Counseled on the importance of exercise goals with target of 150 minutes per week     Collaborated with PCP to add CKD stage 3 to problem list based on elevated creatinine and decreased GFR Support coping and stress management by recognizing current strategies and assist in developing new strategies such as mindfulness, journaling, relaxation techniques, problem-solving     Discussed that fatigue can be a symptom of CKD Encouraged patient to increase activity level as tolerated and to rest as needed Reviewed and discussed recent lab results, including CBC. Patient does not appear to have anemia related to CKD Reviewed upcoming appointments Encouraged patient to reach out to PCP with any new or worsening symptoms Therapeutic listening utilized regarding health related anxiety and diagnosis of CKD Discussed OTC vitamins. Patient has restarted vit D and does feel that it's helping her energy level some Questions answered regarding potential b12 deficiency. Reviewed available labs and advised that mots recent b12 levels were normal Recommended a B Complex vitamin if she would like to try it. May help with energy. Specific B12 supplement or injections aren't needed since levels are normal and wouldn't likely be effective   Anxiety:  (Status: Goal on Track (progressing): YES.) Long Term Goal  Therapeutic listening utilized regarding personal and family health related anxiety as well as generalized anxiety related to news reports and current affairs Encouraged to talk with LCSW and reviewed upcoming appointment Previously recommended that she decrease the amount of news reports that she watches each day and explained that those can increase anxiety in a person that is prone to excessive worrying Discussed potential covid symptoms. Sister is hospitalized with covid now. Patient has at-home-tests but hasn't used any. She has taken some amoxicillin that she had left  over from a prior infection. Education provided on why you shouldn't have leftover antibiotics and why you shouldn't start taking an incomplete antibiotic prescription Discussed symptoms of nasal congestion, sore throat, and post nasal drainage and use of Norel AD.  Collaborated with [facility] front office staff to schedule patient an appointment for an acute visit tomorrow Encouraged to seek medical attention for any new or worsening symptoms   Patient Goals/Self-Care Activities: Take medications as prescribed   Attend all scheduled provider appointments Perform all self care activities independently  Perform IADL's (shopping, preparing meals, housekeeping, managing finances) independently Call provider office for new concerns or questions  check blood pressure 3 times per week write blood pressure results in a log or diary take blood pressure log to all doctor appointments call doctor for signs and symptoms of high blood pressure - take all medications exactly as prescribed Check lipid panel at next PCP visit Limit foods that spicy or acid foods that cause GERD to worsen Call PCP or GI with any new or worsening symptoms Talk with LCSW regarding stress/anxiety Call RN Care Manager as needed [phone] Use a stool to put your feet on when having a bowel movement Do not strain to have a bowel movement Increase water intake Increase intake of fruits and vegetables Use miralax  or stool softeners if needed Avoid stimulant laxatives Move carefully and change positions slowly to decrease risk of falls Increase activity level as tolerated with a goal of 150 minutes a week Take Vit D and can try a B complex vitamin Talk with PCP re: CKD diagnosis Talk with LCSW regarding anxiety and psychosocial concerns Decrease amount of news watched each day Seek medical attention for any new or worsening sinusitis/cold symptoms  Plan:Telephone follow up appointment with care management team member  scheduled for:  10/25/21 with RNCM The patient has been provided with contact information for the care management team and has been advised to call with any health related questions or concerns.   Chong Sicilian, BSN, RN-BC Embedded Chronic Care Manager [facility] [place] [facility] Direct Dial: [phone]

## 2021-10-16 ENCOUNTER — Encounter: Payer: Self-pay | Admitting: Family Medicine

## 2021-10-16 ENCOUNTER — Ambulatory Visit (INDEPENDENT_AMBULATORY_CARE_PROVIDER_SITE_OTHER): Payer: Medicare HMO | Admitting: Family Medicine

## 2021-10-16 DIAGNOSIS — J019 Acute sinusitis, unspecified: Secondary | ICD-10-CM | POA: Diagnosis not present

## 2021-10-16 MED ORDER — AMOXICILLIN 500 MG PO CAPS: 500.0000 mg | ORAL_CAPSULE | Freq: Two times a day (BID) | ORAL | 0 refills | Status: AC

## 2021-10-16 NOTE — Progress Notes (Signed)
Virtual Visit via telephone Note  I connected with Susan Contreras on 10/16/21 at 1742 by telephone and verified that I am speaking with the correct person using two identifiers. Susan Contreras is currently located at home and patient are currently with her during visit. The provider, Fransisca Kaufmann Jerni Selmer, MD is located in their office at time of visit.  Call ended at 1749  I discussed the limitations, risks, security and privacy concerns of performing an evaluation and management service by telephone and the availability of in person appointments. I also discussed with the patient that there may be a patient responsible charge related to this service. The patient expressed understanding and agreed to proceed.   History and Present Illness: Patient is calling in for sinus congestion and weak and allergies and drainage in her throat.  She feels like her drainage in the back of her throat.  She took some extra amoxicillin and she is getting better.  She denies fevers or chills.  She took 3 amoxicillin and is feeling better.  She uses flonase and norel ad and is helping some. She denies shortness of breath or wheezing.   1. Acute sinusitis, recurrence not specified, unspecified location     Outpatient Encounter Medications as of 10/16/2021  Medication Sig   amoxicillin (AMOXIL) 500 MG capsule Take 1 capsule (500 mg total) by mouth 2 (two) times daily for 10 days.   ALPRAZolam (XANAX) 0.25 MG tablet TAKE 1 TABLET TWICE DAILY AS NEEDED FOR ANXIETY   atorvastatin (LIPITOR) 20 MG tablet Take 1 tablet (20 mg total) by mouth daily.   Chlorphen-PE-Acetaminophen (NOREL AD PO) Take 1 tablet by mouth as needed.    Cholecalciferol (VITAMIN D3) 125 MCG (5000 UT) CAPS Take 1 capsule (5,000 Units total) by mouth daily.   famotidine (PEPCID) 40 MG tablet Take 1 tablet (40 mg total) by mouth daily.   fluticasone (FLONASE) 50 MCG/ACT nasal spray Place 1 spray into both nostrils daily.   irbesartan (AVAPRO) 75 MG  tablet TAKE ONE TABLET ONCE DAILY   levothyroxine (SYNTHROID) 50 MCG tablet Take 1 tablet (50 mcg total) by mouth daily.   meclizine (ANTIVERT) 25 MG tablet TAKE ONE TABLET BY MOUTH THREE TIMES DAILY AS NEEDED FOR DIZZINESS   pantoprazole (PROTONIX) 20 MG tablet Take 1 tablet (20 mg total) by mouth 2 (two) times daily before a meal.   traZODone (DESYREL) 50 MG tablet Take 1 tablet (50 mg total) by mouth at bedtime.   No facility-administered encounter medications on file as of 10/16/2021.    Review of Systems  Constitutional:  Negative for chills and fever.  HENT:  Positive for congestion, postnasal drip, rhinorrhea, sinus pressure and sneezing. Negative for ear discharge, ear pain and sore throat.   Eyes:  Negative for pain, redness and visual disturbance.  Respiratory:  Negative for cough, chest tightness and shortness of breath.   Cardiovascular:  Negative for chest pain and leg swelling.  Genitourinary:  Negative for difficulty urinating and dysuria.  Musculoskeletal:  Negative for back pain and gait problem.  Skin:  Negative for rash.  Neurological:  Negative for light-headedness and headaches.  Psychiatric/Behavioral:  Negative for agitation and behavioral problems.   All other systems reviewed and are negative.  Observations/Objective: Patient sounds comfortable and in no acute distress  Assessment and Plan: Problem List Items Addressed This Visit   None Visit Diagnoses     Acute sinusitis, recurrence not specified, unspecified location    -  Primary  Relevant Medications   amoxicillin (AMOXIL) 500 MG capsule      Already started, will continue to prevent resistance.   Follow up plan: Return if symptoms worsen or fail to improve.     I discussed the assessment and treatment plan with the patient. The patient was provided an opportunity to ask questions and all were answered. The patient agreed with the plan and demonstrated an understanding of the instructions.    The patient was advised to call back or seek an in-person evaluation if the symptoms worsen or if the condition fails to improve as anticipated.  The above assessment and management plan was discussed with the patient. The patient verbalized understanding of and has agreed to the management plan. Patient is aware to call the clinic if symptoms persist or worsen. Patient is aware when to return to the clinic for a follow-up visit. Patient educated on when it is appropriate to go to the emergency department.    I provided 7 minutes of non-face-to-face time during this encounter.    Worthy Rancher, MD

## 2021-10-22 DIAGNOSIS — I10 Essential (primary) hypertension: Secondary | ICD-10-CM | POA: Diagnosis not present

## 2021-10-22 DIAGNOSIS — F320 Major depressive disorder, single episode, mild: Secondary | ICD-10-CM | POA: Diagnosis not present

## 2021-10-22 DIAGNOSIS — E785 Hyperlipidemia, unspecified: Secondary | ICD-10-CM

## 2021-10-22 DIAGNOSIS — E039 Hypothyroidism, unspecified: Secondary | ICD-10-CM

## 2021-10-25 ENCOUNTER — Ambulatory Visit (INDEPENDENT_AMBULATORY_CARE_PROVIDER_SITE_OTHER): Payer: Medicare HMO | Admitting: *Deleted

## 2021-10-25 DIAGNOSIS — E039 Hypothyroidism, unspecified: Secondary | ICD-10-CM

## 2021-10-25 DIAGNOSIS — I10 Essential (primary) hypertension: Secondary | ICD-10-CM

## 2021-10-25 DIAGNOSIS — F411 Generalized anxiety disorder: Secondary | ICD-10-CM

## 2021-10-29 ENCOUNTER — Encounter: Payer: Self-pay | Admitting: Family

## 2021-10-29 ENCOUNTER — Ambulatory Visit (INDEPENDENT_AMBULATORY_CARE_PROVIDER_SITE_OTHER): Payer: Medicare HMO

## 2021-10-29 ENCOUNTER — Ambulatory Visit (INDEPENDENT_AMBULATORY_CARE_PROVIDER_SITE_OTHER): Payer: Medicare HMO | Admitting: Family

## 2021-10-29 VITALS — BP 127/67 | HR 78 | Temp 97.2°F | Ht 61.0 in | Wt 181.2 lb

## 2021-10-29 DIAGNOSIS — E559 Vitamin D deficiency, unspecified: Secondary | ICD-10-CM

## 2021-10-29 DIAGNOSIS — E66811 Obesity, class 1: Secondary | ICD-10-CM

## 2021-10-29 DIAGNOSIS — Z23 Encounter for immunization: Secondary | ICD-10-CM

## 2021-10-29 DIAGNOSIS — K219 Gastro-esophageal reflux disease without esophagitis: Secondary | ICD-10-CM

## 2021-10-29 DIAGNOSIS — Z780 Asymptomatic menopausal state: Secondary | ICD-10-CM

## 2021-10-29 DIAGNOSIS — E039 Hypothyroidism, unspecified: Secondary | ICD-10-CM

## 2021-10-29 DIAGNOSIS — Z79899 Other long term (current) drug therapy: Secondary | ICD-10-CM

## 2021-10-29 DIAGNOSIS — E669 Obesity, unspecified: Secondary | ICD-10-CM

## 2021-10-29 DIAGNOSIS — N1832 Chronic kidney disease, stage 3b: Secondary | ICD-10-CM | POA: Diagnosis not present

## 2021-10-29 DIAGNOSIS — I159 Secondary hypertension, unspecified: Secondary | ICD-10-CM | POA: Diagnosis not present

## 2021-10-29 DIAGNOSIS — E785 Hyperlipidemia, unspecified: Secondary | ICD-10-CM

## 2021-10-29 DIAGNOSIS — F411 Generalized anxiety disorder: Secondary | ICD-10-CM

## 2021-10-29 DIAGNOSIS — F1320 Sedative, hypnotic or anxiolytic dependence, uncomplicated: Secondary | ICD-10-CM | POA: Diagnosis not present

## 2021-10-29 DIAGNOSIS — G4700 Insomnia, unspecified: Secondary | ICD-10-CM

## 2021-10-29 MED ORDER — ALPRAZOLAM 0.25 MG PO TABS: ORAL_TABLET | ORAL | 2 refills | Status: DC

## 2021-10-29 NOTE — Progress Notes (Signed)
? ?Subjective:  ? ? Patient ID: Susan Contreras, female    DOB: [birth year], 81 y.o.   MRN: [id number] ? ?Chief Complaint  ?Patient presents with  ? Medical Management of Chronic Issues  ? ?Pt calls the office today for chronic follow up. She is followed by GI as needed. She has CKD and avoid NSAID's.  ?Hypertension ?This is a chronic problem. The current episode started more than 1 year ago. The problem has been resolved since onset. The problem is controlled. Associated symptoms include anxiety and malaise/fatigue. Pertinent negatives include no peripheral edema or shortness of breath. Risk factors for coronary artery disease include obesity. The current treatment provides moderate improvement. Identifiable causes of hypertension include a thyroid problem.  ?Gastroesophageal Reflux ?She complains of belching and heartburn. This is a chronic problem. The current episode started more than 1 year ago. The problem occurs occasionally. The problem has been waxing and waning. Associated symptoms include fatigue. Risk factors include obesity. She has tried a PPI for the symptoms. The treatment provided moderate relief.  ?Thyroid Problem ?Presents for follow-up visit. Symptoms include anxiety, depressed mood, dry skin and fatigue. The symptoms have been stable. Her past medical history is significant for hyperlipidemia.  ?Insomnia ?Primary symptoms: difficulty falling asleep, frequent awakening, malaise/fatigue.   ?The current episode started more than one year. The onset quality is gradual. The problem occurs intermittently. The treatment provided moderate relief.  ?Hyperlipidemia ?This is a chronic problem. The current episode started more than 1 year ago. The problem is controlled. Recent lipid tests were reviewed and are normal. Pertinent negatives include no shortness of breath. Current antihyperlipidemic treatment includes statins. The current treatment provides moderate improvement of lipids. Risk factors for coronary  artery disease include dyslipidemia, diabetes mellitus, hypertension, a sedentary lifestyle and post-menopausal.  ?Anxiety ?Presents for follow-up visit. Symptoms include depressed mood, excessive worry, insomnia, irritability, nervous/anxious behavior and restlessness. Patient reports no shortness of breath. Symptoms occur occasionally. The severity of symptoms is moderate.  ? ? ? ? ? ?Review of Systems  ?Constitutional:  Positive for fatigue, irritability and malaise/fatigue.  ?Respiratory:  Negative for shortness of breath.   ?Gastrointestinal:  Positive for heartburn.  ?Psychiatric/Behavioral:  The patient is nervous/anxious and has insomnia.   ?All other systems reviewed and are negative. ? ?   ?Objective:  ? Physical Exam ?Vitals reviewed.  ?Constitutional:   ?   General: She is not in acute distress. ?   Appearance: She is well-developed. She is obese.  ?HENT:  ?   Head: Normocephalic and atraumatic.  ?   Right Ear: Tympanic membrane normal.  ?   Left Ear: Tympanic membrane normal.  ?Eyes:  ?   Pupils: Pupils are equal, round, and reactive to light.  ?Neck:  ?   Thyroid: No thyromegaly.  ?Cardiovascular:  ?   Rate and Rhythm: Normal rate and regular rhythm.  ?   Heart sounds: Normal heart sounds. No murmur heard. ?Pulmonary:  ?   Effort: Pulmonary effort is normal. No respiratory distress.  ?   Breath sounds: Normal breath sounds. No wheezing.  ?Abdominal:  ?   General: Bowel sounds are normal. There is no distension.  ?   Palpations: Abdomen is soft.  ?   Tenderness: There is no abdominal tenderness.  ?Musculoskeletal:     ?   General: No tenderness. Normal range of motion.  ?   Cervical back: Normal range of motion and neck supple.  ?Skin: ?   General: Skin is warm  and dry.  ?Neurological:  ?   Mental Status: She is alert and oriented to person, place, and time.  ?   Cranial Nerves: No cranial nerve deficit.  ?   Deep Tendon Reflexes: Reflexes are normal and symmetric.  ?Psychiatric:     ?   Behavior:  Behavior normal.     ?   Thought Content: Thought content normal.     ?   Judgment: Judgment normal.  ? ? ? ? ?BP 127/67   Pulse 78   Temp (!) 97.2 ?F (36.2 ?C) (Temporal)   Ht _0  (1.549 m)   Wt 181 lb 3.2 oz (82.2 kg)   SpO2 95%   BMI 34.24 kg/m?  ? ?   ?Assessment & Plan:  ?Susan Contreras comes in today with chief complaint of Medical Management of Chronic Issues ? ? ?Diagnosis and orders addressed: ? ?1. Controlled substance agreement signed ?- ALPRAZolam (XANAX) 0.25 MG tablet; TAKE 1 TABLET TWICE DAILY AS NEEDED FOR ANXIETY  Dispense: 60 tablet; Refill: 2 ?- CMP14+EGFR ?- CBC with Differential/Platelet ? ?2. Benzodiazepine dependence ([place]) ?- ALPRAZolam (XANAX) 0.25 MG tablet; TAKE 1 TABLET TWICE DAILY AS NEEDED FOR ANXIETY  Dispense: 60 tablet; Refill: 2 ?- CMP14+EGFR ?- CBC with Differential/Platelet ? ?3. GAD (generalized anxiety disorder) ?- ALPRAZolam (XANAX) 0.25 MG tablet; TAKE 1 TABLET TWICE DAILY AS NEEDED FOR ANXIETY  Dispense: 60 tablet; Refill: 2 ?- CMP14+EGFR ?- CBC with Differential/Platelet ? ?4. Secondary hypertension ?- CMP14+EGFR ?- CBC with Differential/Platelet ? ?5. Gastroesophageal reflux disease, unspecified whether esophagitis present ?- CMP14+EGFR ?- CBC with Differential/Platelet ? ?6. Hypothyroidism, unspecified type ?- CMP14+EGFR ?- CBC with Differential/Platelet ?- TSH ? ?7. Stage 3b chronic kidney disease ([place]) ?- CMP14+EGFR ?- CBC with Differential/Platelet ? ?8. Obesity (BMI 30.0-34.9) ?- CMP14+EGFR ?- CBC with Differential/Platelet ? ?9. Vitamin D deficiency ?- CMP14+EGFR ?- CBC with Differential/Platelet ? ?10. Insomnia, unspecified type ?- CMP14+EGFR ?- CBC with Differential/Platelet ? ?11. Hyperlipidemia, unspecified hyperlipidemia type ?- CMP14+EGFR ?- CBC with Differential/Platelet ? ?12. Post-menopausal ?- DG [facility] DEXA ? ? ?Labs pending ?Patient reviewed in [place] controlled database, no flags noted. Contract and drug screen are up to date on 01/25/21 ?Health  Maintenance reviewed ?Diet and exercise encouraged ? ?Follow up plan: ?3 months  ? ? ?[name], FNP ? ? ?

## 2021-10-29 NOTE — Patient Instructions (Signed)
Gastroesophageal Reflux Disease, Adult ?Gastroesophageal reflux (GER) happens when acid from the stomach flows up into the tube that connects the mouth and the stomach (esophagus). Normally, food travels down the esophagus and stays in the stomach to be digested. However, when a person has GER, food and stomach acid sometimes move back up into the esophagus. If this becomes a more serious problem, the person may be diagnosed with a disease called gastroesophageal reflux disease (GERD). GERD occurs when the reflux: ?Happens often. ?Causes frequent or severe symptoms. ?Causes problems such as damage to the esophagus. ?When stomach acid comes in contact with the esophagus, the acid may cause inflammation in the esophagus. Over time, GERD may create small holes (ulcers) in the lining of the esophagus. ?What are the causes? ?This condition is caused by a problem with the muscle between the esophagus and the stomach (lower esophageal sphincter, or LES). Normally, the LES muscle closes after food passes through the esophagus to the stomach. When the LES is weakened or abnormal, it does not close properly, and that allows food and stomach acid to go back up into the esophagus. ?The LES can be weakened by certain dietary substances, medicines, and medical conditions, including: ?Tobacco use. ?Pregnancy. ?Having a hiatal hernia. ?Alcohol use. ?Certain foods and beverages, such as coffee, chocolate, onions, and peppermint. ?What increases the risk? ?You are more likely to develop this condition if you: ?Have an increased body weight. ?Have a connective tissue disorder. ?Take NSAIDs, such as ibuprofen. ?What are the signs or symptoms? ?Symptoms of this condition include: ?Heartburn. ?Difficult or painful swallowing and the feeling of having a lump in the throat. ?A bitter taste in the mouth. ?Bad breath and having a large amount of saliva. ?Having an upset or bloated stomach and belching. ?Chest pain. Different conditions can  cause chest pain. Make sure you see your health care provider if you experience chest pain. ?Shortness of breath or wheezing. ?Ongoing (chronic) cough or a nighttime cough. ?Wearing away of tooth enamel. ?Weight loss. ?How is this diagnosed? ?This condition may be diagnosed based on a medical history and a physical exam. To determine if you have mild or severe GERD, your health care provider may also monitor how you respond to treatment. You may also have tests, including: ?A test to examine your stomach and esophagus with a small camera (endoscopy). ?A test that measures the acidity level in your esophagus. ?A test that measures how much pressure is on your esophagus. ?A barium swallow or modified barium swallow test to show the shape, size, and functioning of your esophagus. ?How is this treated? ?Treatment for this condition may vary depending on how severe your symptoms are. Your health care provider may recommend: ?Changes to your diet. ?Medicine. ?Surgery. ?The goal of treatment is to help relieve your symptoms and to prevent complications. ?Follow these instructions at home: ?Eating and drinking ? ?Follow a diet as recommended by your health care provider. This may involve avoiding foods and drinks such as: ?Coffee and tea, with or without caffeine. ?Drinks that contain alcohol. ?Energy drinks and sports drinks. ?Carbonated drinks or sodas. ?Chocolate and cocoa. ?Peppermint and mint flavorings. ?Garlic and onions. ?Horseradish. ?Spicy and acidic foods, including peppers, chili powder, curry powder, vinegar, hot sauces, and barbecue sauce. ?Citrus fruit juices and citrus fruits, such as oranges, lemons, and limes. ?Tomato-based foods, such as red sauce, chili, salsa, and pizza with red sauce. ?Fried and fatty foods, such as donuts, french fries, potato chips, and high-fat dressings. ?  High-fat meats, such as hot dogs and fatty cuts of red and white meats, such as rib eye steak, sausage, ham, and  bacon. ?High-fat dairy items, such as whole milk, butter, and cream cheese. ?Eat small, frequent meals instead of large meals. ?Avoid drinking large amounts of liquid with your meals. ?Avoid eating meals during the 2-3 hours before bedtime. ?Avoid lying down right after you eat. ?Do not exercise right after you eat. ?Lifestyle ? ?Do not use any products that contain nicotine or tobacco. These products include cigarettes, chewing tobacco, and vaping devices, such as e-cigarettes. If you need help quitting, ask your health care provider. ?Try to reduce your stress by using methods such as yoga or meditation. If you need help reducing stress, ask your health care provider. ?If you are overweight, reduce your weight to an amount that is healthy for you. Ask your health care provider for guidance about a safe weight loss goal. ?General instructions ?Pay attention to any changes in your symptoms. ?Take over-the-counter and prescription medicines only as told by your health care provider. Do not take aspirin, ibuprofen, or other NSAIDs unless your health care provider told you to take these medicines. ?Wear loose-fitting clothing. Do not wear anything tight around your waist that causes pressure on your abdomen. ?Raise (elevate) the head of your bed about 6 inches (15 cm). You can use a wedge to do this. ?Avoid bending over if this makes your symptoms worse. ?Keep all follow-up visits. This is important. ?Contact a health care provider if: ?You have: ?New symptoms. ?Unexplained weight loss. ?Difficulty swallowing or it hurts to swallow. ?Wheezing or a persistent cough. ?A hoarse voice. ?Your symptoms do not improve with treatment. ?Get help right away if: ?You have sudden pain in your arms, neck, jaw, teeth, or back. ?You suddenly feel sweaty, dizzy, or light-headed. ?You have chest pain or shortness of breath. ?You vomit and the vomit is green, yellow, or black, or it looks like blood or coffee grounds. ?You faint. ?You  have stool that is red, bloody, or black. ?You cannot swallow, drink, or eat. ?These symptoms may represent a serious problem that is an emergency. Do not wait to see if the symptoms will go away. Get medical help right away. Call your local emergency services (911 in the [place] Do not drive yourself to the hospital. ?Summary ?Gastroesophageal reflux happens when acid from the stomach flows up into the esophagus. GERD is a disease in which the reflux happens often, causes frequent or severe symptoms, or causes problems such as damage to the esophagus. ?Treatment for this condition may vary depending on how severe your symptoms are. Your health care provider may recommend diet and lifestyle changes, medicine, or surgery. ?Contact a health care provider if you have new or worsening symptoms. ?Take over-the-counter and prescription medicines only as told by your health care provider. Do not take aspirin, ibuprofen, or other NSAIDs unless your health care provider told you to do so. ?Keep all follow-up visits as told by your health care provider. This is important. ?This information is not intended to replace advice given to you by your health care provider. Make sure you discuss any questions you have with your health care provider. ?Document Revised: 02/20/2020 Document Reviewed: 02/20/2020 ?[employer] Patient Education ? 2022 [employer] ? ?

## 2021-10-31 DIAGNOSIS — M85852 Other specified disorders of bone density and structure, left thigh: Secondary | ICD-10-CM | POA: Diagnosis not present

## 2021-11-01 NOTE — Chronic Care Management (AMB) (Signed)
Chronic Care Management   CCM RN Visit Note  10/25/2021 Name: Susan Contreras MRN: [id number] DOB: 1941-07-15  Subjective: Susan Contreras is a 81 y.o. year old female who is a primary care patient of [name], FNP. The care management team was consulted for assistance with disease management and care coordination needs.    Engaged with patient by telephone for follow up visit in response to provider referral for case management and/or care coordination services.   Consent to Services:  The patient was given information about Chronic Care Management services, agreed to services, and gave verbal consent prior to initiation of services.  Please see initial visit note for detailed documentation.   Patient agreed to services and verbal consent obtained.   Assessment: Review of patient past medical history, allergies, medications, health status, including review of consultants reports, laboratory and other test data, was performed as part of comprehensive evaluation and provision of chronic care management services.   SDOH (Social Determinants of Health) assessments and interventions performed:    CCM Care Plan  Allergies  Allergen Reactions   Biaxin [Clarithromycin]    Cephalexin    Ciprofloxacin    Doxycycline     Headaches   Flagyl [Metronidazole Hcl]    Ketek [Telithromycin]    Sulfa Antibiotics     Outpatient Encounter Medications as of 10/25/2021  Medication Sig   [EXPIRED] amoxicillin (AMOXIL) 500 MG capsule Take 1 capsule (500 mg total) by mouth 2 (two) times daily for 10 days.   atorvastatin (LIPITOR) 20 MG tablet Take 1 tablet (20 mg total) by mouth daily.   Chlorphen-PE-Acetaminophen (NOREL AD PO) Take 1 tablet by mouth as needed.    Cholecalciferol (VITAMIN D3) 125 MCG (5000 UT) CAPS Take 1 capsule (5,000 Units total) by mouth daily.   famotidine (PEPCID) 40 MG tablet Take 1 tablet (40 mg total) by mouth daily.   fluticasone (FLONASE) 50 MCG/ACT nasal spray Place 1  spray into both nostrils daily.   irbesartan (AVAPRO) 75 MG tablet TAKE ONE TABLET ONCE DAILY   levothyroxine (SYNTHROID) 50 MCG tablet Take 1 tablet (50 mcg total) by mouth daily.   meclizine (ANTIVERT) 25 MG tablet TAKE ONE TABLET BY MOUTH THREE TIMES DAILY AS NEEDED FOR DIZZINESS   pantoprazole (PROTONIX) 20 MG tablet Take 1 tablet (20 mg total) by mouth 2 (two) times daily before a meal.   traZODone (DESYREL) 50 MG tablet Take 1 tablet (50 mg total) by mouth at bedtime.   [DISCONTINUED] ALPRAZolam (XANAX) 0.25 MG tablet TAKE 1 TABLET TWICE DAILY AS NEEDED FOR ANXIETY   No facility-administered encounter medications on file as of 10/25/2021.    Patient Active Problem List   Diagnosis Date Noted   Chronic kidney disease, stage 3 ([place]) 08/30/2021   Insomnia 05/07/2021   History of colonic polyps 04/26/2020   Benzodiazepine dependence ([place]) 05/19/2018   Controlled substance agreement signed 05/19/2018   Obesity (BMI 30.0-34.9) 74/03/1447   Metabolic syndrome 18/56/3149   GAD (generalized anxiety disorder) 08/14/2015   Allergic rhinitis 08/14/2015   GERD (gastroesophageal reflux disease) 08/14/2015   Hyperlipidemia 08/14/2015   Vitamin D deficiency 08/14/2015   Hypothyroidism 05/29/2014   Cholelithiasis 01/15/2011   Family history of colon cancer 01/15/2011   Hypertension 11/11/2007   ESOPHAGEAL STRICTURE 11/11/2007   Diaphragmatic hernia 11/11/2007   DIVERTICULOSIS OF COLON 11/11/2007    Conditions to be addressed/monitored:HTN, Anxiety, and Hypothyroidism  Care Plan : [facility] Care Plan     Problem: Chronic Disease Management Needs  Priority: High  Onset Date: 07/31/2021     Long-Range Goal: Work with RN Care Manager Regarding Care Coordination and Care Management Associated with HTN, GAD, GERD, HLD, Hypothyroidism,Depression, CKD   Start Date: 07/31/2021  Expected End Date: 07/31/2022  This Visit's Progress: On track  Recent Progress: On track  Priority: High  Note:    Current Barriers:  Chronic Disease Management support and education needs related to HTN, HLD, and GERD, hypothyroidism, depression, CKD, anxiety Caregiver strain  RNCM Clinical Goal(s):  Patient will continue to work with Consulting civil engineer and/or Social Worker to address care management and care coordination needs related to HTN, HLD, and GERD, hypothyroidism, depression, and anxiety as evidenced by adherence to CM Team Scheduled appointments     through collaboration with RN Care manager, provider, and care team.  Patient will demonstrate improved self health maintenance by keeping all medical appointments  Interventions: 1:1 collaboration with primary care provider regarding development and update of comprehensive plan of care as evidenced by provider attestation and co-signature Inter-disciplinary care team collaboration (see longitudinal plan of care) Evaluation of current treatment plan related to  self management and patient's adherence to plan as established by provider   Health Maintenance (Status: Condition stable. Not addressed this visit.)  Short Term Goal  Patient interviewed about adult health maintenance status including Colonoscopy, pneumonia Vaccine, Yearly Influenza vaccine, COVID vaccinations, Mammogram   Advised patient to discuss Pneumonia Vaccine with primary care provider. Recommended Pneumovax after 08/07/21 Documented Historical Immunization accuracy confirmed  Patient provided CDC guideline information about Pneumonia vaccine Previously assisted in scheduling screening mammogram at [facility] [address] on 08/07/21 at 11:30 and sent patient a My Chart Message with a copy of the appointment confirmation and advised to reach out to [facility] directly if she needs to reschedule Patient cancelled appointment Collaborated with [facility] staff to schedule patient on the mobile mammography unit at [facility] for 09/23/21   Hypothyroidism:  (Status: Condition stable. Not addressed this visit.) Long Term  Goal  Lab Results  Component Value Date   TSH 3.080 07/10/2021  Reviewed and discussed medications and importance of compliance Reviewed and discussed most recent lab results and normal TSH Encouraged patient to f/u with PCP and endocrinologist as recommended and to have TSH rechecked as instructed Discussed low energy and fatigue. Encouraged to increase activity level as tolerated   Hypertension: (Status: Goal on Track (progressing): YES.) Last practice recorded BP readings:  BP Readings from Last 3 Encounters:  10/29/21 127/67  07/10/21 (!) 143/78  01/23/21 117/67   Evaluation of current treatment plan related to hypertension self management and patient's adherence to plan as established by provider;   Reviewed medications with patient and discussed importance of compliance;  Counseled on the importance of exercise goals with target of 150 minutes per week Discussed plans with patient for ongoing care management follow up and provided patient with direct contact information for care management team; Advised patient, providing education and rationale, to monitor blood pressure daily and record, calling PCP for findings outside established parameters;  Discussed complications of poorly controlled blood pressure such as heart disease, stroke, circulatory complications, vision complications, kidney impairment, sexual dysfunction;  Assessed social determinant of health barriers;  Monitor for s/s of hypotension r/t blood pressure medication   Hyperlipidemia:  (Status: Condition stable. Not addressed this visit.) Long Term Goal  Lab Results  Component Value Date   CHOL 225 (H) 04/13/2020   HDL 51 04/13/2020   LDLCALC 121 (H) 04/13/2020   TRIG 302 (  H) 04/13/2020   CHOLHDL 4.4 04/13/2020   Medication review performed; medication list updated in electronic medical record.  Provider established cholesterol goals reviewed; Counseled on importance of regular laboratory monitoring as  prescribed; Reviewed role and benefits of statin for ASCVD risk reduction; Reviewed importance of limiting foods high in cholesterol; Reviewed exercise goals and target of 150 minutes per week; Recommended having lipid panel done at least once a year. Advised that it is overdue and was not checked at her last visit. Recommended that she have it at her next PCP visit.     GERD:  (Status: Condition stable. Not addressed this visit.) Long Term Goal  Evaluation of treatment plan related to GERD and patient's adherence Reviewed and discussed medications and importance of compliance: Prevacid daily and prilosec twice daily Reviewed and discussed most recent GI visit and encouraged to f/u with GI if she continues to have GERD and dysphagia despite treatment with PPI and H2 blocker. Discussed diet and foods that make GERD worse Verbal education provided on ways to improve GERD symptoms   Constipation:  (Status: Goal on track: NO.) Short Term Goal  Discussed symptoms of constipation Reviewed and discussed medications and treatments Cautioned against routine use of stimulant laxatives and provided reasoning  Encouraged use of stool softeners and/or miralax for constipation and magnesium supplement for maintenance  Encouraged increased water intake Discussed role of fiber in constipation prevention and management Verbal education on proper diet provided. Fruits and vegetables encouraged. Whole grains can be helpful if taken with enough water.  Discussed that GI follow-up may be needed if bleeding continues or constipation persists  Discussed most recent colonoscopies and although screening colonoscopies are necessary for her at this point, a diagnostic colonoscopy may be needed Advised to reach out to PCP with any new or worsening symptoms   Falls:  (Status: Condition stable. Not addressed this visit.) Long Term Goal  Reviewed medications and discussed potential side effects of medications such as  dizziness and frequent urination Advised patient of importance of notifying provider of falls Assessed for signs and symptoms of orthostatic hypotension Assessed for falls since last encounter Discussed mechanical fall 2 weeks ago on her deck where she tripped over a nail. No head injury and no loss of consciousness. Denies headache or dizziness. Her lip is healing. No additional complaints related to fall. Encouraged to be aware of surroundings and to move carefully and change positions slowly to decrease risk for falls   Chronic Kidney Disease (Status: Condition stable. Not addressed this visit.)  Long Term Goal  Last practice recorded BP readings:  BP Readings from Last 3 Encounters:  07/10/21 (!) 143/78  01/23/21 117/67  08/13/20 123/72  Most recent eGFR/CrCl:  Lab Results  Component Value Date   EGFR 40 (L) 07/10/2021    No components found for: CRCL  Lab Results  Component Value Date   CREATININE 1.37 (H) 07/10/2021   BUN 10 07/10/2021   NA 139 07/10/2021   K 4.4 07/10/2021   CL 99 07/10/2021   CO2 20 07/10/2021   Assessed the patient     understanding of chronic kidney disease    Evaluation of current treatment plan related to chronic kidney disease self management and patient's adherence to plan as established by provider      Reviewed medications with patient and discussed importance of compliance    Counseled on the importance of exercise goals with target of 150 minutes per week     Collaborated with PCP  to add CKD stage 3 to problem list based on elevated creatinine and decreased GFR Support coping and stress management by recognizing current strategies and assist in developing new strategies such as mindfulness, journaling, relaxation techniques, problem-solving     Discussed that fatigue can be a symptom of CKD Encouraged patient to increase activity level as tolerated and to rest as needed Reviewed and discussed recent lab results, including CBC. Patient does not  appear to have anemia related to CKD Reviewed upcoming appointments Encouraged patient to reach out to PCP with any new or worsening symptoms Therapeutic listening utilized regarding health related anxiety and diagnosis of CKD Discussed OTC vitamins. Patient has restarted vit D and does feel that it's helping her energy level some Questions answered regarding potential b12 deficiency. Reviewed available labs and advised that mots recent b12 levels were normal Recommended a B Complex vitamin if she would like to try it. May help with energy. Specific B12 supplement or injections aren't needed since levels are normal and wouldn't likely be effective   Anxiety:  (Status: Goal on Track (progressing): YES.) Long Term Goal  Therapeutic listening utilized regarding personal and family health related anxiety  Encouraged to talk with LCSW and reviewed upcoming appointment Previously recommended that she decrease the amount of news reports that she watches each day and explained that those can increase anxiety in a person that is prone to excessive worrying Encouraged to seek medical attention for any new or worsening symptoms   Patient Goals/Self-Care Activities: Take medications as prescribed   Attend all scheduled provider appointments Perform all self care activities independently  Perform IADL's (shopping, preparing meals, housekeeping, managing finances) independently Call provider office for new concerns or questions  check blood pressure 3 times per week write blood pressure results in a log or diary take blood pressure log to all doctor appointments call doctor for signs and symptoms of high blood pressure - take all medications exactly as prescribed Check lipid panel at next PCP visit Limit foods that spicy or acid foods that cause GERD to worsen Call PCP or GI with any new or worsening symptoms Talk with LCSW regarding stress/anxiety Call RN Care Manager as needed [phone] Use a  stool to put your feet on when having a bowel movement Do not strain to have a bowel movement Increase water intake Increase intake of fruits and vegetables Use miralax or stool softeners if needed Avoid stimulant laxatives Take magnesium supplement to prevent constipation Move carefully and change positions slowly to decrease risk of falls Increase activity level as tolerated with a goal of 150 minutes a week Take Vit D and can try a B complex vitamin Talk with PCP re: CKD diagnosis Talk with LCSW regarding anxiety and psychosocial concerns Decrease amount of news watched each day  Plan:Telephone follow up appointment with care management team member scheduled for:  12/04/21 with RNCM The patient has been provided with contact information for the care management team and has been advised to call with any health related questions or concerns.   Chong Sicilian, BSN, RN-BC Embedded Chronic Care Manager [facility] [place] [facility] Direct Dial: [phone]

## 2021-11-01 NOTE — Patient Instructions (Signed)
Visit Information ? ?Patient Goals/Self-Care Activities: ?Take medications as prescribed   ?Attend all scheduled provider appointments ?Perform all self care activities independently  ?Perform IADL's (shopping, preparing meals, housekeeping, managing finances) independently ?Call provider office for new concerns or questions  ?check blood pressure 3 times per week ?write blood pressure results in a log or diary ?take blood pressure log to all doctor appointments ?call doctor for signs and symptoms of high blood pressure ?- take all medications exactly as prescribed ?Check lipid panel at next PCP visit ?Limit foods that spicy or acid foods that cause GERD to worsen ?Call PCP or GI with any new or worsening symptoms ?Talk with LCSW regarding stress/anxiety ?Call RN Care Manager as needed [phone] ?Use a stool to put your feet on when having a bowel movement ?Do not strain to have a bowel movement ?Increase water intake ?Increase intake of fruits and vegetables ?Use miralax or stool softeners if needed ?Avoid stimulant laxatives ?Take magnesium supplement to prevent constipation ?Move carefully and change positions slowly to decrease risk of falls ?Increase activity level as tolerated with a goal of 150 minutes a week ?Take Vit D and can try a B complex vitamin ?Talk with PCP re: CKD diagnosis ?Talk with LCSW regarding anxiety and psychosocial concerns ?Decrease amount of news watched each day ? ?Patient verbalizes understanding of instructions and care plan provided today and agrees to view in [place]. Active MyChart status confirmed with patient.   ? ?Plan:Telephone follow up appointment with care management team member scheduled for:  12/04/21 with RNCM ?The patient has been provided with contact information for the care management team and has been advised to call with any health related questions or concerns.  ? ?Chong Sicilian, BSN, RN-BC ?Embedded Chronic Care Manager ?[place] [facility] [place] [facility] ?Direct Dial: [phone] ?  ?

## 2021-11-04 ENCOUNTER — Other Ambulatory Visit: Payer: Self-pay | Admitting: Family

## 2021-11-04 ENCOUNTER — Encounter: Payer: Self-pay | Admitting: Family Medicine

## 2021-11-04 DIAGNOSIS — M8580 Other specified disorders of bone density and structure, unspecified site: Secondary | ICD-10-CM | POA: Insufficient documentation

## 2021-11-18 ENCOUNTER — Encounter: Payer: Self-pay | Admitting: Family Medicine

## 2021-11-18 ENCOUNTER — Ambulatory Visit (INDEPENDENT_AMBULATORY_CARE_PROVIDER_SITE_OTHER): Payer: Medicare HMO | Admitting: Family Medicine

## 2021-11-18 DIAGNOSIS — J0111 Acute recurrent frontal sinusitis: Secondary | ICD-10-CM

## 2021-11-18 MED ORDER — IPRATROPIUM BROMIDE 0.03 % NA SOLN: 2.0000 | Freq: Two times a day (BID) | NASAL | 12 refills | Status: DC

## 2021-11-18 MED ORDER — AMOXICILLIN 500 MG PO CAPS
500.0000 mg | ORAL_CAPSULE | Freq: Two times a day (BID) | ORAL | 0 refills | Status: DC
Start: 2021-11-18 — End: 2021-12-12

## 2021-11-18 MED ORDER — PREDNISONE 20 MG PO TABS
ORAL_TABLET | ORAL | 0 refills | Status: DC
Start: 2021-11-18 — End: 2021-12-12

## 2021-11-18 NOTE — Progress Notes (Signed)
? ?Virtual Visit via telephone Note ? ?I connected with Susan Contreras on 11/18/21 at 1333 by telephone and verified that I am speaking with the correct person using two identifiers. Susan Contreras is currently located at home and patient are currently with her during visit. The provider, Fransisca Kaufmann Serafino Burciaga, MD is located in their office at time of visit. ? ?Call ended at 1341 ? ?I discussed the limitations, risks, security and privacy concerns of performing an evaluation and management service by telephone and the availability of in person appointments. I also discussed with the patient that there may be a patient responsible charge related to this service. The patient expressed understanding and agreed to proceed. ? ? ?History and Present Illness: ?She is calling in for dizziness and headaches and this time of year she gets like this.  She feels tired and has sinus and allergy pressure and it has been worsening. She has more sinus pressure on left eye and has pressure in both ears.  It got bad a couple days ago.  She did amoxicillin a month ago.  She denies fevers or chills. If she gets outside she gets like this. She takes norel AD and it helps some.  She uses flonase as well.  ? ?1. Acute recurrent frontal sinusitis   ? ? ? ? ? ?Review of Systems  ?Constitutional:  Negative for chills and fever.  ?HENT:  Positive for congestion, postnasal drip, rhinorrhea, sinus pressure, sneezing and sore throat. Negative for ear discharge and ear pain.   ?Eyes:  Negative for pain, redness and visual disturbance.  ?Respiratory:  Positive for cough. Negative for chest tightness, shortness of breath and wheezing.   ?Cardiovascular:  Negative for chest pain and leg swelling.  ?Genitourinary:  Negative for difficulty urinating and dysuria.  ?Musculoskeletal:  Negative for back pain and gait problem.  ?Skin:  Negative for rash.  ?Neurological:  Negative for light-headedness and headaches.  ?Psychiatric/Behavioral:  Negative for  agitation and behavioral problems.   ?All other systems reviewed and are negative. ? ?Observations/Objective: ?Patient sounds comfortable and in no acute distress ? ?Assessment and Plan: ?Problem List Items Addressed This Visit   ?None ?Visit Diagnoses   ? ? Acute recurrent frontal sinusitis    -  Primary  ? Relevant Medications  ? predniSONE (DELTASONE) 20 MG tablet  ? ipratropium (ATROVENT) 0.03 % nasal spray  ? amoxicillin (AMOXIL) 500 MG capsule  ? ?  ?  ?Patient has recurrent sinus issues, sounds like possibly infection versus allergies, will give a little bit of prednisone this time along with the amoxicillin because it came back last time. ?Follow up plan: ?Return if symptoms worsen or fail to improve. ? ? ?  ?I discussed the assessment and treatment plan with the patient. The patient was provided an opportunity to ask questions and all were answered. The patient agreed with the plan and demonstrated an understanding of the instructions. ?  ?The patient was advised to call back or seek an in-person evaluation if the symptoms worsen or if the condition fails to improve as anticipated. ? ?The above assessment and management plan was discussed with the patient. The patient verbalized understanding of and has agreed to the management plan. Patient is aware to call the clinic if symptoms persist or worsen. Patient is aware when to return to the clinic for a follow-up visit. Patient educated on when it is appropriate to go to the emergency department.  ? ? ?I provided 8 minutes of non-face-to-face time  during this encounter. ? ? ? ?Worthy Rancher, MD ?  ? ?

## 2021-11-19 ENCOUNTER — Ambulatory Visit: Payer: Medicare HMO | Admitting: Licensed Clinical Social Worker

## 2021-11-19 DIAGNOSIS — I159 Secondary hypertension, unspecified: Secondary | ICD-10-CM

## 2021-11-19 DIAGNOSIS — F320 Major depressive disorder, single episode, mild: Secondary | ICD-10-CM

## 2021-11-19 DIAGNOSIS — K219 Gastro-esophageal reflux disease without esophagitis: Secondary | ICD-10-CM

## 2021-11-19 DIAGNOSIS — E785 Hyperlipidemia, unspecified: Secondary | ICD-10-CM

## 2021-11-19 DIAGNOSIS — F411 Generalized anxiety disorder: Secondary | ICD-10-CM

## 2021-11-19 NOTE — Chronic Care Management (AMB) (Signed)
?Chronic Care Management  ? ? Clinical Social Work Note ? ?11/19/2021 ?Name: Susan Contreras MRN: [id number] DOB: Feb 24, 1941 ? ?Susan Contreras is a 81 y.o. year old female who is a primary care patient of [name], FNP. The CCM team was consulted to assist the patient with chronic disease management and/or care coordination needs related to: [name] .  ? ?Engaged with patient by telephone for follow up visit in response to provider referral for social work chronic care management and care coordination services.  ? ?Consent to Services:  ?The patient was given information about Chronic Care Management services, agreed to services, and gave verbal consent prior to initiation of services.  Please see initial visit note for detailed documentation.  ? ?Patient agreed to services and consent obtained.  ? ?Assessment: Review of patient past medical history, allergies, medications, and health status, including review of relevant consultants reports was performed today as part of a comprehensive evaluation and provision of chronic care management and care coordination services.    ? ?SDOH (Social Determinants of Health) assessments and interventions performed:  ?SDOH Interventions   ? ?Flowsheet Row Most Recent Value  ?SDOH Interventions   ?Stress Interventions Provide Counseling  [client has stress related to managing medical needs]  ?Depression Interventions/Treatment  Medication, Counseling  ? ?  ?  ? ?Advanced Directives Status: See [employer]yn[employer] application for related entries. ? ?CCM Care Plan ? ?Allergies  ?Allergen Reactions  ? Biaxin [Clarithromycin]   ? Cephalexin   ? Ciprofloxacin   ? Doxycycline   ?  Headaches  ? Flagyl [Metronidazole Hcl]   ? Ketek [Telithromycin]   ? Pneumovax [Pneumococcal Polysaccharide Vaccine]   ? Sulfa Antibiotics   ? ? ?Outpatient Encounter Medications as of 11/19/2021  ?Medication Sig  ? ALPRAZolam (XANAX) 0.25 MG tablet TAKE 1 TABLET TWICE DAILY AS NEEDED FOR ANXIETY  ? amoxicillin  (AMOXIL) 500 MG capsule Take 1 capsule (500 mg total) by mouth 2 (two) times daily.  ? atorvastatin (LIPITOR) 20 MG tablet Take 1 tablet (20 mg total) by mouth daily.  ? Chlorphen-PE-Acetaminophen (NOREL AD PO) Take 1 tablet by mouth as needed.   ? Cholecalciferol (VITAMIN D3) 125 MCG (5000 UT) CAPS Take 1 capsule (5,000 Units total) by mouth daily.  ? famotidine (PEPCID) 40 MG tablet Take 1 tablet (40 mg total) by mouth daily.  ? fluticasone (FLONASE) 50 MCG/ACT nasal spray Place 1 spray into both nostrils daily.  ? ipratropium (ATROVENT) 0.03 % nasal spray Place 2 sprays into both nostrils every 12 (twelve) hours.  ? irbesartan (AVAPRO) 75 MG tablet TAKE ONE TABLET ONCE DAILY  ? levothyroxine (SYNTHROID) 50 MCG tablet Take 1 tablet (50 mcg total) by mouth daily.  ? meclizine (ANTIVERT) 25 MG tablet TAKE ONE TABLET BY MOUTH THREE TIMES DAILY AS NEEDED FOR DIZZINESS  ? pantoprazole (PROTONIX) 20 MG tablet Take 1 tablet (20 mg total) by mouth 2 (two) times daily before a meal.  ? predniSONE (DELTASONE) 20 MG tablet 2 po at same time daily for 5 days  ? traZODone (DESYREL) 50 MG tablet Take 1 tablet (50 mg total) by mouth at bedtime.  ? ?No facility-administered encounter medications on file as of 11/19/2021.  ? ? ?Patient Active Problem List  ? Diagnosis Date Noted  ? Osteopenia 11/04/2021  ? Chronic kidney disease, stage 3 ([place]) 08/30/2021  ? Insomnia 05/07/2021  ? History of colonic polyps 04/26/2020  ? Benzodiazepine dependence ([place]) 05/19/2018  ? Controlled substance agreement signed 05/19/2018  ?  Obesity (BMI 30.0-34.9) 11/26/2017  ? Metabolic syndrome 06/10/5101  ? GAD (generalized anxiety disorder) 08/14/2015  ? Allergic rhinitis 08/14/2015  ? GERD (gastroesophageal reflux disease) 08/14/2015  ? Hyperlipidemia 08/14/2015  ? Vitamin D deficiency 08/14/2015  ? Hypothyroidism 05/29/2014  ? Cholelithiasis 01/15/2011  ? Family history of colon cancer 01/15/2011  ? Hypertension 11/11/2007  ? ESOPHAGEAL STRICTURE  11/11/2007  ? Diaphragmatic hernia 11/11/2007  ? DIVERTICULOSIS OF COLON 11/11/2007  ? ? ?Conditions to be addressed/monitored: monitor client management of anxiety issues and stress issues ? ?Care Plan : General Social Work (Adult)  ?Updates made by Katha Cabal, LCSW since 11/19/2021 12:00 AM  ?  ? ?Problem: Emotional Distress   ?  ? ?Goal: Client needs help in managing anxiety and stress issues faced   ?Start Date: 11/19/2021  ?Expected End Date: 02/17/2022  ?This Visit's Progress: On track  ?Recent Progress: On track  ?Priority: Medium  ?Note:   ?Current Barriers:  ?Chronic Mental Health needs related to anxiety and stress issues faced ?Limited social support   ?Suicidal Ideation/Homicidal Ideation: No ?Low energy, fatigued occasionally ? ?Clinical Social Work Goal(s):  ?patient will work with SW monthly by telephone or in person to reduce or manage symptoms related to anxiety or stress issues faced ?patient will work with SW monthly to address concerns related to ongoing in home care needs of her spouse ?Patient will call RNCM as needed in next 30 days to discuss nursing needs of client ?Patient will attend scheduled medical appointments in next 30 days ? ?Interventions: ?1:1 collaboration with [name], FNP regarding development and update of comprehensive plan of care as evidenced by provider attestation and co-signature  ?Reviewed with client the ongoing care needs of her spouse ?Discussed with client the sleeping issues of client.  Client has difficulty sleeping ?Discussed with client the mood status of client.  She said she does get a little sad occasionally. She said she does get anxious sometimes in managing her health needs. She gets anxious sometimes thinking about care needs of her spouse. She spoke of taking Xanax. She is taking Xanax as prescribed. ?Discussed support from [facility] for nursing needs of client. Encouraged Laisha to call RNCM as needed for nursing support. ?Discussed with client  medication procurement of client ?Provided counseling support for client ?Encouraged As[place]ynn to call LCSW as needed in next 30 days to discuss social work needs of client ?Reviewed energy level of client. She said she does get fatigued more often ?Discussed client fall in January of 2022. She said she is careful when walking ?Encouraged client to call [facility] Triage Nurse as needed for nursing support ? ?Patient Self Care Activities:  ?Self administers medications as prescribed ?Attends all scheduled provider appointments ?Calls provider office for new concerns or questions ? ?Patient Coping Strengths:  ?Supportive Relationships ?Family ?Friends ? ?Patient Self Care Deficits:  ?Anxiety or stress issues related to managing ongoing chronic care needs of her spouse ? ?Patient Goals:  ?- spend time or talk with others every day ?- practice relaxation or meditation daily ?- keep a calendar with appointment dates ? ?Follow Up Plan: LCSW to call client on  01/09/22 at 2:00 PM to assess client needs   ?  ?Norva Riffle.Ota Ebersole MSW, LCSW ?Licensed Clinical Social Worker ?[place] [facility] ?[phone]  ?

## 2021-11-19 NOTE — Patient Instructions (Addendum)
Visit Information ? ?Patient Goals:  Manage My Emotions; Manage Anxiety and Stress issues ? ?Timeframe:  Short-Term Goal ?Priority:  Medium ?Progress: On Track ?Start Date:     11/19/21           ?Expected End Date:    02/11/22                  ? ?Follow Up Date 01/09/22 at 2:00 PM  ?  ?Manage My Emotions; Manage Anxiety and Stress issues  ?  ?Why is this important?   ?When you are stressed, down or upset, your body reacts too.  ?For example, your blood pressure may get higher; you may have a headache or stomachache.  ?When your emotions get the best of you, your body's ability to fight off cold and flu gets weak.  ?These steps will help you manage your emotions.   ? ?Coping Skills of Patient: ?Has some family support from her spouse and from her son ?Has no driving issues ?Has food supply; has medications prescribed; has no walking issues ? ?Patient Deficits: ? ?Anxiety and stress issues ?Some irritability occasionally ?Sometimes forgets to take prescribed medications on schedule ? ?Patient Goals: In next 30 days, patient will: ?Attend scheduled medical appointments ?Take medications as prescribed ?Communicate with spouse and son about her ongoing needs ? ?Follow Up Plan:  LCSW to call client on 01/09/22 at 2:00 PM to assess client needs at that time.  ? ?Norva Riffle.Velicia Dejager MSW, LCSW ?Licensed Clinical Social Worker ?[place] [facility] ?[phone] ?

## 2021-11-22 DIAGNOSIS — E785 Hyperlipidemia, unspecified: Secondary | ICD-10-CM | POA: Diagnosis not present

## 2021-11-22 DIAGNOSIS — E039 Hypothyroidism, unspecified: Secondary | ICD-10-CM

## 2021-11-22 DIAGNOSIS — I10 Essential (primary) hypertension: Secondary | ICD-10-CM

## 2021-12-04 ENCOUNTER — Telehealth: Payer: Medicare HMO

## 2021-12-05 ENCOUNTER — Other Ambulatory Visit: Payer: Medicare HMO

## 2021-12-05 ENCOUNTER — Other Ambulatory Visit: Payer: Self-pay | Admitting: Family Medicine

## 2021-12-05 DIAGNOSIS — E039 Hypothyroidism, unspecified: Secondary | ICD-10-CM

## 2021-12-05 DIAGNOSIS — I10 Essential (primary) hypertension: Secondary | ICD-10-CM | POA: Diagnosis not present

## 2021-12-05 DIAGNOSIS — E785 Hyperlipidemia, unspecified: Secondary | ICD-10-CM

## 2021-12-06 LAB — CBC WITH DIFFERENTIAL/PLATELET
Basophils Absolute: 0.1 10*3/uL (ref 0.0–0.2)
Basos: 1 %
EOS (ABSOLUTE): 0.4 10*3/uL (ref 0.0–0.4)
Eos: 4 %
Hematocrit: 36.6 % (ref 34.0–46.6)
Hemoglobin: 12.4 g/dL (ref 11.1–15.9)
Immature Grans (Abs): 0 10*3/uL (ref 0.0–0.1)
Immature Granulocytes: 0 %
Lymphocytes Absolute: 3.5 10*3/uL — ABNORMAL HIGH (ref 0.7–3.1)
Lymphs: 36 %
MCH: 29.7 pg (ref 26.6–33.0)
MCHC: 33.9 g/dL (ref 31.5–35.7)
MCV: 88 fL (ref 79–97)
Monocytes Absolute: 0.7 10*3/uL (ref 0.1–0.9)
Monocytes: 7 %
Neutrophils Absolute: 5 10*3/uL (ref 1.4–7.0)
Neutrophils: 52 %
Platelets: 293 10*3/uL (ref 150–450)
RBC: 4.17 x10E6/uL (ref 3.77–5.28)
RDW: 13.1 % (ref 11.7–15.4)
WBC: 9.6 10*3/uL (ref 3.4–10.8)

## 2021-12-06 LAB — CMP14+EGFR
ALT: 11 IU/L (ref 0–32)
AST: 19 IU/L (ref 0–40)
Albumin/Globulin Ratio: 1.6 (ref 1.2–2.2)
Albumin: 4.5 g/dL (ref 3.7–4.7)
Alkaline Phosphatase: 86 IU/L (ref 44–121)
BUN/Creatinine Ratio: 6 — ABNORMAL LOW (ref 12–28)
BUN: 8 mg/dL (ref 8–27)
Bilirubin Total: 0.8 mg/dL (ref 0.0–1.2)
CO2: 20 mmol/L (ref 20–29)
Calcium: 9.1 mg/dL (ref 8.7–10.3)
Chloride: 102 mmol/L (ref 96–106)
Creatinine, Ser: 1.3 mg/dL — ABNORMAL HIGH (ref 0.57–1.00)
Globulin, Total: 2.9 g/dL (ref 1.5–4.5)
Glucose: 89 mg/dL (ref 70–99)
Potassium: 4.2 mmol/L (ref 3.5–5.2)
Sodium: 142 mmol/L (ref 134–144)
Total Protein: 7.4 g/dL (ref 6.0–8.5)
eGFR: 42 mL/min/{1.73_m2} — ABNORMAL LOW (ref >59)

## 2021-12-06 LAB — LIPID PANEL
Chol/HDL Ratio: 4.6 ratio — ABNORMAL HIGH (ref 0.0–4.4)
Cholesterol, Total: 210 mg/dL — ABNORMAL HIGH (ref 100–199)
HDL: 46 mg/dL (ref >39)
LDL Chol Calc (NIH): 99 mg/dL (ref 0–99)
Triglycerides: 390 mg/dL — ABNORMAL HIGH (ref 0–149)
VLDL Cholesterol Cal: 65 mg/dL — ABNORMAL HIGH (ref 5–40)

## 2021-12-06 LAB — THYROID PANEL WITH TSH
Free Thyroxine Index: 2.3 (ref 1.2–4.9)
T3 Uptake Ratio: 27 % (ref 24–39)
T4, Total: 8.7 ug/dL (ref 4.5–12.0)
TSH: 1.73 u[IU]/mL (ref 0.450–4.500)

## 2021-12-12 ENCOUNTER — Ambulatory Visit (INDEPENDENT_AMBULATORY_CARE_PROVIDER_SITE_OTHER): Payer: Medicare HMO | Admitting: Nurse Practitioner

## 2021-12-12 ENCOUNTER — Encounter: Payer: Self-pay | Admitting: Nurse Practitioner

## 2021-12-12 VITALS — BP 133/80 | HR 79 | Temp 98.6°F | Resp 20 | Ht 61.0 in | Wt 184.0 lb

## 2021-12-12 DIAGNOSIS — J0101 Acute recurrent maxillary sinusitis: Secondary | ICD-10-CM | POA: Diagnosis not present

## 2021-12-12 DIAGNOSIS — R52 Pain, unspecified: Secondary | ICD-10-CM | POA: Diagnosis not present

## 2021-12-12 LAB — VERITOR FLU A/B WAIVED
Influenza A: NEGATIVE
Influenza B: NEGATIVE

## 2021-12-12 MED ORDER — AMOXICILLIN-POT CLAVULANATE 875-125 MG PO TABS: 1.0000 | ORAL_TABLET | Freq: Two times a day (BID) | ORAL | 0 refills | Status: DC

## 2021-12-12 NOTE — Progress Notes (Signed)
? ?Subjective:  ? ? Patient ID: Susan Contreras, female    DOB: 29-Jun-1941, 81 y.o.   MRN: [id number] ? ? ?Chief Complaint: achy all over, upset stomach, and Nasal Congestion ? ? ?HPI ?Patient comes in today c/o achiness, headaches and nasal congestion. Ha slight nausea. Started 4 days ago and has gradually worsened. ? ? ? ? ?Review of Systems  ?Constitutional:  Positive for fatigue. Negative for chills and fever.  ?HENT:  Positive for congestion, ear pain, postnasal drip, rhinorrhea and sinus pressure. Negative for sore throat and trouble swallowing.   ?Respiratory:  Positive for cough. Negative for shortness of breath.   ?Neurological:  Positive for headaches.  ? ?   ?Objective:  ? Physical Exam ?Vitals and nursing note reviewed.  ?Constitutional:   ?   General: She is not in acute distress. ?   Appearance: Normal appearance. She is well-developed.  ?HENT:  ?   Head: Normocephalic.  ?   Right Ear: Tympanic membrane normal.  ?   Left Ear: Tympanic membrane normal.  ?   Nose: Congestion and rhinorrhea present.  ?   Right Sinus: Maxillary sinus tenderness present.  ?   Left Sinus: Maxillary sinus tenderness present.  ?   Mouth/Throat:  ?   Mouth: Mucous membranes are moist.  ?Eyes:  ?   Pupils: Pupils are equal, round, and reactive to light.  ?Neck:  ?   Vascular: No carotid bruit or JVD.  ?Cardiovascular:  ?   Rate and Rhythm: Normal rate and regular rhythm.  ?   Heart sounds: Normal heart sounds.  ?Pulmonary:  ?   Effort: Pulmonary effort is normal. No respiratory distress.  ?   Breath sounds: Normal breath sounds. No wheezing or rales.  ?Chest:  ?   Chest wall: No tenderness.  ?Abdominal:  ?   General: Abdomen is flat. Bowel sounds are normal. There is no distension or abdominal bruit.  ?   Palpations: Abdomen is soft. There is no hepatomegaly, splenomegaly, mass or pulsatile mass.  ?   Tenderness: There is no abdominal tenderness.  ?Musculoskeletal:     ?   General: Normal range of motion.  ?   Cervical back: Normal  range of motion and neck supple.  ?Lymphadenopathy:  ?   Cervical: No cervical adenopathy.  ?Skin: ?   General: Skin is warm and dry.  ?Neurological:  ?   Mental Status: She is alert and oriented to person, place, and time.  ?   Deep Tendon Reflexes: Reflexes are normal and symmetric.  ?Psychiatric:     ?   Behavior: Behavior normal.     ?   Thought Content: Thought content normal.     ?   Judgment: Judgment normal.  ? ?BP 133/80   Pulse 79   Temp 98.6 ?F (37 ?C) (Temporal)   Resp 20   Ht '5\' 1"'$  (1.549 m)   Wt 184 lb (83.5 kg)   SpO2 100%   BMI 34.77 kg/m?  ? ?Flu negative ? ? ?   ?Assessment & Plan:  ?[name] in today with chief complaint of achy all over, upset stomach, and Nasal Congestion ? ? ?1. Body aches ?- Veritor Flu A/B Waived ?- Novel Coronavirus, NAA ([facility]) ? ?2. Acute recurrent maxillary sinusitis ?1. Take meds as prescribed ?2. Use a cool mist humidifier especially during the winter months and when heat has been humid. ?3. Use saline nose sprays frequently ?4. Saline irrigations of the nose can be very  helpful if done frequently. ? * 4X daily for 1 week* ? * Use of a nettie pot can be helpful with this. Follow directions with this* ?5. Drink plenty of fluids ?6. Keep thermostat turn down low ?7.For any cough or congestion- mucinex or delsym ?8. For fever or aces or pains- take tylenol or ibuprofen appropriate for age and weight. ? * for fevers greater than 101 orally you may alternate ibuprofen and tylenol every  3 hours. ?  ?Meds ordered this encounter  ?Medications  ? amoxicillin-clavulanate (AUGMENTIN) 875-125 MG tablet  ?  Sig: Take 1 tablet by mouth 2 (two) times daily.  ?  Dispense:  14 tablet  ?  Refill:  0  ?  Order Specific Question:   Supervising Provider  ?  Answer:   Caryl Pina A [[id number]]  ? ? ? ? ? ? ?The above assessment and management plan was discussed with the patient. The patient verbalized understanding of and has agreed to the management plan. Patient is aware  to call the clinic if symptoms persist or worsen. Patient is aware when to return to the clinic for a follow-up visit. Patient educated on when it is appropriate to go to the emergency department.  ? ?Mary-Margaret Hassell Done, FNP ? ? ? ?

## 2021-12-12 NOTE — Patient Instructions (Signed)

## 2021-12-13 ENCOUNTER — Other Ambulatory Visit: Payer: Self-pay | Admitting: Family

## 2021-12-13 DIAGNOSIS — I10 Essential (primary) hypertension: Secondary | ICD-10-CM

## 2021-12-13 LAB — NOVEL CORONAVIRUS, NAA: SARS-CoV-2, NAA: NOT DETECTED

## 2022-01-09 ENCOUNTER — Telehealth: Payer: Self-pay | Admitting: Licensed Clinical Social Worker

## 2022-01-09 ENCOUNTER — Telehealth: Payer: Medicare HMO

## 2022-01-09 NOTE — Telephone Encounter (Signed)
  Chronic Care Management     Clinical Social Work Note   01/09/22 Name: Susan Contreras  MRN: [id number]       DOB: [birth year]   Susan Contreras is a 81 y.o. year old female who is a primary care patient of [name], FNP. The CCM team was consulted to assist the patient with chronic disease management and/or care coordination needs related to: [name] .   Unsuccessful phone outreach call to client. LCSW called client phone number 2 times today but was not able to speak with client via phone. Phone answering machine was not set up; thus, LCSW could not leave client a message.   Follow Up Plan. :  LCSW will ask Care Guide scheduler to re-schedule LCSW phone visit with client  Susan Contreras.Susan Contreras MSW, [place] [employer] representative [facility] Care Management [phone]

## 2022-01-15 ENCOUNTER — Telehealth: Payer: Self-pay

## 2022-01-15 ENCOUNTER — Encounter: Payer: Self-pay | Admitting: Cardiology

## 2022-01-15 ENCOUNTER — Ambulatory Visit (INDEPENDENT_AMBULATORY_CARE_PROVIDER_SITE_OTHER): Payer: Medicare HMO | Admitting: Cardiology

## 2022-01-15 ENCOUNTER — Encounter: Payer: Self-pay | Admitting: Licensed Clinical Social Worker

## 2022-01-15 VITALS — BP 126/80 | HR 76 | Ht 61.0 in | Wt 184.0 lb

## 2022-01-15 DIAGNOSIS — I10 Essential (primary) hypertension: Secondary | ICD-10-CM | POA: Diagnosis not present

## 2022-01-15 DIAGNOSIS — E785 Hyperlipidemia, unspecified: Secondary | ICD-10-CM | POA: Diagnosis not present

## 2022-01-15 DIAGNOSIS — R0789 Other chest pain: Secondary | ICD-10-CM

## 2022-01-15 MED ORDER — ATORVASTATIN CALCIUM 20 MG PO TABS: 20.0000 mg | ORAL_TABLET | Freq: Every day | ORAL | 1 refills | Status: DC

## 2022-01-15 NOTE — Patient Instructions (Addendum)
Medication Instructions:  Your physician has recommended you make the following change in your medication:  Hold atorvastatin for 3 weeks, then update [place] on your symptoms of fatigue Continue other medications the same  Labwork: none  Testing/Procedures: none  Follow-Up: Your physician recommends that you schedule a follow-up appointment in: 1 year. You will receive a reminder call in the mail in about 10 months reminding you to call and schedule your appointment. If you don't receive this call, please contact our office.  Any Other Special Instructions Will Be Listed Below (If Applicable).  If you need a refill on your cardiac medications before your next appointment, please call your pharmacy.

## 2022-01-15 NOTE — Telephone Encounter (Signed)
  Chronic Care Management     Clinical Social Work Note   01/15/22 Name: Susan Contreras  MRN: [id number]       DOB: 1940/12/07   Susan Contreras is a 81 y.o. year old female who is a primary care patient of [name], FNP. The CCM team was consulted to assist the patient with chronic disease management and/or care coordination needs related to: [name] .    LCSW received information today from [place] regarding client CCM status.  Amber had spoken recently with client and client did not want to reschedule phone visit with LCSW. Client told Noreene Larsson that client would continue to work with CCM Nurse Chong Sicilian, RN  Thus, LCSW is removing name of LCSW from Care Team list for client, per client request  Norva Riffle.Anvay Tennis MSW, [place] [employer] representative [facility] Care Management [phone]

## 2022-01-15 NOTE — Chronic Care Management (AMB) (Signed)
  Chronic Care Management Note  01/15/2022 Name: Susan Contreras MRN: [id number] DOB: 1941-01-03  Susan Contreras is a 81 y.o. year old female who is a primary care patient of [name], FNP and is actively engaged with the care management team. I reached out to Susan Contreras by phone today to assist with re-scheduling a follow up visit with the Licensed Clinical Social Worker  Follow up plan: Patient declines further follow up and engagement by LCSW. Appropriate care team members and provider have been notified via electronic communication.   Susan Contreras, [place], [place], Susan Contreras Direct Dial: [phone] [email or web address]: [email or web address]

## 2022-01-15 NOTE — Progress Notes (Signed)
Clinical Summary Ms. Caulder is a 81 y.o.female seen today for follow up of the following medical problems.    1. Chest pain - ER visit 06/2019 with chest pain - trop neg. CXR no acute process. EKG SR, LAFB. Possible anterolateral Qwaves in ER EKG but not seen in 06/17/19 EKG, I think related to lead placement - from ER notes symptoms improved with mylanta and viscous lidocaine.  CAD risk factors: HTN, HL   - history of GERD and PUD - remote cath 20 years she reports was ok.     - - no recent chest pains. No specific SOB/DOE     2. HTN -compliant with meds     3. Hyperlipidemia - 05/2019 TC 265 TG 280 LDL 164  - started on lipitor at that time by pcp  11/2021 TC 210 TG 390 HDL 46 LDL 99   4. Weight gain - 02/2020 wt 154, today 184 lbs - no recent edema - TSH 1.730  5. Generalized fatigue    Past Medical History:  Diagnosis Date   Anxiety    Depression    Diverticulosis    Esophageal stricture    Family hx of colorectal cancer    GERD (gastroesophageal reflux disease)    Helicobacter pylori (H. pylori) 1997   EGD   Hemorrhoids    Hiatal hernia    Hx of adenomatous colonic polyps    Hyperlipemia    Hypertension    PUD (peptic ulcer disease) 1997   EGD   Thyroid disease    Vertigo      Allergies  Allergen Reactions   Biaxin [Clarithromycin]    Cephalexin    Ciprofloxacin    Doxycycline     Headaches   Flagyl [Metronidazole Hcl]    Ketek [Telithromycin]    Pneumovax [Pneumococcal Polysaccharide Vaccine]    Sulfa Antibiotics      Current Outpatient Medications  Medication Sig Dispense Refill   ALPRAZolam (XANAX) 0.25 MG tablet TAKE 1 TABLET TWICE DAILY AS NEEDED FOR ANXIETY 60 tablet 2   amoxicillin-clavulanate (AUGMENTIN) 875-125 MG tablet Take 1 tablet by mouth 2 (two) times daily. 14 tablet 0   atorvastatin (LIPITOR) 20 MG tablet Take 1 tablet (20 mg total) by mouth daily. 90 tablet 1   Chlorphen-PE-Acetaminophen (NOREL AD PO) Take 1  tablet by mouth as needed.      Cholecalciferol (VITAMIN D3) 125 MCG (5000 UT) CAPS Take 1 capsule (5,000 Units total) by mouth daily. 90 capsule 0   famotidine (PEPCID) 40 MG tablet Take 1 tablet (40 mg total) by mouth daily. 90 tablet 1   fluticasone (FLONASE) 50 MCG/ACT nasal spray Place 1 spray into both nostrils daily. 16 g 0   ipratropium (ATROVENT) 0.03 % nasal spray Place 2 sprays into both nostrils every 12 (twelve) hours. 30 mL 12   irbesartan (AVAPRO) 75 MG tablet TAKE ONE TABLET ONCE DAILY 90 tablet 1   levothyroxine (SYNTHROID) 50 MCG tablet Take 1 tablet (50 mcg total) by mouth daily. 90 tablet 1   meclizine (ANTIVERT) 25 MG tablet TAKE ONE TABLET BY MOUTH THREE TIMES DAILY AS NEEDED FOR DIZZINESS 30 tablet 0   pantoprazole (PROTONIX) 20 MG tablet Take 1 tablet (20 mg total) by mouth 2 (two) times daily before a meal. 180 tablet 2   traZODone (DESYREL) 50 MG tablet Take 1 tablet (50 mg total) by mouth at bedtime. 90 tablet 1   No current facility-administered medications for this visit.  Past Surgical History:  Procedure Laterality Date   ABDOMINAL HYSTERECTOMY     BILATERAL OOPHORECTOMY     CHOLECYSTECTOMY       Allergies  Allergen Reactions   Biaxin [Clarithromycin]    Cephalexin    Ciprofloxacin    Doxycycline     Headaches   Flagyl [Metronidazole Hcl]    Ketek [Telithromycin]    Pneumovax [Pneumococcal Polysaccharide Vaccine]    Sulfa Antibiotics       Family History  Problem Relation Age of Onset   Colon cancer Sister    Stroke Mother    Heart disease Mother    Osteoporosis Mother    Parkinson's disease Father    Osteoporosis Father    Ovarian cancer Sister    Esophageal cancer Neg Hx    Stomach cancer Neg Hx    Kidney disease Neg Hx    Liver disease Neg Hx    Diabetes Neg Hx      Social History Ms. Puzio reports that she has never smoked. She has never used smokeless tobacco. Ms. Hensarling reports no history of alcohol use.   Review of  Systems CONSTITUTIONAL: per hpi HEENT: Eyes: No visual loss, blurred vision, double vision or yellow sclerae.No hearing loss, sneezing, congestion, runny nose or sore throat.  SKIN: No rash or itching.  CARDIOVASCULAR: per hpi RESPIRATORY: No shortness of breath, cough or sputum.  GASTROINTESTINAL: No anorexia, nausea, vomiting or diarrhea. No abdominal pain or blood.  GENITOURINARY: No burning on urination, no polyuria NEUROLOGICAL: No headache, dizziness, syncope, paralysis, ataxia, numbness or tingling in the extremities. No change in bowel or bladder control.  MUSCULOSKELETAL: No muscle, back pain, joint pain or stiffness.  LYMPHATICS: No enlarged nodes. No history of splenectomy.  PSYCHIATRIC: No history of depression or anxiety.  ENDOCRINOLOGIC: No reports of sweating, cold or heat intolerance. No polyuria or polydipsia.  Marland Kitchen   Physical Examination Today's Vitals   01/15/22 1119  BP: 126/80  Pulse: 76  SpO2: 97%  Weight: 184 lb (83.5 kg)  Height: '5\' 1"'$  (1.549 m)   Body mass index is 34.77 kg/m.  Gen: resting comfortably, no acute distress HEENT: no scleral icterus, pupils equal round and reactive, no palptable cervical adenopathy,  CV: RRR, no m/r/g no jvd Resp: Clear to auscultation bilaterally GI: abdomen is soft, non-tender, non-distended, normal bowel sounds, no hepatosplenomegaly MSK: extremities are warm, no edema.  Skin: warm, no rash Neuro:  no focal deficits Psych: appropriate affect   Diagnostic Studies     Assessment and Plan  1. Epigastric pain/Chest pain - atypical epigastric pain not consistent with cardiac chest pain, most consistent with GI - no significant chest pains, continue to moniotor.    2. HTN - at goal, continue current meds   3. Hyperlipidemia - at goal, continue current meds  4. Weight gain/generalized fatigue - no specific cardiac symptoms. NO evidence of fluid retention related to weight gain. Normal thyroid. Defer to pcp, this  is not a cardiac issue       Arnoldo Lenis, M.D

## 2022-01-21 ENCOUNTER — Ambulatory Visit: Payer: Self-pay | Admitting: Physician Assistant

## 2022-01-24 ENCOUNTER — Other Ambulatory Visit: Payer: Self-pay | Admitting: Family

## 2022-01-24 DIAGNOSIS — E039 Hypothyroidism, unspecified: Secondary | ICD-10-CM

## 2022-01-28 ENCOUNTER — Ambulatory Visit: Payer: Medicare HMO | Admitting: *Deleted

## 2022-01-28 ENCOUNTER — Telehealth: Payer: Self-pay | Admitting: Family

## 2022-01-28 DIAGNOSIS — E039 Hypothyroidism, unspecified: Secondary | ICD-10-CM

## 2022-01-28 DIAGNOSIS — I10 Essential (primary) hypertension: Secondary | ICD-10-CM

## 2022-01-28 NOTE — Patient Instructions (Signed)
Visit Information  Thank you for taking time to visit with me today. Please don't hesitate to contact me if I can be of assistance to you before our next scheduled telephone appointment.  Following are the goals we discussed today:  Take medications as prescribed   Attend all scheduled provider appointments Perform all self care activities independently  Perform IADL's (shopping, preparing meals, housekeeping, managing finances) independently Call provider office for new concerns or questions  check blood pressure 3 times per week write blood pressure results in a log or diary take blood pressure log to all doctor appointments call doctor for signs and symptoms of high blood pressure Call RN Care Manager as needed [phone] Do not strain to have a bowel movement Increase water intake Increase intake of fruits and vegetables Use miralax or stool softeners if needed Avoid stimulant laxatives Take magnesium supplement to prevent constipation Move carefully and change positions slowly to decrease risk of falls Increase activity level as tolerated with a goal of 150 minutes a week Use monistat (or generic) 3 day ovule for vaginal irritation. Schedule appointment with provider if symptoms worsen or persist or if new symptoms develop  Our next appointment is by telephone on 02/27/22 at 1:00  Please call the care guide team at [phone] if you need to cancel or reschedule your appointment.   If you are experiencing a Mental Health or [place] or need someone to talk to, please call the [facility] [phone] call [phone]   Patient verbalizes understanding of instructions and care plan provided today and agrees to view in [place]. Active MyChart status and patient understanding of how to access instructions and care plan via MyChart confirmed with patient.     Chong Sicilian, BSN, RN-BC Embedded Chronic Care Manager [facility] /  [place] [facility] Direct Dial: [phone]

## 2022-01-28 NOTE — Telephone Encounter (Signed)
Dr Harl Bowie said heart looked good, she does feel tired and sleepy at times - he stopped the chols med for 3 weeks to see if that could make her feel better - she states she feels the same and she is going to start the lipitor back   FYI only

## 2022-01-28 NOTE — Chronic Care Management (AMB) (Signed)
Care Management    RN Visit Note  01/28/2022 Name: Greenly Rarick MRN: [id number] DOB: [birth year]  Subjective: Susan Contreras is a 81 y.o. year old female who is a primary care patient of [name], FNP. The care management team was consulted for assistance with disease management and care coordination needs.    Engaged with patient by telephone for follow up visit in response to provider referral for case management and/or care coordination services.   Consent to Services:   Ms. Stopher was given information about Care Management services today including:  Care Management services includes personalized support from designated clinical staff supervised by her physician, including individualized plan of care and coordination with other care providers 24/7 contact phone numbers for assistance for urgent and routine care needs. The patient may stop case management services at any time by phone call to the office staff.  Patient agreed to services and consent obtained.   Assessment: Review of patient past medical history, allergies, medications, health status, including review of consultants reports, laboratory and other test data, was performed as part of comprehensive evaluation and provision of chronic care management services.   SDOH (Social Determinants of Health) assessments and interventions performed:    Care Plan  Allergies  Allergen Reactions   Biaxin [Clarithromycin]    Cephalexin    Ciprofloxacin    Doxycycline     Headaches   Flagyl [Metronidazole Hcl]    Ketek [Telithromycin]    Pneumovax [Pneumococcal Polysaccharide Vaccine]    Sulfa Antibiotics     Outpatient Encounter Medications as of 01/28/2022  Medication Sig   ALPRAZolam (XANAX) 0.25 MG tablet TAKE 1 TABLET TWICE DAILY AS NEEDED FOR ANXIETY   atorvastatin (LIPITOR) 20 MG tablet Take 1 tablet (20 mg total) by mouth daily. HOLD 01/15/2022   Chlorphen-PE-Acetaminophen (NOREL AD PO) Take 1 tablet by mouth as  needed.    Cholecalciferol (VITAMIN D3) 125 MCG (5000 UT) CAPS Take 1 capsule (5,000 Units total) by mouth daily.   famotidine (PEPCID) 40 MG tablet Take 1 tablet (40 mg total) by mouth daily.   fluticasone (FLONASE) 50 MCG/ACT nasal spray Place 1 spray into both nostrils daily.   ipratropium (ATROVENT) 0.03 % nasal spray Place 2 sprays into both nostrils every 12 (twelve) hours.   irbesartan (AVAPRO) 75 MG tablet TAKE ONE TABLET ONCE DAILY   levothyroxine (SYNTHROID) 50 MCG tablet TAKE ONE TABLET ONCE DAILY   meclizine (ANTIVERT) 25 MG tablet TAKE ONE TABLET BY MOUTH THREE TIMES DAILY AS NEEDED FOR DIZZINESS   pantoprazole (PROTONIX) 20 MG tablet Take 1 tablet (20 mg total) by mouth 2 (two) times daily before a meal.   traZODone (DESYREL) 50 MG tablet Take 1 tablet (50 mg total) by mouth at bedtime.   No facility-administered encounter medications on file as of 01/28/2022.    Patient Active Problem List   Diagnosis Date Noted   Osteopenia 11/04/2021   Chronic kidney disease, stage 3 ([place]) 08/30/2021   Insomnia 05/07/2021   History of colonic polyps 04/26/2020   Benzodiazepine dependence ([place]) 05/19/2018   Controlled substance agreement signed 05/19/2018   Obesity (BMI 30.0-34.9) 20/94/7096   Metabolic syndrome 28/36/6294   GAD (generalized anxiety disorder) 08/14/2015   Allergic rhinitis 08/14/2015   GERD (gastroesophageal reflux disease) 08/14/2015   Hyperlipidemia 08/14/2015   Vitamin D deficiency 08/14/2015   Hypothyroidism 05/29/2014   Cholelithiasis 01/15/2011   Family history of colon cancer 01/15/2011   Hypertension 11/11/2007   ESOPHAGEAL STRICTURE 11/11/2007  Diaphragmatic hernia 11/11/2007   DIVERTICULOSIS OF COLON 11/11/2007    Conditions to be addressed/monitored: HTN, HLD, Anxiety, and vaginal irritation  Care Plan : [facility] Care Plan     Problem: Chronic Disease Management Needs   Priority: High  Onset Date: 07/31/2021     Long-Range Goal: Work with RN Care  Manager Regarding Care Coordination and Care Management Associated with HTN, GAD, GERD, HLD, Hypothyroidism,Depression, CKD   Start Date: 07/31/2021  Expected End Date: 07/31/2022  This Visit's Progress: On track  Recent Progress: On track  Priority: High  Note:   Current Barriers:  Chronic Disease Management support and education needs related to HTN, HLD, and GERD, hypothyroidism, depression, CKD, anxiety Caregiver strain  RNCM Clinical Goal(s):  Patient will continue to work with Consulting civil engineer and/or Social Worker to address care management and care coordination needs related to HTN, HLD, and GERD, hypothyroidism, depression, and anxiety as evidenced by adherence to CM Team Scheduled appointments     through collaboration with Consulting civil engineer, provider, and care team.  Patient will demonstrate improved self health maintenance by keeping all medical appointments  Interventions: 1:1 collaboration with primary care provider regarding development and update of comprehensive plan of care as evidenced by provider attestation and co-signature Inter-disciplinary care team collaboration (see longitudinal plan of care) Evaluation of current treatment plan related to  self management and patient's adherence to plan as established by provider   Acute Vaginal Irritation:  (Status: New goal.) Short Term Goal  Discussed acute onset of vaginal burning/discomfort this morning that improved with use of monistat cream Discussed recent antibiotic use Denies discharge. No dysuria or other urinary symptoms.  Recommended monistat (or generic) 3 day ovule with external cream. Provided directions on use. Encouraged to schedule appointment with provider if symptoms worsen, persist, or if new symptoms develop   Health Maintenance (Status: Goal Met.)  Short Term Goal  Patient interviewed about adult health maintenance status including Colonoscopy, pneumonia Vaccine, Yearly Influenza vaccine, COVID vaccinations,  Mammogram   Advised patient to discuss Pneumonia Vaccine with primary care provider. Recommended Pneumovax after 08/07/21 Documented Historical Immunization accuracy confirmed  Patient provided CDC guideline information about Pneumonia vaccine Previously assisted in scheduling screening mammogram at [facility] on 08/07/21 at 11:30 and sent patient a My Chart Message with a copy of the appointment confirmation and advised to reach out to [facility] directly if she needs to reschedule Patient cancelled appointment Collaborated with [facility] staff to schedule patient on the mobile mammography unit at [facility] for 09/23/21   Hypothyroidism:  (Status: Goal on Track (progressing): YES.) Long Term Goal  Lab Results  Component Value Date   TSH 1.730 12/05/2021  Reviewed and discussed medications and importance of compliance Reviewed and discussed most recent lab results and normal TSH Encouraged patient to f/u with PCP and endocrinologist as recommended and to have TSH rechecked as instructed Discussed continued low energy and fatigue. Also,per patient, a 30 lb weight gain in 2 years. Recent visit with cardiologist was unremarkable. She discussed these symptoms with him and he didn't feel that they were cardiac related. Encouraged to increase activity level as tolerated   Hypertension: (Status: Goal on Track (progressing): YES.) Last practice recorded BP readings:  BP Readings from Last 3 Encounters:  01/15/22 126/80  12/12/21 133/80  10/29/21 127/67   Evaluation of current treatment plan related to hypertension self management and patient's adherence to plan as established by provider;   Reviewed medications with patient and discussed importance of compliance;  Counseled on the importance of exercise goals with target of 150 minutes per week Discussed plans with patient for ongoing care management follow up and provided patient with direct contact information for care management team; Advised patient, providing  education and rationale, to monitor blood pressure daily and record, calling PCP for findings outside established parameters;  Discussed complications of poorly controlled blood pressure such as heart disease, stroke, circulatory complications, vision complications, kidney impairment, sexual dysfunction;  Assessed social determinant of health barriers;  Monitor for s/s of hypotension r/t blood pressure medication   Hyperlipidemia:  (Status: Goal on Track (progressing): YES.) Long Term Goal  Lab Results  Component Value Date   CHOL 210 (H) 12/05/2021   HDL 46 12/05/2021   LDLCALC 99 12/05/2021   TRIG 390 (H) 12/05/2021   CHOLHDL 4.6 (H) 12/05/2021   Medication review performed Provider established cholesterol goals reviewed; Counseled on importance of regular laboratory monitoring as prescribed; Reviewed role and benefits of statin for ASCVD risk reduction; Reviewed importance of limiting foods high in cholesterol; Reviewed exercise goals and target of 150 minutes per week; Basic pathophysiology of hyperlipidemia and hypertriglyceridemia discussed     GERD:  (Status: Condition stable. Not addressed this visit.) Long Term Goal  Evaluation of treatment plan related to GERD and patient's adherence Reviewed and discussed medications and importance of compliance: Prevacid daily and prilosec twice daily Reviewed and discussed most recent GI visit and encouraged to f/u with GI if she continues to have GERD and dysphagia despite treatment with PPI and H2 blocker. Discussed diet and foods that make GERD worse Verbal education provided on ways to improve GERD symptoms   Constipation:  (Status: Goal on Track (progressing): YES.) Short Term Goal  Discussed symptoms of constipation. They have improved. Reviewed and discussed medications and treatments Cautioned against routine use of stimulant laxatives and provided reasoning  Encouraged use of stool softeners and/or miralax for constipation and  magnesium supplement for maintenance  Encouraged increased water intake Discussed role of fiber in constipation prevention and management Verbal education on proper diet provided. Fruits and vegetables encouraged. Whole grains can be helpful if taken with enough water.  Advised to reach out to PCP with any new or worsening symptoms   Falls:  (Status: Condition stable. Not addressed this visit.) Long Term Goal  Reviewed medications and discussed potential side effects of medications such as dizziness and frequent urination Advised patient of importance of notifying provider of falls Assessed for signs and symptoms of orthostatic hypotension Assessed for falls since last encounter Discussed mechanical fall 2 weeks ago on her deck where she tripped over a nail. No head injury and no loss of consciousness. Denies headache or dizziness. Her lip is healing. No additional complaints related to fall. Encouraged to be aware of surroundings and to move carefully and change positions slowly to decrease risk for falls   Chronic Kidney Disease (Status: Condition stable. Not addressed this visit.)  Long Term Goal  Last practice recorded BP readings:  BP Readings from Last 3 Encounters:  07/10/21 (!) 143/78  01/23/21 117/67  08/13/20 123/72  Most recent eGFR/CrCl:  Lab Results  Component Value Date   EGFR 40 (L) 07/10/2021    No components found for: CRCL  Lab Results  Component Value Date   CREATININE 1.37 (H) 07/10/2021   BUN 10 07/10/2021   NA 139 07/10/2021   K 4.4 07/10/2021   CL 99 07/10/2021   CO2 20 07/10/2021   Assessed the patient  understanding of chronic kidney disease    Evaluation of current treatment plan related to chronic kidney disease self management and patient's adherence to plan as established by provider      Reviewed medications with patient and discussed importance of compliance    Counseled on the importance of exercise goals with target of 150 minutes per week      Collaborated with PCP to add CKD stage 3 to problem list based on elevated creatinine and decreased GFR Support coping and stress management by recognizing current strategies and assist in developing new strategies such as mindfulness, journaling, relaxation techniques, problem-solving     Discussed that fatigue can be a symptom of CKD Encouraged patient to increase activity level as tolerated and to rest as needed Reviewed and discussed recent lab results, including CBC. Patient does not appear to have anemia related to CKD Reviewed upcoming appointments Encouraged patient to reach out to PCP with any new or worsening symptoms Therapeutic listening utilized regarding health related anxiety and diagnosis of CKD Discussed OTC vitamins. Patient has restarted vit D and does feel that it's helping her energy level some Questions answered regarding potential b12 deficiency. Reviewed available labs and advised that mots recent b12 levels were normal Recommended a B Complex vitamin if she would like to try it. May help with energy. Specific B12 supplement or injections aren't needed since levels are normal and wouldn't likely be effective   Anxiety:  (Status: Goal on Track (progressing): YES.) Long Term Goal  Therapeutic listening utilized regarding personal and family health related anxiety  Discussed that patient is no longer receiving services from LCSW. She will reach out to him if necessary. Previously recommended that she decrease the amount of news reports that she watches each day and explained that those can increase anxiety in a person that is prone to excessive worrying Encouraged to seek medical attention for any new or worsening symptoms   Patient Goals/Self-Care Activities: Take medications as prescribed   Attend all scheduled provider appointments Perform all self care activities independently  Perform IADL's (shopping, preparing meals, housekeeping, managing finances)  independently Call provider office for new concerns or questions  check blood pressure 3 times per week write blood pressure results in a log or diary take blood pressure log to all doctor appointments call doctor for signs and symptoms of high blood pressure Call RN Care Manager as needed [phone] Do not strain to have a bowel movement Increase water intake Increase intake of fruits and vegetables Use miralax or stool softeners if needed Avoid stimulant laxatives Take magnesium supplement to prevent constipation Move carefully and change positions slowly to decrease risk of falls Increase activity level as tolerated with a goal of 150 minutes a week Use monistat (or generic) 3 day ovule for vaginal irritation. Schedule appointment with provider if symptoms worsen or persist or if new symptoms develop  Plan: Telephone follow up appointment with care management team member scheduled for:  02/27/22 with RNCM The patient has been provided with contact information for the care management team and has been advised to call with any health related questions or concerns.   Chong Sicilian, BSN, RN-BC Embedded Chronic Care Manager [facility] [place] [facility] Direct Dial: [phone]

## 2022-01-29 ENCOUNTER — Encounter: Payer: Self-pay | Admitting: Family Medicine

## 2022-01-29 ENCOUNTER — Ambulatory Visit (INDEPENDENT_AMBULATORY_CARE_PROVIDER_SITE_OTHER): Payer: Medicare HMO | Admitting: Family Medicine

## 2022-01-29 VITALS — BP 114/66 | HR 82 | Temp 98.2°F | Ht 61.0 in | Wt 185.1 lb

## 2022-01-29 DIAGNOSIS — N760 Acute vaginitis: Secondary | ICD-10-CM | POA: Diagnosis not present

## 2022-01-29 DIAGNOSIS — R300 Dysuria: Secondary | ICD-10-CM | POA: Diagnosis not present

## 2022-01-29 LAB — URINALYSIS, ROUTINE W REFLEX MICROSCOPIC
Bilirubin, UA: NEGATIVE
Glucose, UA: NEGATIVE
Ketones, UA: NEGATIVE
Nitrite, UA: NEGATIVE
Protein,UA: NEGATIVE
Specific Gravity, UA: 1.01 (ref 1.005–1.030)
Urobilinogen, Ur: 0.2 mg/dL (ref 0.2–1.0)
pH, UA: 5.5 (ref 5.0–7.5)

## 2022-01-29 LAB — MICROSCOPIC EXAMINATION: Bacteria, UA: NONE SEEN

## 2022-01-29 LAB — WET PREP FOR TRICH, YEAST, CLUE
Clue Cell Exam: NEGATIVE
Trichomonas Exam: NEGATIVE
Yeast Exam: NEGATIVE

## 2022-01-29 MED ORDER — FLUCONAZOLE 150 MG PO TABS: 150.0000 mg | ORAL_TABLET | Freq: Once | ORAL | 0 refills | Status: AC

## 2022-01-29 NOTE — Progress Notes (Signed)
Acute Office Visit  Subjective:     Patient ID: Susan Contreras, female    DOB: [birth year], 81 y.o.   MRN: [id number]  Chief Complaint  Patient presents with   Dysuria    Dysuria   Patient is in today for vaginal irritation x 2 days. She also reports burning. Denies discharge. Denies urgency, frequency, fever, chills, nausea, vomiting, or dysuria. Denies rash. She did use monistat yesterday with some improvement. She was recently on an abx.  Review of Systems  Genitourinary:  Positive for dysuria.  As per HPI.       Objective:    BP 114/66   Pulse 82   Temp 98.2 F (36.8 C) (Temporal)   Ht '5\' 1"'$  (1.549 m)   Wt 185 lb 2 oz (84 kg)   SpO2 100%   BMI 34.98 kg/m    Physical Exam Vitals and nursing note reviewed.  Constitutional:      General: She is not in acute distress.    Appearance: She is not ill-appearing, toxic-appearing or diaphoretic.  Cardiovascular:     Rate and Rhythm: Normal rate and regular rhythm.     Heart sounds: Normal heart sounds. No murmur heard. Pulmonary:     Effort: Pulmonary effort is normal. No respiratory distress.     Breath sounds: Normal breath sounds.  Abdominal:     General: Bowel sounds are normal. There is no distension.     Palpations: Abdomen is soft.     Tenderness: There is no abdominal tenderness. There is no right CVA tenderness, left CVA tenderness, guarding or rebound.  Musculoskeletal:     Right lower leg: No edema.     Left lower leg: No edema.  Skin:    General: Skin is warm and dry.  Neurological:     Mental Status: She is alert and oriented to person, place, and time.    Results for orders placed or performed in visit on 01/29/22  WET PREP FOR [place], YEAST, CLUE   Specimen: Vaginal Fluid   Vaginal Flui  Result Value Ref Range   Trichomonas Exam Negative Negative   Yeast Exam Negative Negative   Clue Cell Exam Negative Negative  Microscopic Examination   Urine  Result Value Ref Range   WBC, UA 0-5 0 - 5  /hpf   RBC 0-2 0 - 2 /hpf   Epithelial Cells (non renal) 0-10 0 - 10 /hpf   Renal Epithel, UA 0-10 (A) None seen /hpf   Bacteria, UA None seen None seen/Few  Urinalysis, Routine w reflex microscopic  Result Value Ref Range   Specific Gravity, UA 1.010 1.005 - 1.030   pH, UA 5.5 5.0 - 7.5   Color, UA Yellow Yellow   Appearance Ur Clear Clear   Leukocytes,UA Trace (A) Negative   Protein,UA Negative Negative/Trace   Glucose, UA Negative Negative   Ketones, UA Negative Negative   RBC, UA 1+ (A) Negative   Bilirubin, UA Negative Negative   Urobilinogen, Ur 0.2 0.2 - 1.0 mg/dL   Nitrite, UA Negative Negative   Microscopic Examination See below:         Assessment & Plan:   Kasiya was seen today for dysuria.  Diagnoses and all orders for this visit:  Acute vaginitis Negative UA and wet prep today. Patient did self swab for wet prep. Diflucan ordered as below based on symptoms and improvement with monistat. Discussed if symptoms do not improve with diflucan, could be due to atrophic vaginitis. Return  to office for new or worsening symptoms, or if symptoms persist.  -     Urinalysis, Routine w reflex microscopic -     WET PREP FOR [place]ICH, YEAST, CLUE -     fluconazole (DIFLUCAN) 150 MG tablet; Take 1 tablet (150 mg total) by mouth once for 1 dose. -     Microscopic Examination   Return in about 5 weeks (around 03/06/2022) for with PCP for chronic follow.  The patient indicates understanding of these issues and agrees with the plan.  Gwenlyn Perking, FNP

## 2022-01-31 ENCOUNTER — Telehealth: Payer: Self-pay | Admitting: Cardiology

## 2022-01-31 NOTE — Telephone Encounter (Signed)
Patient called to let Dr. Harl Bowie know she went back on her cholesterol medication.

## 2022-01-31 NOTE — Telephone Encounter (Signed)
noted 

## 2022-02-18 ENCOUNTER — Encounter: Payer: Self-pay | Admitting: Family Medicine

## 2022-02-18 ENCOUNTER — Ambulatory Visit (INDEPENDENT_AMBULATORY_CARE_PROVIDER_SITE_OTHER): Payer: Medicare HMO | Admitting: Family Medicine

## 2022-02-18 DIAGNOSIS — J019 Acute sinusitis, unspecified: Secondary | ICD-10-CM | POA: Diagnosis not present

## 2022-02-18 MED ORDER — AMOXICILLIN 500 MG PO CAPS: 500.0000 mg | ORAL_CAPSULE | Freq: Two times a day (BID) | ORAL | 0 refills | Status: AC

## 2022-02-26 ENCOUNTER — Encounter: Payer: Self-pay | Admitting: *Deleted

## 2022-02-26 ENCOUNTER — Ambulatory Visit (INDEPENDENT_AMBULATORY_CARE_PROVIDER_SITE_OTHER): Payer: Medicare HMO | Admitting: *Deleted

## 2022-02-26 DIAGNOSIS — E039 Hypothyroidism, unspecified: Secondary | ICD-10-CM

## 2022-02-26 DIAGNOSIS — I10 Essential (primary) hypertension: Secondary | ICD-10-CM

## 2022-02-26 DIAGNOSIS — E785 Hyperlipidemia, unspecified: Secondary | ICD-10-CM

## 2022-02-26 NOTE — Chronic Care Management (AMB) (Signed)
Chronic Care Management   CCM RN Visit Note  02/26/2022 Name: Susan Contreras MRN: [id number] DOB: 1941-03-03  Subjective: Susan Contreras is a 81 y.o. year old female who is a primary care patient of [name], FNP. The care management team was consulted for assistance with disease management and care coordination needs.    Engaged with patient by telephone for follow up visit in response to provider referral for case management and/or care coordination services.   Consent to Services:  The patient was given information about Chronic Care Management services, agreed to services, and gave verbal consent prior to initiation of services.  Please see initial visit note for detailed documentation.   Patient agreed to services and verbal consent obtained.   Assessment: Review of patient past medical history, allergies, medications, health status, including review of consultants reports, laboratory and other test data, was performed as part of comprehensive evaluation and provision of chronic care management services.   SDOH (Social Determinants of Health) assessments and interventions performed:    CCM Care Plan  Allergies  Allergen Reactions   Biaxin [Clarithromycin]    Cephalexin    Ciprofloxacin    Doxycycline     Headaches   Flagyl [Metronidazole Hcl]    Ketek [Telithromycin]    Pneumovax [Pneumococcal Polysaccharide Vaccine]    Sulfa Antibiotics     Outpatient Encounter Medications as of 02/26/2022  Medication Sig   ALPRAZolam (XANAX) 0.25 MG tablet TAKE 1 TABLET TWICE DAILY AS NEEDED FOR ANXIETY   atorvastatin (LIPITOR) 20 MG tablet Take 1 tablet (20 mg total) by mouth daily. HOLD 01/15/2022   Chlorphen-PE-Acetaminophen (NOREL AD PO) Take 1 tablet by mouth as needed.    Cholecalciferol (VITAMIN D3) 125 MCG (5000 UT) CAPS Take 1 capsule (5,000 Units total) by mouth daily.   famotidine (PEPCID) 40 MG tablet Take 1 tablet (40 mg total) by mouth daily.   fluticasone (FLONASE) 50  MCG/ACT nasal spray Place 1 spray into both nostrils daily.   irbesartan (AVAPRO) 75 MG tablet TAKE ONE TABLET ONCE DAILY   levothyroxine (SYNTHROID) 50 MCG tablet TAKE ONE TABLET ONCE DAILY   meclizine (ANTIVERT) 25 MG tablet TAKE ONE TABLET BY MOUTH THREE TIMES DAILY AS NEEDED FOR DIZZINESS   pantoprazole (PROTONIX) 20 MG tablet Take 1 tablet (20 mg total) by mouth 2 (two) times daily before a meal.   traZODone (DESYREL) 50 MG tablet Take 1 tablet (50 mg total) by mouth at bedtime. (Patient not taking: Reported on 01/29/2022)   No facility-administered encounter medications on file as of 02/26/2022.    Patient Active Problem List   Diagnosis Date Noted   Osteopenia 11/04/2021   Chronic kidney disease, stage 3 ([place]) 08/30/2021   Insomnia 05/07/2021   History of colonic polyps 04/26/2020   Benzodiazepine dependence ([place]) 05/19/2018   Controlled substance agreement signed 05/19/2018   Obesity (BMI 30.0-34.9) 09/38/1829   Metabolic syndrome 93/71/6967   GAD (generalized anxiety disorder) 08/14/2015   Allergic rhinitis 08/14/2015   GERD (gastroesophageal reflux disease) 08/14/2015   Hyperlipidemia 08/14/2015   Vitamin D deficiency 08/14/2015   Hypothyroidism 05/29/2014   Cholelithiasis 01/15/2011   Family history of colon cancer 01/15/2011   Hypertension 11/11/2007   ESOPHAGEAL STRICTURE 11/11/2007   Diaphragmatic hernia 11/11/2007   DIVERTICULOSIS OF COLON 11/11/2007    Conditions to be addressed/monitored:HLD, Anxiety, and Hypothyroidism   Care Plan : [facility] Care Plan  Updates made by Ilean China, RN since 02/26/2022 12:00 AM     Problem: Chronic  Disease Management Needs Resolved 02/26/2022  Priority: High  Onset Date: 07/31/2021     Long-Range Goal: Work with RN Care Manager Regarding Care Coordination and Care Management Associated with HTN, GAD, GERD, HLD, Hypothyroidism,Depression, CKD Completed 02/26/2022  Start Date: 07/31/2021  Expected End Date: 07/31/2022  This Visit's  Progress: On track  Recent Progress: On track  Priority: High  Note:   Current Barriers:  Chronic Disease Management support and education needs related to HTN, HLD, and GERD, hypothyroidism, depression, CKD, anxiety Caregiver strain  RNCM Clinical Goal(s):  Patient will continue to work with Consulting civil engineer and/or Social Worker to address care management and care coordination needs related to HTN, HLD, and GERD, hypothyroidism, depression, and anxiety as evidenced by adherence to CM Team Scheduled appointments     through collaboration with Consulting civil engineer, provider, and care team.  Patient will demonstrate improved self health maintenance by keeping all medical appointments  Interventions: 1:1 collaboration with primary care provider regarding development and update of comprehensive plan of care as evidenced by provider attestation and co-signature Inter-disciplinary care team collaboration (see longitudinal plan of care) Evaluation of current treatment plan related to  self management and patient's adherence to plan as established by provider   Acute Vaginal Irritation:  (Status: Goal Met.) Short Term Goal  Discussed acute onset of vaginal burning/discomfort this morning that improved with use of monistat cream Discussed recent antibiotic use Denies discharge. No dysuria or other urinary symptoms.  Recommended monistat (or generic) 3 day ovule with external cream. Provided directions on use. Encouraged to schedule appointment with provider if symptoms worsen, persist, or if new symptoms develop   Health Maintenance (Status: Goal Met.)  Short Term Goal  Patient interviewed about adult health maintenance status including Colonoscopy, pneumonia Vaccine, Yearly Influenza vaccine, COVID vaccinations, Mammogram   Advised patient to discuss Pneumonia Vaccine with primary care provider. Recommended Pneumovax after 08/07/21 Documented Historical Immunization accuracy confirmed  Patient  provided CDC guideline information about Pneumonia vaccine Previously assisted in scheduling screening mammogram at [facility] on 08/07/21 at 11:30 and sent patient a My Chart Message with a copy of the appointment confirmation and advised to reach out to [facility] directly if she needs to reschedule Patient cancelled appointment Collaborated with [facility] staff to schedule patient on the mobile mammography unit at [facility] for 09/23/21   Hypothyroidism:  (Status: Goal on Track (progressing): YES.) Long Term Goal  Lab Results  Component Value Date   TSH 1.730 12/05/2021  Reviewed and discussed medications and importance of compliance Reviewed and discussed most recent lab results and normal TSH Encouraged patient to f/u with PCP and endocrinologist as recommended and to have TSH rechecked as instructed Discussed continued low energy and fatigue. Also,per patient, a 30 lb weight gain in 2 years. Recent visit with cardiologist was unremarkable. She discussed these symptoms with him and he didn't feel that they were cardiac related. Encouraged to increase activity level as tolerated   Hypertension: (Status: Goal on Track (progressing): YES.) Last practice recorded BP readings:  BP Readings from Last 3 Encounters:  01/15/22 126/80  12/12/21 133/80  10/29/21 127/67   Evaluation of current treatment plan related to hypertension self management and patient's adherence to plan as established by provider;   Reviewed medications with patient and discussed importance of compliance;  Counseled on the importance of exercise goals with target of 150 minutes per week Discussed plans with patient for ongoing care management follow up and provided patient with direct contact information for  care management team; Advised patient, providing education and rationale, to monitor blood pressure daily and record, calling PCP for findings outside established parameters;  Discussed complications of poorly controlled blood pressure  such as heart disease, stroke, circulatory complications, vision complications, kidney impairment, sexual dysfunction;  Assessed social determinant of health barriers;  Monitor for s/s of hypotension r/t blood pressure medication   Hyperlipidemia:  (Status: Goal on Track (progressing): YES.) Long Term Goal  Lab Results  Component Value Date   CHOL 210 (H) 12/05/2021   HDL 46 12/05/2021   LDLCALC 99 12/05/2021   TRIG 390 (H) 12/05/2021   CHOLHDL 4.6 (H) 12/05/2021   Medication review performed Provider established cholesterol goals reviewed; Counseled on importance of regular laboratory monitoring as prescribed; Reviewed role and benefits of statin for ASCVD risk reduction; Reviewed importance of limiting foods high in cholesterol; Reviewed exercise goals and target of 150 minutes per week; Basic pathophysiology of hyperlipidemia and hypertriglyceridemia discussed     GERD:  (Status: Condition stable. Not addressed this visit.) Long Term Goal  Evaluation of treatment plan related to GERD and patient's adherence Reviewed and discussed medications and importance of compliance: Prevacid daily and prilosec twice daily Reviewed and discussed most recent GI visit and encouraged to f/u with GI if she continues to have GERD and dysphagia despite treatment with PPI and H2 blocker. Discussed diet and foods that make GERD worse Verbal education provided on ways to improve GERD symptoms   Constipation:  (Status: Goal on Track (progressing): YES.) Short Term Goal  Discussed symptoms of constipation. They have improved. Reviewed and discussed medications and treatments Cautioned against routine use of stimulant laxatives and provided reasoning  Encouraged use of stool softeners and/or miralax for constipation and magnesium supplement for maintenance  Encouraged increased water intake Discussed role of fiber in constipation prevention and management Verbal education on proper diet provided.  Fruits and vegetables encouraged. Whole grains can be helpful if taken with enough water.  Advised to reach out to PCP with any new or worsening symptoms   Falls:  (Status: Condition stable. Not addressed this visit.) Long Term Goal  Reviewed medications and discussed potential side effects of medications such as dizziness and frequent urination Advised patient of importance of notifying provider of falls Assessed for signs and symptoms of orthostatic hypotension Assessed for falls since last encounter Discussed mechanical fall 2 weeks ago on her deck where she tripped over a nail. No head injury and no loss of consciousness. Denies headache or dizziness. Her lip is healing. No additional complaints related to fall. Encouraged to be aware of surroundings and to move carefully and change positions slowly to decrease risk for falls   Chronic Kidney Disease (Status: Condition stable. Not addressed this visit.)  Long Term Goal  Last practice recorded BP readings:  BP Readings from Last 3 Encounters:  07/10/21 (!) 143/78  01/23/21 117/67  08/13/20 123/72  Most recent eGFR/CrCl:  Lab Results  Component Value Date   EGFR 40 (L) 07/10/2021    No components found for: CRCL  Lab Results  Component Value Date   CREATININE 1.37 (H) 07/10/2021   BUN 10 07/10/2021   NA 139 07/10/2021   K 4.4 07/10/2021   CL 99 07/10/2021   CO2 20 07/10/2021   Assessed the patient     understanding of chronic kidney disease    Evaluation of current treatment plan related to chronic kidney disease self management and patient's adherence to plan as established by provider  Reviewed medications with patient and discussed importance of compliance    Counseled on the importance of exercise goals with target of 150 minutes per week     Collaborated with PCP to add CKD stage 3 to problem list based on elevated creatinine and decreased GFR Support coping and stress management by recognizing current strategies and  assist in developing new strategies such as mindfulness, journaling, relaxation techniques, problem-solving     Discussed that fatigue can be a symptom of CKD Encouraged patient to increase activity level as tolerated and to rest as needed Reviewed and discussed recent lab results, including CBC. Patient does not appear to have anemia related to CKD Reviewed upcoming appointments Encouraged patient to reach out to PCP with any new or worsening symptoms Therapeutic listening utilized regarding health related anxiety and diagnosis of CKD Discussed OTC vitamins. Patient has restarted vit D and does feel that it's helping her energy level some Questions answered regarding potential b12 deficiency. Reviewed available labs and advised that mots recent b12 levels were normal Recommended a B Complex vitamin if she would like to try it. May help with energy. Specific B12 supplement or injections aren't needed since levels are normal and wouldn't likely be effective   Anxiety:  (Status: Goal on Track (progressing): YES.) Long Term Goal  Therapeutic listening utilized regarding personal and family health related anxiety  Discussed that patient is no longer receiving services from LCSW. She will reach out to him if necessary. Previously recommended that she decrease the amount of news reports that she watches each day and explained that those can increase anxiety in a person that is prone to excessive worrying Encouraged to seek medical attention for any new or worsening symptoms   Patient Goals/Self-Care Activities: Take medications as prescribed   Attend all scheduled provider appointments Perform all self care activities independently  Perform IADL's (shopping, preparing meals, housekeeping, managing finances) independently Call provider office for new concerns or questions  check blood pressure 3 times per week write blood pressure results in a log or diary take blood pressure log to all doctor  appointments call doctor for signs and symptoms of high blood pressure Do not strain to have a bowel movement Increase water intake Increase intake of fruits and vegetables Use miralax or stool softeners if needed Avoid stimulant laxatives Take magnesium supplement to prevent constipation Move carefully and change positions slowly to decrease risk of falls Increase activity level as tolerated with a goal of 150 minutes a week Call [facility] at [phone] if you need to restart Chronic Care Management services  Plan:No further follow up required: Patient is stable and being discharged from [facility] program at [facility]. Patient is aware that she can talk with PCP about restarting services if they are needed in the future.  Chong Sicilian, BSN, RN-BC Embedded Chronic Care Manager [facility] [place] [facility] Direct Dial: [phone]

## 2022-02-26 NOTE — Patient Instructions (Signed)
Visit Information  Thank you for taking time to visit with me today.  Following are the goals we discussed today:  Take medications as prescribed   Attend all scheduled provider appointments Perform all self care activities independently  Perform IADL's (shopping, preparing meals, housekeeping, managing finances) independently Call provider office for new concerns or questions  check blood pressure 3 times per week write blood pressure results in a log or diary take blood pressure log to all doctor appointments call doctor for signs and symptoms of high blood pressure Do not strain to have a bowel movement Increase water intake Increase intake of fruits and vegetables Use miralax or stool softeners if needed Avoid stimulant laxatives Take magnesium supplement to prevent constipation Move carefully and change positions slowly to decrease risk of falls Increase activity level as tolerated with a goal of 150 minutes a week Call [facility] at [phone] if you need to restart Chronic Care Management services   If you are experiencing a Mental Health or [place] or need someone to talk to, please call the [facility]: [phone] call [phone]   Patient verbalizes understanding of instructions and care plan provided today and agrees to view in [place]. Active MyChart status and patient understanding of how to access instructions and care plan via MyChart confirmed with patient.     Chong Sicilian, BSN, RN-BC Embedded Chronic Care Manager [facility] [place] [facility] Direct Dial: [phone]

## 2022-02-27 ENCOUNTER — Telehealth: Payer: Medicare HMO

## 2022-03-10 ENCOUNTER — Encounter: Payer: Self-pay | Admitting: Nurse Practitioner

## 2022-03-10 ENCOUNTER — Ambulatory Visit (INDEPENDENT_AMBULATORY_CARE_PROVIDER_SITE_OTHER): Payer: Medicare HMO | Admitting: Nurse Practitioner

## 2022-03-10 VITALS — BP 119/83 | HR 80 | Temp 97.3°F | Ht 61.0 in | Wt 183.6 lb

## 2022-03-10 DIAGNOSIS — J0111 Acute recurrent frontal sinusitis: Secondary | ICD-10-CM

## 2022-03-10 MED ORDER — AMOXICILLIN 500 MG PO CAPS: 500.0000 mg | ORAL_CAPSULE | Freq: Two times a day (BID) | ORAL | 0 refills | Status: DC

## 2022-03-10 NOTE — Patient Instructions (Signed)

## 2022-03-10 NOTE — Progress Notes (Signed)
Acute Office Visit  Subjective:     Patient ID: Susan Contreras, female    DOB: [birth year], 81 y.o.   MRN: [id number]  No chief complaint on file.   Sinusitis This is a new problem. The current episode started yesterday. The problem is unchanged. There has been no fever. The pain is mild. Associated symptoms include chills, congestion, headaches and sinus pressure. Pertinent negatives include no coughing, shortness of breath, sneezing, sore throat or swollen glands. Past treatments include nothing.    Review of Systems  Constitutional:  Positive for chills and malaise/fatigue. Negative for fever.  HENT:  Positive for congestion and sinus pressure. Negative for sneezing and sore throat.   Respiratory:  Negative for cough, shortness of breath and wheezing.   Cardiovascular: Negative.   Skin: Negative.  Negative for itching and rash.  Neurological:  Positive for headaches.  All other systems reviewed and are negative.       Objective:    BP 119/83   Pulse 80   Temp (!) 97.3 F (36.3 C)   Ht '5\' 1"'$  (1.549 m)   Wt 183 lb 9.6 oz (83.3 kg)   SpO2 97%   BMI 34.69 kg/m  BP Readings from Last 3 Encounters:  03/10/22 119/83  01/29/22 114/66  01/15/22 126/80   Wt Readings from Last 3 Encounters:  03/10/22 183 lb 9.6 oz (83.3 kg)  01/29/22 185 lb 2 oz (84 kg)  01/15/22 184 lb (83.5 kg)      Physical Exam Vitals and nursing note reviewed.  Constitutional:      Appearance: Normal appearance.  HENT:     Head: Normocephalic.     Right Ear: External ear normal.     Left Ear: External ear normal.     Nose: Congestion present.     Mouth/Throat:     Mouth: Mucous membranes are moist.  Eyes:     Conjunctiva/sclera: Conjunctivae normal.  Cardiovascular:     Rate and Rhythm: Normal rate and regular rhythm.     Pulses: Normal pulses.     Heart sounds: Normal heart sounds.  Abdominal:     General: Bowel sounds are normal.  Musculoskeletal:        General: Normal range of  motion.  Skin:    General: Skin is warm.     Findings: No rash.  Neurological:     General: No focal deficit present.     Mental Status: She is alert and oriented to person, place, and time.  Psychiatric:        Mood and Affect: Mood normal.        Behavior: Behavior normal.     No results found for any visits on 03/10/22.      Assessment & Plan:  Patient parents with frontal sinusitis. Symptoms present in the past few days. Chills and night sweats. [place] in clinic today.  Made patient aware of her medication allergies and to amoxicillin, patient verbalized that she is able to take Amoxil capsules with no problems.   Medication sent to pharmacy.  Take meds as prescribed - Use a cool mist humidifier  -Use saline nose sprays frequently -Force fluids -For fever or aches or pains- take Tylenol or ibuprofen. -If symptoms do not improve, she may need to be COVID tested to rule this out Follow up with worsening unresolved symptoms  Problem List Items Addressed This Visit   None Visit Diagnoses     Acute recurrent frontal sinusitis    -  Primary   Relevant Medications   amoxicillin (AMOXIL) 500 MG capsule       Meds ordered this encounter  Medications   amoxicillin (AMOXIL) 500 MG capsule    Sig: Take 1 capsule (500 mg total) by mouth 2 (two) times daily.    Dispense:  14 capsule    Refill:  0    Order Specific Question:   Supervising Provider    Answer:   Claretta Fraise [[id number]]    Return if symptoms worsen or fail to improve.  Ivy Lynn, NP

## 2022-03-24 DIAGNOSIS — I10 Essential (primary) hypertension: Secondary | ICD-10-CM

## 2022-03-24 DIAGNOSIS — E039 Hypothyroidism, unspecified: Secondary | ICD-10-CM

## 2022-03-24 DIAGNOSIS — E785 Hyperlipidemia, unspecified: Secondary | ICD-10-CM

## 2022-03-25 ENCOUNTER — Ambulatory Visit (INDEPENDENT_AMBULATORY_CARE_PROVIDER_SITE_OTHER): Payer: Medicare HMO | Admitting: Family

## 2022-03-25 ENCOUNTER — Encounter: Payer: Self-pay | Admitting: Family

## 2022-03-25 VITALS — BP 131/65 | HR 80 | Temp 97.3°F | Ht 61.0 in | Wt 184.0 lb

## 2022-03-25 DIAGNOSIS — G4700 Insomnia, unspecified: Secondary | ICD-10-CM

## 2022-03-25 DIAGNOSIS — K219 Gastro-esophageal reflux disease without esophagitis: Secondary | ICD-10-CM | POA: Diagnosis not present

## 2022-03-25 DIAGNOSIS — E039 Hypothyroidism, unspecified: Secondary | ICD-10-CM | POA: Diagnosis not present

## 2022-03-25 DIAGNOSIS — R5383 Other fatigue: Secondary | ICD-10-CM

## 2022-03-25 DIAGNOSIS — E669 Obesity, unspecified: Secondary | ICD-10-CM

## 2022-03-25 DIAGNOSIS — E785 Hyperlipidemia, unspecified: Secondary | ICD-10-CM | POA: Diagnosis not present

## 2022-03-25 DIAGNOSIS — I159 Secondary hypertension, unspecified: Secondary | ICD-10-CM | POA: Diagnosis not present

## 2022-03-25 DIAGNOSIS — Z79899 Other long term (current) drug therapy: Secondary | ICD-10-CM | POA: Diagnosis not present

## 2022-03-25 DIAGNOSIS — F411 Generalized anxiety disorder: Secondary | ICD-10-CM | POA: Diagnosis not present

## 2022-03-25 DIAGNOSIS — F1320 Sedative, hypnotic or anxiolytic dependence, uncomplicated: Secondary | ICD-10-CM

## 2022-03-25 DIAGNOSIS — E559 Vitamin D deficiency, unspecified: Secondary | ICD-10-CM | POA: Diagnosis not present

## 2022-03-25 DIAGNOSIS — Z800 Family history of malignant neoplasm of digestive organs: Secondary | ICD-10-CM | POA: Diagnosis not present

## 2022-03-25 DIAGNOSIS — E66811 Obesity, class 1: Secondary | ICD-10-CM

## 2022-03-25 MED ORDER — ALPRAZOLAM 0.25 MG PO TABS: ORAL_TABLET | ORAL | 2 refills | Status: DC

## 2022-03-25 NOTE — Progress Notes (Signed)
Subjective:    Patient ID: Susan Contreras, female    DOB: 01-May-1941, 81 y.o.   MRN: [id number]   Chief Complaint  Patient presents with   Medical Management of Chronic Issues   Fatigue    Been going on for a long time comes and goes some days wrose than others   Gastroesophageal Reflux    Patients states it has gotten worse    Pt calls the office today for chronic follow up. She is followed by GI as needed. She has CKD and avoid NSAID's.   Complaining of increased fatigue and weakness for the last several moths.  Gastroesophageal Reflux She complains of belching and heartburn. She reports no hoarse voice. This is a chronic problem. The current episode started more than 1 year ago. The problem occurs occasionally. The problem has been waxing and waning. Associated symptoms include fatigue. Risk factors include obesity. She has tried a PPI for the symptoms. The treatment provided moderate relief.  Hypertension This is a chronic problem. The current episode started more than 1 year ago. The problem has been resolved since onset. The problem is controlled. Associated symptoms include anxiety and malaise/fatigue. Pertinent negatives include no peripheral edema or shortness of breath. Risk factors for coronary artery disease include dyslipidemia, obesity and sedentary lifestyle. The current treatment provides moderate improvement. Identifiable causes of hypertension include a thyroid problem.  Thyroid Problem Presents for follow-up visit. Symptoms include anxiety, constipation and fatigue. Patient reports no depressed mood or hoarse voice. The symptoms have been stable. Her past medical history is significant for hyperlipidemia.  Hyperlipidemia This is a chronic problem. The current episode started more than 1 year ago. The problem is controlled. Recent lipid tests were reviewed and are normal. Exacerbating diseases include obesity. Pertinent negatives include no shortness of breath. Current  antihyperlipidemic treatment includes statins. The current treatment provides moderate improvement of lipids. Risk factors for coronary artery disease include dyslipidemia and post-menopausal.  Anxiety Presents for follow-up visit. Symptoms include excessive worry, insomnia, irritability and nervous/anxious behavior. Patient reports no depressed mood or shortness of breath. Symptoms occur most days. The severity of symptoms is moderate.    Insomnia Primary symptoms: difficulty falling asleep, frequent awakening, malaise/fatigue.   The current episode started more than one year. The onset quality is gradual. The problem occurs intermittently.      Review of Systems  Constitutional:  Positive for fatigue, irritability and malaise/fatigue.  HENT:  Negative for hoarse voice.   Respiratory:  Negative for shortness of breath.   Gastrointestinal:  Positive for constipation and heartburn.  Psychiatric/Behavioral:  The patient is nervous/anxious and has insomnia.   All other systems reviewed and are negative.        Objective:   Physical Exam Vitals reviewed.  Constitutional:      General: She is not in acute distress.    Appearance: She is well-developed. She is obese.  HENT:     Head: Normocephalic and atraumatic.     Right Ear: Tympanic membrane normal.     Left Ear: Tympanic membrane normal.  Eyes:     Pupils: Pupils are equal, round, and reactive to light.  Neck:     Thyroid: No thyromegaly.  Cardiovascular:     Rate and Rhythm: Normal rate and regular rhythm.     Heart sounds: Normal heart sounds. No murmur heard. Pulmonary:     Effort: Pulmonary effort is normal. No respiratory distress.     Breath sounds: Normal breath sounds. No wheezing.  Abdominal:     General: Bowel sounds are normal. There is no distension.     Palpations: Abdomen is soft.     Tenderness: There is no abdominal tenderness.  Musculoskeletal:        General: No tenderness. Normal range of motion.      Cervical back: Normal range of motion and neck supple.  Skin:    General: Skin is warm and dry.  Neurological:     Mental Status: She is alert and oriented to person, place, and time.     Cranial Nerves: No cranial nerve deficit.     Deep Tendon Reflexes: Reflexes are normal and symmetric.  Psychiatric:        Behavior: Behavior normal.        Thought Content: Thought content normal.        Judgment: Judgment normal.      BP 131/65   Pulse 80   Temp (!) 97.3 F (36.3 C) (Oral)   Ht 5' 1"  (1.549 m)   Wt 184 lb (83.5 kg)   BMI 34.77 kg/m       Assessment & Plan:  Susan Contreras comes in today with chief complaint of Medical Management of Chronic Issues, Fatigue (Been going on for a long time comes and goes some days wrose than others), and Gastroesophageal Reflux (Patients states it has gotten worse )   Diagnosis and orders addressed:  1. Controlled substance agreement signed - ALPRAZolam (XANAX) 0.25 MG tablet; TAKE 1 TABLET TWICE DAILY AS NEEDED FOR ANXIETY  Dispense: 60 tablet; Refill: 2 - CMP14+EGFR - CBC with Differential/Platelet  2. Benzodiazepine dependence (HCC) - ALPRAZolam (XANAX) 0.25 MG tablet; TAKE 1 TABLET TWICE DAILY AS NEEDED FOR ANXIETY  Dispense: 60 tablet; Refill: 2 - CMP14+EGFR - CBC with Differential/Platelet  3. GAD (generalized anxiety disorder) - ALPRAZolam (XANAX) 0.25 MG tablet; TAKE 1 TABLET TWICE DAILY AS NEEDED FOR ANXIETY  Dispense: 60 tablet; Refill: 2 - CMP14+EGFR - CBC with Differential/Platelet  4. Secondary hypertension  - CMP14+EGFR - CBC with Differential/Platelet  5. Gastroesophageal reflux disease, unspecified whether esophagitis present - CMP14+EGFR - CBC with Differential/Platelet  6. Hypothyroidism, unspecified type - CMP14+EGFR - CBC with Differential/Platelet - TSH  7. Family history of colon cancer - CMP14+EGFR - CBC with Differential/Platelet  8. Hyperlipidemia, unspecified hyperlipidemia type -  CMP14+EGFR - CBC with Differential/Platelet  9. Vitamin D deficiency - CMP14+EGFR - CBC with Differential/Platelet  10. Obesity (BMI 30.0-34.9) - CMP14+EGFR - CBC with Differential/Platelet  11. Insomnia, unspecified type - CMP14+EGFR - CBC with Differential/Platelet  12. Other fatigue - CMP14+EGFR - CBC with Differential/Platelet - TSH - Iron, TIBC and Ferritin Panel - Vitamin B12   Labs pending Patient reviewed in [place] controlled database, no flags noted. Contract and drug screen are up to date.  Health Maintenance reviewed Diet and exercise encouraged  Follow up plan: 3 months    [name], FNP

## 2022-03-25 NOTE — Patient Instructions (Addendum)
Fatigue If you have fatigue, you feel tired all the time and have a lack of energy or a lack of motivation. Fatigue may make it difficult to start or complete tasks because of exhaustion. Occasional or mild fatigue is often a normal response to activity or life. However, long-term (chronic) or extreme fatigue may be a symptom of a medical condition such as: Depression. Not having enough red blood cells or hemoglobin in the blood (anemia). A problem with a small gland located in the lower front part of the neck (thyroid disorder). Rheumatologic conditions. These are problems related to the body's defense system (immune system). Infections, especially certain viral infections. Fatigue can also lead to negative health outcomes over time. Follow these instructions at home: Medicines Take over-the-counter and prescription medicines only as told by your health care provider. Take a multivitamin if told by your health care provider. Do not use herbal or dietary supplements unless they are approved by your health care provider. Eating and drinking  Avoid heavy meals in the evening. Eat a well-balanced diet, which includes lean proteins, whole grains, plenty of fruits and vegetables, and low-fat dairy products. Avoid eating or drinking too many products with caffeine in them. Avoid alcohol. Drink enough fluid to keep your urine pale yellow. Activity  Exercise regularly, as told by your health care provider. Use or practice techniques to help you relax, such as yoga, tai chi, meditation, or massage therapy. Lifestyle Change situations that cause you stress. Try to keep your work and personal schedules in balance. Do not use recreational or illegal drugs. General instructions Monitor your fatigue for any changes. Go to bed and get up at the same time every day. Avoid fatigue by pacing yourself during the day and getting enough sleep at night. Maintain a healthy weight. Contact a health care  provider if: Your fatigue does not get better. You have a fever. You suddenly lose or gain weight. You have headaches. You have trouble falling asleep or sleeping through the night. You feel angry, guilty, anxious, or sad. You have swelling in your legs or another part of your body. Get help right away if: You feel confused, feel like you might faint, or faint. Your vision is blurry or you have a severe headache. You have severe pain in your abdomen, your back, or the area between your waist and hips (pelvis). You have chest pain, shortness of breath, or an irregular or fast heartbeat. You are unable to urinate, or you urinate less than normal. You have abnormal bleeding from the rectum, nose, lungs, nipples, or, if you are female, the vagina. You vomit blood. You have thoughts about hurting yourself or others. These symptoms may be an emergency. Get help right away. Call 911. Do not wait to see if the symptoms will go away. Do not drive yourself to the hospital. Get help right away if you feel like you may hurt yourself or others, or have thoughts about taking your own life. Go to your nearest emergency room or: Call 911. Call the [facility] at [phone] or 988. This is open 24 hours a day. Text the Crisis Text Line at 979-823-0385. Summary If you have fatigue, you feel tired all the time and have a lack of energy or a lack of motivation. Fatigue may make it difficult to start or complete tasks because of exhaustion. Long-term (chronic) or extreme fatigue may be a symptom of a medical condition. Exercise regularly, as told by your health care provider.  Change situations that cause you stress. Try to keep your work and personal schedules in balance. This information is not intended to replace advice given to you by your health care provider. Make sure you discuss any questions you have with your health care provider. Document Revised: 06/03/2021 Document  Reviewed: 06/03/2021 Elsevier Patient Education  2023 Elsevier Inc.  

## 2022-03-26 LAB — CBC WITH DIFFERENTIAL/PLATELET
Basophils Absolute: 0.1 10*3/uL (ref 0.0–0.2)
Basos: 1 %
EOS (ABSOLUTE): 0.3 10*3/uL (ref 0.0–0.4)
Eos: 4 %
Hematocrit: 39.8 % (ref 34.0–46.6)
Hemoglobin: 12.9 g/dL (ref 11.1–15.9)
Immature Grans (Abs): 0 10*3/uL (ref 0.0–0.1)
Immature Granulocytes: 0 %
Lymphocytes Absolute: 3.1 10*3/uL (ref 0.7–3.1)
Lymphs: 38 %
MCH: 28.7 pg (ref 26.6–33.0)
MCHC: 32.4 g/dL (ref 31.5–35.7)
MCV: 89 fL (ref 79–97)
Monocytes Absolute: 0.6 10*3/uL (ref 0.1–0.9)
Monocytes: 7 %
Neutrophils Absolute: 4.1 10*3/uL (ref 1.4–7.0)
Neutrophils: 50 %
Platelets: 298 10*3/uL (ref 150–450)
RBC: 4.49 x10E6/uL (ref 3.77–5.28)
RDW: 13.1 % (ref 11.7–15.4)
WBC: 8.2 10*3/uL (ref 3.4–10.8)

## 2022-03-26 LAB — CMP14+EGFR
ALT: 9 IU/L (ref 0–32)
AST: 17 IU/L (ref 0–40)
Albumin/Globulin Ratio: 1.5 (ref 1.2–2.2)
Albumin: 4.6 g/dL (ref 3.8–4.8)
Alkaline Phosphatase: 86 IU/L (ref 44–121)
BUN/Creatinine Ratio: 8 — ABNORMAL LOW (ref 12–28)
BUN: 13 mg/dL (ref 8–27)
Bilirubin Total: 0.6 mg/dL (ref 0.0–1.2)
CO2: 19 mmol/L — ABNORMAL LOW (ref 20–29)
Calcium: 9.6 mg/dL (ref 8.7–10.3)
Chloride: 100 mmol/L (ref 96–106)
Creatinine, Ser: 1.65 mg/dL — ABNORMAL HIGH (ref 0.57–1.00)
Globulin, Total: 3 g/dL (ref 1.5–4.5)
Glucose: 105 mg/dL — ABNORMAL HIGH (ref 70–99)
Potassium: 4.3 mmol/L (ref 3.5–5.2)
Sodium: 137 mmol/L (ref 134–144)
Total Protein: 7.6 g/dL (ref 6.0–8.5)
eGFR: 31 mL/min/{1.73_m2} — ABNORMAL LOW (ref >59)

## 2022-03-26 LAB — TSH: TSH: 3.01 u[IU]/mL (ref 0.450–4.500)

## 2022-03-26 LAB — IRON,TIBC AND FERRITIN PANEL
Ferritin: 48 ng/mL (ref 15–150)
Iron Saturation: 18 % (ref 15–55)
Iron: 53 ug/dL (ref 27–139)
Total Iron Binding Capacity: 294 ug/dL (ref 250–450)
UIBC: 241 ug/dL (ref 118–369)

## 2022-03-26 LAB — VITAMIN B12: Vitamin B-12: 237 pg/mL (ref 232–1245)

## 2022-03-27 ENCOUNTER — Other Ambulatory Visit: Payer: Self-pay | Admitting: Family

## 2022-03-27 DIAGNOSIS — R7989 Other specified abnormal findings of blood chemistry: Secondary | ICD-10-CM

## 2022-04-01 ENCOUNTER — Telehealth: Payer: Self-pay | Admitting: Family

## 2022-04-01 NOTE — Telephone Encounter (Signed)
Patient is feeling weak. Had appt on 8/1, said she was feeling bad then but has continued to get worse. Wants to know if she should come back in for appt or if she needs to get labs to recheck her levels. Please call back and advise.

## 2022-04-01 NOTE — Telephone Encounter (Signed)
Patient aware and verbalized understanding. °

## 2022-04-01 NOTE — Telephone Encounter (Signed)
No other labs I can think of.

## 2022-04-17 ENCOUNTER — Encounter: Payer: Self-pay | Admitting: Family

## 2022-04-17 ENCOUNTER — Ambulatory Visit (INDEPENDENT_AMBULATORY_CARE_PROVIDER_SITE_OTHER): Payer: Medicare HMO | Admitting: Family

## 2022-04-17 VITALS — BP 132/87 | HR 92 | Temp 97.8°F | Ht 61.0 in | Wt 185.0 lb

## 2022-04-17 DIAGNOSIS — N1832 Chronic kidney disease, stage 3b: Secondary | ICD-10-CM

## 2022-04-17 DIAGNOSIS — J069 Acute upper respiratory infection, unspecified: Secondary | ICD-10-CM | POA: Diagnosis not present

## 2022-04-17 DIAGNOSIS — F411 Generalized anxiety disorder: Secondary | ICD-10-CM

## 2022-04-17 DIAGNOSIS — J3089 Other allergic rhinitis: Secondary | ICD-10-CM | POA: Diagnosis not present

## 2022-04-17 MED ORDER — FLUTICASONE PROPIONATE 50 MCG/ACT NA SUSP: 1.0000 | Freq: Every day | NASAL | 0 refills | Status: DC

## 2022-04-17 MED ORDER — CETIRIZINE HCL 10 MG PO TABS: 10.0000 mg | ORAL_TABLET | Freq: Every day | ORAL | 1 refills | Status: DC

## 2022-04-17 NOTE — Patient Instructions (Signed)

## 2022-04-17 NOTE — Progress Notes (Signed)
Subjective:    Patient ID: Susan Contreras, female    DOB: [birth year], 81 y.o.   MRN: [id number]  Chief Complaint  Patient presents with   Medical Management of Chronic Issues   Pt presents to the office today with ear pain and sinus issues that started yesterday.   She reports she is under a great deal of stress with her husband. States he is having memory issues and anger issues.   Her creatinine was elevated on last visit. She has hx of CKD.  Sinusitis This is a new problem. The current episode started yesterday. The problem has been gradually improving since onset. There has been no fever. The pain is mild. Associated symptoms include congestion, coughing, ear pain, headaches, sinus pressure (improving), sneezing and a sore throat. Past treatments include saline nose sprays. The treatment provided mild relief.      Review of Systems  HENT:  Positive for congestion, ear pain, sinus pressure (improving), sneezing and sore throat.   Respiratory:  Positive for cough.   Neurological:  Positive for headaches.  All other systems reviewed and are negative.      Objective:   Physical Exam Vitals reviewed.  Constitutional:      General: She is not in acute distress.    Appearance: She is well-developed.  HENT:     Head: Normocephalic and atraumatic.     Right Ear: Tympanic membrane normal.     Left Ear: Tympanic membrane normal.     Mouth/Throat:     Pharynx: Pharyngeal swelling and posterior oropharyngeal erythema present.  Eyes:     Pupils: Pupils are equal, round, and reactive to light.  Neck:     Thyroid: No thyromegaly.  Cardiovascular:     Rate and Rhythm: Normal rate and regular rhythm.     Heart sounds: Normal heart sounds. No murmur heard. Pulmonary:     Effort: Pulmonary effort is normal. No respiratory distress.     Breath sounds: Normal breath sounds. No wheezing.  Abdominal:     General: Bowel sounds are normal. There is no distension.     Palpations: Abdomen  is soft.     Tenderness: There is no abdominal tenderness.  Musculoskeletal:        General: No tenderness. Normal range of motion.     Cervical back: Normal range of motion and neck supple.  Skin:    General: Skin is warm and dry.  Neurological:     Mental Status: She is alert and oriented to person, place, and time.     Cranial Nerves: No cranial nerve deficit.     Deep Tendon Reflexes: Reflexes are normal and symmetric.  Psychiatric:        Behavior: Behavior normal.        Thought Content: Thought content normal.        Judgment: Judgment normal.       BP 132/87   Pulse 92   Temp 97.8 F (36.6 C) (Temporal)   Ht 5' 1" (1.549 m)   Wt 185 lb (83.9 kg)   BMI 34.96 kg/m      Assessment & Plan:  [name] comes in today with chief complaint of Medical Management of Chronic Issues   Diagnosis and orders addressed:  1. Allergic rhinitis due to other allergic trigger, unspecified seasonality Start zyrtec and flonase Avoid allergens  - fluticasone (FLONASE) 50 MCG/ACT nasal spray; Place 1 spray into both nostrils daily.  Dispense: 16 g; Refill: 0 - cetirizine (ZYRTEC)  10 MG tablet; Take 1 tablet (10 mg total) by mouth daily.  Dispense: 90 tablet; Refill: 1 - BMP8+EGFR  2. Viral URI - Take meds as prescribed - Use a cool mist humidifier  -Use saline nose sprays frequently -Force fluids -For any cough or congestion  Use plain Mucinex- regular strength or max strength is fine -For fever or aces or pains- take tylenol or ibuprofen. -Throat lozenges if help - BMP8+EGFR  3. GAD (generalized anxiety disorder) - BMP8+EGFR  4. Stage 3b chronic kidney disease (HCC) Avoid NSAID's - BMP8+EGFR   Labs pending Health Maintenance reviewed Diet and exercise encouraged  Follow up plan: Keep chronic follow up    , FNP    

## 2022-04-18 LAB — BMP8+EGFR
BUN/Creatinine Ratio: 7 — ABNORMAL LOW (ref 12–28)
BUN: 10 mg/dL (ref 8–27)
CO2: 20 mmol/L (ref 20–29)
Calcium: 9.3 mg/dL (ref 8.7–10.3)
Chloride: 102 mmol/L (ref 96–106)
Creatinine, Ser: 1.41 mg/dL — ABNORMAL HIGH (ref 0.57–1.00)
Glucose: 89 mg/dL (ref 70–99)
Potassium: 4.2 mmol/L (ref 3.5–5.2)
Sodium: 139 mmol/L (ref 134–144)
eGFR: 38 mL/min/{1.73_m2} — ABNORMAL LOW (ref >59)

## 2022-04-21 ENCOUNTER — Telehealth: Payer: Self-pay | Admitting: Family

## 2022-04-21 MED ORDER — AMOXICILLIN 875 MG PO TABS: 875.0000 mg | ORAL_TABLET | Freq: Two times a day (BID) | ORAL | 0 refills | Status: DC

## 2022-04-21 NOTE — Telephone Encounter (Signed)
NA

## 2022-04-21 NOTE — Telephone Encounter (Signed)
Amoxicillin Prescription sent to pharmacy.

## 2022-04-23 ENCOUNTER — Other Ambulatory Visit: Payer: Self-pay | Admitting: Family Medicine

## 2022-04-23 ENCOUNTER — Telehealth: Payer: Self-pay | Admitting: Family

## 2022-04-23 MED ORDER — AMOXICILLIN 500 MG PO TABS: 500.0000 mg | ORAL_TABLET | Freq: Two times a day (BID) | ORAL | 0 refills | Status: AC

## 2022-04-23 NOTE — Telephone Encounter (Signed)
That's highly unlikely its too strong, as this is a therapeutic dose.  I've sent the '500mg'$  in nevertheless.

## 2022-04-23 NOTE — Telephone Encounter (Signed)
Patient calling to let us know that the amoxicillin (AMOXIL) 875 MG tablet that was called in on 8/24 was too strong and made her have headaches and made her stomach upset. She would like something else called in.

## 2022-04-23 NOTE — Telephone Encounter (Signed)
Patient notified and verbalized understanding. 

## 2022-05-07 ENCOUNTER — Telehealth: Payer: Self-pay | Admitting: Family

## 2022-05-09 ENCOUNTER — Other Ambulatory Visit: Payer: Self-pay | Admitting: Family Medicine

## 2022-05-09 MED ORDER — MECLIZINE HCL 25 MG PO TABS: ORAL_TABLET | ORAL | 1 refills | Status: DC

## 2022-05-09 NOTE — Telephone Encounter (Signed)
Pt says that she was in the office 04/18/2022 and is asking why she did not get a refill on this medication. She needs it for her allergies. Use [employer].

## 2022-05-09 NOTE — Addendum Note (Signed)
Addended by: [name] A on: 05/09/2022 12:08 PM   Modules accepted: Orders

## 2022-05-09 NOTE — Telephone Encounter (Signed)
Antivert Prescription sent to pharmacy

## 2022-05-09 NOTE — Telephone Encounter (Signed)
Patient aware and verbalized understanding. °

## 2022-06-18 ENCOUNTER — Other Ambulatory Visit: Payer: Self-pay | Admitting: Family

## 2022-06-18 DIAGNOSIS — I10 Essential (primary) hypertension: Secondary | ICD-10-CM

## 2022-06-18 DIAGNOSIS — E785 Hyperlipidemia, unspecified: Secondary | ICD-10-CM

## 2022-06-27 ENCOUNTER — Other Ambulatory Visit: Payer: Self-pay | Admitting: Family

## 2022-06-27 DIAGNOSIS — E785 Hyperlipidemia, unspecified: Secondary | ICD-10-CM

## 2022-06-30 MED ORDER — ATORVASTATIN CALCIUM 20 MG PO TABS: 20.0000 mg | ORAL_TABLET | Freq: Every day | ORAL | 1 refills | Status: DC

## 2022-06-30 NOTE — Telephone Encounter (Signed)
Pt said that Dr Harl Bowie did not prescribed this rx. She got upset and mad when I told her this. Please call back

## 2022-06-30 NOTE — Telephone Encounter (Signed)
NA/NVM refill sent to pharmacy

## 2022-06-30 NOTE — Addendum Note (Signed)
Addended by: Antonietta Barcelona D on: 06/30/2022 02:46 PM   Modules accepted: Orders

## 2022-06-30 NOTE — Telephone Encounter (Signed)
I denied on 06/18/22, and wrote in comments Managed by Dr. Harl Bowie since last refill on 01/15/22 was by him. Please advise on refill. Pt's next OV with you is 09/25/22

## 2022-07-02 MED ORDER — ATORVASTATIN CALCIUM 20 MG PO TABS: 20.0000 mg | ORAL_TABLET | Freq: Every day | ORAL | 1 refills | Status: DC

## 2022-07-02 NOTE — Addendum Note (Signed)
Addended by: Antonietta Barcelona D on: 07/02/2022 08:21 AM   Modules accepted: Orders

## 2022-07-02 NOTE — Telephone Encounter (Signed)
Resent Atorvastatin, was set to No Print on 06/27/22

## 2022-07-14 ENCOUNTER — Encounter (INDEPENDENT_AMBULATORY_CARE_PROVIDER_SITE_OTHER): Payer: Self-pay | Admitting: Gastroenterology

## 2022-07-23 DIAGNOSIS — H40013 Open angle with borderline findings, low risk, bilateral: Secondary | ICD-10-CM | POA: Diagnosis not present

## 2022-07-23 DIAGNOSIS — H02834 Dermatochalasis of left upper eyelid: Secondary | ICD-10-CM | POA: Diagnosis not present

## 2022-07-23 DIAGNOSIS — H02831 Dermatochalasis of right upper eyelid: Secondary | ICD-10-CM | POA: Diagnosis not present

## 2022-07-23 DIAGNOSIS — H2513 Age-related nuclear cataract, bilateral: Secondary | ICD-10-CM | POA: Diagnosis not present

## 2022-08-28 ENCOUNTER — Other Ambulatory Visit: Payer: Self-pay | Admitting: Family

## 2022-08-28 DIAGNOSIS — E039 Hypothyroidism, unspecified: Secondary | ICD-10-CM

## 2022-09-17 ENCOUNTER — Other Ambulatory Visit: Payer: Self-pay | Admitting: Family

## 2022-09-17 DIAGNOSIS — K219 Gastro-esophageal reflux disease without esophagitis: Secondary | ICD-10-CM

## 2022-09-25 ENCOUNTER — Ambulatory Visit (INDEPENDENT_AMBULATORY_CARE_PROVIDER_SITE_OTHER): Payer: Medicare HMO | Admitting: Family

## 2022-09-25 ENCOUNTER — Encounter: Payer: Self-pay | Admitting: Family

## 2022-09-25 VITALS — BP 125/73 | HR 74 | Temp 96.9°F | Ht 61.0 in | Wt 186.2 lb

## 2022-09-25 DIAGNOSIS — F1320 Sedative, hypnotic or anxiolytic dependence, uncomplicated: Secondary | ICD-10-CM

## 2022-09-25 DIAGNOSIS — Z0000 Encounter for general adult medical examination without abnormal findings: Secondary | ICD-10-CM

## 2022-09-25 DIAGNOSIS — F411 Generalized anxiety disorder: Secondary | ICD-10-CM | POA: Diagnosis not present

## 2022-09-25 DIAGNOSIS — Z0001 Encounter for general adult medical examination with abnormal findings: Secondary | ICD-10-CM

## 2022-09-25 DIAGNOSIS — N1832 Chronic kidney disease, stage 3b: Secondary | ICD-10-CM

## 2022-09-25 DIAGNOSIS — Z6835 Body mass index (BMI) 35.0-35.9, adult: Secondary | ICD-10-CM

## 2022-09-25 DIAGNOSIS — E039 Hypothyroidism, unspecified: Secondary | ICD-10-CM

## 2022-09-25 DIAGNOSIS — E559 Vitamin D deficiency, unspecified: Secondary | ICD-10-CM | POA: Diagnosis not present

## 2022-09-25 DIAGNOSIS — Z79899 Other long term (current) drug therapy: Secondary | ICD-10-CM

## 2022-09-25 DIAGNOSIS — I129 Hypertensive chronic kidney disease with stage 1 through stage 4 chronic kidney disease, or unspecified chronic kidney disease: Secondary | ICD-10-CM

## 2022-09-25 DIAGNOSIS — K219 Gastro-esophageal reflux disease without esophagitis: Secondary | ICD-10-CM

## 2022-09-25 DIAGNOSIS — I159 Secondary hypertension, unspecified: Secondary | ICD-10-CM

## 2022-09-25 DIAGNOSIS — E785 Hyperlipidemia, unspecified: Secondary | ICD-10-CM

## 2022-09-25 DIAGNOSIS — J0101 Acute recurrent maxillary sinusitis: Secondary | ICD-10-CM

## 2022-09-25 DIAGNOSIS — G4700 Insomnia, unspecified: Secondary | ICD-10-CM | POA: Diagnosis not present

## 2022-09-25 MED ORDER — AMOXICILLIN 500 MG PO CAPS: 500.0000 mg | ORAL_CAPSULE | Freq: Two times a day (BID) | ORAL | 0 refills | Status: AC

## 2022-09-25 MED ORDER — ALPRAZOLAM 0.25 MG PO TABS: ORAL_TABLET | ORAL | 2 refills | Status: DC

## 2022-09-25 NOTE — Patient Instructions (Signed)
Health Maintenance After Age 80 After age 80, you are at a higher risk for certain long-term diseases and infections as well as injuries from falls. Falls are a major cause of broken bones and head injuries in people who are older than age 80. Getting regular preventive care can help to keep you healthy and well. Preventive care includes getting regular testing and making lifestyle changes as recommended by your health care provider. Talk with your health care provider about: Which screenings and tests you should have. A screening is a test that checks for a disease when you have no symptoms. A diet and exercise plan that is right for you. What should I know about screenings and tests to prevent falls? Screening and testing are the best ways to find a health problem early. Early diagnosis and treatment give you the best chance of managing medical conditions that are common after age 80. Certain conditions and lifestyle choices may make you more likely to have a fall. Your health care provider may recommend: Regular vision checks. Poor vision and conditions such as cataracts can make you more likely to have a fall. If you wear glasses, make sure to get your prescription updated if your vision changes. Medicine review. Work with your health care provider to regularly review all of the medicines you are taking, including over-the-counter medicines. Ask your health care provider about any side effects that may make you more likely to have a fall. Tell your health care provider if any medicines that you take make you feel dizzy or sleepy. Strength and balance checks. Your health care provider may recommend certain tests to check your strength and balance while standing, walking, or changing positions. Foot health exam. Foot pain and numbness, as well as not wearing proper footwear, can make you more likely to have a fall. Screenings, including: Osteoporosis screening. Osteoporosis is a condition that causes  the bones to get weaker and break more easily. Blood pressure screening. Blood pressure changes and medicines to control blood pressure can make you feel dizzy. Depression screening. You may be more likely to have a fall if you have a fear of falling, feel depressed, or feel unable to do activities that you used to do. Alcohol use screening. Using too much alcohol can affect your balance and may make you more likely to have a fall. Follow these instructions at home: Lifestyle Do not drink alcohol if: Your health care provider tells you not to drink. If you drink alcohol: Limit how much you have to: 0-1 drink a day for women. 0-2 drinks a day for men. Know how much alcohol is in your drink. In the [place] one drink equals one 12 oz bottle of beer (355 mL), one 5 oz glass of wine (148 mL), or one 1 oz glass of hard liquor (44 mL). Do not use any products that contain nicotine or tobacco. These products include cigarettes, chewing tobacco, and vaping devices, such as e-cigarettes. If you need help quitting, ask your health care provider. Activity  Follow a regular exercise program to stay fit. This will help you maintain your balance. Ask your health care provider what types of exercise are appropriate for you. If you need a cane or walker, use it as recommended by your health care provider. Wear supportive shoes that have nonskid soles. Safety  Remove any tripping hazards, such as rugs, cords, and clutter. Install safety equipment such as grab bars in bathrooms and safety rails on stairs. Keep rooms and walkways   well-lit. General instructions Talk with your health care provider about your risks for falling. Tell your health care provider if: You fall. Be sure to tell your health care provider about all falls, even ones that seem minor. You feel dizzy, tiredness (fatigue), or off-balance. Take over-the-counter and prescription medicines only as told by your health care provider. These include  supplements. Eat a healthy diet and maintain a healthy weight. A healthy diet includes low-fat dairy products, low-fat (lean) meats, and fiber from whole grains, beans, and lots of fruits and vegetables. Stay current with your vaccines. Schedule regular health, dental, and eye exams. Summary Having a healthy lifestyle and getting preventive care can help to protect your health and wellness after age 80. Screening and testing are the best way to find a health problem early and help you avoid having a fall. Early diagnosis and treatment give you the best chance for managing medical conditions that are more common for people who are older than age 80. Falls are a major cause of broken bones and head injuries in people who are older than age 80. Take precautions to prevent a fall at home. Work with your health care provider to learn what changes you can make to improve your health and wellness and to prevent falls. This information is not intended to replace advice given to you by your health care provider. Make sure you discuss any questions you have with your health care provider. Document Revised: 12/31/2020 Document Reviewed: 12/31/2020 Elsevier Patient Education  2023 [employer] Inc.  

## 2022-09-25 NOTE — Progress Notes (Signed)
Subjective:    Patient ID: Susan Contreras, female    DOB: 1941-07-09, 82 y.o.   MRN: [id number]  Chief Complaint  Patient presents with   Medical Management of Chronic Issues   Headache   Nasal Congestion    X 1 week. Took old rx of amoxicillin for 4 days    Pt calls the office today for CPE and chronic follow up. She is followed by GI as needed. She has CKD and avoid NSAID's.   She is morbid obese with a BMI of 35 with HTN and GERD.    Complaining of sinus congestion and headache. Has been taking amoxicillin.   Headache  Associated symptoms include coughing, insomnia, nausea, rhinorrhea and a sore throat. Pertinent negatives include no ear pain. Her past medical history is significant for hypertension and obesity.  Hypertension This is a chronic problem. The current episode started more than 1 year ago. The problem has been resolved since onset. The problem is controlled. Associated symptoms include anxiety, headaches and malaise/fatigue. Pertinent negatives include no peripheral edema or shortness of breath. Risk factors for coronary artery disease include dyslipidemia, obesity and sedentary lifestyle. The current treatment provides moderate improvement. There is no history of CVA or heart failure. Identifiable causes of hypertension include a thyroid problem.  Gastroesophageal Reflux She complains of belching, coughing, heartburn, nausea and a sore throat. This is a chronic problem. The current episode started more than 1 year ago. The problem occurs frequently. Associated symptoms include fatigue. She has tried a PPI for the symptoms. The treatment provided mild relief.  Thyroid Problem Presents for follow-up visit. Symptoms include anxiety, constipation and fatigue. Patient reports no diarrhea. The symptoms have been stable. Her past medical history is significant for hyperlipidemia. There is no history of heart failure.  Insomnia Primary symptoms: difficulty falling asleep, frequent  awakening, malaise/fatigue.   The current episode started more than one year. The onset quality is gradual. The problem occurs intermittently. Past treatments include medication. The treatment provided moderate relief.  Hyperlipidemia This is a chronic problem. The current episode started more than 1 year ago. The problem is uncontrolled. Recent lipid tests were reviewed and are normal. Exacerbating diseases include obesity. Pertinent negatives include no shortness of breath. Current antihyperlipidemic treatment includes statins. The current treatment provides moderate improvement of lipids. Risk factors for coronary artery disease include dyslipidemia, hypertension, a sedentary lifestyle and post-menopausal.  Anxiety Presents for follow-up visit. Symptoms include excessive worry, insomnia, nausea and nervous/anxious behavior. Patient reports no shortness of breath. Symptoms occur occasionally. The severity of symptoms is moderate.    URI  This is a new problem. The current episode started 1 to 4 weeks ago. The problem has been waxing and waning. There has been no fever. Associated symptoms include congestion, coughing, headaches, nausea, rhinorrhea, sinus pain and a sore throat. Pertinent negatives include no diarrhea or ear pain. She has tried antihistamine and acetaminophen (amoxicillin) for the symptoms.      Review of Systems  Constitutional:  Positive for fatigue and malaise/fatigue.  HENT:  Positive for congestion, rhinorrhea, sinus pain and sore throat. Negative for ear pain.   Respiratory:  Positive for cough. Negative for shortness of breath.   Gastrointestinal:  Positive for constipation, heartburn and nausea. Negative for diarrhea.  Neurological:  Positive for headaches.  Psychiatric/Behavioral:  The patient is nervous/anxious and has insomnia.   All other systems reviewed and are negative.  Family History  Problem Relation Age of Onset   Colon cancer  Sister    Stroke Mother     Heart disease Mother    Osteoporosis Mother    Parkinson's disease Father    Osteoporosis Father    Ovarian cancer Sister    Esophageal cancer Neg Hx    Stomach cancer Neg Hx    Kidney disease Neg Hx    Liver disease Neg Hx    Diabetes Neg Hx    Social History   Socioeconomic History   Marital status: Married    Spouse name: Not on file   Number of children: 2   Years of education: Not on file   Highest education level: Not on file  Occupational History   Occupation: Retired  Tobacco Use   Smoking status: Never   Smokeless tobacco: Never  Vaping Use   Vaping Use: Never used  Substance and Sexual Activity   Alcohol use: No   Drug use: No   Sexual activity: Not on file  Other Topics Concern   Not on file  Social History Narrative   2 caffeine drinks daily    Social Determinants of Health   Financial Resource Strain: Not on file  Food Insecurity: Not on file  Transportation Needs: No Transportation Needs (12/05/2019)   PRAPARE - Hydrologist (Medical): No    Lack of Transportation (Non-Medical): No  Physical Activity: Not on file  Stress: Stress Concern Present (11/19/2021)   [place]    Feeling of Stress : To some extent  Social Connections: Not on file       Objective:   Physical Exam Vitals reviewed.  Constitutional:      General: She is not in acute distress.    Appearance: She is well-developed. She is obese.  HENT:     Head: Normocephalic and atraumatic.     Right Ear: External ear normal.     Nose:     Right Sinus: Maxillary sinus tenderness and frontal sinus tenderness present.     Left Sinus: Maxillary sinus tenderness and frontal sinus tenderness present.     Mouth/Throat:     Pharynx: Posterior oropharyngeal erythema present.  Eyes:     Pupils: Pupils are equal, round, and reactive to light.  Neck:     Thyroid: No thyromegaly.  Cardiovascular:      Rate and Rhythm: Normal rate and regular rhythm.     Heart sounds: Normal heart sounds. No murmur heard. Pulmonary:     Effort: Pulmonary effort is normal. No respiratory distress.     Breath sounds: Normal breath sounds. No wheezing.  Abdominal:     General: Bowel sounds are normal. There is no distension.     Palpations: Abdomen is soft.     Tenderness: There is no abdominal tenderness.  Musculoskeletal:        General: No tenderness. Normal range of motion.     Cervical back: Normal range of motion and neck supple.  Skin:    General: Skin is warm and dry.  Neurological:     Mental Status: She is alert and oriented to person, place, and time.     Cranial Nerves: No cranial nerve deficit.     Deep Tendon Reflexes: Reflexes are normal and symmetric.  Psychiatric:        Behavior: Behavior normal.        Thought Content: Thought content normal.        Judgment: Judgment normal.  BP 125/73   Pulse 74   Temp (!) 96.9 F (36.1 C) (Temporal)   Ht '5\' 1"'$  (1.549 m)   Wt 186 lb 3.2 oz (84.5 kg)   SpO2 98%   BMI 35.18 kg/m      Assessment & Plan:  Susan Contreras comes in today with chief complaint of Medical Management of Chronic Issues, Headache, and Nasal Congestion (X 1 week. Took old rx of amoxicillin for 4 days )   Diagnosis and orders addressed:  1. GAD (generalized anxiety disorder) - ALPRAZolam (XANAX) 0.25 MG tablet; TAKE 1 TABLET TWICE DAILY AS NEEDED FOR ANXIETY  Dispense: 60 tablet; Refill: 2 - CMP14+EGFR - CBC with Differential/Platelet - amoxicillin (AMOXIL) 500 MG capsule; Take 1 capsule (500 mg total) by mouth 2 (two) times daily for 10 days.  Dispense: 20 capsule; Refill: 0  2. Benzodiazepine dependence (HCC) - ALPRAZolam (XANAX) 0.25 MG tablet; TAKE 1 TABLET TWICE DAILY AS NEEDED FOR ANXIETY  Dispense: 60 tablet; Refill: 2 - CMP14+EGFR - CBC with Differential/Platelet - amoxicillin (AMOXIL) 500 MG capsule; Take 1 capsule (500 mg total) by mouth 2 (two)  times daily for 10 days.  Dispense: 20 capsule; Refill: 0  3. Controlled substance agreement signed - ALPRAZolam (XANAX) 0.25 MG tablet; TAKE 1 TABLET TWICE DAILY AS NEEDED FOR ANXIETY  Dispense: 60 tablet; Refill: 2 - CMP14+EGFR - CBC with Differential/Platelet - amoxicillin (AMOXIL) 500 MG capsule; Take 1 capsule (500 mg total) by mouth 2 (two) times daily for 10 days.  Dispense: 20 capsule; Refill: 0  4. Stage 3b chronic kidney disease ([place]) - CMP14+EGFR - CBC with Differential/Platelet  5. Annual physical exam - CMP14+EGFR - CBC with Differential/Platelet - ToxASSURE Select 13 (MW), Urine - amoxicillin (AMOXIL) 500 MG capsule; Take 1 capsule (500 mg total) by mouth 2 (two) times daily for 10 days.  Dispense: 20 capsule; Refill: 0 - Lipid panel - TSH  6. Vitamin D deficiency - CMP14+EGFR - CBC with Differential/Platelet  7. Morbid obesity ([place]) - CMP14+EGFR - CBC with Differential/Platelet  8. Insomnia, unspecified type - CMP14+EGFR - CBC with Differential/Platelet  9. Hypothyroidism, unspecified type - CMP14+EGFR - CBC with Differential/Platelet - TSH  10. Secondary hypertension - CMP14+EGFR - CBC with Differential/Platelet  11. Hyperlipidemia, unspecified hyperlipidemia type - CMP14+EGFR - CBC with Differential/Platelet - Lipid panel  12. Gastroesophageal reflux disease, unspecified whether esophagitis present - CMP14+EGFR - CBC with Differential/Platelet  13. Acute recurrent maxillary sinusitis - Take meds as prescribed - Use a cool mist humidifier  -Use saline nose sprays frequently -Force fluids -For any cough or congestion  Use plain Mucinex- regular strength or max strength is fine -For fever or aces or pains- take tylenol or ibuprofen. -Throat lozenges if help - Follow up if symptoms worsen or do not improve  - CMP14+EGFR - CBC with Differential/Platelet   Labs pending Patient reviewed in [place] controlled database, no flags noted. Contract and  drug screen are up to date.  Health Maintenance reviewed Diet and exercise encouraged  Follow up plan: 6 months    [name], FNP

## 2022-09-26 LAB — CBC WITH DIFFERENTIAL/PLATELET
Basophils Absolute: 0.1 10*3/uL (ref 0.0–0.2)
Basos: 1 %
EOS (ABSOLUTE): 0.4 10*3/uL (ref 0.0–0.4)
Eos: 4 %
Hematocrit: 40.2 % (ref 34.0–46.6)
Hemoglobin: 13.6 g/dL (ref 11.1–15.9)
Immature Grans (Abs): 0 10*3/uL (ref 0.0–0.1)
Immature Granulocytes: 0 %
Lymphocytes Absolute: 3.9 10*3/uL — ABNORMAL HIGH (ref 0.7–3.1)
Lymphs: 40 %
MCH: 29.3 pg (ref 26.6–33.0)
MCHC: 33.8 g/dL (ref 31.5–35.7)
MCV: 87 fL (ref 79–97)
Monocytes Absolute: 0.8 10*3/uL (ref 0.1–0.9)
Monocytes: 9 %
Neutrophils Absolute: 4.5 10*3/uL (ref 1.4–7.0)
Neutrophils: 46 %
Platelets: 309 10*3/uL (ref 150–450)
RBC: 4.64 x10E6/uL (ref 3.77–5.28)
RDW: 13.2 % (ref 11.7–15.4)
WBC: 9.7 10*3/uL (ref 3.4–10.8)

## 2022-09-26 LAB — TSH: TSH: 4.59 u[IU]/mL — ABNORMAL HIGH (ref 0.450–4.500)

## 2022-09-26 LAB — CMP14+EGFR
ALT: 11 IU/L (ref 0–32)
AST: 17 IU/L (ref 0–40)
Albumin/Globulin Ratio: 1.5 (ref 1.2–2.2)
Albumin: 4.7 g/dL (ref 3.8–4.8)
Alkaline Phosphatase: 87 IU/L (ref 44–121)
BUN/Creatinine Ratio: 11 — ABNORMAL LOW (ref 12–28)
BUN: 17 mg/dL (ref 8–27)
Bilirubin Total: 0.6 mg/dL (ref 0.0–1.2)
CO2: 21 mmol/L (ref 20–29)
Calcium: 9.5 mg/dL (ref 8.7–10.3)
Chloride: 101 mmol/L (ref 96–106)
Creatinine, Ser: 1.58 mg/dL — ABNORMAL HIGH (ref 0.57–1.00)
Globulin, Total: 3.1 g/dL (ref 1.5–4.5)
Glucose: 102 mg/dL — ABNORMAL HIGH (ref 70–99)
Potassium: 4.4 mmol/L (ref 3.5–5.2)
Sodium: 138 mmol/L (ref 134–144)
Total Protein: 7.8 g/dL (ref 6.0–8.5)
eGFR: 33 mL/min/{1.73_m2} — ABNORMAL LOW (ref >59)

## 2022-09-26 LAB — LIPID PANEL
Chol/HDL Ratio: 4.4 ratio (ref 0.0–4.4)
Cholesterol, Total: 234 mg/dL — ABNORMAL HIGH (ref 100–199)
HDL: 53 mg/dL (ref >39)
LDL Chol Calc (NIH): 113 mg/dL — ABNORMAL HIGH (ref 0–99)
Triglycerides: 393 mg/dL — ABNORMAL HIGH (ref 0–149)
VLDL Cholesterol Cal: 68 mg/dL — ABNORMAL HIGH (ref 5–40)

## 2022-09-29 ENCOUNTER — Other Ambulatory Visit: Payer: Self-pay | Admitting: Family

## 2022-09-30 LAB — TOXASSURE SELECT 13 (MW), URINE

## 2022-10-13 ENCOUNTER — Other Ambulatory Visit: Payer: Self-pay | Admitting: Family

## 2022-10-13 DIAGNOSIS — Z1231 Encounter for screening mammogram for malignant neoplasm of breast: Secondary | ICD-10-CM

## 2022-10-15 ENCOUNTER — Ambulatory Visit
Admission: RE | Admit: 2022-10-15 | Discharge: 2022-10-15 | Disposition: A | Discharge status: Home / Self Care | Payer: Medicare HMO | Source: Ambulatory Visit | Attending: Family | Admitting: Family

## 2022-10-15 DIAGNOSIS — Z1231 Encounter for screening mammogram for malignant neoplasm of breast: Secondary | ICD-10-CM

## 2022-10-23 ENCOUNTER — Telehealth: Payer: Self-pay | Admitting: Family

## 2022-10-23 NOTE — Telephone Encounter (Signed)
Contacted Cyntoria Hetzel to schedule their annual wellness visit. Appointment made for 10/27/2022.  Thank you,  Colletta Maryland,  [facility] Direct Dial ??[phone]  *

## 2022-10-27 ENCOUNTER — Ambulatory Visit (INDEPENDENT_AMBULATORY_CARE_PROVIDER_SITE_OTHER): Payer: Medicare HMO

## 2022-10-27 ENCOUNTER — Telehealth: Payer: Self-pay

## 2022-10-27 VITALS — Ht 61.0 in | Wt 186.0 lb

## 2022-10-27 DIAGNOSIS — Z0000 Encounter for general adult medical examination without abnormal findings: Secondary | ICD-10-CM | POA: Diagnosis not present

## 2022-10-27 DIAGNOSIS — E785 Hyperlipidemia, unspecified: Secondary | ICD-10-CM

## 2022-10-27 NOTE — Patient Instructions (Signed)
Susan Contreras , Thank you for taking time to come for your Medicare Wellness Visit. I appreciate your ongoing commitment to your health goals. Please review the following plan we discussed and let me know if I can assist you in the future.   These are the goals we discussed:  Goals      Remain active and independent        This is a list of the screening recommended for you and due dates:  Health Maintenance  Topic Date Due   COVID-19 Vaccine (3 - Moderna risk series) 05/01/2020   Flu Shot  11/23/2022*   Zoster (Shingles) Vaccine (1 of 2) 12/24/2022*   Mammogram  10/16/2023   Medicare Annual Wellness Visit  10/27/2023   DEXA scan (bone density measurement)  11/01/2023   DTaP/Tdap/Td vaccine (2 - Td or Tdap) 03/14/2029   Pneumonia Vaccine  Completed   Hepatitis C Screening: USPSTF Recommendation to screen - Ages 36-79 yo.  Completed   HPV Vaccine  Aged Out  *Topic was postponed. The date shown is not the original due date.    Advanced directives: Advance directive discussed with you today. I have provided a copy for you to complete at home and have notarized. Once this is complete please bring a copy in to our office so we can scan it into your chart.   Conditions/risks identified: Aim for 30 minutes of exercise or brisk walking, 6-8 glasses of water, and 5 servings of fruits and vegetables each day.   Next appointment: Follow up in one year for your annual wellness visit    Preventive Care 65 Years and Older, Female Preventive care refers to lifestyle choices and visits with your health care provider that can promote health and wellness. What does preventive care include? A yearly physical exam. This is also called an annual well check. Dental exams once or twice a year. Routine eye exams. Ask your health care provider how often you should have your eyes checked. Personal lifestyle choices, including: Daily care of your teeth and gums. Regular physical activity. Eating a healthy  diet. Avoiding tobacco and drug use. Limiting alcohol use. Practicing safe sex. Taking low-dose aspirin every day. Taking vitamin and mineral supplements as recommended by your health care provider. What happens during an annual well check? The services and screenings done by your health care provider during your annual well check will depend on your age, overall health, lifestyle risk factors, and family history of disease. Counseling  Your health care provider may ask you questions about your: Alcohol use. Tobacco use. Drug use. Emotional well-being. Home and relationship well-being. Sexual activity. Eating habits. History of falls. Memory and ability to understand (cognition). Work and work Statistician. Reproductive health. Screening  You may have the following tests or measurements: Height, weight, and BMI. Blood pressure. Lipid and cholesterol levels. These may be checked every 5 years, or more frequently if you are over 18 years old. Skin check. Lung cancer screening. You may have this screening every year starting at age 69 if you have a 30-pack-year history of smoking and currently smoke or have quit within the past 15 years. Fecal occult blood test (FOBT) of the stool. You may have this test every year starting at age 80. Flexible sigmoidoscopy or colonoscopy. You may have a sigmoidoscopy every 5 years or a colonoscopy every 10 years starting at age 82. Hepatitis C blood test. Hepatitis B blood test. Sexually transmitted disease (STD) testing. Diabetes screening. This is done by  checking your blood sugar (glucose) after you have not eaten for a while (fasting). You may have this done every 1-3 years. Bone density scan. This is done to screen for osteoporosis. You may have this done starting at age 77. Mammogram. This may be done every 1-2 years. Talk to your health care provider about how often you should have regular mammograms. Talk with your health care provider about  your test results, treatment options, and if necessary, the need for more tests. Vaccines  Your health care provider may recommend certain vaccines, such as: Influenza vaccine. This is recommended every year. Tetanus, diphtheria, and acellular pertussis (Tdap, Td) vaccine. You may need a Td booster every 10 years. Zoster vaccine. You may need this after age 10. Pneumococcal 13-valent conjugate (PCV13) vaccine. One dose is recommended after age 30. Pneumococcal polysaccharide (PPSV23) vaccine. One dose is recommended after age 4. Talk to your health care provider about which screenings and vaccines you need and how often you need them. This information is not intended to replace advice given to you by your health care provider. Make sure you discuss any questions you have with your health care provider. Document Released: 09/07/2015 Document Revised: 04/30/2016 Document Reviewed: 06/12/2015 Elsevier Interactive Patient Education  2017 [place] Prevention in the Home Falls can cause injuries. They can happen to people of all ages. There are many things you can do to make your home safe and to help prevent falls. What can I do on the outside of my home? Regularly fix the edges of walkways and driveways and fix any cracks. Remove anything that might make you trip as you walk through a door, such as a raised step or threshold. Trim any bushes or trees on the path to your home. Use bright outdoor lighting. Clear any walking paths of anything that might make someone trip, such as rocks or tools. Regularly check to see if handrails are loose or broken. Make sure that both sides of any steps have handrails. Any raised decks and porches should have guardrails on the edges. Have any leaves, snow, or ice cleared regularly. Use sand or salt on walking paths during winter. Clean up any spills in your garage right away. This includes oil or grease spills. What can I do in the bathroom? Use  night lights. Install grab bars by the toilet and in the tub and shower. Do not use towel bars as grab bars. Use non-skid mats or decals in the tub or shower. If you need to sit down in the shower, use a plastic, non-slip stool. Keep the floor dry. Clean up any water that spills on the floor as soon as it happens. Remove soap buildup in the tub or shower regularly. Attach bath mats securely with double-sided non-slip rug tape. Do not have throw rugs and other things on the floor that can make you trip. What can I do in the bedroom? Use night lights. Make sure that you have a light by your bed that is easy to reach. Do not use any sheets or blankets that are too big for your bed. They should not hang down onto the floor. Have a firm chair that has side arms. You can use this for support while you get dressed. Do not have throw rugs and other things on the floor that can make you trip. What can I do in the kitchen? Clean up any spills right away. Avoid walking on wet floors. Keep items that you use a  lot in easy-to-reach places. If you need to reach something above you, use a strong step stool that has a grab bar. Keep electrical cords out of the way. Do not use floor polish or wax that makes floors slippery. If you must use wax, use non-skid floor wax. Do not have throw rugs and other things on the floor that can make you trip. What can I do with my stairs? Do not leave any items on the stairs. Make sure that there are handrails on both sides of the stairs and use them. Fix handrails that are broken or loose. Make sure that handrails are as long as the stairways. Check any carpeting to make sure that it is firmly attached to the stairs. Fix any carpet that is loose or worn. Avoid having throw rugs at the top or bottom of the stairs. If you do have throw rugs, attach them to the floor with carpet tape. Make sure that you have a light switch at the top of the stairs and the bottom of the  stairs. If you do not have them, ask someone to add them for you. What else can I do to help prevent falls? Wear shoes that: Do not have high heels. Have rubber bottoms. Are comfortable and fit you well. Are closed at the toe. Do not wear sandals. If you use a stepladder: Make sure that it is fully opened. Do not climb a closed stepladder. Make sure that both sides of the stepladder are locked into place. Ask someone to hold it for you, if possible. Clearly mark and make sure that you can see: Any grab bars or handrails. First and last steps. Where the edge of each step is. Use tools that help you move around (mobility aids) if they are needed. These include: Canes. Walkers. Scooters. Crutches. Turn on the lights when you go into a dark area. Replace any light bulbs as soon as they burn out. Set up your furniture so you have a clear path. Avoid moving your furniture around. If any of your floors are uneven, fix them. If there are any pets around you, be aware of where they are. Review your medicines with your doctor. Some medicines can make you feel dizzy. This can increase your chance of falling. Ask your doctor what other things that you can do to help prevent falls. This information is not intended to replace advice given to you by your health care provider. Make sure you discuss any questions you have with your health care provider. Document Released: 06/07/2009 Document Revised: 01/17/2016 Document Reviewed: 09/15/2014 Elsevier Interactive Patient Education  2017 [employer].

## 2022-10-27 NOTE — Progress Notes (Signed)
Subjective:   Susan Contreras is a 82 y.o. female who presents for an Initial Medicare Annual Wellness Visit.  I connected with  [name] on 10/27/22 by a audio enabled telemedicine application and verified that I am speaking with the correct person using two identifiers.  Patient Location: Home  Provider Location: Home Office  I discussed the limitations of evaluation and management by telemedicine. The patient expressed understanding and agreed to proceed.  Review of Systems     Cardiac Risk Factors include: advanced age (>79mn, >>62women);dyslipidemia;hypertension     Objective:    Today's Vitals   10/27/22 1529  Weight: 186 lb (84.4 kg)  Height: '5\' 1"'$  (1.549 m)   Body mass index is 35.14 kg/m.     10/27/2022    6:19 PM 10/29/2019    3:23 PM 10/19/2019   11:16 AM 06/26/2019    2:30 PM 05/31/2019   10:55 AM 03/10/2018   11:31 AM 08/14/2015    9:33 AM  Advanced Directives  Does Patient Have a Medical Advance Directive? No No No No No No No  Would patient like information on creating a medical advance directive? Yes (MAU/Ambulatory/Procedural Areas - Information given)  No - Patient declined No - Patient declined No - Patient declined  No - patient declined information    Current Medications (verified) Outpatient Encounter Medications as of 10/27/2022  Medication Sig   ALPRAZolam (XANAX) 0.25 MG tablet TAKE 1 TABLET TWICE DAILY AS NEEDED FOR ANXIETY   cetirizine (ZYRTEC) 10 MG tablet Take 1 tablet (10 mg total) by mouth daily.   Chlorphen-PE-Acetaminophen (NOREL AD PO) Take 1 tablet by mouth as needed.    Cholecalciferol (VITAMIN D3) 125 MCG (5000 UT) CAPS Take 1 capsule (5,000 Units total) by mouth daily.   famotidine (PEPCID) 40 MG tablet Take 1 tablet (40 mg total) by mouth daily.   fluticasone (FLONASE) 50 MCG/ACT nasal spray Place 1 spray into both nostrils daily.   irbesartan (AVAPRO) 75 MG tablet TAKE ONE TABLET ONCE DAILY   levothyroxine (SYNTHROID) 50 MCG tablet  TAKE ONE TABLET BY MOUTH ONCE DAILY   meclizine (ANTIVERT) 25 MG tablet TAKE ONE TABLET BY MOUTH THREE TIMES DAILY AS NEEDED FOR DIZZINESS   pantoprazole (PROTONIX) 20 MG tablet TAKE ONE TABLET TWICE DAILY BEFORE MEALS   atorvastatin (LIPITOR) 20 MG tablet Take 1 tablet (20 mg total) by mouth daily. (Patient not taking: Reported on 10/27/2022)   No facility-administered encounter medications on file as of 10/27/2022.    Allergies (verified) Biaxin [clarithromycin], Cephalexin, Ciprofloxacin, Doxycycline, Flagyl [metronidazole hcl], Ketek [telithromycin], Pneumovax [pneumococcal polysaccharide vaccine], and Sulfa antibiotics   History: Past Medical History:  Diagnosis Date   Anxiety    Depression    Diverticulosis    Esophageal stricture    Family hx of colorectal cancer    GERD (gastroesophageal reflux disease)    Helicobacter pylori (H. pylori) 1997   EGD   Hemorrhoids    Hiatal hernia    Hx of adenomatous colonic polyps    Hyperlipemia    Hypertension    PUD (peptic ulcer disease) 1997   EGD   Thyroid disease    Vertigo    Past Surgical History:  Procedure Laterality Date   ABDOMINAL HYSTERECTOMY     BILATERAL OOPHORECTOMY     CHOLECYSTECTOMY     Family History  Problem Relation Age of Onset   Colon cancer Sister    Stroke Mother    Heart disease Mother  Osteoporosis Mother    Parkinson's disease Father    Osteoporosis Father    Ovarian cancer Sister    Esophageal cancer Neg Hx    Stomach cancer Neg Hx    Kidney disease Neg Hx    Liver disease Neg Hx    Diabetes Neg Hx    Social History   Socioeconomic History   Marital status: Married    Spouse name: Not on file   Number of children: 2   Years of education: Not on file   Highest education level: Not on file  Occupational History   Occupation: Retired  Tobacco Use   Smoking status: Never   Smokeless tobacco: Never  Vaping Use   Vaping Use: Never used  Substance and Sexual Activity   Alcohol use: No    Drug use: No   Sexual activity: Not on file  Other Topics Concern   Not on file  Social History Narrative   2 caffeine drinks daily    Social Determinants of Health   Financial Resource Strain: Low Risk  (10/27/2022)   Overall Financial Resource Strain (CARDIA)    Difficulty of Paying Living Expenses: Not hard at all  Food Insecurity: No Food Insecurity (10/27/2022)   Hunger Vital Sign    Worried About Running Out of Food in the Last Year: Never true    [place] in the Last Year: Never true  Transportation Needs: No Transportation Needs (10/27/2022)   PRAPARE - Hydrologist (Medical): No    Lack of Transportation (Non-Medical): No  Physical Activity: Insufficiently Active (10/27/2022)   Exercise Vital Sign    Days of Exercise per Week: 3 days    Minutes of Exercise per Session: 30 min  Stress: No Stress Concern Present (10/27/2022)   [place]    Feeling of Stress : Only a little  Social Connections: Moderately Integrated (10/27/2022)   Social Connection and Isolation Panel [NHANES]    Frequency of Communication with Friends and Family: More than three times a week    Frequency of Social Gatherings with Friends and Family: More than three times a week    Attends Religious Services: More than 4 times per year    Active Member of [employer] or Organizations: No    Attends Music therapist: Never    Marital Status: Married    Tobacco Counseling Counseling given: Not Answered   Clinical Intake:  Pre-visit preparation completed: Yes  Pain : No/denies pain  Diabetes: No  How often do you need to have someone help you when you read instructions, pamphlets, or other written materials from your doctor or pharmacy?: 2 - Rarely  Diabetic?No   Interpreter Needed?: No  Information entered by :: Denman George LPN   Activities of Daily Living    10/27/2022    6:19 PM  In  your present state of health, do you have any difficulty performing the following activities:  Hearing? 0  Vision? 0  Difficulty concentrating or making decisions? 0  Walking or climbing stairs? 0  Dressing or bathing? 0  Doing errands, shopping? 0  Preparing Food and eating ? N  Using the Toilet? N  In the past six months, have you accidently leaked urine? N  Do you have problems with loss of bowel control? N  Managing your Medications? N  Managing your Finances? N  Housekeeping or managing your Housekeeping? N    Patient  Care Team: [name], FNP as PCP - General (Nurse Practitioner) Arnoldo Lenis, MD as PCP - Cardiology (Cardiology)  Indicate any recent Medical Services you may have received from other than [facility] providers in the past year (date may be approximate).     Assessment:   This is a routine wellness examination for Susan Contreras.  Hearing/Vision screen Hearing Screening - Comments:: Denies hearing difficulties  Vision Screening - Comments:: Wears rx glasses - up to date with routine eye exams with Dr. Hassell Done     Dietary issues and exercise activities discussed: Current Exercise Habits: Home exercise routine, Type of exercise: walking, Time (Minutes): 30, Frequency (Times/Week): 3, Weekly Exercise (Minutes/Week): 90, Intensity: Mild   Goals Addressed               This Visit's Progress     COMPLETED: "I need help managing my anxiety/stress" (pt-stated)        CARE PLAN ENTRY   Anxiety in a patient with HTN, GAD, GERD, HLD, Hypothyroidism  Current Barriers:  Care Coordination needs related to anxiety and caregiver strain in a patient with HTN, hypothyroidism, depression, and anxiety  Nurse Case Manager Clinical Goal(s):  Over the next 90 days, patient will continue to talk with CCM team regarding stress and anxiety management  Interventions:  Inter-disciplinary care team collaboration (see longitudinal plan of care) Chart reviewed including  recent office notes and lab results Spoke with patient by telephone regarding stress and anxiety related to husband's medical conditions.  Reviewed medications Encouraged patient to talk with LCSW regarding psychosocial issues Patient previously provided with CCM contact information and encouraged to reach out as needed  Patient Self Care Activities:  Performs ADL's independently Performs IADL's independently  Please see past updates related to this goal by clicking on the "Past Updates" button in the selected goal        COMPLETED: "I need help managing my thyroid" (pt-stated)        [place] (see longitudinal plan of care for additional care plan information)  Current Barriers:  Chronic Disease Management support and education needs related to hypothyroidism  Nurse Case Manager Clinical Goal(s):  Over the next 60 days, patient will talk with RN Care Manager regarding hypothyroidism Over the next 90 days, patient will keep all scheduled medical appointments  Interventions:  Inter-disciplinary care team collaboration (see longitudinal plan of care) Chart reviewed including recent office notes and lab results Discussed visit with Dr Dorris Fetch and upcoming follow up appt Reviewed and discussed medications: Synthroid and how to take it Keep all follow-up appointments Encouraged patient to reach out to [facility] as needed  Patient Self Care Activities:  Performs ADL's independently Performs IADL's independently  Please see past updates related to this goal by clicking on the "Past Updates" button in the selected goal        COMPLETED: "I need to manage my cholesterol" (pt-stated)        CARE PLAN ENTRY (see longitudinal plan of care for additional care plan information)  Current Barriers:  Chronic Disease Management support and education needs related to hyperlipidemia  Nurse Case Manager Clinical Goal(s):  Over the next 90 days, patient will keep all scheduled medical  appointments Over the next 90 days, patient will take medications as prescribed Over the next 60 days, patient will talk with RN Care Manager regarding hyperlipidemia  Interventions:  Inter-disciplinary care team collaboration (see longitudinal plan of care) Chart reviewed including recent office notes and lab results Discussed  lipids and triglycerides Reviewed and discussed medications Patient restarted lipitor about 5 weeks ago. Had d/c due to leg pain. No pain at this time.  Encouraged patient to take medications as prescribed Recommended good quality OTC Omega 3 fish oil for triglycerides Discussed diet and exercise Reviewed upcoming appointments Encouraged patient to reach out to CCM team as needed  Patient Self Care Activities:  Performs ADL's independently Performs IADL's independently  Initial goal documentation       COMPLETED: "I want to keep my blood pressure under control" (pt-stated)        CARE PLAN ENTRY (see longitudinal plan of care for additional care plan information)  Current Barriers:  Chronic Disease Management support and education needs related to hypertension  Nurse Case Manager Clinical Goal(s):  Over the next 60 days, patient will talk with RN Care Manager regarding blood pressure control Over the next 90 days, patient will keep all scheduled medical appointments Over the next 90 days, patient will continue to demonstrate health care management by checking and recording blood pressure daily  Interventions:  Inter-disciplinary care team collaboration (see longitudinal plan of care) Chart reviewed including recent office notes and lab results Reviewed and discussed medications Avapro is being held due to hypotensive episodes Encouraged patient to continue to check and record blood pressure readings daily Patient reports normal home readings Call PCP at [phone] with any readings outside of recommended range Encouraged patient to reach out to [facility]  team as needed  Patient Self Care Activities:  Performs ADL's independently Performs IADL's independently  Initial goal documentation       COMPLETED: Client will talk with LCSW in next 30 days to discuss anxiety /stress issues of concern for patinet at this time (pt-stated)        [place] (see longtitudinal plan of care for additional care plan information)  Current Barriers:  Mental health issues of concern (anxiety/stress) in client with Chronic Diagnoses of HTN, GAD, GERD, and HLD Decreased energy Transportation challenges.  Clinical Social Work Clinical Goal(s):  LCSW will talk with client in next 30 days to discuss client managenet of anxiety or stress issues.   Interventions: Talked with client about CCM program support services Talked with client about weight issue of client (she said she lost 23 pounds since Octoboer of 2020) Talked with Airel about [name] PA psychiatric referral for client  Talked with Kimberlly about anxiety or stress issues she faces.    Talked with Aavya about transport needs of client Talked with Jaxsyn about her decreased appetite Talked with client about sleeping challenges Talked with Mirel about mucous issues (she said that she talked with [name] PA about this issue)   Patient Self Care Activities:  Takes medications as prescribed Attends scheduled medical appointments  Patient SELF CARE DEFICITS: Transportation challenges Sleeping challenges Mental health issues    Initial goal documentation       COMPLETED: Elevated Kidney Functions        CARE PLAN ENTRY (see longitudinal plan of care for additional care plan information)  Current Barriers:  Chronic Disease Management support and education needs related to chronically elevated creatinine without a diagnosis of CKD  Nurse Case Manager Clinical Goal(s):  Over the next 60 days, patient will work with PCP regarding elevated kidney function  levels  Interventions:  Inter-disciplinary care team collaboration (see longitudinal plan of care) Chart reviewed including recent office notes and lab results Kidney function stable per PCP notes Medications reviewed No NSAID  use Previously communicated with PCP regarding whether an official CKD diagnosis needs to be made Reviewed upcoming appointments Encouraged patient to reach out to CCM team as needed  Patient Self Care Activities:  Performs ADL's independently Performs IADL's independently  Initial goal documentation       COMPLETED: Manage GI Complaints        Timeframe:  Short-Term Goal Priority:  Medium Start Date:  11/19/20                           Expected End Date:  01/19/21                      Follow-up 01/08/21  Keep appointment with Dr Laural Golden (GI) to discuss potential need for colonoscopy on 01/03/21 Reach out to GI with any new or worsening symptoms Call RN Care Manager as needed [phone]      COMPLETED: Manage My Emotions. Manage Anxiety and Stress issues        Timeframe:  Short-Term Goal Priority:  Medium Progress: On Track Start Date:     11/19/21           Expected End Date:    02/11/22                   Follow Up Date 01/09/22 at 1:00 PM    Manage My Emotions; Manage Anxiety and Stress issues    Why is this important?   When you are stressed, down or upset, your body reacts too.  For example, your blood pressure may get higher; you may have a headache or stomachache.  When your emotions get the best of you, your body's ability to fight off cold and flu gets weak.  These steps will help you manage your emotions.    Coping Skills of Patient: Has some family support from her spouse and from her son Has no driving issues Has food supply; has medications prescribed; has no walking issues  Patient Deficits:  Anxiety and stress issues Some irritability occasionally Sometimes forgets to take prescribed medications on schedule  Patient Goals:  In next 30 days, patient will: Attend scheduled medical appointments Take medications as prescribed Communicate with spouse and son about her ongoing needs  Follow Up Plan:  LCSW to call client on 01/09/22 at 1:00 PM to assess client needs at that time.       Remain active and independent        COMPLETED: Self-Management Plan Developed: Client wants to use strategies to manage daily stress or anxiety issues faced (pt-stated)        needs help in managing anxiety and stress issues faced     Timeframe: Short Term Goal   This Visit's Progress: Not On Track  Priority: Medium   Start Date: 06/06/21  Expected End Date  08/30/21  Follow up date: 09/25/21 at 11:15 AM   Manage Stress and Anxiety issues faced  Patient Self Care Activities:  Self administers medications as prescribed Attends all scheduled provider appointments Calls provider office for new concerns or questions  Patient Coping Strengths:  Supportive Relationships Family Friends  Patient Self Care Deficits:  Anxiety or stress issues related to managing ongoing chronic care needs of her spouse  Patient Goals:  - spend time or talk with others every day - practice relaxation or meditation daily - keep a calendar with appointment dates  Follow Up Plan: LCSW to call client on 09/25/21 at  11:15 AM to assess client needs.                Depression Screen    10/27/2022    6:17 PM 09/25/2022   10:40 AM 03/25/2022    9:48 AM 03/10/2022   12:09 PM 01/29/2022    1:16 PM 11/19/2021    2:07 PM 09/25/2021   12:15 PM  PHQ 2/9 Scores  PHQ - 2 Score 0   0 0 2 2  PHQ- 9 Score    0  9 9  Exception Documentation  Patient refusal Patient refusal        Fall Risk    10/27/2022    6:16 PM 03/25/2022    9:28 AM 03/10/2022   12:09 PM 01/29/2022    1:16 PM 01/23/2021   10:49 AM  Fall Risk   Falls in the past year? 0 0 0 0 0  Number falls in past yr: 0  0    Injury with Fall? 0  0    Follow up Falls prevention  discussed;Education provided;Falls evaluation completed  Falls evaluation completed      FALL RISK PREVENTION PERTAINING TO THE HOME:  Any stairs in or around the home? No  If so, are there any without handrails? No  Home free of loose throw rugs in walkways, pet beds, electrical cords, etc? Yes  Adequate lighting in your home to reduce risk of falls? Yes   ASSISTIVE DEVICES UTILIZED TO PREVENT FALLS:  Life alert? No  Use of a cane, walker or w/c? No  Grab bars in the bathroom? Yes  Shower chair or bench in shower? No  Elevated toilet seat or a handicapped toilet? Yes   TIMED UP AND GO:  Was the test performed? No . Telephonic visit   Cognitive Function:        10/27/2022    6:20 PM  6CIT Screen  What Year? 0 points  What month? 0 points  What time? 0 points  Count back from 20 0 points  Months in reverse 0 points  Repeat phrase 0 points  Total Score 0 points    Immunizations Immunization History  Administered Date(s) Administered   Moderna Sars-Covid-2 Vaccination 03/06/2020, 04/03/2020   PNEUMOCOCCAL CONJUGATE-20 10/29/2021   Pneumococcal Conjugate-13 08/07/2020   Tdap 03/15/2019    TDAP status: Up to date  Flu Vaccine status: Declined, Education has been provided regarding the importance of this vaccine but patient still declined. Advised may receive this vaccine at local pharmacy or Health Dept. Aware to provide a copy of the vaccination record if obtained from local pharmacy or Health Dept. Verbalized acceptance and understanding.  Pneumococcal vaccine status: Up to date  Covid-19 vaccine status: Information provided on how to obtain vaccines.   Qualifies for Shingles Vaccine? Yes   Zostavax completed No   Shingrix Completed?: No.    Education has been provided regarding the importance of this vaccine. Patient has been advised to call insurance company to determine out of pocket expense if they have not yet received this vaccine. Advised may also receive  vaccine at local pharmacy or Health Dept. Verbalized acceptance and understanding.  Screening Tests Health Maintenance  Topic Date Due   COVID-19 Vaccine (3 - Moderna risk series) 05/01/2020   INFLUENZA VACCINE  11/23/2022 (Originally 03/25/2022)   Zoster Vaccines- Shingrix (1 of 2) 12/24/2022 (Originally 01/14/1960)   MAMMOGRAM  10/16/2023   Medicare Annual Wellness (AWV)  10/27/2023   DEXA SCAN  11/01/2023   DTaP/Tdap/Td (  2 - Td or Tdap) 03/14/2029   Pneumonia Vaccine 81+ Years old  Completed   Hepatitis C Screening  Completed   HPV VACCINES  Aged Out    Health Maintenance  Health Maintenance Due  Topic Date Due   COVID-19 Vaccine (3 - Moderna risk series) 05/01/2020    Colorectal cancer screening: No longer required.   Mammogram status: Completed 10/15/22. Repeat every year  Bone Density status: Completed 10/31/21. Results reflect: Bone density results: OSTEOPENIA. Repeat every 2 years.  Lung Cancer Screening: (Low Dose CT Chest recommended if Age 25-80 years, 30 pack-year currently smoking OR have quit w/in 15years.) does not qualify.   Lung Cancer Screening Referral: n/a  Additional Screening:  Hepatitis C Screening: does qualify; Completed 04/13/20  Vision Screening: Recommended annual ophthalmology exams for early detection of glaucoma and other disorders of the eye. Is the patient up to date with their annual eye exam?  Yes  Who is the provider or what is the name of the office in which the patient attends annual eye exams? Dr. Okey Regal  If pt is not established with a provider, would they like to be referred to a provider to establish care? No .   Dental Screening: Recommended annual dental exams for proper oral hygiene  Community Resource Referral / Chronic Care Management: CRR required this visit?  No   CCM required this visit?  No      Plan:     I have personally reviewed and noted the following in the patient's chart:   Medical and social  history Use of alcohol, tobacco or illicit drugs  Current medications and supplements including opioid prescriptions. Patient is not currently taking opioid prescriptions. Functional ability and status Nutritional status Physical activity Advanced directives List of other physicians Hospitalizations, surgeries, and ER visits in previous 12 months Vitals Screenings to include cognitive, depression, and falls Referrals and appointments  In addition, I have reviewed and discussed with patient certain preventive protocols, quality metrics, and best practice recommendations. A written personalized care plan for preventive services as well as general preventive health recommendations were provided to patient.     Vanetta Mulders, [place]   [id number]   Due to this being a virtual visit, the after visit summary with patients personalized plan was offered to patient via mail or my-chart. per request, patient was mailed a copy of AVS.  Nurse Notes: Patient has not been taking Atorvastatin because she states that it makes her "feel bad".  Plans to restart but wondering if she can take a lower dose or alternate days.  Would also like to know if there are any alternatives to statins that she can take.

## 2022-10-27 NOTE — Telephone Encounter (Signed)
Patient seen for AWV and has not been taking Atorvastatin because she states that it makes her "feel bad".  Plans to restart but wondering if she can take a lower dose or alternate days.  Would also like to know if there are any alternatives to statins that she can take.

## 2022-10-28 MED ORDER — ROSUVASTATIN CALCIUM 5 MG PO TABS: 5.0000 mg | ORAL_TABLET | ORAL | 3 refills | Status: DC

## 2022-10-28 MED ORDER — ATORVASTATIN CALCIUM 20 MG PO TABS: 20.0000 mg | ORAL_TABLET | ORAL | 1 refills | Status: DC

## 2022-10-28 NOTE — Addendum Note (Signed)
Addended by: [name] A on: 10/28/2022 02:06 PM   Modules accepted: Orders

## 2022-10-28 NOTE — Telephone Encounter (Signed)
Lipitor changed to Crestor 5 mg and take every other day.   [name], FNP

## 2022-10-28 NOTE — Telephone Encounter (Signed)
Take her current dose every other day.   [name], FNP

## 2022-10-28 NOTE — Telephone Encounter (Signed)
Patient does not want this. Wants to stay on what she is one and caught in half.

## 2022-11-04 ENCOUNTER — Ambulatory Visit (INDEPENDENT_AMBULATORY_CARE_PROVIDER_SITE_OTHER): Payer: Medicare HMO | Admitting: Family Medicine

## 2022-11-04 ENCOUNTER — Encounter: Payer: Self-pay | Admitting: Family Medicine

## 2022-11-04 VITALS — BP 136/68 | HR 70 | Temp 96.7°F | Ht 61.0 in | Wt 186.4 lb

## 2022-11-04 DIAGNOSIS — L821 Other seborrheic keratosis: Secondary | ICD-10-CM | POA: Diagnosis not present

## 2022-11-04 DIAGNOSIS — H524 Presbyopia: Secondary | ICD-10-CM | POA: Diagnosis not present

## 2022-11-04 DIAGNOSIS — L570 Actinic keratosis: Secondary | ICD-10-CM

## 2022-11-04 NOTE — Progress Notes (Signed)
Subjective:  Patient ID: Susan Contreras, female    DOB: 04-05-1941, 82 y.o.   MRN: [id number]  Patient Care Team: [name], FNP as PCP - General (Nurse Practitioner) Arnoldo Lenis, MD as PCP - Cardiology (Cardiology)   Chief Complaint:  Referral (Dermatology- [facility] Dr. Karma Lew  )   HPI: Susan Contreras is a 82 y.o. female presenting on 11/04/2022 for Referral (Dermatology- [facility] Dr. Karma Lew  )   Pt presents today for referral to dermatology. She has a history of several AK and SK lesions which have been treated in the past. Her dermatologist has switched offices and pt needs a referral to see her at new practice. She denies changes in lesions, bleeding, tenderness, or pain. States pruritic at times.     Relevant past medical, surgical, family, and social history reviewed and updated as indicated.  Allergies and medications reviewed and updated. Data reviewed: Chart in Epic.   Past Medical History:  Diagnosis Date   Anxiety    Depression    Diverticulosis    Esophageal stricture    Family hx of colorectal cancer    GERD (gastroesophageal reflux disease)    Helicobacter pylori (H. pylori) 1997   EGD   Hemorrhoids    Hiatal hernia    Hx of adenomatous colonic polyps    Hyperlipemia    Hypertension    PUD (peptic ulcer disease) 1997   EGD   Thyroid disease    Vertigo     Past Surgical History:  Procedure Laterality Date   ABDOMINAL HYSTERECTOMY     BILATERAL OOPHORECTOMY     CHOLECYSTECTOMY      Social History   Socioeconomic History   Marital status: Married    Spouse name: Not on file   Number of children: 2   Years of education: Not on file   Highest education level: Not on file  Occupational History   Occupation: Retired  Tobacco Use   Smoking status: Never   Smokeless tobacco: Never  Vaping Use   Vaping Use: Never used  Substance and Sexual Activity   Alcohol use: No   Drug use: No   Sexual  activity: Not on file  Other Topics Concern   Not on file  Social History Narrative   2 caffeine drinks daily    Social Determinants of Health   Financial Resource Strain: Low Risk  (10/27/2022)   Overall Financial Resource Strain (CARDIA)    Difficulty of Paying Living Expenses: Not hard at all  Food Insecurity: No Food Insecurity (10/27/2022)   Hunger Vital Sign    Worried About Running Out of Food in the Last Year: Never true    Ran Out of Food in the Last Year: Never true  Transportation Needs: No Transportation Needs (10/27/2022)   PRAPARE - Hydrologist (Medical): No    Lack of Transportation (Non-Medical): No  Physical Activity: Insufficiently Active (10/27/2022)   Exercise Vital Sign    Days of Exercise per Week: 3 days    Minutes of Exercise per Session: 30 min  Stress: No Stress Concern Present (10/27/2022)   [place]    Feeling of Stress : Only a little  Social Connections: Moderately Integrated (10/27/2022)   Social Connection and Isolation Panel [NHANES]    Frequency of Communication with Friends and Family: More than three times a week    Frequency of Social Gatherings  with Friends and Family: More than three times a week    Attends Religious Services: More than 4 times per year    Active Member of Clubs or Organizations: No    Attends Archivist Meetings: Never    Marital Status: Married  Human resources officer Violence: Not At Risk (10/27/2022)   Humiliation, Afraid, Rape, and Kick questionnaire    Fear of Current or Ex-Partner: No    Emotionally Abused: No    Physically Abused: No    Sexually Abused: No    Outpatient Encounter Medications as of 11/04/2022  Medication Sig   ALPRAZolam (XANAX) 0.25 MG tablet TAKE 1 TABLET TWICE DAILY AS NEEDED FOR ANXIETY   atorvastatin (LIPITOR) 20 MG tablet Take 1 tablet (20 mg total) by mouth every other day.   cetirizine (ZYRTEC) 10 MG  tablet Take 1 tablet (10 mg total) by mouth daily.   Chlorphen-PE-Acetaminophen (NOREL AD PO) Take 1 tablet by mouth as needed.    Cholecalciferol (VITAMIN D3) 125 MCG (5000 UT) CAPS Take 1 capsule (5,000 Units total) by mouth daily.   famotidine (PEPCID) 40 MG tablet Take 1 tablet (40 mg total) by mouth daily.   fluticasone (FLONASE) 50 MCG/ACT nasal spray Place 1 spray into both nostrils daily.   irbesartan (AVAPRO) 75 MG tablet TAKE ONE TABLET ONCE DAILY   levothyroxine (SYNTHROID) 50 MCG tablet TAKE ONE TABLET BY MOUTH ONCE DAILY   meclizine (ANTIVERT) 25 MG tablet TAKE ONE TABLET BY MOUTH THREE TIMES DAILY AS NEEDED FOR DIZZINESS   pantoprazole (PROTONIX) 20 MG tablet TAKE ONE TABLET TWICE DAILY BEFORE MEALS   No facility-administered encounter medications on file as of 11/04/2022.    Allergies  Allergen Reactions   Biaxin [Clarithromycin]    Cephalexin    Ciprofloxacin    Doxycycline     Headaches   Flagyl [Metronidazole Hcl]    Ketek [Telithromycin]    Pneumovax [Pneumococcal Polysaccharide Vaccine]    Sulfa Antibiotics     Review of Systems  Constitutional:  Negative for activity change, appetite change, chills, diaphoresis, fatigue, fever and unexpected weight change.  HENT: Negative.    Eyes: Negative.   Respiratory:  Negative for cough, chest tightness and shortness of breath.   Cardiovascular:  Negative for chest pain, palpitations and leg swelling.  Gastrointestinal:  Negative for abdominal pain, blood in stool, constipation, diarrhea, nausea and vomiting.  Endocrine: Negative.   Genitourinary:  Negative for dysuria, frequency and urgency.  Musculoskeletal:  Negative for arthralgias and myalgias.  Skin:  Positive for rash. Negative for color change, pallor and wound.  Allergic/Immunologic: Negative.   Neurological:  Negative for dizziness and headaches.  Hematological: Negative.   Psychiatric/Behavioral:  Negative for confusion, hallucinations, sleep disturbance  and suicidal ideas.   All other systems reviewed and are negative.       Objective:  BP 136/68   Pulse 70   Temp (!) 96.7 F (35.9 C) (Temporal)   Ht '5\' 1"'$  (1.549 m)   Wt 186 lb 6.4 oz (84.6 kg)   SpO2 98%   BMI 35.22 kg/m    Wt Readings from Last 3 Encounters:  11/04/22 186 lb 6.4 oz (84.6 kg)  10/27/22 186 lb (84.4 kg)  09/25/22 186 lb 3.2 oz (84.5 kg)    Physical Exam Vitals and nursing note reviewed.  Constitutional:      General: She is not in acute distress.    Appearance: Normal appearance. She is obese. She is not ill-appearing, toxic-appearing or diaphoretic.  HENT:     Head: Normocephalic and atraumatic.     Mouth/Throat:     Mouth: Mucous membranes are moist.  Eyes:     Pupils: Pupils are equal, round, and reactive to light.  Cardiovascular:     Rate and Rhythm: Normal rate and regular rhythm.  Pulmonary:     Effort: Pulmonary effort is normal.     Breath sounds: Normal breath sounds.  Skin:    General: Skin is warm and dry.     Capillary Refill: Capillary refill takes less than 2 seconds.     Comments: Multiple severely scaly, well-circumscribed lesions to back with stuck-on appearance. Several hyperkeratotic lesions to back. No erythema noted.    Neurological:     General: No focal deficit present.     Mental Status: She is alert and oriented to person, place, and time.  Psychiatric:        Mood and Affect: Mood normal.        Behavior: Behavior normal.        Thought Content: Thought content normal.        Judgment: Judgment normal.     Results for orders placed or performed in visit on 09/25/22  CMP14+EGFR  Result Value Ref Range   Glucose 102 (H) 70 - 99 mg/dL   BUN 17 8 - 27 mg/dL   Creatinine, Ser 1.58 (H) 0.57 - 1.00 mg/dL   eGFR 33 (L) >59 mL/min/1.73   BUN/Creatinine Ratio 11 (L) 12 - 28   Sodium 138 134 - 144 mmol/L   Potassium 4.4 3.5 - 5.2 mmol/L   Chloride 101 96 - 106 mmol/L   CO2 21 20 - 29 mmol/L   Calcium 9.5 8.7 - 10.3  mg/dL   Total Protein 7.8 6.0 - 8.5 g/dL   Albumin 4.7 3.8 - 4.8 g/dL   Globulin, Total 3.1 1.5 - 4.5 g/dL   Albumin/Globulin Ratio 1.5 1.2 - 2.2   Bilirubin Total 0.6 0.0 - 1.2 mg/dL   Alkaline Phosphatase 87 44 - 121 IU/L   AST 17 0 - 40 IU/L   ALT 11 0 - 32 IU/L  CBC with Differential/Platelet  Result Value Ref Range   WBC 9.7 3.4 - 10.8 x10E3/uL   RBC 4.64 3.77 - 5.28 x10E6/uL   Hemoglobin 13.6 11.1 - 15.9 g/dL   Hematocrit 40.2 34.0 - 46.6 %   MCV 87 79 - 97 fL   MCH 29.3 26.6 - 33.0 pg   MCHC 33.8 31.5 - 35.7 g/dL   RDW 13.2 11.7 - 15.4 %   Platelets 309 150 - 450 x10E3/uL   Neutrophils 46 Not Estab. %   Lymphs 40 Not Estab. %   Monocytes 9 Not Estab. %   Eos 4 Not Estab. %   Basos 1 Not Estab. %   Neutrophils Absolute 4.5 1.4 - 7.0 x10E3/uL   Lymphocytes Absolute 3.9 (H) 0.7 - 3.1 x10E3/uL   Monocytes Absolute 0.8 0.1 - 0.9 x10E3/uL   EOS (ABSOLUTE) 0.4 0.0 - 0.4 x10E3/uL   Basophils Absolute 0.1 0.0 - 0.2 x10E3/uL   Immature Granulocytes 0 Not Estab. %   Immature Grans (Abs) 0.0 0.0 - 0.1 x10E3/uL  ToxASSURE Select 13 (MW), Urine  Result Value Ref Range   Summary Note   Lipid panel  Result Value Ref Range   Cholesterol, Total 234 (H) 100 - 199 mg/dL   Triglycerides 393 (H) 0 - 149 mg/dL   HDL 53 >39 mg/dL   VLDL Cholesterol  Cal 68 (H) 5 - 40 mg/dL   LDL Chol Calc (NIH) 113 (H) 0 - 99 mg/dL   Chol/HDL Ratio 4.4 0.0 - 4.4 ratio  TSH  Result Value Ref Range   TSH 4.590 (H) 0.450 - 4.500 uIU/mL       Pertinent labs & imaging results that were available during my care of the patient were reviewed by me and considered in my medical decision making.  Assessment & Plan:  Takarra was seen today for referral.  Diagnoses and all orders for this visit:  Seborrheic keratoses Multiple SKs noted to back. Dermatologist changed practices and pt needs new referral placed.  -     Ambulatory referral to Dermatology  AK (actinic keratosis) -     Ambulatory referral to  Dermatology     Continue all other maintenance medications.  Follow up plan: Return if symptoms worsen or fail to improve.   Continue healthy lifestyle choices, including diet (rich in fruits, vegetables, and lean proteins, and low in salt and simple carbohydrates) and exercise (at least 30 minutes of moderate physical activity daily).  Educational handout given for SK  The above assessment and management plan was discussed with the patient. The patient verbalized understanding of and has agreed to the management plan. Patient is aware to call the clinic if they develop any new symptoms or if symptoms persist or worsen. Patient is aware when to return to the clinic for a follow-up visit. Patient educated on when it is appropriate to go to the emergency department.   Monia Pouch, FNP-C [facility] [phone]

## 2022-11-10 ENCOUNTER — Telehealth: Payer: Self-pay | Admitting: Family

## 2022-11-10 NOTE — Telephone Encounter (Signed)
Patient called [facility] and they did not receive the referral that was sent, said they have had issues with fax. Please resend and also send to [email or web address] if possible.

## 2022-11-26 DIAGNOSIS — L821 Other seborrheic keratosis: Secondary | ICD-10-CM | POA: Diagnosis not present

## 2022-11-26 DIAGNOSIS — L820 Inflamed seborrheic keratosis: Secondary | ICD-10-CM | POA: Diagnosis not present

## 2022-11-26 DIAGNOSIS — L568 Other specified acute skin changes due to ultraviolet radiation: Secondary | ICD-10-CM | POA: Diagnosis not present

## 2022-11-26 DIAGNOSIS — D485 Neoplasm of uncertain behavior of skin: Secondary | ICD-10-CM | POA: Diagnosis not present

## 2022-11-26 DIAGNOSIS — Z85828 Personal history of other malignant neoplasm of skin: Secondary | ICD-10-CM | POA: Diagnosis not present

## 2022-11-26 DIAGNOSIS — D1801 Hemangioma of skin and subcutaneous tissue: Secondary | ICD-10-CM | POA: Diagnosis not present

## 2022-11-26 DIAGNOSIS — Z08 Encounter for follow-up examination after completed treatment for malignant neoplasm: Secondary | ICD-10-CM | POA: Diagnosis not present

## 2022-11-26 DIAGNOSIS — Z1283 Encounter for screening for malignant neoplasm of skin: Secondary | ICD-10-CM | POA: Diagnosis not present

## 2022-12-16 ENCOUNTER — Other Ambulatory Visit: Payer: Self-pay | Admitting: Family

## 2022-12-16 DIAGNOSIS — I10 Essential (primary) hypertension: Secondary | ICD-10-CM

## 2022-12-30 ENCOUNTER — Other Ambulatory Visit: Payer: Self-pay | Admitting: Family

## 2022-12-30 DIAGNOSIS — K219 Gastro-esophageal reflux disease without esophagitis: Secondary | ICD-10-CM

## 2023-01-28 ENCOUNTER — Encounter: Payer: Self-pay | Admitting: Family Medicine

## 2023-01-28 ENCOUNTER — Ambulatory Visit (INDEPENDENT_AMBULATORY_CARE_PROVIDER_SITE_OTHER): Payer: Medicare HMO | Admitting: Family Medicine

## 2023-01-28 VITALS — BP 122/74 | HR 81 | Temp 97.3°F | Ht 61.0 in | Wt 182.2 lb

## 2023-01-28 DIAGNOSIS — J0140 Acute pansinusitis, unspecified: Secondary | ICD-10-CM

## 2023-01-28 MED ORDER — METHYLPREDNISOLONE ACETATE 40 MG/ML IJ SUSP
40.0000 mg | Freq: Once | INTRAMUSCULAR | Status: AC
  Administered 2023-01-28: 60 mg via INTRAMUSCULAR

## 2023-01-28 MED ORDER — AMOXICILLIN 500 MG PO CAPS: 500.0000 mg | ORAL_CAPSULE | Freq: Two times a day (BID) | ORAL | 0 refills | Status: AC

## 2023-01-28 NOTE — Progress Notes (Signed)
Acute Office Visit  Subjective:     Patient ID: Susan Contreras, female    DOB: 03-Oct-1940, 82 y.o.   MRN: [id number]  Chief Complaint  Patient presents with   Ear Pain   Cough   Nasal Congestion   Fatigue    X 2 weeks- has been using old amoxicillin that she had - took 8 pills. Using Tylenol & norel     Pt presents today with complaints of ongoing cheek and forehead pressure/pain, ear fullness and pain, cough, rhinorrhea, and post nasal drainage. States she has been using Flonase and Norel with some relief of symptoms. States she had some left over Amoxicillin and took those, unsure the amount she took. States this was left over from a dental procedure. No fever or chills. No weakness or confusion.   Cough The current episode started 1 to 4 weeks ago. The problem has been waxing and waning. The cough is Productive of purulent sputum. Associated symptoms include chills, ear congestion, ear pain, headaches, nasal congestion, postnasal drip and rhinorrhea. Pertinent negatives include no chest pain, eye redness, fever, heartburn, hemoptysis, myalgias, rash, sore throat, shortness of breath, sweats, weight loss or wheezing. Nothing aggravates the symptoms. She has tried OTC cough suppressant for the symptoms. The treatment provided mild relief.     Review of Systems  Constitutional:  Positive for chills. Negative for diaphoresis, fever, malaise/fatigue and weight loss.  HENT:  Positive for congestion, ear pain, postnasal drip, rhinorrhea and sinus pain. Negative for ear discharge, hearing loss, nosebleeds, sore throat and tinnitus.   Eyes:  Negative for blurred vision, double vision, photophobia, pain, discharge and redness.  Respiratory:  Positive for cough and sputum production. Negative for hemoptysis, shortness of breath, wheezing and stridor.   Cardiovascular:  Negative for chest pain, palpitations, orthopnea, claudication, leg swelling and PND.  Gastrointestinal:  Negative for  heartburn.  Musculoskeletal:  Negative for myalgias.  Skin:  Negative for rash.  Neurological:  Positive for headaches. Negative for dizziness, tingling, tremors, sensory change, speech change, focal weakness, seizures, loss of consciousness and weakness.        Objective:    BP 122/74   Pulse 81   Temp (!) 97.3 F (36.3 C) (Temporal)   Ht 5\' 1"  (1.549 m)   Wt 182 lb 3.2 oz (82.6 kg)   SpO2 96%   BMI 34.43 kg/m    Physical Exam Vitals and nursing note reviewed.  Constitutional:      General: She is not in acute distress.    Appearance: Normal appearance. She is well-developed and well-groomed. She is obese. She is not ill-appearing, toxic-appearing or diaphoretic.  HENT:     Head: Normocephalic and atraumatic.     Jaw: There is normal jaw occlusion.     Right Ear: Hearing normal. A middle ear effusion is present. Tympanic membrane is not erythematous.     Left Ear: Hearing normal. A middle ear effusion is present. Tympanic membrane is not erythematous.     Nose: Congestion present.     Right Turbinates: Enlarged.     Left Turbinates: Enlarged.     Right Sinus: Maxillary sinus tenderness and frontal sinus tenderness present.     Left Sinus: Maxillary sinus tenderness and frontal sinus tenderness present.     Mouth/Throat:     Lips: Pink.     Mouth: Mucous membranes are moist.     Pharynx: Oropharynx is clear. Uvula midline. Posterior oropharyngeal erythema present. No pharyngeal swelling, oropharyngeal exudate or  uvula swelling.  Eyes:     General: Lids are normal. Allergic shiner present.     Extraocular Movements: Extraocular movements intact.     Conjunctiva/sclera: Conjunctivae normal.     Pupils: Pupils are equal, round, and reactive to light.  Neck:     Thyroid: No thyroid mass, thyromegaly or thyroid tenderness.     Vascular: No carotid bruit or JVD.     Trachea: Trachea and phonation normal.  Cardiovascular:     Rate and Rhythm: Normal rate and regular rhythm.      Chest Wall: PMI is not displaced.     Pulses: Normal pulses.     Heart sounds: Normal heart sounds. No murmur heard.    No friction rub. No gallop.  Pulmonary:     Effort: Pulmonary effort is normal. No respiratory distress.     Breath sounds: Normal breath sounds. No wheezing.  Abdominal:     General: Bowel sounds are normal. There is no distension or abdominal bruit.     Palpations: Abdomen is soft. There is no hepatomegaly or splenomegaly.     Tenderness: There is no abdominal tenderness. There is no right CVA tenderness or left CVA tenderness.     Hernia: No hernia is present.  Musculoskeletal:     Cervical back: Normal range of motion and neck supple.     Right lower leg: No edema.     Left lower leg: No edema.  Lymphadenopathy:     Cervical: No cervical adenopathy.  Skin:    General: Skin is warm and dry.     Capillary Refill: Capillary refill takes less than 2 seconds.     Coloration: Skin is not cyanotic, jaundiced or pale.     Findings: No rash.  Neurological:     General: No focal deficit present.     Mental Status: She is alert and oriented to person, place, and time.     Sensory: Sensation is intact.     Motor: Motor function is intact.     Coordination: Coordination is intact.     Gait: Gait is intact.     Deep Tendon Reflexes: Reflexes are normal and symmetric.  Psychiatric:        Attention and Perception: Attention and perception normal.        Mood and Affect: Mood and affect normal.        Speech: Speech normal.        Behavior: Behavior normal. Behavior is cooperative.        Thought Content: Thought content normal.        Cognition and Memory: Cognition and memory normal.        Judgment: Judgment normal.        Assessment & Plan:   Problem List Items Addressed This Visit   None Visit Diagnoses     Acute non-recurrent pansinusitis    -  Primary Has tried and failed symptomatic care at home. Educated on proper antibiotic use to prevent  antibiotic resistance. Continue Flonase and Norel. Will burst with steroids to help with congestion and mid ear effusions. Start below and complete full course of therapy. Report new, worsening, or persistent symptoms.    Relevant Medications   amoxicillin (AMOXIL) 500 MG capsule       Meds ordered this encounter  Medications   amoxicillin (AMOXIL) 500 MG capsule    Sig: Take 1 capsule (500 mg total) by mouth 2 (two) times daily for 10 days.    Dispense:  20 capsule    Refill:  0    Order Specific Question:   Supervising Provider    Answer:   Mechele Claude [[id number]]    Return if symptoms worsen or fail to improve.  Kari Baars, FNP-C [facility] [address] [place], [place] [address] [phone]

## 2023-02-10 ENCOUNTER — Encounter: Payer: Self-pay | Admitting: Family

## 2023-02-10 ENCOUNTER — Ambulatory Visit (INDEPENDENT_AMBULATORY_CARE_PROVIDER_SITE_OTHER): Payer: Medicare HMO | Admitting: Family

## 2023-02-10 VITALS — BP 129/79 | HR 72 | Temp 97.1°F | Ht 61.0 in | Wt 182.2 lb

## 2023-02-10 DIAGNOSIS — I159 Secondary hypertension, unspecified: Secondary | ICD-10-CM | POA: Diagnosis not present

## 2023-02-10 DIAGNOSIS — G4700 Insomnia, unspecified: Secondary | ICD-10-CM | POA: Diagnosis not present

## 2023-02-10 DIAGNOSIS — I129 Hypertensive chronic kidney disease with stage 1 through stage 4 chronic kidney disease, or unspecified chronic kidney disease: Secondary | ICD-10-CM | POA: Diagnosis not present

## 2023-02-10 DIAGNOSIS — E785 Hyperlipidemia, unspecified: Secondary | ICD-10-CM | POA: Diagnosis not present

## 2023-02-10 DIAGNOSIS — F411 Generalized anxiety disorder: Secondary | ICD-10-CM

## 2023-02-10 DIAGNOSIS — N1832 Chronic kidney disease, stage 3b: Secondary | ICD-10-CM

## 2023-02-10 DIAGNOSIS — M8580 Other specified disorders of bone density and structure, unspecified site: Secondary | ICD-10-CM

## 2023-02-10 DIAGNOSIS — K219 Gastro-esophageal reflux disease without esophagitis: Secondary | ICD-10-CM | POA: Diagnosis not present

## 2023-02-10 DIAGNOSIS — F1320 Sedative, hypnotic or anxiolytic dependence, uncomplicated: Secondary | ICD-10-CM

## 2023-02-10 DIAGNOSIS — Z79899 Other long term (current) drug therapy: Secondary | ICD-10-CM | POA: Diagnosis not present

## 2023-02-10 DIAGNOSIS — E669 Obesity, unspecified: Secondary | ICD-10-CM

## 2023-02-10 DIAGNOSIS — E039 Hypothyroidism, unspecified: Secondary | ICD-10-CM | POA: Diagnosis not present

## 2023-02-10 DIAGNOSIS — R946 Abnormal results of thyroid function studies: Secondary | ICD-10-CM | POA: Diagnosis not present

## 2023-02-10 DIAGNOSIS — E559 Vitamin D deficiency, unspecified: Secondary | ICD-10-CM

## 2023-02-10 LAB — CMP14+EGFR
ALT: 11 IU/L (ref 0–32)
AST: 20 IU/L (ref 0–40)
Albumin: 4.7 g/dL (ref 3.8–4.8)
Alkaline Phosphatase: 95 IU/L (ref 44–121)
BUN/Creatinine Ratio: 15 (ref 12–28)
BUN: 22 mg/dL (ref 8–27)
Bilirubin Total: 0.7 mg/dL (ref 0.0–1.2)
CO2: 21 mmol/L (ref 20–29)
Calcium: 9.6 mg/dL (ref 8.7–10.3)
Chloride: 101 mmol/L (ref 96–106)
Creatinine, Ser: 1.51 mg/dL — ABNORMAL HIGH (ref 0.57–1.00)
Globulin, Total: 3 g/dL (ref 1.5–4.5)
Glucose: 89 mg/dL (ref 70–99)
Potassium: 5.3 mmol/L — ABNORMAL HIGH (ref 3.5–5.2)
Sodium: 137 mmol/L (ref 134–144)
Total Protein: 7.7 g/dL (ref 6.0–8.5)
eGFR: 35 mL/min/{1.73_m2} — ABNORMAL LOW (ref >59)

## 2023-02-10 MED ORDER — ALPRAZOLAM 0.25 MG PO TABS: ORAL_TABLET | ORAL | 2 refills | Status: DC

## 2023-02-10 NOTE — Progress Notes (Signed)
Subjective:    Patient ID: Susan Contreras, female    DOB: 03/09/1941, 82 y.o.   MRN: [id number]  Chief Complaint  Patient presents with   Medical Management of Chronic Issues   Pt calls the office today for chronic follow up. She is followed by GI as needed. She has CKD and avoid NSAID's.    She is obese with a BMI of 34 with HTN and GERD.    Complaining of sinus congestion and headache. Has been taking amoxicillin.  Hypertension This is a chronic problem. The current episode started more than 1 year ago. The problem has been waxing and waning since onset. The problem is uncontrolled. Associated symptoms include anxiety. Pertinent negatives include no malaise/fatigue, peripheral edema or shortness of breath. Risk factors for coronary artery disease include diabetes mellitus, dyslipidemia, obesity and sedentary lifestyle. The current treatment provides moderate improvement. Identifiable causes of hypertension include a thyroid problem.  Gastroesophageal Reflux She complains of belching, heartburn and a hoarse voice. This is a chronic problem. The current episode started more than 1 year ago. The problem occurs occasionally. Associated symptoms include fatigue. Risk factors include obesity. She has tried a PPI for the symptoms. The treatment provided moderate relief.  Thyroid Problem Presents for follow-up visit. Symptoms include anxiety, constipation, depressed mood, fatigue and hoarse voice. Patient reports no diarrhea or dry skin. The symptoms have been stable. Her past medical history is significant for hyperlipidemia.  Insomnia Primary symptoms: difficulty falling asleep, frequent awakening, no malaise/fatigue.   The onset quality is gradual. The problem occurs intermittently. Past treatments include medication. The treatment provided moderate relief.  Hyperlipidemia This is a chronic problem. The current episode started more than 1 year ago. The problem is controlled. Exacerbating  diseases include obesity. Pertinent negatives include no shortness of breath. Current antihyperlipidemic treatment includes statins. The current treatment provides moderate improvement of lipids. Risk factors for coronary artery disease include dyslipidemia, hypertension, a sedentary lifestyle and post-menopausal.  Anxiety Presents for follow-up visit. Symptoms include depressed mood, excessive worry, insomnia, nervous/anxious behavior and restlessness. Patient reports no shortness of breath. Symptoms occur most days. The severity of symptoms is moderate.        Review of Systems  Constitutional:  Positive for fatigue. Negative for malaise/fatigue.  HENT:  Positive for hoarse voice.   Respiratory:  Negative for shortness of breath.   Gastrointestinal:  Positive for constipation and heartburn. Negative for diarrhea.  Psychiatric/Behavioral:  The patient is nervous/anxious and has insomnia.   All other systems reviewed and are negative.      Objective:   Physical Exam Vitals reviewed.  Constitutional:      General: She is not in acute distress.    Appearance: She is well-developed.  HENT:     Head: Normocephalic and atraumatic.     Right Ear: External ear normal.  Eyes:     Pupils: Pupils are equal, round, and reactive to light.  Neck:     Thyroid: No thyromegaly.  Cardiovascular:     Rate and Rhythm: Normal rate and regular rhythm.     Heart sounds: Normal heart sounds. No murmur heard. Pulmonary:     Effort: Pulmonary effort is normal. No respiratory distress.     Breath sounds: Normal breath sounds. No wheezing.  Abdominal:     General: Bowel sounds are normal. There is no distension.     Palpations: Abdomen is soft.     Tenderness: There is no abdominal tenderness.  Musculoskeletal:  General: No tenderness. Normal range of motion.     Cervical back: Normal range of motion and neck supple.  Skin:    General: Skin is warm and dry.  Neurological:     Mental  Status: She is alert and oriented to person, place, and time.     Cranial Nerves: No cranial nerve deficit.     Deep Tendon Reflexes: Reflexes are normal and symmetric.  Psychiatric:        Behavior: Behavior normal.        Thought Content: Thought content normal.        Judgment: Judgment normal.       BP 129/79   Pulse 72   Temp (!) 97.1 F (36.2 C) (Temporal)   Ht 5\' 1"  (1.549 m)   Wt 182 lb 3.2 oz (82.6 kg)   SpO2 98%   BMI 34.43 kg/m      Assessment & Plan:   Susan Contreras comes in today with chief complaint of Medical Management of Chronic Issues   Diagnosis and orders addressed:  1. GAD (generalized anxiety disorder) - ALPRAZolam (XANAX) 0.25 MG tablet; TAKE 1 TABLET TWICE DAILY AS NEEDED FOR ANXIETY  Dispense: 60 tablet; Refill: 2 - CMP14+EGFR  2. Benzodiazepine dependence (HCC) - ALPRAZolam (XANAX) 0.25 MG tablet; TAKE 1 TABLET TWICE DAILY AS NEEDED FOR ANXIETY  Dispense: 60 tablet; Refill: 2 - CMP14+EGFR  3. Controlled substance agreement signed - ALPRAZolam (XANAX) 0.25 MG tablet; TAKE 1 TABLET TWICE DAILY AS NEEDED FOR ANXIETY  Dispense: 60 tablet; Refill: 2 - CMP14+EGFR  4. Stage 3b chronic kidney disease (HCC)  - CMP14+EGFR  5. Gastroesophageal reflux disease, unspecified whether esophagitis present - CMP14+EGFR  6. Hyperlipidemia, unspecified hyperlipidemia type - CMP14+EGFR  7. Secondary hypertension - CMP14+EGFR  8. Hypothyroidism, unspecified type - CMP14+EGFR  9. Insomnia, unspecified type - CMP14+EGFR  10. Vitamin D deficiency - CMP14+EGFR  11. Obesity (BMI 30.0-34.9)  - CMP14+EGFR  12. Osteopenia, unspecified location - CMP14+EGFR   Labs pending Patient reviewed in [place] controlled database, no flags noted. Contract and drug screen are up to date.  Health Maintenance reviewed Diet and exercise encouraged  Follow up plan: 3 months    Jannifer Rodney, FNP

## 2023-02-10 NOTE — Patient Instructions (Signed)
Health Maintenance After Age 80 After age 80, you are at a higher risk for certain long-term diseases and infections as well as injuries from falls. Falls are a major cause of broken bones and head injuries in people who are older than age 80. Getting regular preventive care can help to keep you healthy and well. Preventive care includes getting regular testing and making lifestyle changes as recommended by your health care provider. Talk with your health care provider about: Which screenings and tests you should have. A screening is a test that checks for a disease when you have no symptoms. A diet and exercise plan that is right for you. What should I know about screenings and tests to prevent falls? Screening and testing are the best ways to find a health problem early. Early diagnosis and treatment give you the best chance of managing medical conditions that are common after age 80. Certain conditions and lifestyle choices may make you more likely to have a fall. Your health care provider may recommend: Regular vision checks. Poor vision and conditions such as cataracts can make you more likely to have a fall. If you wear glasses, make sure to get your prescription updated if your vision changes. Medicine review. Work with your health care provider to regularly review all of the medicines you are taking, including over-the-counter medicines. Ask your health care provider about any side effects that may make you more likely to have a fall. Tell your health care provider if any medicines that you take make you feel dizzy or sleepy. Strength and balance checks. Your health care provider may recommend certain tests to check your strength and balance while standing, walking, or changing positions. Foot health exam. Foot pain and numbness, as well as not wearing proper footwear, can make you more likely to have a fall. Screenings, including: Osteoporosis screening. Osteoporosis is a condition that causes  the bones to get weaker and break more easily. Blood pressure screening. Blood pressure changes and medicines to control blood pressure can make you feel dizzy. Depression screening. You may be more likely to have a fall if you have a fear of falling, feel depressed, or feel unable to do activities that you used to do. Alcohol use screening. Using too much alcohol can affect your balance and may make you more likely to have a fall. Follow these instructions at home: Lifestyle Do not drink alcohol if: Your health care provider tells you not to drink. If you drink alcohol: Limit how much you have to: 0-1 drink a day for women. 0-2 drinks a day for men. Know how much alcohol is in your drink. In the [place] one drink equals one 12 oz bottle of beer (355 mL), one 5 oz glass of wine (148 mL), or one 1 oz glass of hard liquor (44 mL). Do not use any products that contain nicotine or tobacco. These products include cigarettes, chewing tobacco, and vaping devices, such as e-cigarettes. If you need help quitting, ask your health care provider. Activity  Follow a regular exercise program to stay fit. This will help you maintain your balance. Ask your health care provider what types of exercise are appropriate for you. If you need a cane or walker, use it as recommended by your health care provider. Wear supportive shoes that have nonskid soles. Safety  Remove any tripping hazards, such as rugs, cords, and clutter. Install safety equipment such as grab bars in bathrooms and safety rails on stairs. Keep rooms and walkways   well-lit. General instructions Talk with your health care provider about your risks for falling. Tell your health care provider if: You fall. Be sure to tell your health care provider about all falls, even ones that seem minor. You feel dizzy, tiredness (fatigue), or off-balance. Take over-the-counter and prescription medicines only as told by your health care provider. These include  supplements. Eat a healthy diet and maintain a healthy weight. A healthy diet includes low-fat dairy products, low-fat (lean) meats, and fiber from whole grains, beans, and lots of fruits and vegetables. Stay current with your vaccines. Schedule regular health, dental, and eye exams. Summary Having a healthy lifestyle and getting preventive care can help to protect your health and wellness after age 80. Screening and testing are the best way to find a health problem early and help you avoid having a fall. Early diagnosis and treatment give you the best chance for managing medical conditions that are more common for people who are older than age 80. Falls are a major cause of broken bones and head injuries in people who are older than age 80. Take precautions to prevent a fall at home. Work with your health care provider to learn what changes you can make to improve your health and wellness and to prevent falls. This information is not intended to replace advice given to you by your health care provider. Make sure you discuss any questions you have with your health care provider. Document Revised: 12/31/2020 Document Reviewed: 12/31/2020 Elsevier Patient Education  2024 [employer] Inc.  

## 2023-02-14 LAB — TSH: TSH: 3.95 u[IU]/mL (ref 0.450–4.500)

## 2023-02-14 LAB — SPECIMEN STATUS REPORT

## 2023-03-02 ENCOUNTER — Encounter: Payer: Self-pay | Admitting: Nurse Practitioner

## 2023-03-02 ENCOUNTER — Ambulatory Visit: Payer: Medicare HMO | Admitting: Nurse Practitioner

## 2023-03-02 VITALS — BP 124/70 | HR 84 | Ht 61.0 in | Wt 183.6 lb

## 2023-03-02 DIAGNOSIS — E785 Hyperlipidemia, unspecified: Secondary | ICD-10-CM

## 2023-03-02 DIAGNOSIS — I10 Essential (primary) hypertension: Secondary | ICD-10-CM | POA: Diagnosis not present

## 2023-03-02 DIAGNOSIS — E669 Obesity, unspecified: Secondary | ICD-10-CM

## 2023-03-02 DIAGNOSIS — N1832 Chronic kidney disease, stage 3b: Secondary | ICD-10-CM | POA: Diagnosis not present

## 2023-03-02 NOTE — Patient Instructions (Addendum)
Medication Instructions:  Your physician recommends that you continue on your current medications as directed. Please refer to the Current Medication list given to you today.  Labwork: none  Testing/Procedures: none  Follow-Up: Your physician recommends that you schedule a follow-up appointment in: 1 Year with [facility]  Any Other Special Instructions Will Be Listed Below (If Applicable).  If you need a refill on your cardiac medications before your next appointment, please call your pharmacy.

## 2023-03-02 NOTE — Addendum Note (Signed)
Addended by: Sharen Hones on: 03/02/2023 03:11 PM   Modules accepted: Orders

## 2023-03-02 NOTE — Progress Notes (Addendum)
  Cardiology Office Note:  .   Date:  03/02/2023  ID:  Susan Contreras, DOB [birth year], MRN [id number] PCP: Junie Spencer, FNP  [facility] Providers Cardiologist:  Dina Rich, MD    History of Present Illness: .   Susan Contreras is a 82 y.o. female with a PMH of chest pain, HLD, HTN, generalized fatigue/weight gain, hypothyroidism, CKD stage 3b, and GERD/PUD, who presents today for scheduled follow-up.   Last seen by Dr. Dina Rich on Jan 15, 2022. Admitted to atypical epigastric pain, weight gain, and generalized fatigue, was deferred to PCP as not a cardiac issue.   Today she presents for follow-up. She states she is doing well. Denies any chest pain, shortness of breath, palpitations, syncope, presyncope, dizziness, orthopnea, PND, swelling or significant weight changes, acute bleeding, or claudication. Tolerating her medications well.   Studies Reviewed: Marland Kitchen    MUSE EKG unavailable at time of the appointment.  Addendum (03/16/2023): EKG on 03/16/2023 reveals NSR, 83 bpm, no acute ischemic changes.  Physical Exam:   VS:  BP 124/70   Pulse 84   Ht 5\' 1"  (1.549 m)   Wt 183 lb 9.6 oz (83.3 kg)   SpO2 97%   BMI 34.69 kg/m    Wt Readings from Last 3 Encounters:  03/02/23 183 lb 9.6 oz (83.3 kg)  02/10/23 182 lb 3.2 oz (82.6 kg)  01/28/23 182 lb 3.2 oz (82.6 kg)    GEN: Well nourished, well developed in no acute distress NECK: No JVD; No carotid bruits CARDIAC: S1/S2, RRR, no murmurs, rubs, gallops RESPIRATORY:  Clear to auscultation without rales, wheezing or rhonchi  ABDOMEN: Soft, non-tender, non-distended EXTREMITIES:  No edema; No deformity   ASSESSMENT AND PLAN: .    HTN BP stable. Continue irbesartan. Discussed to monitor BP at home at least 2 hours after medications and sitting for 5-10 minutes.  HLD LDL 113 09/2022. Currently being managed by PCP. Continue atorvastatin. Heart healthy diet and regular cardiovascular exercise encouraged.   CKD stage  3b Most recent sCr 01/2023 was 1.51 with eGFR at 35. Avoid nephrotoxic agents. Continue to follow with PCP.  4. Obesity Weight loss via diet and exercise encouraged. Discussed the impact being overweight would have on cardiovascular risk.  Dispo: Follow-up with Dr. Dina Rich or APP in 1 year or sooner if anything changes.   Signed, Sharlene Dory, NP

## 2023-03-16 ENCOUNTER — Ambulatory Visit: Payer: Medicare HMO | Attending: Cardiology

## 2023-03-16 DIAGNOSIS — I10 Essential (primary) hypertension: Secondary | ICD-10-CM

## 2023-03-16 NOTE — Progress Notes (Signed)
Patient presents to have EKG redone Per Sharlene Dory

## 2023-03-17 ENCOUNTER — Other Ambulatory Visit: Payer: Self-pay | Admitting: Family

## 2023-03-17 DIAGNOSIS — I10 Essential (primary) hypertension: Secondary | ICD-10-CM

## 2023-03-20 ENCOUNTER — Telehealth: Payer: Medicare HMO | Admitting: Family Medicine

## 2023-03-20 ENCOUNTER — Encounter: Payer: Self-pay | Admitting: Family Medicine

## 2023-03-20 DIAGNOSIS — J0111 Acute recurrent frontal sinusitis: Secondary | ICD-10-CM

## 2023-03-20 MED ORDER — AMOXICILLIN-POT CLAVULANATE 875-125 MG PO TABS: 1.0000 | ORAL_TABLET | Freq: Two times a day (BID) | ORAL | 0 refills | Status: DC

## 2023-03-20 MED ORDER — AMOXICILLIN 500 MG PO CAPS: 500.0000 mg | ORAL_CAPSULE | Freq: Two times a day (BID) | ORAL | 0 refills | Status: DC

## 2023-03-20 MED ORDER — PREDNISONE 20 MG PO TABS: ORAL_TABLET | ORAL | 0 refills | Status: DC

## 2023-03-20 NOTE — Progress Notes (Signed)
Virtual Visit via MyChart video note  I connected with Susan Contreras on 03/20/23 at 1023 by video and verified that I am speaking with the correct person using two identifiers. Susan Contreras is currently located at home and patient are currently with her during visit. The provider, Elige Radon Riyad Keena, MD is located in their office at time of visit.  Call ended at 1039  I discussed the limitations, risks, security and privacy concerns of performing an evaluation and management service by video and the availability of in person appointments. I also discussed with the patient that there may be a patient responsible charge related to this service. The patient expressed understanding and agreed to proceed.   History and Present Illness: Patient is calling in for sinus congestion.  She takes zyrtec and does norel ad and has not been doing well.  She has sinus and nasal pressure and ear pressure that has picked up recently.  She has dry cough.  She felt feverish last night.  She has been doing this for 1 week. She fights this off and on frequently.   1. Acute recurrent frontal sinusitis     Outpatient Encounter Medications as of 03/20/2023  Medication Sig   amoxicillin-clavulanate (AUGMENTIN) 875-125 MG tablet Take 1 tablet by mouth 2 (two) times daily.   predniSONE (DELTASONE) 20 MG tablet 2 po at same time daily for 5 days   ALPRAZolam (XANAX) 0.25 MG tablet TAKE 1 TABLET TWICE DAILY AS NEEDED FOR ANXIETY   atorvastatin (LIPITOR) 20 MG tablet Take 1 tablet (20 mg total) by mouth every other day.   cetirizine (ZYRTEC) 10 MG tablet Take 1 tablet (10 mg total) by mouth daily.   Chlorphen-PE-Acetaminophen (NOREL AD PO) Take 1 tablet by mouth as needed.    Cholecalciferol (VITAMIN D3) 125 MCG (5000 UT) CAPS Take 1 capsule (5,000 Units total) by mouth daily.   famotidine (PEPCID) 40 MG tablet Take 1 tablet (40 mg total) by mouth daily.   fluticasone (FLONASE) 50 MCG/ACT nasal spray Place 1 spray into  both nostrils daily.   irbesartan (AVAPRO) 75 MG tablet TAKE ONE TABLET ONCE DAILY   levothyroxine (SYNTHROID) 50 MCG tablet TAKE ONE TABLET BY MOUTH ONCE DAILY   meclizine (ANTIVERT) 25 MG tablet TAKE ONE TABLET BY MOUTH THREE TIMES DAILY AS NEEDED FOR DIZZINESS   pantoprazole (PROTONIX) 20 MG tablet TAKE ONE TABLET TWICE DAILY BEFORE MEALS   No facility-administered encounter medications on file as of 03/20/2023.    Review of Systems  Constitutional:  Positive for fever. Negative for chills.  HENT:  Positive for congestion, ear pain, postnasal drip, rhinorrhea and sinus pressure. Negative for ear discharge, sneezing and sore throat.   Eyes:  Negative for pain, redness and visual disturbance.  Respiratory:  Positive for cough. Negative for chest tightness and shortness of breath.   Cardiovascular:  Negative for chest pain and leg swelling.  Genitourinary:  Negative for difficulty urinating and dysuria.  Musculoskeletal:  Negative for back pain and gait problem.  Skin:  Negative for rash.  Neurological:  Negative for light-headedness and headaches.  Psychiatric/Behavioral:  Negative for agitation and behavioral problems.   All other systems reviewed and are negative.   Observations/Objective: Patient sounds comfortable and in no acute distress  Assessment and Plan: Problem List Items Addressed This Visit   None Visit Diagnoses     Acute recurrent frontal sinusitis    -  Primary   Relevant Medications   amoxicillin-clavulanate (AUGMENTIN) 875-125 MG tablet  predniSONE (DELTASONE) 20 MG tablet       Will treat like sinus infection and continue with flonase.  Follow up plan: Return if symptoms worsen or fail to improve.     I discussed the assessment and treatment plan with the patient. The patient was provided an opportunity to ask questions and all were answered. The patient agreed with the plan and demonstrated an understanding of the instructions.   The patient was  advised to call back or seek an in-person evaluation if the symptoms worsen or if the condition fails to improve as anticipated.  The above assessment and management plan was discussed with the patient. The patient verbalized understanding of and has agreed to the management plan. Patient is aware to call the clinic if symptoms persist or worsen. Patient is aware when to return to the clinic for a follow-up visit. Patient educated on when it is appropriate to go to the emergency department.    I provided 16 minutes of non-face-to-face time during this encounter.    Nils Pyle, MD

## 2023-03-20 NOTE — Addendum Note (Signed)
Addended by: Arville Care on: 03/20/2023 10:40 AM   Modules accepted: Orders

## 2023-04-07 ENCOUNTER — Other Ambulatory Visit: Payer: Self-pay | Admitting: Family

## 2023-04-07 DIAGNOSIS — K219 Gastro-esophageal reflux disease without esophagitis: Secondary | ICD-10-CM

## 2023-04-28 ENCOUNTER — Encounter: Payer: Self-pay | Admitting: Family Medicine

## 2023-04-28 ENCOUNTER — Ambulatory Visit (INDEPENDENT_AMBULATORY_CARE_PROVIDER_SITE_OTHER): Payer: Medicare HMO | Admitting: Family Medicine

## 2023-04-28 VITALS — BP 124/67 | HR 82 | Temp 97.9°F | Ht 61.0 in | Wt 188.8 lb

## 2023-04-28 DIAGNOSIS — R0981 Nasal congestion: Secondary | ICD-10-CM | POA: Diagnosis not present

## 2023-04-28 DIAGNOSIS — J0111 Acute recurrent frontal sinusitis: Secondary | ICD-10-CM | POA: Diagnosis not present

## 2023-04-28 DIAGNOSIS — R058 Other specified cough: Secondary | ICD-10-CM | POA: Diagnosis not present

## 2023-04-28 MED ORDER — AMOXICILLIN 500 MG PO CAPS: 500.0000 mg | ORAL_CAPSULE | Freq: Three times a day (TID) | ORAL | 0 refills | Status: DC

## 2023-04-28 NOTE — Progress Notes (Signed)
Chief Complaint  Patient presents with   Headache   Sinusitis   Ear Pain   Eye Pain   Fatigue    HPI  Patient presents today for Symptoms include congestion, facial pain, nasal congestion, non productive cough, post nasal drip and sinus pressure. There is no fever, chills, or sweats. Onset of symptoms was a few days ago, gradually worsening since that time. Can only tolerate amoxil. No other abx.   PMH: Smoking status noted ROS: Per HPI  Objective: BP 124/67   Pulse 82   Temp 97.9 F (36.6 C)   Ht 5\' 1"  (1.549 m)   Wt 188 lb 12.8 oz (85.6 kg)   SpO2 96%   BMI 35.67 kg/m  Gen: NAD, alert, cooperative with exam HEENT: NCAT, EOMI, PERRL Tms clear frontal sinus on left tender. Also left max tenderness.  CV: RRR, good S1/S2, no murmur Resp: CTABL, no wheezes, non-labored Abd: SNTND, BS present, no guarding or organomegaly Ext: No edema, warm Neuro: Alert and oriented, No gross deficits  Assessment and plan:  1. Congestion of nasal sinus   2. Acute recurrent frontal sinusitis     Meds ordered this encounter  Medications   amoxicillin (AMOXIL) 500 MG capsule    Sig: Take 1 capsule (500 mg total) by mouth 3 (three) times daily.    Dispense:  30 capsule    Refill:  0    Orders Placed This Encounter  Procedures   COVID-19, Flu A+B and RSV    Order Specific Question:   Previously tested for COVID-19    Answer:   Unknown    Order Specific Question:   Resident in a congregate (group) care setting    Answer:   Unknown    Order Specific Question:   Is the patient student?    Answer:   No    Order Specific Question:   Employed in healthcare setting    Answer:   Unknown    Order Specific Question:   Pregnant    Answer:   No    Order Specific Question:   Has patient completed COVID vaccination(s) (2 doses of Pfizer/Moderna 1 dose of [employer])    Answer:   Unknown    Follow up as needed.  Mechele Claude, MD

## 2023-04-29 LAB — COVID-19, FLU A+B AND RSV
Influenza A, NAA: NOT DETECTED
Influenza B, NAA: NOT DETECTED
RSV, NAA: NOT DETECTED
SARS-CoV-2, NAA: NOT DETECTED

## 2023-05-11 ENCOUNTER — Other Ambulatory Visit: Payer: Self-pay | Admitting: Family

## 2023-06-15 ENCOUNTER — Other Ambulatory Visit: Payer: Self-pay | Admitting: Family

## 2023-06-15 DIAGNOSIS — E039 Hypothyroidism, unspecified: Secondary | ICD-10-CM

## 2023-06-15 DIAGNOSIS — J3089 Other allergic rhinitis: Secondary | ICD-10-CM

## 2023-06-26 DIAGNOSIS — H40013 Open angle with borderline findings, low risk, bilateral: Secondary | ICD-10-CM | POA: Diagnosis not present

## 2023-07-06 ENCOUNTER — Other Ambulatory Visit: Payer: Self-pay | Admitting: Family

## 2023-07-06 DIAGNOSIS — K219 Gastro-esophageal reflux disease without esophagitis: Secondary | ICD-10-CM

## 2023-07-08 ENCOUNTER — Encounter: Payer: Self-pay | Admitting: Family Medicine

## 2023-07-08 ENCOUNTER — Ambulatory Visit: Payer: Medicare HMO | Admitting: Family Medicine

## 2023-07-08 VITALS — BP 118/62 | HR 72 | Temp 98.5°F | Ht 61.0 in | Wt 188.0 lb

## 2023-07-08 DIAGNOSIS — J0111 Acute recurrent frontal sinusitis: Secondary | ICD-10-CM | POA: Diagnosis not present

## 2023-07-08 MED ORDER — PREDNISONE 20 MG PO TABS: 40.0000 mg | ORAL_TABLET | Freq: Every day | ORAL | 0 refills | Status: AC

## 2023-07-08 MED ORDER — AMOXICILLIN 500 MG PO CAPS: 500.0000 mg | ORAL_CAPSULE | Freq: Two times a day (BID) | ORAL | 0 refills | Status: AC

## 2023-07-08 NOTE — Progress Notes (Signed)
Acute Office Visit  Subjective:     Patient ID: Susan Contreras, female    DOB: 1940/11/16, 82 y.o.   MRN: [id number]  Chief Complaint  Patient presents with   Sinusitis    Sinusitis This is a new problem. Episode onset: 1 week. The problem has been gradually worsening since onset. There has been no fever. Associated symptoms include congestion, coughing, ear pain, headaches, sinus pressure and a sore throat. Pertinent negatives include no chills, shortness of breath, sneezing or swollen glands. Treatments tried: norel AD, flonase. The treatment provided no relief.  Hx of recurrent sinusitis. Only tolerates amoxicillin.   Review of Systems  Constitutional:  Negative for chills.  HENT:  Positive for congestion, ear pain, sinus pressure and sore throat. Negative for sneezing.   Respiratory:  Positive for cough. Negative for shortness of breath.   Neurological:  Positive for headaches.        Objective:    BP 118/62   Pulse 72   Temp 98.5 F (36.9 C) (Temporal)   Ht 5\' 1"  (1.549 m)   Wt 188 lb (85.3 kg)   SpO2 96%   BMI 35.52 kg/m    Physical Exam Vitals and nursing note reviewed.  Constitutional:      General: She is not in acute distress.    Appearance: She is not ill-appearing, toxic-appearing or diaphoretic.  HENT:     Head: Normocephalic and atraumatic.     Right Ear: Ear canal and external ear normal. A middle ear effusion is present. Tympanic membrane is not perforated, erythematous, retracted or bulging.     Left Ear: Ear canal and external ear normal. A middle ear effusion is present. Tympanic membrane is not perforated, erythematous, retracted or bulging.     Nose: Congestion present.     Right Sinus: Frontal sinus tenderness present. No maxillary sinus tenderness.     Left Sinus: Frontal sinus tenderness present. No maxillary sinus tenderness.     Mouth/Throat:     Mouth: Mucous membranes are moist.     Pharynx: Uvula midline. Posterior oropharyngeal  erythema present. No pharyngeal swelling or oropharyngeal exudate.     Tonsils: No tonsillar exudate.  Eyes:     General:        Right eye: No discharge.        Left eye: No discharge.     Conjunctiva/sclera: Conjunctivae normal.  Cardiovascular:     Rate and Rhythm: Normal rate and regular rhythm.     Heart sounds: Normal heart sounds. No murmur heard. Pulmonary:     Effort: Pulmonary effort is normal.     Breath sounds: Normal breath sounds.  Musculoskeletal:     Cervical back: Neck supple. No rigidity.     Right lower leg: No edema.     Left lower leg: No edema.  Lymphadenopathy:     Cervical: No cervical adenopathy.  Skin:    General: Skin is warm and dry.  Neurological:     General: No focal deficit present.     Mental Status: She is alert and oriented to person, place, and time.  Psychiatric:        Mood and Affect: Mood normal.        Behavior: Behavior normal.     No results found for any visits on 07/08/23.      Assessment & Plan:   [name] was seen today for sinusitis.  Diagnoses and all orders for this visit:  Acute recurrent frontal sinusitis Amoxicillin as  below with prednisone burst. Return to office for new or worsening symptoms, or if symptoms persist.  -     amoxicillin (AMOXIL) 500 MG capsule; Take 1 capsule (500 mg total) by mouth 2 (two) times daily for 7 days. -     predniSONE (DELTASONE) 20 MG tablet; Take 2 tablets (40 mg total) by mouth daily with breakfast for 5 days.  The patient indicates understanding of these issues and agrees with the plan.   Gabriel Earing, FNP

## 2023-07-21 NOTE — Telephone Encounter (Signed)
Copied from CRM [id number]. Topic: Clinical - Medication Refill >> Jul 21, 2023  8:15 AM Susan Contreras wrote: Reason for CRM: PT states that the 7 day course of amoxicillin from [facility] has not been enough to clear her sinus/cold issues. PT is requesting a re fill of amoxicillin as she still feels very ill.

## 2023-07-22 ENCOUNTER — Encounter: Payer: Self-pay | Admitting: Family Medicine

## 2023-07-22 ENCOUNTER — Ambulatory Visit (INDEPENDENT_AMBULATORY_CARE_PROVIDER_SITE_OTHER): Payer: Medicare HMO | Admitting: Family Medicine

## 2023-07-22 ENCOUNTER — Telehealth: Payer: Self-pay | Admitting: Family Medicine

## 2023-07-22 VITALS — BP 128/71 | HR 87 | Temp 97.5°F | Ht 61.0 in | Wt 187.2 lb

## 2023-07-22 DIAGNOSIS — J40 Bronchitis, not specified as acute or chronic: Secondary | ICD-10-CM | POA: Diagnosis not present

## 2023-07-22 DIAGNOSIS — J329 Chronic sinusitis, unspecified: Secondary | ICD-10-CM | POA: Diagnosis not present

## 2023-07-22 MED ORDER — PREDNISONE 20 MG PO TABS: 40.0000 mg | ORAL_TABLET | Freq: Every day | ORAL | 0 refills | Status: DC

## 2023-07-22 MED ORDER — MOXIFLOXACIN HCL 400 MG PO TABS: 400.0000 mg | ORAL_TABLET | Freq: Every day | ORAL | 0 refills | Status: DC

## 2023-07-22 MED ORDER — AMOXICILLIN-POT CLAVULANATE 875-125 MG PO TABS: 1.0000 | ORAL_TABLET | Freq: Two times a day (BID) | ORAL | 0 refills | Status: DC

## 2023-07-22 NOTE — Telephone Encounter (Signed)
Line busy x 2

## 2023-07-22 NOTE — Telephone Encounter (Signed)
Copied from CRM [id number]. Topic: Clinical - Prescription Issue >> Jul 22, 2023  1:46 PM Mosetta Putt H wrote: Reason for CRM: patient did not pick up the medicine moxifloxacin (AVELOX) 400 MG tablet because insurance did not cover it and It was $45 and she has never took it before

## 2023-07-22 NOTE — Progress Notes (Signed)
No chief complaint on file.   HPI  Patient presents today for Symptoms include congestion, facial pain, nasal congestion, non productive cough, post nasal drip and sinus pressure. There is no fever, chills, or sweats. Onset of symptoms was a few days ago, gradually worsening since that time. Tired a lot. Throat scratchy. Ears hurt. Malaise. Has allergies too.    PMH: Smoking status noted ROS: Per HPI  Objective: BP 128/71   Pulse 87   Temp (!) 97.5 F (36.4 C)   Ht 5\' 1"  (1.549 m)   Wt 187 lb 3.2 oz (84.9 kg)   SpO2 99%   BMI 35.37 kg/m  Gen: NAD, alert, cooperative with exam HEENT: NCAT, EOMI, PERRL CV: RRR, good S1/S2, no murmur Resp: CTABL, no wheezes, non-labored Abd: SNTND, BS present, no guarding or organomegaly Ext: No edema, warm Neuro: Alert and oriented, No gross deficits  Assessment and plan:  1. Sinobronchitis     Meds ordered this encounter  Medications   predniSONE (DELTASONE) 20 MG tablet    Sig: Take 2 tablets (40 mg total) by mouth daily with breakfast. Take 5 daily for 2 days followed by 4,3,2 and 1 for 2 days each.    Dispense:  20 tablet    Refill:  0   DISCONTD: moxifloxacin (AVELOX) 400 MG tablet    Sig: Take 1 tablet (400 mg total) by mouth daily.    Dispense:  10 tablet    Refill:  0   amoxicillin-clavulanate (AUGMENTIN) 875-125 MG tablet    Sig: Take 1 tablet by mouth 2 (two) times daily. Take all of this medication    Dispense:  20 tablet    Refill:  0    No orders of the defined types were placed in this encounter.   Follow up as needed.  Mechele Claude, MD

## 2023-07-22 NOTE — Telephone Encounter (Signed)
I AM SENDING IN A SUBSTITUTE

## 2023-07-28 ENCOUNTER — Other Ambulatory Visit: Payer: Self-pay | Admitting: Family Medicine

## 2023-07-28 ENCOUNTER — Telehealth: Payer: Self-pay | Admitting: Family Medicine

## 2023-07-28 MED ORDER — AMOXICILLIN 500 MG PO TABS: 500.0000 mg | ORAL_TABLET | Freq: Two times a day (BID) | ORAL | 0 refills | Status: AC

## 2023-07-28 NOTE — Telephone Encounter (Signed)
Please let the patient know that I sent their prescription to their pharmacy. Thanks, WS 

## 2023-07-28 NOTE — Telephone Encounter (Signed)
Copied from CRM [id number]. Topic: Clinical - Prescription Issue >> Jul 28, 2023  9:34 AM Gaetano Hawthorne wrote: Reason for CRM: Patient is calling to request a different antibotic/medication- she does not do well on Augmentin - the pills are also very large- she is very used to just taking plain amoxicillin - she can be reached at [phone]

## 2023-08-03 ENCOUNTER — Other Ambulatory Visit: Payer: Self-pay | Admitting: Family

## 2023-08-03 DIAGNOSIS — K219 Gastro-esophageal reflux disease without esophagitis: Secondary | ICD-10-CM

## 2023-08-03 MED ORDER — PANTOPRAZOLE SODIUM 20 MG PO TBEC: 20.0000 mg | DELAYED_RELEASE_TABLET | Freq: Two times a day (BID) | ORAL | 0 refills | Status: DC

## 2023-08-03 NOTE — Telephone Encounter (Signed)
[facility] pt NTBS 30-d given 07/07/23

## 2023-08-03 NOTE — Addendum Note (Signed)
Addended by: Julious Payer D on: 08/03/2023 11:49 AM   Modules accepted: Orders

## 2023-08-03 NOTE — Telephone Encounter (Signed)
Pt will be out this week and asking for a refill. Pt scheduled apt for 08/10/2023 with Christy.

## 2023-08-07 ENCOUNTER — Ambulatory Visit: Payer: Self-pay | Admitting: Family

## 2023-08-07 NOTE — Telephone Encounter (Signed)
Copied from CRM [id number]. Topic: Clinical - Red Word Triage >> Aug 07, 2023  1:29 PM Dimitri Ped wrote: Red Word that prompted transfer to Nurse Triage: patient is calling back . No one has reached out to her concerning the acid reflux . Spoke with triage nurse and I did read the notes but patient is still complaining about blood pressure.   Chief Complaint:  Patient was calling back for second time today because of  concerns about her blood pressure 80/55 but reports now it  is better. She realized she hadn't taken her medication .Currently her Blood Pressure 127/73. Patient very concern about her acid reflux.  She takes Protonix 20 mg twice per day. She is requesting to  increase the dosage to take it bid or  same  dose  and take it three time per day  to see if it  will help with her reflux.   Patient describes  discomfort as 8  on scale 1-10  when it is occurring.  She has been taking pepto -bismol. She mentions she is drinking  it. Then the pain goes away. She denies any sour taste when belching  Upon review of her chart she is currently on an antibiotic and it ends today but she says she will have a few more days to complete.  Report sweating at night.  Symptoms: She describes starting  at her breast bone to her rib cage  up her trhoat and under her ears. Doing a lot of belching,  Rates  pain  8 on scale 1-10  when it is occurring.  Also mentions sweating at night when it occurs.  Disposition: [] ED /[] Urgent Care (no appt availability in office) / [] Appointment(In office/virtual)/ []  [facility]/ [] Home Care/ [] Refused Recommended Disposition /[] [facility]/ [x]  Follow-up with PCP- Appointment is already scheduled for this upcoming Monday 08/10/2023 Additional Notes: Patient has a history of hernia as well. She believes it may be the problem.  Patient encouraged to call back if needed . Provided after hours Urgent care is and option .  Patient verbalized  understanding.  Reason for Disposition  [1] Chest pain(s) lasting a few seconds AND [2] persists > 3 days  Answer Assessment - Initial Assessment Questions 1. LOCATION: "Where does it hurt?"  It is at her breast bone to her rib cage  up her trhoat and under her ears      2. RADIATION: "Does the pain go anywhere else?" (e.g., into neck, jaw, arms, back)     Hurting mostly in the front. 3. ONSET: "When did the chest pain begin?" (Minutes, hours or days)       Indigestion last night but took Tums abut it used to help 4. PATTERN: "Does the pain come and go, or has it been constant since it started?"  "Does it get worse with exertion?"      It is really bad  . Had a virus on Wednesday and had to stay in the bed all day. Yesterday she was sweating. 5. DURATION: "How long does it last" (e.g., seconds, minutes, hours)      It last until I take something for  it. I have been using Pepto bismol 6. SEVERITY: "How bad is the pain?"  (e.g., Scale 1-10; mild, moderate, or severe) rates pain  is 8 .     - SEVERE (8-10): excruciating pain, unable to do any normal activities       7. CARDIAC RISK FACTORS: "Do you  have any history of heart problems or risk factors for heart disease?" (e.g., angina, prior heart attack; diabetes, high blood pressure, high cholesterol, smoker, or strong family history of heart disease)      Only Cancer. 8. PULMONARY RISK FACTORS: "Do you have any history of lung disease?"  (e.g., blood clots in lung, asthma, emphysema, birth control pills)      Denies 9. CAUSE: "What do you think is causing the chest pain?"      Acid reflux 10. OTHER SYMPTOMS: "Do you have any other symptoms?" (e.g., dizziness, nausea, vomiting, sweating, fever, difficulty breathing, cough)       Sweating  but it got better once she drank ed her Pepto bismol  Protocols used: Chest Pain-A-AH

## 2023-08-07 NOTE — Telephone Encounter (Addendum)
Copied from CRM [id number]. Topic: Clinical - Medical Advice >> Aug 07, 2023 10:14 AM Hector Shade B wrote: Reason for CRM: patient called in asking if there's going around she's been with a virus going, she also states that her acid reflux is extremely bad.  The diarrhea is something else she would like to know if she should double up on medications or not.  Chief Complaint: Chest pain Symptoms: acid reflux per patient Frequency: one episode this morning lasting 30 minutes Pertinent Negatives: Patient denies nausea/vomiting,  Disposition: [] ED /[] Urgent Care (no appt availability in office) / [] Appointment(In office/virtual)/ []  [facility]/ [] Home Care/ [] Refused Recommended Disposition /[] [facility]/ [x]  Follow-up with PCP Additional Notes: Patient called and advised many concerns.  Patient would like to speak with her provider to ask about if she can double up on her acid reflux medication.  She states that she feels fine now and acid reflux medication or pepto bismol helps.  She states she drank some coffee this morning and she had pain midsternal into her right rib cage.  She states she had a possibly virus recently and she doesn't know if she was left with this pain or not.  She denies any pain now but just doesn't want this to come back so she was wondering about doubling up on her acid reflux medication.  Patient states she will be fine and now she is having the "tail end" of the but wants to talk to her doctor about it. Patient is advised that if anything happens or worsens with her chest she will get it checked out sooner.  Patient also spoke about having the procedure to stretch her esophagus and many colonoscopies.  Patient is advised with any chest pain it is better to be safe then sorry but she denied wanting to go to the ER or an urgent care.  Patient verbalizes understanding that she will go if it gets worse she will get it checked out sooner but she mainly just wanted to  talk to the doctor about if she can double her medications.  Reason for Disposition  [1] Patient says chest pain feels exactly the same as previously diagnosed "heartburn" AND [2] describes burning in chest AND [3] accompanying sour taste in mouth  Answer Assessment - Initial Assessment Questions 1. LOCATION: "Where does it hurt?"       midsternal 2. RADIATION: "Does the pain go anywhere else?" (e.g., into neck, jaw, arms, back)     Right rib cage 3. ONSET: "When did the chest pain begin?" (Minutes, hours or days)      This morning per patient 4. PATTERN: "Does the pain come and go, or has it been constant since it started?"  "Does it get worse with exertion?"      Patient states one episode earlier this morning that lasted for 30 minutes of what she states as acid reflux pain 5. DURATION: "How long does it last" (e.g., seconds, minutes, hours)     30 min earlier today 6. SEVERITY: "How bad is the pain?"  (e.g., Scale 1-10; mild, moderate, or severe)    - MILD (1-3): doesn't interfere with normal activities     - MODERATE (4-7): interferes with normal activities or awakens from sleep    - SEVERE (8-10): excruciating pain, unable to do any normal activities       Mild  "A little achy in the rib cage on the right" 7. CARDIAC RISK FACTORS: "Do you have any history  of heart problems or risk factors for heart disease?" (e.g., angina, prior heart attack; diabetes, high blood pressure, high cholesterol, smoker, or strong family history of heart disease)     Patient denies but she said she is on cholesterol medicine that she takes sometimes 8. PULMONARY RISK FACTORS: "Do you have any history of lung disease?"  (e.g., blood clots in lung, asthma, emphysema, birth control pills)     No 9. CAUSE: "What do you think is causing the chest pain?"     Acid Reflux possibly or the small hernia in that area or patient believes she has a virus 10. OTHER SYMPTOMS: "Do you have any other symptoms?" (e.g.,  dizziness, nausea, vomiting, sweating, fever, difficulty breathing, cough)       Malaise, rib pain under the right breast, pain in the sternal area, patient states she was sweating yesterday and this morning when she had pain  Protocols used: Chest Pain-A-AH

## 2023-08-10 ENCOUNTER — Encounter: Payer: Self-pay | Admitting: Family

## 2023-08-10 ENCOUNTER — Ambulatory Visit (INDEPENDENT_AMBULATORY_CARE_PROVIDER_SITE_OTHER): Payer: Medicare HMO | Admitting: Family

## 2023-08-10 VITALS — BP 117/72 | HR 93 | Temp 97.2°F | Ht 61.0 in | Wt 189.0 lb

## 2023-08-10 DIAGNOSIS — E039 Hypothyroidism, unspecified: Secondary | ICD-10-CM

## 2023-08-10 DIAGNOSIS — Z79899 Other long term (current) drug therapy: Secondary | ICD-10-CM

## 2023-08-10 DIAGNOSIS — N1832 Chronic kidney disease, stage 3b: Secondary | ICD-10-CM | POA: Diagnosis not present

## 2023-08-10 DIAGNOSIS — E785 Hyperlipidemia, unspecified: Secondary | ICD-10-CM | POA: Diagnosis not present

## 2023-08-10 DIAGNOSIS — G4700 Insomnia, unspecified: Secondary | ICD-10-CM

## 2023-08-10 DIAGNOSIS — F1320 Sedative, hypnotic or anxiolytic dependence, uncomplicated: Secondary | ICD-10-CM

## 2023-08-10 DIAGNOSIS — F411 Generalized anxiety disorder: Secondary | ICD-10-CM

## 2023-08-10 DIAGNOSIS — E559 Vitamin D deficiency, unspecified: Secondary | ICD-10-CM

## 2023-08-10 DIAGNOSIS — K219 Gastro-esophageal reflux disease without esophagitis: Secondary | ICD-10-CM

## 2023-08-10 DIAGNOSIS — I159 Secondary hypertension, unspecified: Secondary | ICD-10-CM | POA: Diagnosis not present

## 2023-08-10 MED ORDER — FAMOTIDINE 40 MG PO TABS: 40.0000 mg | ORAL_TABLET | Freq: Every day | ORAL | 1 refills | Status: DC

## 2023-08-10 MED ORDER — PANTOPRAZOLE SODIUM 20 MG PO TBEC: 20.0000 mg | DELAYED_RELEASE_TABLET | Freq: Two times a day (BID) | ORAL | 2 refills | Status: DC

## 2023-08-10 MED ORDER — ALPRAZOLAM 0.25 MG PO TABS: ORAL_TABLET | ORAL | 2 refills | Status: DC

## 2023-08-10 NOTE — Progress Notes (Signed)
Subjective:    Patient ID: Susan Contreras, female    DOB: [birth year], 82 y.o.   MRN: [id number]  Chief Complaint  Patient presents with   Medical Management of Chronic Issues   Extremity Weakness   Pt calls the office today for chronic follow up. She is followed by GI as needed. She has CKD and avoid NSAID's.    She is morbid obese with a BMI of 35 with HTN and GERD.    Complaining of sinus congestion, headache, and weakness.   Extremity Weakness   Hypertension This is a chronic problem. The current episode started more than 1 year ago. The problem has been resolved since onset. The problem is controlled. Associated symptoms include anxiety and malaise/fatigue. Pertinent negatives include no peripheral edema or shortness of breath. Risk factors for coronary artery disease include dyslipidemia and sedentary lifestyle. The current treatment provides moderate improvement. Identifiable causes of hypertension include a thyroid problem.  Gastroesophageal Reflux She complains of belching and heartburn. This is a chronic problem. The current episode started more than 1 year ago. The problem occurs occasionally. The symptoms are aggravated by medications. Associated symptoms include fatigue. Risk factors include obesity. She has tried a PPI for the symptoms. The treatment provided moderate relief.  Thyroid Problem Presents for follow-up visit. Symptoms include constipation and fatigue. Patient reports no anxiety, depressed mood or dry skin. The symptoms have been stable. Her past medical history is significant for hyperlipidemia.  Insomnia Primary symptoms: sleep disturbance, difficulty falling asleep, malaise/fatigue.   The current episode started more than one year. The problem occurs intermittently. Past treatments include medication. The treatment provided moderate relief.  Hyperlipidemia This is a chronic problem. The current episode started more than 1 year ago. Exacerbating diseases include  obesity. Pertinent negatives include no shortness of breath. Current antihyperlipidemic treatment includes statins. The current treatment provides moderate improvement of lipids. Risk factors for coronary artery disease include dyslipidemia, hypertension, a sedentary lifestyle and post-menopausal.  Anxiety Presents for follow-up visit. Symptoms include excessive worry, insomnia, irritability and restlessness. Patient reports no depressed mood, nervous/anxious behavior or shortness of breath. Symptoms occur occasionally. The severity of symptoms is moderate.        Review of Systems  Constitutional:  Positive for fatigue, irritability and malaise/fatigue.  Respiratory:  Negative for shortness of breath.   Gastrointestinal:  Positive for constipation and heartburn.  Musculoskeletal:  Positive for extremity weakness.  Psychiatric/Behavioral:  Positive for sleep disturbance. The patient has insomnia. The patient is not nervous/anxious.   All other systems reviewed and are negative.      Objective:   Physical Exam Vitals reviewed.  Constitutional:      General: She is not in acute distress.    Appearance: She is well-developed. She is obese.  HENT:     Head: Normocephalic and atraumatic.     Right Ear: Tympanic membrane normal.     Left Ear: Tympanic membrane normal.  Eyes:     Pupils: Pupils are equal, round, and reactive to light.  Neck:     Thyroid: No thyromegaly.  Cardiovascular:     Rate and Rhythm: Normal rate and regular rhythm.     Heart sounds: Normal heart sounds. No murmur heard. Pulmonary:     Effort: Pulmonary effort is normal. No respiratory distress.     Breath sounds: Normal breath sounds. No wheezing.  Abdominal:     General: Bowel sounds are normal. There is no distension.     Palpations: Abdomen is  soft.     Tenderness: There is no abdominal tenderness.  Musculoskeletal:        General: No tenderness. Normal range of motion.     Cervical back: Normal range  of motion and neck supple.  Skin:    General: Skin is warm and dry.  Neurological:     Mental Status: She is alert and oriented to person, place, and time.     Cranial Nerves: No cranial nerve deficit.     Deep Tendon Reflexes: Reflexes are normal and symmetric.  Psychiatric:        Behavior: Behavior normal.        Thought Content: Thought content normal.        Judgment: Judgment normal.       BP 117/72   Pulse 93   Temp (!) 97.2 F (36.2 C) (Temporal)   Ht 5\' 1"  (1.549 m)   Wt 189 lb (85.7 kg)   SpO2 97%   BMI 35.71 kg/m      Assessment & Plan:   [name] comes in today with chief complaint of Medical Management of Chronic Issues and Extremity Weakness   Diagnosis and orders addressed:  1. GAD (generalized anxiety disorder) - ALPRAZolam (XANAX) 0.25 MG tablet; TAKE 1 TABLET TWICE DAILY AS NEEDED FOR ANXIETY  Dispense: 60 tablet; Refill: 2  2. Benzodiazepine dependence (HCC) - ALPRAZolam (XANAX) 0.25 MG tablet; TAKE 1 TABLET TWICE DAILY AS NEEDED FOR ANXIETY  Dispense: 60 tablet; Refill: 2  3. Controlled substance agreement signed - ALPRAZolam (XANAX) 0.25 MG tablet; TAKE 1 TABLET TWICE DAILY AS NEEDED FOR ANXIETY  Dispense: 60 tablet; Refill: 2  4. Stage 3b chronic kidney disease (HCC) (Primary)  5. Gastroesophageal reflux disease, unspecified whether esophagitis present - pantoprazole (PROTONIX) 20 MG tablet; Take 1 tablet (20 mg total) by mouth 2 (two) times daily before a meal.  Dispense: 180 tablet; Refill: 2 Ok to take Pepcid -Diet discussed- Avoid fried, spicy, citrus foods, caffeine and alcohol -Do not eat 2-3 hours before bedtime -Encouraged small frequent meals -Avoid NSAID's  6. Hyperlipidemia, unspecified hyperlipidemia type  7. Secondary hypertension  8. Hypothyroidism, unspecified type   9. Insomnia, unspecified type  10. Morbid obesity (HCC)  11. Vitamin D deficiency   Pt wants to hold off on labs today.  Patient reviewed in [place]  controlled database, no flags noted. Contract and drug screen are up to date.  Continue current medications  Health Maintenance reviewed- refuses all vaccines today Diet and exercise encouraged  Follow up plan: 3 months    Susan Rodney, FNP

## 2023-08-10 NOTE — Telephone Encounter (Signed)
Pt was seen in office today

## 2023-08-10 NOTE — Patient Instructions (Signed)
Health Maintenance After Age 80 After age 80, you are at a higher risk for certain long-term diseases and infections as well as injuries from falls. Falls are a major cause of broken bones and head injuries in people who are older than age 80. Getting regular preventive care can help to keep you healthy and well. Preventive care includes getting regular testing and making lifestyle changes as recommended by your health care provider. Talk with your health care provider about: Which screenings and tests you should have. A screening is a test that checks for a disease when you have no symptoms. A diet and exercise plan that is right for you. What should I know about screenings and tests to prevent falls? Screening and testing are the best ways to find a health problem early. Early diagnosis and treatment give you the best chance of managing medical conditions that are common after age 80. Certain conditions and lifestyle choices may make you more likely to have a fall. Your health care provider may recommend: Regular vision checks. Poor vision and conditions such as cataracts can make you more likely to have a fall. If you wear glasses, make sure to get your prescription updated if your vision changes. Medicine review. Work with your health care provider to regularly review all of the medicines you are taking, including over-the-counter medicines. Ask your health care provider about any side effects that may make you more likely to have a fall. Tell your health care provider if any medicines that you take make you feel dizzy or sleepy. Strength and balance checks. Your health care provider may recommend certain tests to check your strength and balance while standing, walking, or changing positions. Foot health exam. Foot pain and numbness, as well as not wearing proper footwear, can make you more likely to have a fall. Screenings, including: Osteoporosis screening. Osteoporosis is a condition that causes  the bones to get weaker and break more easily. Blood pressure screening. Blood pressure changes and medicines to control blood pressure can make you feel dizzy. Depression screening. You may be more likely to have a fall if you have a fear of falling, feel depressed, or feel unable to do activities that you used to do. Alcohol use screening. Using too much alcohol can affect your balance and may make you more likely to have a fall. Follow these instructions at home: Lifestyle Do not drink alcohol if: Your health care provider tells you not to drink. If you drink alcohol: Limit how much you have to: 0-1 drink a day for women. 0-2 drinks a day for men. Know how much alcohol is in your drink. In the [place] one drink equals one 12 oz bottle of beer (355 mL), one 5 oz glass of wine (148 mL), or one 1 oz glass of hard liquor (44 mL). Do not use any products that contain nicotine or tobacco. These products include cigarettes, chewing tobacco, and vaping devices, such as e-cigarettes. If you need help quitting, ask your health care provider. Activity  Follow a regular exercise program to stay fit. This will help you maintain your balance. Ask your health care provider what types of exercise are appropriate for you. If you need a cane or walker, use it as recommended by your health care provider. Wear supportive shoes that have nonskid soles. Safety  Remove any tripping hazards, such as rugs, cords, and clutter. Install safety equipment such as grab bars in bathrooms and safety rails on stairs. Keep rooms and walkways   well-lit. General instructions Talk with your health care provider about your risks for falling. Tell your health care provider if: You fall. Be sure to tell your health care provider about all falls, even ones that seem minor. You feel dizzy, tiredness (fatigue), or off-balance. Take over-the-counter and prescription medicines only as told by your health care provider. These include  supplements. Eat a healthy diet and maintain a healthy weight. A healthy diet includes low-fat dairy products, low-fat (lean) meats, and fiber from whole grains, beans, and lots of fruits and vegetables. Stay current with your vaccines. Schedule regular health, dental, and eye exams. Summary Having a healthy lifestyle and getting preventive care can help to protect your health and wellness after age 80. Screening and testing are the best way to find a health problem early and help you avoid having a fall. Early diagnosis and treatment give you the best chance for managing medical conditions that are more common for people who are older than age 80. Falls are a major cause of broken bones and head injuries in people who are older than age 80. Take precautions to prevent a fall at home. Work with your health care provider to learn what changes you can make to improve your health and wellness and to prevent falls. This information is not intended to replace advice given to you by your health care provider. Make sure you discuss any questions you have with your health care provider. Document Revised: 12/31/2020 Document Reviewed: 12/31/2020 Elsevier Patient Education  2024 [employer] Inc.  

## 2023-09-14 ENCOUNTER — Other Ambulatory Visit: Payer: Self-pay | Admitting: Family

## 2023-09-14 DIAGNOSIS — I10 Essential (primary) hypertension: Secondary | ICD-10-CM

## 2023-09-28 ENCOUNTER — Telehealth: Payer: Self-pay | Admitting: Family

## 2023-09-28 NOTE — Telephone Encounter (Signed)
LM pt needs to schedule an apt for left foot pain  Copied from CRM [id number]. Topic: General - Call Back - No Documentation >> Sep 28, 2023  1:09 PM Fredrica W wrote: Reason for CRM: Patient states she is returning missed called from this morning. Called [facility]. No nurse has reached out to patient yet and Marian Sorrow currently at lunch. Thank You

## 2023-09-28 NOTE — Telephone Encounter (Signed)
Returned call. NA NVM. Pt will need to schedule an apt.  Copied from CRM [phone]. Topic: General - Other >> Sep 28, 2023 11:05 AM Susan Contreras wrote: Reason for CRM: Received call from pt she declined appt and would like a call back regarding her left foot pain. [phone]

## 2023-09-30 ENCOUNTER — Encounter: Payer: Self-pay | Admitting: Family Medicine

## 2023-09-30 ENCOUNTER — Ambulatory Visit: Payer: Medicare HMO | Admitting: Family Medicine

## 2023-09-30 VITALS — BP 108/69 | HR 81 | Temp 97.8°F | Ht 61.0 in | Wt 188.0 lb

## 2023-09-30 DIAGNOSIS — M79672 Pain in left foot: Secondary | ICD-10-CM | POA: Diagnosis not present

## 2023-09-30 NOTE — Progress Notes (Signed)
 Subjective:  Patient ID: Susan Contreras, female    DOB: 02/22/1941, 83 y.o.   MRN: [id number]  Patient Care Team: [name] LABOR, FNP as PCP - General (Nurse Practitioner) Alvan Dorn FALCON, MD as PCP - Cardiology (Cardiology)   Chief Complaint:  left foot pain (Under heel X 3-4 weeks)  HPI: Susan Contreras is a 83 y.o. female presenting on 09/30/2023 for left foot pain (Under heel X 3-4 weeks) Reports that it started 4-5 weeks ago. States that it is painful under her heel. She is putting OTC creams on it, which seems to be helping some. States that she has appt with Dr. Roddie pending and he has been helpful with her other foot. Patient had leftover prednisone  from a sinus infection that she has been taking the last 6 days. Took the last one yesterday. Has history of CKD. States that it hurts worse in the morning. Denies trauma, fall, erythema. Endorses some slight swelling.   Relevant past medical, surgical, family, and social history reviewed and updated as indicated.  Allergies and medications reviewed and updated. Data reviewed: Chart in Epic.   Past Medical History:  Diagnosis Date   Anxiety    Depression    Diverticulosis    Esophageal stricture    Family hx of colorectal cancer    GERD (gastroesophageal reflux disease)    Helicobacter pylori (H. pylori) 1997   EGD   Hemorrhoids    Hiatal hernia    Hx of adenomatous colonic polyps    Hyperlipemia    Hypertension    PUD (peptic ulcer disease) 1997   EGD   Thyroid  disease    Vertigo     Past Surgical History:  Procedure Laterality Date   ABDOMINAL HYSTERECTOMY     BILATERAL OOPHORECTOMY     CHOLECYSTECTOMY      Social History   Socioeconomic History   Marital status: Married    Spouse name: Not on file   Number of children: 2   Years of education: Not on file   Highest education level: Not on file  Occupational History   Occupation: Retired  Tobacco Use   Smoking status: Never   Smokeless tobacco: Never   Vaping Use   Vaping status: Never Used  Substance and Sexual Activity   Alcohol use: No   Drug use: No   Sexual activity: Not on file  Other Topics Concern   Not on file  Social History Narrative   2 caffeine drinks daily    Social Drivers of Health   Financial Resource Strain: Low Risk  (10/27/2022)   Overall Financial Resource Strain (CARDIA)    Difficulty of Paying Living Expenses: Not hard at all  Food Insecurity: No Food Insecurity (10/27/2022)   Hunger Vital Sign    Worried About Running Out of Food in the Last Year: Never true    Ran Out of Food in the Last Year: Never true  Transportation Needs: No Transportation Needs (10/27/2022)   PRAPARE - Administrator, Civil Service (Medical): No    Lack of Transportation (Non-Medical): No  Physical Activity: Insufficiently Active (10/27/2022)   Exercise Vital Sign    Days of Exercise per Week: 3 days    Minutes of Exercise per Session: 30 min  Stress: No Stress Concern Present (10/27/2022)   [employer] of Occupational Health - Occupational Stress Questionnaire    Feeling of Stress : Only a little  Social Connections: Moderately Integrated (10/27/2022)   Social  Connection and Isolation Panel [NHANES]    Frequency of Communication with Friends and Family: More than three times a week    Frequency of Social Gatherings with Friends and Family: More than three times a week    Attends Religious Services: More than 4 times per year    Active Member of [employer] or Organizations: No    Attends Banker Meetings: Never    Marital Status: Married  Catering Manager Violence: Not At Risk (10/27/2022)   Humiliation, Afraid, Rape, and Kick questionnaire    Fear of Current or Ex-Partner: No    Emotionally Abused: No    Physically Abused: No    Sexually Abused: No    Outpatient Encounter Medications as of 09/30/2023  Medication Sig   ALPRAZolam  (XANAX ) 0.25 MG tablet TAKE 1 TABLET TWICE DAILY AS NEEDED FOR ANXIETY    atorvastatin  (LIPITOR) 20 MG tablet Take 1 tablet (20 mg total) by mouth every other day.   Chlorphen-PE-Acetaminophen (NOREL AD PO) Take 1 tablet by mouth as needed.    Cholecalciferol (VITAMIN D3) 125 MCG (5000 UT) CAPS Take 1 capsule (5,000 Units total) by mouth daily.   famotidine  (PEPCID ) 40 MG tablet Take 1 tablet (40 mg total) by mouth daily.   fluticasone  (FLONASE ) 50 MCG/ACT nasal spray PLACE 1 SPRAY INTO BOTH NOSTRILS DAILY   irbesartan  (AVAPRO ) 75 MG tablet TAKE ONE TABLET ONCE DAILY   levothyroxine  (SYNTHROID ) 50 MCG tablet TAKE ONE TABLET BY MOUTH ONCE DAILY   meclizine  (ANTIVERT ) 25 MG tablet TAKE ONE TABLET THREE TIMES DAILY AS NEEDED FOR DIZZINESS   pantoprazole  (PROTONIX ) 20 MG tablet Take 1 tablet (20 mg total) by mouth 2 (two) times daily before a meal.   No facility-administered encounter medications on file as of 09/30/2023.    Allergies  Allergen Reactions   Biaxin [Clarithromycin]    Cephalexin    Ciprofloxacin    Doxycycline     Headaches   Flagyl [Metronidazole Hcl]    Ketek [Telithromycin]    Sulfa Antibiotics     Review of Systems As per HPI  Objective:  BP 108/69   Pulse 81   Temp 97.8 F (36.6 C)   Ht 5' 1 (1.549 m)   Wt 188 lb (85.3 kg)   SpO2 97%   BMI 35.52 kg/m    Wt Readings from Last 3 Encounters:  09/30/23 188 lb (85.3 kg)  08/10/23 189 lb (85.7 kg)  07/22/23 187 lb 3.2 oz (84.9 kg)   Physical Exam Constitutional:      General: She is awake. She is not in acute distress.    Appearance: Normal appearance. She is well-developed and well-groomed. She is obese. She is not ill-appearing, toxic-appearing or diaphoretic.  Cardiovascular:     Rate and Rhythm: Normal rate and regular rhythm.     Pulses: Normal pulses.          Radial pulses are 2+ on the right side and 2+ on the left side.       Posterior tibial pulses are 2+ on the right side and 2+ on the left side.     Heart sounds: Normal heart sounds. No murmur heard.    No gallop.   Pulmonary:     Effort: Pulmonary effort is normal. No respiratory distress.     Breath sounds: Normal breath sounds. No stridor. No wheezing, rhonchi or rales.  Musculoskeletal:     Cervical back: Full passive range of motion without pain and neck supple.  Right lower leg: No edema.     Left lower leg: No edema.       Feet:  Feet:     Comments: Point tenderness on plantar aspect of foot  Skin:    General: Skin is warm.     Capillary Refill: Capillary refill takes less than 2 seconds.  Neurological:     General: No focal deficit present.     Mental Status: She is alert, oriented to person, place, and time and easily aroused. Mental status is at baseline.     GCS: GCS eye subscore is 4. GCS verbal subscore is 5. GCS motor subscore is 6.     Motor: No weakness.  Psychiatric:        Attention and Perception: Attention and perception normal.        Mood and Affect: Mood and affect normal.        Speech: Speech normal.        Behavior: Behavior normal. Behavior is cooperative.        Thought Content: Thought content normal. Thought content does not include homicidal or suicidal ideation. Thought content does not include homicidal or suicidal plan.        Cognition and Memory: Cognition and memory normal.        Judgment: Judgment normal.     Results for orders placed or performed in visit on 04/28/23  COVID-19, Flu A+B and RSV   Collection Time: 04/28/23  3:41 PM   Specimen: Nasal Swab  Result Value Ref Range   SARS-CoV-2, NAA Not Detected Not Detected   Influenza A, NAA Not Detected Not Detected   Influenza B, NAA Not Detected Not Detected   RSV, NAA Not Detected Not Detected   Test Information: Comment        08/10/2023    9:45 AM 07/22/2023    9:40 AM 04/28/2023   12:05 PM 02/10/2023   10:24 AM 10/27/2022    6:17 PM  Depression screen PHQ 2/9  Decreased Interest 0 0 0  0  Down, Depressed, Hopeless 0 0 0  0  PHQ - 2 Score 0 0 0  0  Altered sleeping 0      Tired,  decreased energy 0      Change in appetite 0      Feeling bad or failure about yourself  0      Trouble concentrating 0      Moving slowly or fidgety/restless 0      Suicidal thoughts 0      PHQ-9 Score 0      Difficult doing work/chores Not difficult at all   Not difficult at all        08/10/2023    9:45 AM 03/10/2022   12:09 PM 01/23/2021   10:50 AM 04/13/2020    2:22 PM  GAD 7 : Generalized Anxiety Score  Nervous, Anxious, on Edge 0 0 1 0  Control/stop worrying 0 0 0 0  Worry too much - different things 0 0 0 0  Trouble relaxing 0 0 1 0  Restless 0 0 0 0  Easily annoyed or irritable 0 0 1 1  Afraid - awful might happen 0 0 0 0  Total GAD 7 Score 0 0 3 1  Anxiety Difficulty Not difficult at all Not difficult at all Not difficult at all Not difficult at all   Pertinent labs & imaging results that were available during my care of the patient were reviewed by me  and considered in my medical decision making.  Assessment & Plan:  Sanii was seen today for left foot pain.  Diagnoses and all orders for this visit:  Left foot pain Encouraged patient to follow up with podiatry, dr. Roddie. Recommend that patient continue stretches, heat, and OTC creams until appt with Dr. Roddie. Did not want to prescribed additional antibiotics in anticipation of steroid injection next week. Given GFR, patient not eligible for NSAID therapy.    Continue all other maintenance medications.  Follow up plan: Return if symptoms worsen or fail to improve.   Continue healthy lifestyle choices, including diet (rich in fruits, vegetables, and lean proteins, and low in salt and simple carbohydrates) and exercise (at least 30 minutes of moderate physical activity daily).  Written and verbal instructions provided   The above assessment and management plan was discussed with the patient. The patient verbalized understanding of and has agreed to the management plan. Patient is aware to call the clinic if they  develop any new symptoms or if symptoms persist or worsen. Patient is aware when to return to the clinic for a follow-up visit. Patient educated on when it is appropriate to go to the emergency department.   Marry Kins, DNP-FNP [facility] [address] [place], [place] [address] [phone]

## 2023-10-08 DIAGNOSIS — M722 Plantar fascial fibromatosis: Secondary | ICD-10-CM | POA: Diagnosis not present

## 2023-10-08 DIAGNOSIS — M79672 Pain in left foot: Secondary | ICD-10-CM | POA: Diagnosis not present

## 2023-10-12 NOTE — Telephone Encounter (Signed)
 Busy x 2.

## 2023-10-12 NOTE — Telephone Encounter (Signed)
Please make patient an appointment. I can be virtually tomorrow if she can do a video.

## 2023-10-12 NOTE — Telephone Encounter (Signed)
Copied from CRM [id number]. Topic: Clinical - Medication Question >> Oct 12, 2023  9:23 AM Geroge Baseman wrote: Reason for CRM: PATIENT REFUSED NURSE TRIAGE Patient saw foot dr, gave her a shot in the foot and it is not working. Left foot has been hurting and can barely walk. She states she has spoken to the office about getting prednisone (?) for her foot. She said she doesn't back to the foot doctor for a shot for 3 weeks and the shots aren't helping. Please call to advise.

## 2023-10-29 DIAGNOSIS — M722 Plantar fascial fibromatosis: Secondary | ICD-10-CM | POA: Diagnosis not present

## 2023-10-29 DIAGNOSIS — M79672 Pain in left foot: Secondary | ICD-10-CM | POA: Diagnosis not present

## 2023-10-30 ENCOUNTER — Other Ambulatory Visit: Payer: Self-pay | Admitting: Family

## 2023-10-30 DIAGNOSIS — E785 Hyperlipidemia, unspecified: Secondary | ICD-10-CM

## 2023-11-17 ENCOUNTER — Encounter: Payer: Self-pay | Admitting: Physical Therapy

## 2023-11-17 ENCOUNTER — Other Ambulatory Visit: Payer: Self-pay

## 2023-11-17 ENCOUNTER — Ambulatory Visit: Attending: Podiatry | Admitting: Physical Therapy

## 2023-11-17 DIAGNOSIS — M25672 Stiffness of left ankle, not elsewhere classified: Secondary | ICD-10-CM | POA: Diagnosis not present

## 2023-11-17 DIAGNOSIS — M79672 Pain in left foot: Secondary | ICD-10-CM | POA: Insufficient documentation

## 2023-11-17 DIAGNOSIS — M62831 Muscle spasm of calf: Secondary | ICD-10-CM | POA: Insufficient documentation

## 2023-11-17 NOTE — Therapy (Signed)
 OUTPATIENT PHYSICAL THERAPY LOWER EXTREMITY EVALUATION   Patient Name: Susan Contreras MRN: [id number] DOB:Jun 18, 1941, 83 y.o., female Today's Date: 11/17/2023  END OF SESSION:  PT End of Session - 11/17/23 1136     Visit Number 1    Number of Visits 12    Date for PT Re-Evaluation 12/29/23    PT Start Time 1101    PT Stop Time 1147    PT Time Calculation (min) 46 min    Activity Tolerance Patient tolerated treatment well    Behavior During Therapy WFL for tasks assessed/performed             Past Medical History:  Diagnosis Date   Anxiety    Depression    Diverticulosis    Esophageal stricture    Family hx of colorectal cancer    GERD (gastroesophageal reflux disease)    Helicobacter pylori (H. pylori) 1997   EGD   Hemorrhoids    Hiatal hernia    Hx of adenomatous colonic polyps    Hyperlipemia    Hypertension    PUD (peptic ulcer disease) 1997   EGD   Thyroid disease    Vertigo    Past Surgical History:  Procedure Laterality Date   ABDOMINAL HYSTERECTOMY     BILATERAL OOPHORECTOMY     CHOLECYSTECTOMY     Patient Active Problem List   Diagnosis Date Noted   Osteopenia 11/04/2021   Chronic kidney disease, stage 3 (HCC) 08/30/2021   Insomnia 05/07/2021   History of colonic polyps 04/26/2020   Benzodiazepine dependence (HCC) 05/19/2018   Controlled substance agreement signed 05/19/2018   Morbid obesity (HCC) 11/26/2017   Metabolic syndrome 09/21/2015   GAD (generalized anxiety disorder) 08/14/2015   Allergic rhinitis 08/14/2015   GERD (gastroesophageal reflux disease) 08/14/2015   Hyperlipidemia 08/14/2015   Vitamin D deficiency 08/14/2015   Hypothyroidism 05/29/2014   Cholelithiasis 01/15/2011   Family history of colon cancer 01/15/2011   Hypertension 11/11/2007   ESOPHAGEAL STRICTURE 11/11/2007   Diaphragmatic hernia 11/11/2007   DIVERTICULOSIS OF COLON 11/11/2007    REFERRING PROVIDER: Adam Phenix DPM  REFERRING DIAG: Left heel pain.  Plantar  fasciitis.    THERAPY DIAG:  Pain in left foot  Stiffness of left ankle, not elsewhere classified  Muscle spasm of calf  Rationale for Evaluation and Treatment: Rehabilitation  ONSET DATE: October, 2024.    SUBJECTIVE:   SUBJECTIVE STATEMENT: The patient presents to the clinic with left heel pain since October of 2024.  She has had two injections but states they did not seem to help a lot.  She reports a pain range of 7-10.  She reports a great deal of pain when she first gets out of bed in the morning.    PERTINENT HISTORY: See above.   PAIN:  Are you having pain? Yes: NPRS scale: 7-10/10. Pain location: Left heel. Pain description: Ache. Aggravating factors: Getting up after sleeping and sitting.   Relieving factors: Nothing really.    PRECAUTIONS: None   WEIGHT BEARING RESTRICTIONS: No  FALLS:  Has patient fallen in last 6 months? No  LIVING ENVIRONMENT: Lives with: lives with their spouse Lives in: House/apartment Has following equipment at home: None  OCCUPATION: Retired.    PLOF: Independent  PATIENT GOALS: Not have pain.     OBJECTIVE:  Note: Objective measures were completed at Evaluation unless otherwise noted.  PATIENT SURVEYS:  LEFS 17/80.    PALPATION: Patient tender to palpation over left medial arch and heel.  She  also c/o pain over her Achilles attachment to the calcaneus and Soleus.    LOWER EXTREMITY ROM:  Active left ankle dorsiflexion is 2 degrees with left knee in full extension and 8 degrees with knee flexed.    LOWER EXTREMITY MMT:  Normal left ankle strength.  GAIT: Slow and purposeful gait pattern.                                                                                                                                  TREATMENT DATE: 11/17/23:   HMP and Pre-mod e'stim at 80-150 Hz x 20 minutes to patient's left foot (plantar surface).  Normal modality response following removal of modality.   PATIENT EDUCATION:   Education details:  Person educated:  International aid/development worker:  Education comprehension:   HOME EXERCISE PROGRAM:   ASSESSMENT:  CLINICAL IMPRESSION: The patient presents to OPPT with c/o left heel pain since October of 2024.  She has a lot of pain when getting up after sleeping and sitting.  She lacks some active left ankle dorsiflexion.  She is tender to palpation over left medial arch and heel.  She also c/o pain over her Achilles attachment to the calcaneus and Soleus.  Her LEFS score is 17/80.  Her gait is slow and purposeful.  Patient is unable to perform ADL's like she wants to due to pain.  Patient will benefit from skilled physical therapy intervention to address pain and deficits.   OBJECTIVE IMPAIRMENTS: Abnormal gait, decreased activity tolerance, decreased ROM, increased muscle spasms, and pain.   ACTIVITY LIMITATIONS: standing and locomotion level  PARTICIPATION LIMITATIONS: meal prep, cleaning, laundry, shopping, community activity, and yard work  PERSONAL FACTORS: Time since onset of injury/illness/exacerbation are also affecting patient's functional outcome.   REHAB POTENTIAL: Good  CLINICAL DECISION MAKING: Stable/uncomplicated  EVALUATION COMPLEXITY: Low   GOALS: LONG TERM GOALS: Target date: 12/29/23.  Ind with a HEP.  Goal status: INITIAL  2.  Increase left ankle dorsiflexion to 8-10 degrees to normalize the patient's gait pattern.  Goal status: INITIAL  3.  Perform ADL's with pain not > 3/10.  Goal status: INITIAL  4.  Out of bed left heel pain-level not > 3/10.  Goal status: INITIAL  5.  Walk a community distance with pain not > 3/10.  Goal status: INITIAL  PLAN:  PT FREQUENCY: 2x/week  PT DURATION: 4 weeks  PLANNED INTERVENTIONS: 97110-Therapeutic exercises, 97530- Therapeutic activity, [id number]- Neuromuscular re-education, 97535- Self Care, [id number]- Manual therapy, [id number]- Electrical stimulation (unattended), 97016- Vasopneumatic device, 97035-  Ultrasound, Patient/Family education, Cryotherapy, and Moist heat  PLAN FOR NEXT SESSION: Combo e'stim/US at 1.50 W/CM2, STW/M (include left calf and Achilles), rockerboard.     Burdette Gergely, [place], PT 11/17/2023, 11:59 AM

## 2023-11-19 ENCOUNTER — Encounter: Payer: Self-pay | Admitting: *Deleted

## 2023-11-19 ENCOUNTER — Ambulatory Visit: Admitting: *Deleted

## 2023-11-19 DIAGNOSIS — M25672 Stiffness of left ankle, not elsewhere classified: Secondary | ICD-10-CM

## 2023-11-19 DIAGNOSIS — M79672 Pain in left foot: Secondary | ICD-10-CM

## 2023-11-19 DIAGNOSIS — M62831 Muscle spasm of calf: Secondary | ICD-10-CM

## 2023-11-19 NOTE — Therapy (Signed)
 OUTPATIENT PHYSICAL THERAPY LOWER EXTREMITY TREATMENT   Patient Name: Susan Contreras MRN: [id number] DOB:09-May-1941, 83 y.o., female Today's Date: 11/19/2023  END OF SESSION:  PT End of Session - 11/19/23 1755     Visit Number 2    Number of Visits 12    Date for PT Re-Evaluation 12/29/23    PT Start Time 1300    PT Stop Time 1350    PT Time Calculation (min) 50 min              Past Medical History:  Diagnosis Date   Anxiety    Depression    Diverticulosis    Esophageal stricture    Family hx of colorectal cancer    GERD (gastroesophageal reflux disease)    Helicobacter pylori (H. pylori) 1997   EGD   Hemorrhoids    Hiatal hernia    Hx of adenomatous colonic polyps    Hyperlipemia    Hypertension    PUD (peptic ulcer disease) 1997   EGD   Thyroid disease    Vertigo    Past Surgical History:  Procedure Laterality Date   ABDOMINAL HYSTERECTOMY     BILATERAL OOPHORECTOMY     CHOLECYSTECTOMY     Patient Active Problem List   Diagnosis Date Noted   Osteopenia 11/04/2021   Chronic kidney disease, stage 3 (HCC) 08/30/2021   Insomnia 05/07/2021   History of colonic polyps 04/26/2020   Benzodiazepine dependence (HCC) 05/19/2018   Controlled substance agreement signed 05/19/2018   Morbid obesity (HCC) 11/26/2017   Metabolic syndrome 09/21/2015   GAD (generalized anxiety disorder) 08/14/2015   Allergic rhinitis 08/14/2015   GERD (gastroesophageal reflux disease) 08/14/2015   Hyperlipidemia 08/14/2015   Vitamin D deficiency 08/14/2015   Hypothyroidism 05/29/2014   Cholelithiasis 01/15/2011   Family history of colon cancer 01/15/2011   Hypertension 11/11/2007   ESOPHAGEAL STRICTURE 11/11/2007   Diaphragmatic hernia 11/11/2007   DIVERTICULOSIS OF COLON 11/11/2007    REFERRING PROVIDER: Adam Phenix DPM  REFERRING DIAG: Left heel pain.  Plantar fasciitis.    THERAPY DIAG:  Pain in left foot  Stiffness of left ankle, not elsewhere classified  Muscle  spasm of calf  Rationale for Evaluation and Treatment: Rehabilitation  ONSET DATE: October, 2024.    SUBJECTIVE:   SUBJECTIVE STATEMENT: The patient  arrives today with LT foot pain 7/10 PERTINENT HISTORY: See above.   PAIN:  Are you having pain? Yes: NPRS scale: 7-10/10. Pain location: Left heel. Pain description: Ache. Aggravating factors: Getting up after sleeping and sitting.   Relieving factors: Nothing really.    PRECAUTIONS: None   WEIGHT BEARING RESTRICTIONS: No  FALLS:  Has patient fallen in last 6 months? No  LIVING ENVIRONMENT: Lives with: lives with their spouse Lives in: House/apartment Has following equipment at home: None  OCCUPATION: Retired.    PLOF: Independent  PATIENT GOALS: Not have pain.     OBJECTIVE:  Note: Objective measures were completed at Evaluation unless otherwise noted.  PATIENT SURVEYS:  LEFS 17/80.    PALPATION: Patient tender to palpation over left medial arch and heel.  She also c/o pain over her Achilles attachment to the calcaneus and Soleus.    LOWER EXTREMITY ROM:  Active left ankle dorsiflexion is 2 degrees with left knee in full extension and 8 degrees with knee flexed.    LOWER EXTREMITY MMT:  Normal left ankle strength.  GAIT: Slow and purposeful gait pattern.  TREATMENT DATE: 11/17/23:    Rockerboard  x 5 mins seated, and  PF and DF x 20 with Knee extended  after IASTM [place] combo x 8 mins  at 1.5 w/cm2 to LT heel/ PF Manual STW to plantar fascia and achilles and calf HMP and Pre-mod e'stim at 80-150 Hz x 20 minutes to patient's left foot (plantar surface).  Normal modality response following removal of modality.   PATIENT EDUCATION:  Education details:  Person educated:  International aid/development worker:  Education comprehension:   HOME EXERCISE PROGRAM:   ASSESSMENT:  CLINICAL  IMPRESSION: The patient presents to OPPT with c/o left heel pain since October of 2024.Rx focused on calf stretching as well as [place] and STW to plantar fascia and calf. Pt did well with Rx and reports decreased pain end of session.  OBJECTIVE IMPAIRMENTS: Abnormal gait, decreased activity tolerance, decreased ROM, increased muscle spasms, and pain.   ACTIVITY LIMITATIONS: standing and locomotion level  PARTICIPATION LIMITATIONS: meal prep, cleaning, laundry, shopping, community activity, and yard work  PERSONAL FACTORS: Time since onset of injury/illness/exacerbation are also affecting patient's functional outcome.   REHAB POTENTIAL: Good  CLINICAL DECISION MAKING: Stable/uncomplicated  EVALUATION COMPLEXITY: Low   GOALS: LONG TERM GOALS: Target date: 12/29/23.  Ind with a HEP.  Goal status: INITIAL  2.  Increase left ankle dorsiflexion to 8-10 degrees to normalize the patient's gait pattern.  Goal status: INITIAL  3.  Perform ADL's with pain not > 3/10.  Goal status: INITIAL  4.  Out of bed left heel pain-level not > 3/10.  Goal status: INITIAL  5.  Walk a community distance with pain not > 3/10.  Goal status: INITIAL  PLAN:  PT FREQUENCY: 2x/week  PT DURATION: 4 weeks  PLANNED INTERVENTIONS: 97110-Therapeutic exercises, 97530- Therapeutic activity, [id number]- Neuromuscular re-education, 97535- Self Care, [id number]- Manual therapy, G0283- Electrical stimulation (unattended), 97016- Vasopneumatic device, 97035- Ultrasound, Patient/Family education, Cryotherapy, and Moist heat  PLAN FOR NEXT SESSION: Combo e'stim/US at 1.50 W/CM2, STW/M (include left calf and Achilles), rockerboard.     Zendaya Groseclose,CHRIS, PTA 11/19/2023, 6:02 PM

## 2023-11-24 ENCOUNTER — Ambulatory Visit: Attending: Podiatry | Admitting: *Deleted

## 2023-11-24 ENCOUNTER — Encounter: Payer: Self-pay | Admitting: *Deleted

## 2023-11-24 DIAGNOSIS — M79672 Pain in left foot: Secondary | ICD-10-CM | POA: Diagnosis not present

## 2023-11-24 DIAGNOSIS — M62831 Muscle spasm of calf: Secondary | ICD-10-CM | POA: Diagnosis not present

## 2023-11-24 DIAGNOSIS — M25672 Stiffness of left ankle, not elsewhere classified: Secondary | ICD-10-CM | POA: Insufficient documentation

## 2023-11-24 NOTE — Therapy (Signed)
 OUTPATIENT PHYSICAL THERAPY LOWER EXTREMITY TREATMENT   Patient Name: Susan Contreras MRN: [id number] DOB:10-02-1940, 83 y.o., female Today's Date: 11/24/2023  END OF SESSION:  PT End of Session - 11/24/23 1102     Visit Number 3    Number of Visits 12    Date for PT Re-Evaluation 12/29/23    PT Start Time 1102    PT Stop Time 1150    PT Time Calculation (min) 48 min              Past Medical History:  Diagnosis Date   Anxiety    Depression    Diverticulosis    Esophageal stricture    Family hx of colorectal cancer    GERD (gastroesophageal reflux disease)    Helicobacter pylori (H. pylori) 1997   EGD   Hemorrhoids    Hiatal hernia    Hx of adenomatous colonic polyps    Hyperlipemia    Hypertension    PUD (peptic ulcer disease) 1997   EGD   Thyroid disease    Vertigo    Past Surgical History:  Procedure Laterality Date   ABDOMINAL HYSTERECTOMY     BILATERAL OOPHORECTOMY     CHOLECYSTECTOMY     Patient Active Problem List   Diagnosis Date Noted   Osteopenia 11/04/2021   Chronic kidney disease, stage 3 (HCC) 08/30/2021   Insomnia 05/07/2021   History of colonic polyps 04/26/2020   Benzodiazepine dependence (HCC) 05/19/2018   Controlled substance agreement signed 05/19/2018   Morbid obesity (HCC) 11/26/2017   Metabolic syndrome 09/21/2015   GAD (generalized anxiety disorder) 08/14/2015   Allergic rhinitis 08/14/2015   GERD (gastroesophageal reflux disease) 08/14/2015   Hyperlipidemia 08/14/2015   Vitamin D deficiency 08/14/2015   Hypothyroidism 05/29/2014   Cholelithiasis 01/15/2011   Family history of colon cancer 01/15/2011   Hypertension 11/11/2007   ESOPHAGEAL STRICTURE 11/11/2007   Diaphragmatic hernia 11/11/2007   DIVERTICULOSIS OF COLON 11/11/2007    REFERRING PROVIDER: Adam Phenix DPM  REFERRING DIAG: Left heel pain.  Plantar fasciitis.    THERAPY DIAG:  Pain in left foot  Stiffness of left ankle, not elsewhere classified  Muscle  spasm of calf  Rationale for Evaluation and Treatment: Rehabilitation  ONSET DATE: October, 2024.    SUBJECTIVE:   SUBJECTIVE STATEMENT: The patient  arrives today with LT foot pain 5/10. Need some new shoes   PERTINENT HISTORY: See above.   PAIN:  Are you having pain? Yes: NPRS scale: 5/10. Pain location: Left heel. Pain description: Ache. Aggravating factors: Getting up after sleeping and sitting.   Relieving factors: Nothing really.    PRECAUTIONS: None   WEIGHT BEARING RESTRICTIONS: No  FALLS:  Has patient fallen in last 6 months? No  LIVING ENVIRONMENT: Lives with: lives with their spouse Lives in: House/apartment Has following equipment at home: None  OCCUPATION: Retired.    PLOF: Independent  PATIENT GOALS: Not have pain.     OBJECTIVE:  Note: Objective measures were completed at Evaluation unless otherwise noted.  PATIENT SURVEYS:  LEFS 17/80.    PALPATION: Patient tender to palpation over left medial arch and heel.  She also c/o pain over her Achilles attachment to the calcaneus and Soleus.    LOWER EXTREMITY ROM:  Active left ankle dorsiflexion is 2 degrees with left knee in full extension and 8 degrees with knee flexed.    LOWER EXTREMITY MMT:  Normal left ankle strength.  GAIT: Slow and purposeful gait pattern.  TREATMENT DATE: 11/24/23:    Discussed foot wear with some arch support at this time. Rockerboard  x 6 mins seated,  PF and DF x 20 with Knee extended  after IASTM Reviewed plantar fascia ball mass  Short foot x 5 hold 5 secs    Handout given HEP Manual STW to plantar fascia and achilles and calf  IASTM to calf HMP and Pre-mod e'stim at 80-150 Hz x 20 minutes to patient's left foot (plantar surface).  Normal modality response following removal of modality.   PATIENT EDUCATION:  Education details:   Person educated:  International aid/development worker:  Education comprehension:   HOME EXERCISE PROGRAM:   ASSESSMENT:  CLINICAL IMPRESSION: The patient  reports doing good after last Rx with some pain relief. Rx focused on therex as well as manual STW and IASTM to LT calf and plantar fascia. Handout given for HEP. Pt did well with Rx and reports decreased pain end of session.  OBJECTIVE IMPAIRMENTS: Abnormal gait, decreased activity tolerance, decreased ROM, increased muscle spasms, and pain.   ACTIVITY LIMITATIONS: standing and locomotion level  PARTICIPATION LIMITATIONS: meal prep, cleaning, laundry, shopping, community activity, and yard work  PERSONAL FACTORS: Time since onset of injury/illness/exacerbation are also affecting patient's functional outcome.   REHAB POTENTIAL: Good  CLINICAL DECISION MAKING: Stable/uncomplicated  EVALUATION COMPLEXITY: Low   GOALS: LONG TERM GOALS: Target date: 12/29/23.  Ind with a HEP.  Goal status: INITIAL  2.  Increase left ankle dorsiflexion to 8-10 degrees to normalize the patient's gait pattern.  Goal status: INITIAL  3.  Perform ADL's with pain not > 3/10.  Goal status: INITIAL  4.  Out of bed left heel pain-level not > 3/10.  Goal status: INITIAL  5.  Walk a community distance with pain not > 3/10.  Goal status: INITIAL  PLAN:  PT FREQUENCY: 2x/week  PT DURATION: 4 weeks  PLANNED INTERVENTIONS: 97110-Therapeutic exercises, 97530- Therapeutic activity, [id number]- Neuromuscular re-education, 97535- Self Care, 16109- Manual therapy, G0283- Electrical stimulation (unattended), 97016- Vasopneumatic device, 97035- Ultrasound, Patient/Family education, Cryotherapy, and Moist heat  PLAN FOR NEXT SESSION: Combo e'stim/US at 1.50 W/CM2, STW/M (include left calf and Achilles), rockerboard.     Willo Yoon,CHRIS, PTA 11/24/2023, 2:18 PM

## 2023-11-26 ENCOUNTER — Ambulatory Visit: Admitting: Physical Therapy

## 2023-11-26 ENCOUNTER — Encounter: Payer: Self-pay | Admitting: Physical Therapy

## 2023-11-26 DIAGNOSIS — M79672 Pain in left foot: Secondary | ICD-10-CM

## 2023-11-26 DIAGNOSIS — H04123 Dry eye syndrome of bilateral lacrimal glands: Secondary | ICD-10-CM | POA: Diagnosis not present

## 2023-11-26 DIAGNOSIS — M62831 Muscle spasm of calf: Secondary | ICD-10-CM

## 2023-11-26 DIAGNOSIS — M25672 Stiffness of left ankle, not elsewhere classified: Secondary | ICD-10-CM | POA: Diagnosis not present

## 2023-11-26 DIAGNOSIS — H2512 Age-related nuclear cataract, left eye: Secondary | ICD-10-CM | POA: Diagnosis not present

## 2023-11-26 DIAGNOSIS — H40013 Open angle with borderline findings, low risk, bilateral: Secondary | ICD-10-CM | POA: Diagnosis not present

## 2023-11-26 DIAGNOSIS — H43813 Vitreous degeneration, bilateral: Secondary | ICD-10-CM | POA: Diagnosis not present

## 2023-11-26 DIAGNOSIS — H25811 Combined forms of age-related cataract, right eye: Secondary | ICD-10-CM | POA: Diagnosis not present

## 2023-11-26 NOTE — Therapy (Signed)
 OUTPATIENT PHYSICAL THERAPY LOWER EXTREMITY TREATMENT   Patient Name: Susan Contreras MRN: [id number] DOB:Nov 28, 1940, 83 y.o., female Today's Date: 11/26/2023  END OF SESSION:  PT End of Session - 11/26/23 1151     Visit Number 4    Number of Visits 12    Date for PT Re-Evaluation 12/29/23    PT Start Time 1100    PT Stop Time 1153    PT Time Calculation (min) 53 min    Activity Tolerance Patient tolerated treatment well    Behavior During Therapy WFL for tasks assessed/performed               Past Medical History:  Diagnosis Date   Anxiety    Depression    Diverticulosis    Esophageal stricture    Family hx of colorectal cancer    GERD (gastroesophageal reflux disease)    Helicobacter pylori (H. pylori) 1997   EGD   Hemorrhoids    Hiatal hernia    Hx of adenomatous colonic polyps    Hyperlipemia    Hypertension    PUD (peptic ulcer disease) 1997   EGD   Thyroid disease    Vertigo    Past Surgical History:  Procedure Laterality Date   ABDOMINAL HYSTERECTOMY     BILATERAL OOPHORECTOMY     CHOLECYSTECTOMY     Patient Active Problem List   Diagnosis Date Noted   Osteopenia 11/04/2021   Chronic kidney disease, stage 3 (HCC) 08/30/2021   Insomnia 05/07/2021   History of colonic polyps 04/26/2020   Benzodiazepine dependence (HCC) 05/19/2018   Controlled substance agreement signed 05/19/2018   Morbid obesity (HCC) 11/26/2017   Metabolic syndrome 09/21/2015   GAD (generalized anxiety disorder) 08/14/2015   Allergic rhinitis 08/14/2015   GERD (gastroesophageal reflux disease) 08/14/2015   Hyperlipidemia 08/14/2015   Vitamin D deficiency 08/14/2015   Hypothyroidism 05/29/2014   Cholelithiasis 01/15/2011   Family history of colon cancer 01/15/2011   Hypertension 11/11/2007   ESOPHAGEAL STRICTURE 11/11/2007   Diaphragmatic hernia 11/11/2007   DIVERTICULOSIS OF COLON 11/11/2007    REFERRING PROVIDER: Adam Phenix DPM  REFERRING DIAG: Left heel pain.   Plantar fasciitis.    THERAPY DIAG:  Pain in left foot  Stiffness of left ankle, not elsewhere classified  Muscle spasm of calf  Rationale for Evaluation and Treatment: Rehabilitation  ONSET DATE: October, 2024.    SUBJECTIVE:   SUBJECTIVE STATEMENT: Less pain in the morning.  PERTINENT HISTORY: See above.   PAIN:  Are you having pain? Yes: NPRS scale: 5/10. Pain location: Left heel. Pain description: Ache. Aggravating factors: Getting up after sleeping and sitting.   Relieving factors: Nothing really.    PRECAUTIONS: None   WEIGHT BEARING RESTRICTIONS: No  FALLS:  Has patient fallen in last 6 months? No  LIVING ENVIRONMENT: Lives with: lives with their spouse Lives in: House/apartment Has following equipment at home: None  OCCUPATION: Retired.    PLOF: Independent  PATIENT GOALS: Not have pain.     OBJECTIVE:  Note: Objective measures were completed at Evaluation unless otherwise noted.  PATIENT SURVEYS:  LEFS 17/80.    PALPATION: Patient tender to palpation over left medial arch and heel.  She also c/o pain over her Achilles attachment to the calcaneus and Soleus.    LOWER EXTREMITY ROM:  Active left ankle dorsiflexion is 2 degrees with left knee in full extension and 8 degrees with knee flexed.    LOWER EXTREMITY MMT:  Normal left ankle strength.  GAIT:  Slow and purposeful gait pattern.                                                                                                                                  TREATMENT DATE:   11/26/23:  Nustep level 2 x 10 minutes f/b Rockerboard x 3 minutes f/b STW/M x 10 minutes to patient's left foot plantar surface and calf f/b HMP and pre-mod e'stim at 80-150 Hz x 20 minutes to patient's left medial arch region.    11/24/23:    Discussed foot wear with some arch support at this time. Rockerboard  x 6 mins seated,  PF and DF x 20 with Knee extended  after IASTM Reviewed plantar fascia ball mass   Short foot x 5 hold 5 secs    Handout given HEP Manual STW to plantar fascia and achilles and calf  IASTM to calf HMP and Pre-mod e'stim at 80-150 Hz x 20 minutes to patient's left foot (plantar surface).  Normal modality response following removal of modality.   PATIENT EDUCATION:  Education details: See below. Person educated: Patient Education method: Demo Education comprehension: Handout  HOME EXERCISE PROGRAM: HOME EXERCISE PROGRAM [[id number]]  Towel crunch -  Repeat 10 Repetitions, Hold 5 Seconds, Complete 2 Sets, Perform 2 Times a Day  ASSESSMENT:  CLINICAL IMPRESSION: Patient reporting less morning pain.  Added towel crunches to HEP.  She did well with treatment today and felt better following.   OBJECTIVE IMPAIRMENTS: Abnormal gait, decreased activity tolerance, decreased ROM, increased muscle spasms, and pain.   ACTIVITY LIMITATIONS: standing and locomotion level  PARTICIPATION LIMITATIONS: meal prep, cleaning, laundry, shopping, community activity, and yard work  PERSONAL FACTORS: Time since onset of injury/illness/exacerbation are also affecting patient's functional outcome.   REHAB POTENTIAL: Good  CLINICAL DECISION MAKING: Stable/uncomplicated  EVALUATION COMPLEXITY: Low   GOALS: LONG TERM GOALS: Target date: 12/29/23.  Ind with a HEP.  Goal status: INITIAL  2.  Increase left ankle dorsiflexion to 8-10 degrees to normalize the patient's gait pattern.  Goal status: INITIAL  3.  Perform ADL's with pain not > 3/10.  Goal status: INITIAL  4.  Out of bed left heel pain-level not > 3/10.  Goal status: INITIAL  5.  Walk a community distance with pain not > 3/10.  Goal status: INITIAL  PLAN:  PT FREQUENCY: 2x/week  PT DURATION: 4 weeks  PLANNED INTERVENTIONS: 97110-Therapeutic exercises, 97530- Therapeutic activity, [id number]- Neuromuscular re-education, 97535- Self Care, 16109- Manual therapy, G0283- Electrical stimulation (unattended), 97016-  Vasopneumatic device, 97035- Ultrasound, Patient/Family education, Cryotherapy, and Moist heat  PLAN FOR NEXT SESSION: Combo e'stim/US at 1.50 W/CM2, STW/M (include left calf and Achilles), rockerboard.     Murle Hellstrom, [place], PT 11/26/2023, 12:02 PM

## 2023-12-01 ENCOUNTER — Encounter: Payer: Self-pay | Admitting: Family

## 2023-12-01 ENCOUNTER — Ambulatory Visit (INDEPENDENT_AMBULATORY_CARE_PROVIDER_SITE_OTHER): Admitting: Family

## 2023-12-01 ENCOUNTER — Other Ambulatory Visit: Payer: Self-pay | Admitting: Family

## 2023-12-01 ENCOUNTER — Telehealth: Payer: Self-pay

## 2023-12-01 ENCOUNTER — Encounter: Admitting: *Deleted

## 2023-12-01 ENCOUNTER — Ambulatory Visit (INDEPENDENT_AMBULATORY_CARE_PROVIDER_SITE_OTHER)

## 2023-12-01 VITALS — BP 119/76 | HR 83 | Temp 97.0°F | Ht 61.0 in | Wt 186.0 lb

## 2023-12-01 DIAGNOSIS — Z79899 Other long term (current) drug therapy: Secondary | ICD-10-CM | POA: Diagnosis not present

## 2023-12-01 DIAGNOSIS — E785 Hyperlipidemia, unspecified: Secondary | ICD-10-CM

## 2023-12-01 DIAGNOSIS — Z0001 Encounter for general adult medical examination with abnormal findings: Secondary | ICD-10-CM | POA: Diagnosis not present

## 2023-12-01 DIAGNOSIS — I159 Secondary hypertension, unspecified: Secondary | ICD-10-CM

## 2023-12-01 DIAGNOSIS — N1832 Chronic kidney disease, stage 3b: Secondary | ICD-10-CM

## 2023-12-01 DIAGNOSIS — E559 Vitamin D deficiency, unspecified: Secondary | ICD-10-CM

## 2023-12-01 DIAGNOSIS — Z0000 Encounter for general adult medical examination without abnormal findings: Secondary | ICD-10-CM

## 2023-12-01 DIAGNOSIS — F1320 Sedative, hypnotic or anxiolytic dependence, uncomplicated: Secondary | ICD-10-CM | POA: Diagnosis not present

## 2023-12-01 DIAGNOSIS — K219 Gastro-esophageal reflux disease without esophagitis: Secondary | ICD-10-CM | POA: Diagnosis not present

## 2023-12-01 DIAGNOSIS — F411 Generalized anxiety disorder: Secondary | ICD-10-CM | POA: Diagnosis not present

## 2023-12-01 DIAGNOSIS — E039 Hypothyroidism, unspecified: Secondary | ICD-10-CM

## 2023-12-01 DIAGNOSIS — Z780 Asymptomatic menopausal state: Secondary | ICD-10-CM

## 2023-12-01 DIAGNOSIS — G4700 Insomnia, unspecified: Secondary | ICD-10-CM

## 2023-12-01 MED ORDER — ATORVASTATIN CALCIUM 20 MG PO TABS: 20.0000 mg | ORAL_TABLET | Freq: Every day | ORAL | 1 refills | Status: AC

## 2023-12-01 MED ORDER — ALPRAZOLAM 0.25 MG PO TABS: ORAL_TABLET | ORAL | 2 refills | Status: DC

## 2023-12-01 NOTE — Telephone Encounter (Signed)
 Patient aware and verbalized understanding.

## 2023-12-01 NOTE — Patient Instructions (Signed)
 Health Maintenance After Age 81 After age 4, you are at a higher risk for certain long-term diseases and infections as well as injuries from falls. Falls are a major cause of broken bones and head injuries in people who are older than age 47. Getting regular preventive care can help to keep you healthy and well. Preventive care includes getting regular testing and making lifestyle changes as recommended by your health care provider. Talk with your health care provider about: Which screenings and tests you should have. A screening is a test that checks for a disease when you have no symptoms. A diet and exercise plan that is right for you. What should I know about screenings and tests to prevent falls? Screening and testing are the best ways to find a health problem early. Early diagnosis and treatment give you the best chance of managing medical conditions that are common after age 37. Certain conditions and lifestyle choices may make you more likely to have a fall. Your health care provider may recommend: Regular vision checks. Poor vision and conditions such as cataracts can make you more likely to have a fall. If you wear glasses, make sure to get your prescription updated if your vision changes. Medicine review. Work with your health care provider to regularly review all of the medicines you are taking, including over-the-counter medicines. Ask your health care provider about any side effects that may make you more likely to have a fall. Tell your health care provider if any medicines that you take make you feel dizzy or sleepy. Strength and balance checks. Your health care provider may recommend certain tests to check your strength and balance while standing, walking, or changing positions. Foot health exam. Foot pain and numbness, as well as not wearing proper footwear, can make you more likely to have a fall. Screenings, including: Osteoporosis screening. Osteoporosis is a condition that causes  the bones to get weaker and break more easily. Blood pressure screening. Blood pressure changes and medicines to control blood pressure can make you feel dizzy. Depression screening. You may be more likely to have a fall if you have a fear of falling, feel depressed, or feel unable to do activities that you used to do. Alcohol use screening. Using too much alcohol can affect your balance and may make you more likely to have a fall. Follow these instructions at home: Lifestyle Do not drink alcohol if: Your health care provider tells you not to drink. If you drink alcohol: Limit how much you have to: 0-1 drink a day for women. 0-2 drinks a day for men. Know how much alcohol is in your drink. In the [place] one drink equals one 12 oz bottle of beer (355 mL), one 5 oz glass of wine (148 mL), or one 1 oz glass of hard liquor (44 mL). Do not use any products that contain nicotine or tobacco. These products include cigarettes, chewing tobacco, and vaping devices, such as e-cigarettes. If you need help quitting, ask your health care provider. Activity  Follow a regular exercise program to stay fit. This will help you maintain your balance. Ask your health care provider what types of exercise are appropriate for you. If you need a cane or walker, use it as recommended by your health care provider. Wear supportive shoes that have nonskid soles. Safety  Remove any tripping hazards, such as rugs, cords, and clutter. Install safety equipment such as grab bars in bathrooms and safety rails on stairs. Keep rooms and walkways  well-lit. General instructions Talk with your health care provider about your risks for falling. Tell your health care provider if: You fall. Be sure to tell your health care provider about all falls, even ones that seem minor. You feel dizzy, tiredness (fatigue), or off-balance. Take over-the-counter and prescription medicines only as told by your health care provider. These include  supplements. Eat a healthy diet and maintain a healthy weight. A healthy diet includes low-fat dairy products, low-fat (lean) meats, and fiber from whole grains, beans, and lots of fruits and vegetables. Stay current with your vaccines. Schedule regular health, dental, and eye exams. Summary Having a healthy lifestyle and getting preventive care can help to protect your health and wellness after age 11. Screening and testing are the best way to find a health problem early and help you avoid having a fall. Early diagnosis and treatment give you the best chance for managing medical conditions that are more common for people who are older than age 28. Falls are a major cause of broken bones and head injuries in people who are older than age 48. Take precautions to prevent a fall at home. Work with your health care provider to learn what changes you can make to improve your health and wellness and to prevent falls. This information is not intended to replace advice given to you by your health care provider. Make sure you discuss any questions you have with your health care provider. Document Revised: 12/31/2020 Document Reviewed: 12/31/2020 Elsevier Patient Education  2024 [employer].

## 2023-12-01 NOTE — Telephone Encounter (Signed)
 Pharmacy aware

## 2023-12-01 NOTE — Progress Notes (Signed)
 Subjective:    Patient ID: Susan Contreras, female    DOB: 06-21-1941, 83 y.o.   MRN: [id number]  Chief Complaint  Patient presents with   Annual Exam    Planters foot left foot doing PT   Pt calls the office today for CPE and chronic follow up. She is followed by GI as needed. She has CKD and avoid NSAID's.    She is morbid obese with a BMI of 35 with HTN and GERD.    She has plantar fascitis and followed by Podiatry and doing PT.   Hypertension This is a chronic problem. The current episode started more than 1 year ago. The problem has been resolved since onset. The problem is controlled. Associated symptoms include malaise/fatigue. Pertinent negatives include no peripheral edema or shortness of breath. Risk factors for coronary artery disease include dyslipidemia and sedentary lifestyle. The current treatment provides moderate improvement. Identifiable causes of hypertension include a thyroid problem.  Gastroesophageal Reflux She complains of belching and heartburn. This is a chronic problem. The current episode started more than 1 year ago. The problem occurs occasionally. The symptoms are aggravated by medications. Associated symptoms include fatigue. Risk factors include obesity. She has tried a PPI for the symptoms. The treatment provided moderate relief.  Thyroid Problem Presents for follow-up visit. Symptoms include anxiety and fatigue. Patient reports no constipation, depressed mood, diarrhea or dry skin. The symptoms have been stable.  Insomnia Primary symptoms: difficulty falling asleep, malaise/fatigue.   The current episode started more than one year. The problem occurs intermittently. Past treatments include medication. The treatment provided moderate relief.  Hyperlipidemia This is a chronic problem. The current episode started more than 1 year ago. The problem is uncontrolled. Recent lipid tests were reviewed and are high. Exacerbating diseases include obesity. Pertinent  negatives include no shortness of breath. Current antihyperlipidemic treatment includes statins. The current treatment provides moderate improvement of lipids. Risk factors for coronary artery disease include dyslipidemia, hypertension, a sedentary lifestyle and post-menopausal.  Anxiety Presents for follow-up visit. Symptoms include excessive worry, nervous/anxious behavior and restlessness. Patient reports no depressed mood or shortness of breath. Symptoms occur occasionally. The severity of symptoms is moderate.        Review of Systems  Constitutional:  Positive for fatigue and malaise/fatigue.  Respiratory:  Negative for shortness of breath.   Gastrointestinal:  Positive for heartburn. Negative for constipation and diarrhea.  Psychiatric/Behavioral:  The patient is nervous/anxious.   All other systems reviewed and are negative.   Family History  Problem Relation Age of Onset   Colon cancer Sister    Stroke Mother    Heart disease Mother    Osteoporosis Mother    Parkinson's disease Father    Osteoporosis Father    Ovarian cancer Sister    Esophageal cancer Neg Hx    Stomach cancer Neg Hx    Kidney disease Neg Hx    Liver disease Neg Hx    Diabetes Neg Hx    Social History   Socioeconomic History   Marital status: Married    Spouse name: Not on file   Number of children: 2   Years of education: Not on file   Highest education level: Not on file  Occupational History   Occupation: Retired  Tobacco Use   Smoking status: Never   Smokeless tobacco: Never  Vaping Use   Vaping status: Never Used  Substance and Sexual Activity   Alcohol use: No   Drug use: No  Sexual activity: Not on file  Other Topics Concern   Not on file  Social History Narrative   2 caffeine drinks daily    Social Drivers of Health   Financial Resource Strain: Low Risk  (10/27/2022)   Overall Financial Resource Strain (CARDIA)    Difficulty of Paying Living Expenses: Not hard at all  Food  Insecurity: No Food Insecurity (10/27/2022)   Hunger Vital Sign    Worried About Running Out of Food in the Last Year: Never true    Ran Out of Food in the Last Year: Never true  Transportation Needs: No Transportation Needs (10/27/2022)   PRAPARE - Administrator, Civil Service (Medical): No    Lack of Transportation (Non-Medical): No  Physical Activity: Insufficiently Active (10/27/2022)   Exercise Vital Sign    Days of Exercise per Week: 3 days    Minutes of Exercise per Session: 30 min  Stress: No Stress Concern Present (10/27/2022)   [employer] of Occupational Health - Occupational Stress Questionnaire    Feeling of Stress : Only a little  Social Connections: Moderately Integrated (10/27/2022)   Social Connection and Isolation Panel [NHANES]    Frequency of Communication with Friends and Family: More than three times a week    Frequency of Social Gatherings with Friends and Family: More than three times a week    Attends Religious Services: More than 4 times per year    Active Member of [employer] or Organizations: No    Attends Banker Meetings: Never    Marital Status: Married       Objective:   Physical Exam Vitals reviewed.  Constitutional:      General: She is not in acute distress.    Appearance: She is well-developed. She is obese.  HENT:     Head: Normocephalic and atraumatic.     Right Ear: Tympanic membrane normal.     Left Ear: Tympanic membrane normal.  Eyes:     Pupils: Pupils are equal, round, and reactive to light.  Neck:     Thyroid: No thyromegaly.  Cardiovascular:     Rate and Rhythm: Regular rhythm.     Heart sounds: Normal heart sounds. No murmur heard. Pulmonary:     Effort: Pulmonary effort is normal. No respiratory distress.     Breath sounds: Normal breath sounds. No wheezing.  Abdominal:     General: Bowel sounds are normal. There is no distension.     Palpations: Abdomen is soft.     Tenderness: There is no abdominal  tenderness.  Musculoskeletal:        General: No tenderness. Normal range of motion.     Cervical back: Normal range of motion and neck supple.  Skin:    General: Skin is warm and dry.  Neurological:     Mental Status: She is alert and oriented to person, place, and time.     Cranial Nerves: No cranial nerve deficit.     Deep Tendon Reflexes: Reflexes are normal and symmetric.  Psychiatric:        Behavior: Behavior normal.        Thought Content: Thought content normal.        Judgment: Judgment normal.       BP 119/76   Pulse 83   Temp (!) 97 F (36.1 C) (Temporal)   Ht 5\' 1"  (1.549 m)   Wt 186 lb (84.4 kg)   SpO2 97%   BMI 35.14 kg/m  Assessment & Plan:   Susan Contreras comes in today with chief complaint of Annual Exam (Planters foot left foot doing PT)   Diagnosis and orders addressed:  1. GAD (generalized anxiety disorder) - ALPRAZolam (XANAX) 0.25 MG tablet; TAKE 1 TABLET TWICE DAILY AS NEEDED FOR ANXIETY  Dispense: 60 tablet; Refill: 2 - CMP14+EGFR - CBC with Differential/Platelet - ToxASSURE Select 13 (MW), Urine - Drug Screen 10 W/Conf, Se  2. Benzodiazepine dependence (HCC) - ALPRAZolam (XANAX) 0.25 MG tablet; TAKE 1 TABLET TWICE DAILY AS NEEDED FOR ANXIETY  Dispense: 60 tablet; Refill: 2 - CMP14+EGFR - CBC with Differential/Platelet - ToxASSURE Select 13 (MW), Urine  3. Controlled substance agreement signed - ALPRAZolam (XANAX) 0.25 MG tablet; TAKE 1 TABLET TWICE DAILY AS NEEDED FOR ANXIETY  Dispense: 60 tablet; Refill: 2 - CMP14+EGFR - CBC with Differential/Platelet - ToxASSURE Select 13 (MW), Urine  4. Hyperlipidemia, unspecified hyperlipidemia type - atorvastatin (LIPITOR) 20 MG tablet; Take 1 tablet (20 mg total) by mouth daily.  Dispense: 90 tablet; Refill: 1 - CMP14+EGFR - CBC with Differential/Platelet  5. Annual physical exam (Primary) - CMP14+EGFR - CBC with Differential/Platelet - Lipid panel - TSH - VITAMIN D 25 Hydroxy (Vit-D  Deficiency, Fractures)  6. Secondary hypertension - CMP14+EGFR - CBC with Differential/Platelet  7. Hypothyroidism, unspecified type  - CMP14+EGFR - CBC with Differential/Platelet - TSH  8. Gastroesophageal reflux disease, unspecified whether esophagitis present - CMP14+EGFR - CBC with Differential/Platelet  9. Vitamin D deficiency  - CMP14+EGFR - CBC with Differential/Platelet - VITAMIN D 25 Hydroxy (Vit-D Deficiency, Fractures)  10. Morbid obesity (HCC)  - CMP14+EGFR - CBC with Differential/Platelet  11. Insomnia, unspecified type  - CMP14+EGFR - CBC with Differential/Platelet  12. CKD stage 3b, GFR 30-44 ml/min [facility]   Labs pending  Patient reviewed in [place] controlled database, no flags noted. Contract and drug screen up dated today.  Continue current medications  Health Maintenance reviewed Diet and exercise encouraged  Follow up plan: 3 months    Jannifer Rodney, FNP

## 2023-12-01 NOTE — Telephone Encounter (Signed)
 Pt can take every other day if she is not having any symptoms. She can try to increase to daily and if she gets any muscle pain she can decrease back to every other day.

## 2023-12-01 NOTE — Telephone Encounter (Signed)
 Copied from CRM [id number]. Topic: Clinical - Prescription Issue >> Dec 01, 2023 12:15 PM Fuller Mandril wrote: Reason for CRM: Pharmacy called to confirm dose change for atorvastatin (LIPITOR) 20 MG tablet. States previous dose was every other day. Today they received every day. Needs to confirm. Thank You

## 2023-12-02 DIAGNOSIS — Z780 Asymptomatic menopausal state: Secondary | ICD-10-CM | POA: Diagnosis not present

## 2023-12-02 DIAGNOSIS — M85851 Other specified disorders of bone density and structure, right thigh: Secondary | ICD-10-CM | POA: Diagnosis not present

## 2023-12-02 DIAGNOSIS — M85852 Other specified disorders of bone density and structure, left thigh: Secondary | ICD-10-CM | POA: Diagnosis not present

## 2023-12-03 ENCOUNTER — Other Ambulatory Visit: Payer: Self-pay | Admitting: Family

## 2023-12-03 ENCOUNTER — Encounter: Admitting: *Deleted

## 2023-12-03 DIAGNOSIS — M79672 Pain in left foot: Secondary | ICD-10-CM | POA: Diagnosis not present

## 2023-12-03 DIAGNOSIS — M722 Plantar fascial fibromatosis: Secondary | ICD-10-CM | POA: Diagnosis not present

## 2023-12-03 MED ORDER — VITAMIN D (ERGOCALCIFEROL) 1.25 MG (50000 UNIT) PO CAPS: 50000.0000 [IU] | ORAL_CAPSULE | ORAL | 3 refills | Status: DC

## 2023-12-07 ENCOUNTER — Other Ambulatory Visit: Payer: Self-pay | Admitting: Family

## 2023-12-07 LAB — CMP14+EGFR
ALT: 8 IU/L (ref 0–32)
AST: 17 IU/L (ref 0–40)
Albumin: 4.4 g/dL (ref 3.8–4.8)
Alkaline Phosphatase: 91 IU/L (ref 44–121)
BUN/Creatinine Ratio: 8 — ABNORMAL LOW (ref 12–28)
BUN: 11 mg/dL (ref 8–27)
Bilirubin Total: 0.4 mg/dL (ref 0.0–1.2)
CO2: 19 mmol/L — ABNORMAL LOW (ref 20–29)
Calcium: 9.5 mg/dL (ref 8.7–10.3)
Chloride: 103 mmol/L (ref 96–106)
Creatinine, Ser: 1.37 mg/dL — ABNORMAL HIGH (ref 0.57–1.00)
Globulin, Total: 2.7 g/dL (ref 1.5–4.5)
Glucose: 91 mg/dL (ref 70–99)
Potassium: 4.2 mmol/L (ref 3.5–5.2)
Sodium: 136 mmol/L (ref 134–144)
Total Protein: 7.1 g/dL (ref 6.0–8.5)
eGFR: 39 mL/min/{1.73_m2} — ABNORMAL LOW (ref >59)

## 2023-12-07 LAB — CBC WITH DIFFERENTIAL/PLATELET
Basophils Absolute: 0.1 10*3/uL (ref 0.0–0.2)
Basos: 1 %
EOS (ABSOLUTE): 0.3 10*3/uL (ref 0.0–0.4)
Eos: 3 %
Hematocrit: 39.7 % (ref 34.0–46.6)
Hemoglobin: 13.2 g/dL (ref 11.1–15.9)
Immature Grans (Abs): 0 10*3/uL (ref 0.0–0.1)
Immature Granulocytes: 0 %
Lymphocytes Absolute: 3.2 10*3/uL — ABNORMAL HIGH (ref 0.7–3.1)
Lymphs: 30 %
MCH: 30.1 pg (ref 26.6–33.0)
MCHC: 33.2 g/dL (ref 31.5–35.7)
MCV: 91 fL (ref 79–97)
Monocytes Absolute: 0.8 10*3/uL (ref 0.1–0.9)
Monocytes: 8 %
Neutrophils Absolute: 6.3 10*3/uL (ref 1.4–7.0)
Neutrophils: 58 %
Platelets: 256 10*3/uL (ref 150–450)
RBC: 4.38 x10E6/uL (ref 3.77–5.28)
RDW: 13.2 % (ref 11.7–15.4)
WBC: 10.8 10*3/uL (ref 3.4–10.8)

## 2023-12-07 LAB — DRUG SCREEN 10 W/CONF, SERUM
Amphetamines, IA: NEGATIVE ng/mL
Barbiturates, IA: NEGATIVE ug/mL
Benzodiazepines, IA: NEGATIVE ng/mL
Cocaine & Metabolite, IA: NEGATIVE ng/mL
Methadone, IA: NEGATIVE ng/mL
Opiates, IA: NEGATIVE ng/mL
Oxycodones, IA: NEGATIVE ng/mL
Phencyclidine, IA: NEGATIVE ng/mL
Propoxyphene, IA: NEGATIVE ng/mL
THC(Marijuana) Metabolite, IA: NEGATIVE ng/mL

## 2023-12-07 LAB — LIPID PANEL
Chol/HDL Ratio: 5.4 ratio — ABNORMAL HIGH (ref 0.0–4.4)
Cholesterol, Total: 232 mg/dL — ABNORMAL HIGH (ref 100–199)
HDL: 43 mg/dL (ref >39)
LDL Chol Calc (NIH): 101 mg/dL — ABNORMAL HIGH (ref 0–99)
Triglycerides: 524 mg/dL — ABNORMAL HIGH (ref 0–149)
VLDL Cholesterol Cal: 88 mg/dL — ABNORMAL HIGH (ref 5–40)

## 2023-12-07 LAB — TSH: TSH: 2.67 u[IU]/mL (ref 0.450–4.500)

## 2023-12-07 LAB — VITAMIN D 25 HYDROXY (VIT D DEFICIENCY, FRACTURES): Vit D, 25-Hydroxy: 18.8 ng/mL — ABNORMAL LOW (ref 30.0–100.0)

## 2023-12-07 MED ORDER — VITAMIN D (ERGOCALCIFEROL) 1.25 MG (50000 UNIT) PO CAPS: 50000.0000 [IU] | ORAL_CAPSULE | ORAL | 3 refills | Status: AC

## 2023-12-09 ENCOUNTER — Ambulatory Visit: Payer: Self-pay

## 2023-12-09 NOTE — Telephone Encounter (Signed)
 Chief Complaint: Vertigo Symptoms: feeling "swimmy headed" Frequency: recurring, intermittent episodes, worsening Pertinent Negatives: Patient denies numbness/weakness of arms, legs, or face, or difficulty with speech Disposition: [] ED /[] Urgent Care (no appt availability in office) / [x] Appointment(In office/virtual)/ []  [place] [facility]/ [] Home Care/ [x] Refused Recommended Disposition /[] [place] [facility]/ []  Follow-up with PCP Additional Notes: Patient called to report a vertigo episode that occurred this morning. Patient states she has them intermittently but today felt worse. This RN assessed for s/s of stroke and patient was negative. Patient states she doesn't understand why some days she is okay and other days she has vertigo spells. Patient states she saw her PCP last week and will not come back in for another appt, but states "Kathaleen Pale knows when it gets bad that I need an antibiotic. Why can't she just call me in one?". This RN advised a message would be sent to PCP with this information.    Copied from CRM [id number]. Topic: Clinical - Red Word Triage >> Dec 09, 2023  8:55 AM Antwanette L wrote: Red Word that prompted transfer to Nurse Triage: Patient has vertigo and is experiencing dizziness Reason for Disposition  [1] MILD dizziness (e.g., vertigo; walking normally) AND [2] has been evaluated by doctor (or NP/PA) for this  Answer Assessment - Initial Assessment Questions 1. DESCRIPTION: "Describe your dizziness."     "Swimmy headed" 2. VERTIGO: "Do you feel like either you or the room is spinning or tilting?"      Unsure 3. LIGHTHEADED: "Do you feel lightheaded?" (e.g., somewhat faint, woozy, weak upon standing)     No, "feel like get swimmy headed" 4. SEVERITY: "How bad is it?"  "Can you walk?"   - MILD: Feels slightly dizzy and unsteady, but is walking normally.   - MODERATE: Feels unsteady when walking, but not falling; interferes with normal activities (e.g., school,  work).   - SEVERE: Unable to walk without falling, or requires assistance to walk without falling.     Mild - gets better as day goes on 5. ONSET:  "When did the dizziness begin?"     This morning 6. AGGRAVATING FACTORS: "Does anything make it worse?" (e.g., standing, change in head position)     Movement  7. CAUSE: "What do you think is causing the dizziness?"     Vertigo 8. RECURRENT SYMPTOM: "Have you had dizziness before?" If Yes, ask: "When was the last time?" "What happened that time?"     Yes - has had a few episodes recently  9. OTHER SYMPTOMS: "Do you have any other symptoms?" (e.g., headache, weakness, numbness, vomiting, earache)     Mild headache  Protocols used: Dizziness - Vertigo-A-AH

## 2023-12-09 NOTE — Telephone Encounter (Signed)
 Patient called back and spoke with RN Ramiro Burly. Closing encounter, please refer to nurse triage note.

## 2023-12-09 NOTE — Telephone Encounter (Signed)
 Patient called,unable to leave a  VM to return the call to the office to speak to NT due to phone line being busy.    Summary: Vertigo, possible sinus infection   Copied From CRM [id number]. Reason for Triage: Pt called reporting possible sinus infection symptoms, says she has vertigo and feels swimmy headed. Seeking Antibiotic  Best contact: [phone]

## 2023-12-09 NOTE — Telephone Encounter (Signed)
 Please review and advise.

## 2023-12-10 ENCOUNTER — Other Ambulatory Visit: Payer: Self-pay | Admitting: Family

## 2023-12-10 MED ORDER — AMOXICILLIN 500 MG PO CAPS
500.0000 mg | ORAL_CAPSULE | Freq: Two times a day (BID) | ORAL | 0 refills | Status: AC
Start: 2023-12-10 — End: 2023-12-20

## 2023-12-10 NOTE — Telephone Encounter (Signed)
 Amoxicillin 500 mg BID Prescription sent to pharmacy

## 2023-12-10 NOTE — Telephone Encounter (Signed)
Attempted to contact patient with no answer.

## 2024-01-25 ENCOUNTER — Telehealth: Payer: Self-pay

## 2024-01-25 NOTE — Telephone Encounter (Signed)
 Copied from CRM [id number]. Topic: Clinical - Medical Advice >> Jan 25, 2024 10:49 AM Susan Contreras T wrote: Reason for CRM: patient said she thinks something is going on with her thyroid . Says her finger nails are not growing when she use to clip them every week she has not had to clip them in over 3 weeks, she stay tired and her hair is falling out. Patient refused to speak with a nurse she just wanted Susan Contreras to call her back. There were no appts with Susan Contreras before 6/27 and she did not want to see another provider

## 2024-01-25 NOTE — Telephone Encounter (Signed)
 Lmtcb

## 2024-01-28 ENCOUNTER — Telehealth: Payer: Self-pay | Admitting: Family

## 2024-01-28 NOTE — Telephone Encounter (Signed)
 error

## 2024-02-01 ENCOUNTER — Ambulatory Visit: Payer: Self-pay

## 2024-02-01 NOTE — Telephone Encounter (Signed)
 Patient not confident in doing a video visit. Patient wants to wait to be seen this coming Thursday. Says if her symptoms get worse, she will call and let us  know.

## 2024-02-01 NOTE — Telephone Encounter (Signed)
 FYI Only or Action Required?: FYI only for provider  Patient was last seen in primary care on 12/01/2023 by Yevette Hem, FNP. Called Nurse Triage reporting Dizziness. Symptoms began 2 days ago. Interventions attempted: Nothing. Symptoms are: unchanged.  Triage Disposition: See Physician Within 24 Hours  Patient/caregiver understands and will follow disposition?: No, wishes to speak with PCP  Copied from CRM [id number]. Topic: Clinical - Red Word Triage >> Feb 01, 2024 10:01 AM Elle L wrote: Red Word that prompted transfer to Nurse Triage: The patient is having sinus issues. She is swimmy headed, has vertigo, and a headache. Reason for Disposition  Earache  Answer Assessment - Initial Assessment Questions 1. DESCRIPTION: "Describe your dizziness."     Swimmy headed and I cannot get out of the bed 2. VERTIGO: "Do you feel like either you or the room is spinning or tilting?"      denies 3. LIGHTHEADED: "Do you feel lightheaded?" (e.g., somewhat faint, woozy, weak upon standing)     denies 4. SEVERITY: "How bad is it?"  "Can you walk?"   - MILD: Feels slightly dizzy and unsteady, but is walking normally.   - MODERATE: Feels unsteady when walking, but not falling; interferes with normal activities (e.g., school, work).   - SEVERE: Unable to walk without falling, or requires assistance to walk without falling.     moderate 5. ONSET:  "When did the dizziness begin?"     2 days ago 6. AGGRAVATING FACTORS: "Does anything make it worse?" (e.g., standing, change in head position)     Change in head position makes it worse, not all the time, 7. CAUSE: "What do you think is causing the dizziness?"     Vertigo, have had it before 8. RECURRENT SYMPTOM: "Have you had dizziness before?" If Yes, ask: "When was the last time?" "What happened that time?"     Takes meclizine , can't remember the last episode, many years ago 9. OTHER SYMPTOMS: "Do you have any other symptoms?" (e.g., headache, weakness,  numbness, vomiting, earache)     Inner ear pain, turn head get motion sickness Multiple appts were offered within 24 hours, pt refused. Pt scheduled with PCP. Denies vision, speech changes. Denies CMS changes.  Protocols used: Dizziness - Vertigo-A-AH

## 2024-02-04 ENCOUNTER — Ambulatory Visit (INDEPENDENT_AMBULATORY_CARE_PROVIDER_SITE_OTHER): Admitting: Family

## 2024-02-04 ENCOUNTER — Encounter: Payer: Self-pay | Admitting: Family

## 2024-02-04 VITALS — BP 129/78 | HR 55 | Temp 97.8°F | Ht 61.0 in | Wt 186.8 lb

## 2024-02-04 DIAGNOSIS — J0100 Acute maxillary sinusitis, unspecified: Secondary | ICD-10-CM

## 2024-02-04 MED ORDER — AMOXICILLIN 500 MG PO CAPS: 500.0000 mg | ORAL_CAPSULE | Freq: Two times a day (BID) | ORAL | 0 refills | Status: AC

## 2024-02-04 NOTE — Progress Notes (Signed)
 Subjective:    Patient ID: Susan Contreras, female    DOB: 07/08/1941, 83 y.o.   MRN: [id number]  Chief Complaint  Patient presents with   Dizziness   Fatigue   Sinusitis    PATIENT STATES SHE HAS INFECTION  AMOXICILLIN . pATIENT STATES SHE GOT SOMETHING GOING ON WITH TEETH    HAIR FALLING OUT     Dizziness Associated symptoms include congestion, coughing and a sore throat (some times). Pertinent negatives include no headaches.  Sinusitis This is a new problem. The current episode started 1 to 4 weeks ago. The problem has been gradually worsening since onset. There has been no fever. Her pain is at a severity of 4/10 (left side). The pain is mild. Associated symptoms include congestion, coughing, ear pain, shortness of breath, sinus pressure, sneezing and a sore throat (some times). Pertinent negatives include no headaches. Past treatments include acetaminophen (norel AD, antivert ,). The treatment provided mild relief.      Review of Systems  HENT:  Positive for congestion, ear pain, sinus pressure, sneezing and sore throat (some times).   Respiratory:  Positive for cough and shortness of breath.   Neurological:  Positive for dizziness. Negative for headaches.  All other systems reviewed and are negative.   Social History   Socioeconomic History   Marital status: Married    Spouse name: Not on file   Number of children: 2   Years of education: Not on file   Highest education level: Not on file  Occupational History   Occupation: Retired  Tobacco Use   Smoking status: Never   Smokeless tobacco: Never  Vaping Use   Vaping status: Never Used  Substance and Sexual Activity   Alcohol use: No   Drug use: No   Sexual activity: Not on file  Other Topics Concern   Not on file  Social History Narrative   2 caffeine drinks daily    Social Drivers of Health   Financial Resource Strain: Low Risk  (10/27/2022)   Overall Financial Resource Strain (CARDIA)    Difficulty of Paying  Living Expenses: Not hard at all  Food Insecurity: No Food Insecurity (10/27/2022)   Hunger Vital Sign    Worried About Running Out of Food in the Last Year: Never true    Ran Out of Food in the Last Year: Never true  Transportation Needs: No Transportation Needs (10/27/2022)   PRAPARE - Administrator, Civil Service (Medical): No    Lack of Transportation (Non-Medical): No  Physical Activity: Insufficiently Active (10/27/2022)   Exercise Vital Sign    Days of Exercise per Week: 3 days    Minutes of Exercise per Session: 30 min  Stress: No Stress Concern Present (10/27/2022)   [employer] of Occupational Health - Occupational Stress Questionnaire    Feeling of Stress : Only a little  Social Connections: Moderately Integrated (10/27/2022)   Social Connection and Isolation Panel    Frequency of Communication with Friends and Family: More than three times a week    Frequency of Social Gatherings with Friends and Family: More than three times a week    Attends Religious Services: More than 4 times per year    Active Member of [employer] or Organizations: No    Attends Banker Meetings: Never    Marital Status: Married   Family History  Problem Relation Age of Onset   Colon cancer Sister    Stroke Mother    Heart disease Mother  Osteoporosis Mother    Parkinson's disease Father    Osteoporosis Father    Ovarian cancer Sister    Esophageal cancer Neg Hx    Stomach cancer Neg Hx    Kidney disease Neg Hx    Liver disease Neg Hx    Diabetes Neg Hx         Objective:   Physical Exam Vitals reviewed.  Constitutional:      General: She is not in acute distress.    Appearance: She is well-developed.  HENT:     Head: Normocephalic and atraumatic.     Right Ear: Tympanic membrane normal.     Left Ear: Tympanic membrane normal.     Nose:     Left Sinus: Maxillary sinus tenderness and frontal sinus tenderness present.   Eyes:     Pupils: Pupils are equal,  round, and reactive to light.   Neck:     Thyroid : No thyromegaly.   Cardiovascular:     Rate and Rhythm: Normal rate and regular rhythm.     Heart sounds: Normal heart sounds. No murmur heard. Pulmonary:     Effort: Pulmonary effort is normal. No respiratory distress.     Breath sounds: Normal breath sounds. No wheezing.  Abdominal:     General: Bowel sounds are normal. There is no distension.     Palpations: Abdomen is soft.     Tenderness: There is no abdominal tenderness.   Musculoskeletal:        General: No tenderness. Normal range of motion.     Cervical back: Normal range of motion and neck supple.   Skin:    General: Skin is warm and dry.   Neurological:     Mental Status: She is alert and oriented to person, place, and time.     Cranial Nerves: No cranial nerve deficit.     Deep Tendon Reflexes: Reflexes are normal and symmetric.   Psychiatric:        Behavior: Behavior normal.        Thought Content: Thought content normal.        Judgment: Judgment normal.       BP 129/78   Pulse (!) 55   Temp 97.8 F (36.6 C)   Ht 5' 1 (1.549 m)   Wt 186 lb 12.8 oz (84.7 kg)   SpO2 96%   BMI 35.30 kg/m      Assessment & Plan:  Susan Contreras comes in today with chief complaint of Dizziness, Fatigue, Sinusitis (PATIENT STATES SHE HAS INFECTION  AMOXICILLIN . pATIENT STATES SHE GOT SOMETHING GOING ON WITH TEETH ), and HAIR FALLING OUT    Diagnosis and orders addressed:  1. Acute non-recurrent maxillary sinusitis (Primary) - Take meds as prescribed - Use a cool mist humidifier  -Use saline nose sprays frequently -Force fluids -For any cough or congestion  Use plain Mucinex- regular strength or max strength is fine -For fever or aces or pains- take tylenol or ibuprofen. -Throat lozenges if help -Follow up if symptoms worsen or do not improve  - amoxicillin  (AMOXIL ) 500 MG capsule; Take 1 capsule (500 mg total) by mouth 2 (two) times daily for 10 days.  Dispense:  20 capsule; Refill: 0      Tommas Fragmin, FNP

## 2024-02-04 NOTE — Patient Instructions (Signed)

## 2024-02-11 DIAGNOSIS — D485 Neoplasm of uncertain behavior of skin: Secondary | ICD-10-CM | POA: Diagnosis not present

## 2024-02-11 DIAGNOSIS — L821 Other seborrheic keratosis: Secondary | ICD-10-CM | POA: Diagnosis not present

## 2024-02-11 DIAGNOSIS — L820 Inflamed seborrheic keratosis: Secondary | ICD-10-CM | POA: Diagnosis not present

## 2024-02-11 DIAGNOSIS — L089 Local infection of the skin and subcutaneous tissue, unspecified: Secondary | ICD-10-CM | POA: Diagnosis not present

## 2024-02-11 DIAGNOSIS — L814 Other melanin hyperpigmentation: Secondary | ICD-10-CM | POA: Diagnosis not present

## 2024-02-11 DIAGNOSIS — L570 Actinic keratosis: Secondary | ICD-10-CM | POA: Diagnosis not present

## 2024-02-11 DIAGNOSIS — D1801 Hemangioma of skin and subcutaneous tissue: Secondary | ICD-10-CM | POA: Diagnosis not present

## 2024-02-25 ENCOUNTER — Ambulatory Visit: Admitting: Family

## 2024-03-03 DIAGNOSIS — R69 Illness, unspecified: Secondary | ICD-10-CM | POA: Diagnosis not present

## 2024-03-07 ENCOUNTER — Other Ambulatory Visit: Payer: Self-pay | Admitting: Family

## 2024-03-07 DIAGNOSIS — I10 Essential (primary) hypertension: Secondary | ICD-10-CM

## 2024-03-09 NOTE — Telephone Encounter (Signed)
 Can Bari work patient in to be seen for med refill appt in August?   Copied from CRM [id number]. Topic: Appointments - Scheduling Inquiry for Clinic >> Mar 09, 2024  9:06 AM Avram MATSU wrote: Reason for CRM: patient got a message from the pharmacy stated the pt needed to be seen before get her BP medication can get refilled irbesartan  (AVAPRO ) 75 MG tablet [[id number]]. I tried to make an appt and it was booked out until September. Please give pt a callback if theres an appt to come in sooner. [phone]    ----------------------------------------------------------------------- From previous Reason for Contact - Scheduling: Patient/patient representative is calling to schedule an appointment. Refer to attachments for appointment information.

## 2024-03-21 ENCOUNTER — Encounter: Payer: Self-pay | Admitting: Cardiology

## 2024-03-21 ENCOUNTER — Ambulatory Visit: Attending: Cardiology | Admitting: Cardiology

## 2024-03-21 VITALS — BP 128/84 | HR 77 | Ht 62.0 in | Wt 184.4 lb

## 2024-03-21 DIAGNOSIS — E782 Mixed hyperlipidemia: Secondary | ICD-10-CM

## 2024-03-21 DIAGNOSIS — I10 Essential (primary) hypertension: Secondary | ICD-10-CM

## 2024-03-21 NOTE — Progress Notes (Signed)
 Clinical Summary Susan Contreras is a 83 y.o.female seen today for follow up of the following medical problems.    1. Chest pain - ER visit 06/2019 with chest pain - trop neg. CXR no acute process. EKG SR, LAFB. Possible anterolateral Qwaves in ER EKG but not seen in 06/17/19 EKG, I think related to lead placement - from ER notes symptoms improved with mylanta and viscous lidocaine .  CAD risk factors: HTN, HL   - history of GERD and PUD - remote cath 20 years she reports was ok. - has not been a recent issue   2. HTN -compliant with meds     3. Hyperlipidemia - 05/2019 TC 265 TG 280 LDL 164  - started on lipitor at that time by pcp   11/2021 TC 210 TG 390 HDL 46 LDL 99 -11/2023 TC 232 TG 524 HDL 43 LDL 101 - on her own only taking atorvastatin  10mg  as opposed to 20mg . Caused bone pains, fatigue. - reports high sweet intake. Not intersted in additional medications.         Past Medical History:  Diagnosis Date   Anxiety    Depression    Diverticulosis    Esophageal stricture    Family hx of colorectal cancer    GERD (gastroesophageal reflux disease)    Helicobacter pylori (H. pylori) 1997   EGD   Hemorrhoids    Hiatal hernia    Hx of adenomatous colonic polyps    Hyperlipemia    Hypertension    PUD (peptic ulcer disease) 1997   EGD   Thyroid  disease    Vertigo      Allergies  Allergen Reactions   Biaxin [Clarithromycin]    Cephalexin    Ciprofloxacin    Doxycycline     Headaches   Flagyl [Metronidazole Hcl]    Ketek [Telithromycin]    Sulfa Antibiotics      Current Outpatient Medications  Medication Sig Dispense Refill   ALPRAZolam  (XANAX ) 0.25 MG tablet TAKE 1 TABLET TWICE DAILY AS NEEDED FOR ANXIETY 60 tablet 2   atorvastatin  (LIPITOR) 20 MG tablet Take 1 tablet (20 mg total) by mouth daily. 90 tablet 1   Chlorphen-PE-Acetaminophen (NOREL AD PO) Take 1 tablet by mouth as needed.      Cholecalciferol (VITAMIN D3) 125 MCG (5000 UT) CAPS Take 1  capsule (5,000 Units total) by mouth daily. 90 capsule 0   famotidine  (PEPCID ) 40 MG tablet Take 1 tablet (40 mg total) by mouth daily. 90 tablet 1   fluticasone  (FLONASE ) 50 MCG/ACT nasal spray PLACE 1 SPRAY INTO BOTH NOSTRILS DAILY 16 g 5   irbesartan  (AVAPRO ) 75 MG tablet Take 1 tablet (75 mg total) by mouth daily. **NEEDS TO BE SEEN BEFORE NEXT REFILL** 30 tablet 0   levothyroxine  (SYNTHROID ) 50 MCG tablet TAKE ONE TABLET BY MOUTH ONCE DAILY 90 tablet 2   meclizine  (ANTIVERT ) 25 MG tablet TAKE ONE TABLET THREE TIMES DAILY AS NEEDED FOR DIZZINESS 90 tablet 1   pantoprazole  (PROTONIX ) 20 MG tablet Take 1 tablet (20 mg total) by mouth 2 (two) times daily before a meal. 180 tablet 2   Vitamin D , Ergocalciferol , (DRISDOL ) 1.25 MG (50000 UNIT) CAPS capsule Take 1 capsule (50,000 Units total) by mouth every 7 (seven) days. 12 capsule 3   No current facility-administered medications for this visit.     Past Surgical History:  Procedure Laterality Date   ABDOMINAL HYSTERECTOMY     BILATERAL OOPHORECTOMY  CHOLECYSTECTOMY       Allergies  Allergen Reactions   Biaxin [Clarithromycin]    Cephalexin    Ciprofloxacin    Doxycycline     Headaches   Flagyl [Metronidazole Hcl]    Ketek [Telithromycin]    Sulfa Antibiotics       Family History  Problem Relation Age of Onset   Colon cancer Sister    Stroke Mother    Heart disease Mother    Osteoporosis Mother    Parkinson's disease Father    Osteoporosis Father    Ovarian cancer Sister    Esophageal cancer Neg Hx    Stomach cancer Neg Hx    Kidney disease Neg Hx    Liver disease Neg Hx    Diabetes Neg Hx      Social History Susan Contreras reports that she has never smoked. She has never used smokeless tobacco. Susan Contreras reports no history of alcohol use.     Physical Examination Today's Vitals   03/21/24 1018  BP: 128/84  Pulse: 77  SpO2: 98%  Weight: 184 lb 6.4 oz (83.6 kg)  Height: 5' 2 (1.575 m)   Body mass index  is 33.73 kg/m.  Gen: resting comfortably, no acute distress HEENT: no scleral icterus, pupils equal round and reactive, no palptable cervical adenopathy,  CV: RRR, no m/r,g no jvd Resp: Clear to auscultation bilaterally GI: abdomen is soft, non-tender, non-distended, normal bowel sounds, no hepatosplenomegaly MSK: extremities are warm, no edema.  Skin: warm, no rash Neuro:  no focal deficits Psych: appropriate affect   Diagnostic Studies     Assessment and Plan  1. HTN - at goal continue current meds  2. HLD - LDL is reasonable, high TGs. She is not interested in additional medications. We discussed diet changes to lower TGs   EKG today shows SR, LAFB, isolated PVC  F/u 1 year      Dorn PHEBE Ross, M.D., F.A.C.C.

## 2024-03-21 NOTE — Patient Instructions (Signed)

## 2024-03-29 ENCOUNTER — Ambulatory Visit (INDEPENDENT_AMBULATORY_CARE_PROVIDER_SITE_OTHER): Admitting: Family

## 2024-03-29 ENCOUNTER — Encounter: Payer: Self-pay | Admitting: Family

## 2024-03-29 VITALS — BP 135/80 | HR 79 | Temp 98.0°F | Ht 62.0 in | Wt 185.0 lb

## 2024-03-29 DIAGNOSIS — J301 Allergic rhinitis due to pollen: Secondary | ICD-10-CM | POA: Diagnosis not present

## 2024-03-29 DIAGNOSIS — I10 Essential (primary) hypertension: Secondary | ICD-10-CM | POA: Diagnosis not present

## 2024-03-29 DIAGNOSIS — J0111 Acute recurrent frontal sinusitis: Secondary | ICD-10-CM | POA: Diagnosis not present

## 2024-03-29 DIAGNOSIS — K2100 Gastro-esophageal reflux disease with esophagitis, without bleeding: Secondary | ICD-10-CM | POA: Diagnosis not present

## 2024-03-29 MED ORDER — PANTOPRAZOLE SODIUM 40 MG PO TBEC: 40.0000 mg | DELAYED_RELEASE_TABLET | Freq: Two times a day (BID) | ORAL | 2 refills | Status: AC

## 2024-03-29 MED ORDER — CETIRIZINE HCL 10 MG PO TABS: 10.0000 mg | ORAL_TABLET | Freq: Every day | ORAL | 1 refills | Status: AC

## 2024-03-29 MED ORDER — AMOXICILLIN 500 MG PO CAPS: 500.0000 mg | ORAL_CAPSULE | Freq: Three times a day (TID) | ORAL | 0 refills | Status: AC

## 2024-03-29 MED ORDER — IRBESARTAN 75 MG PO TABS: 75.0000 mg | ORAL_TABLET | Freq: Every day | ORAL | 4 refills | Status: AC

## 2024-03-29 NOTE — Progress Notes (Addendum)
 Subjective:    Patient ID: Susan Contreras, female    DOB: 1940/11/27, 83 y.o.   MRN: [id number]  Chief Complaint  Patient presents with   Sinus Problem   Pt presents to the office today with sinus congestion, dizziness, and headaches that started 3 days ago and worsen.  She is taking Norel AD BID, flonase  nightly, and antivert  as needed.   Sinus Problem This is a recurrent problem. The current episode started in the past 7 days. The problem has been gradually worsening since onset. There has been no fever. Her pain is at a severity of 9/10. The pain is moderate. Associated symptoms include chills, congestion, coughing, ear pain, headaches, sinus pressure (left side worse), sneezing and a sore throat. Pertinent negatives include no shortness of breath. Past treatments include oral decongestants and nasal decongestants. The treatment provided mild relief.  Hypertension This is a chronic problem. The current episode started more than 1 year ago. The problem has been resolved since onset. The problem is controlled. Associated symptoms include headaches and malaise/fatigue. Pertinent negatives include no peripheral edema or shortness of breath. The current treatment provides moderate improvement.  Gastroesophageal Reflux She complains of belching, coughing, heartburn and a sore throat. This is a chronic problem. The current episode started more than 1 year ago. The problem occurs occasionally. The symptoms are aggravated by certain foods.      Review of Systems  Constitutional:  Positive for chills and malaise/fatigue.  HENT:  Positive for congestion, ear pain, sinus pressure (left side worse), sneezing and sore throat.   Respiratory:  Positive for cough. Negative for shortness of breath.   Gastrointestinal:  Positive for heartburn.  Neurological:  Positive for headaches.  All other systems reviewed and are negative.   Social History   Socioeconomic History   Marital status: Married     Spouse name: Not on file   Number of children: 2   Years of education: Not on file   Highest education level: Not on file  Occupational History   Occupation: Retired  Tobacco Use   Smoking status: Never   Smokeless tobacco: Never  Vaping Use   Vaping status: Never Used  Substance and Sexual Activity   Alcohol use: No   Drug use: No   Sexual activity: Not on file  Other Topics Concern   Not on file  Social History Narrative   2 caffeine drinks daily    Social Drivers of Health   Financial Resource Strain: Low Risk  (10/27/2022)   Overall Financial Resource Strain (CARDIA)    Difficulty of Paying Living Expenses: Not hard at all  Food Insecurity: No Food Insecurity (10/27/2022)   Hunger Vital Sign    Worried About Running Out of Food in the Last Year: Never true    Ran Out of Food in the Last Year: Never true  Transportation Needs: No Transportation Needs (10/27/2022)   PRAPARE - Administrator, Civil Service (Medical): No    Lack of Transportation (Non-Medical): No  Physical Activity: Insufficiently Active (10/27/2022)   Exercise Vital Sign    Days of Exercise per Week: 3 days    Minutes of Exercise per Session: 30 min  Stress: No Stress Concern Present (10/27/2022)   [employer] of Occupational Health - Occupational Stress Questionnaire    Feeling of Stress : Only a little  Social Connections: Moderately Integrated (10/27/2022)   Social Connection and Isolation Panel    Frequency of Communication with Friends and Family:  More than three times a week    Frequency of Social Gatherings with Friends and Family: More than three times a week    Attends Religious Services: More than 4 times per year    Active Member of [employer] or Organizations: No    Attends Engineer, structural: Never    Marital Status: Married   Family History  Problem Relation Age of Onset   Colon cancer Sister    Stroke Mother    Heart disease Mother    Osteoporosis Mother     Parkinson's disease Father    Osteoporosis Father    Ovarian cancer Sister    Esophageal cancer Neg Hx    Stomach cancer Neg Hx    Kidney disease Neg Hx    Liver disease Neg Hx    Diabetes Neg Hx         Objective:   Physical Exam Vitals reviewed.  Constitutional:      General: She is not in acute distress.    Appearance: She is well-developed.  HENT:     Head: Normocephalic and atraumatic.     Right Ear: Tympanic membrane normal.     Left Ear: Tympanic membrane normal.     Nose:     Right Sinus: Frontal sinus tenderness present.     Left Sinus: Frontal sinus tenderness present.  Eyes:     Pupils: Pupils are equal, round, and reactive to light.  Neck:     Thyroid : No thyromegaly.  Cardiovascular:     Rate and Rhythm: Normal rate and regular rhythm.     Heart sounds: Normal heart sounds. No murmur heard. Pulmonary:     Effort: Pulmonary effort is normal. No respiratory distress.     Breath sounds: Normal breath sounds. No wheezing.  Abdominal:     General: Bowel sounds are normal. There is no distension.     Palpations: Abdomen is soft.     Tenderness: There is no abdominal tenderness.  Musculoskeletal:        General: No tenderness. Normal range of motion.     Cervical back: Normal range of motion and neck supple.  Skin:    General: Skin is warm and dry.  Neurological:     Mental Status: She is alert and oriented to person, place, and time.     Cranial Nerves: No cranial nerve deficit.     Deep Tendon Reflexes: Reflexes are normal and symmetric.  Psychiatric:        Behavior: Behavior normal.        Thought Content: Thought content normal.        Judgment: Judgment normal.       BP 135/80   Pulse 79   Temp 98 F (36.7 C)   Ht 5' 2 (1.575 m)   Wt 185 lb (83.9 kg)   SpO2 95%   BMI 33.84 kg/m      Assessment & Plan:  Kymberlyn Eckford comes in today with chief complaint of Sinus Problem   Diagnosis and orders addressed:  1. Non-seasonal allergic  rhinitis due to pollen (Primary) - cetirizine  (ZYRTEC  ALLERGY) 10 MG tablet; Take 1 tablet (10 mg total) by mouth daily.  Dispense: 90 tablet; Refill: 1 - Ambulatory referral to ENT  2. Acute recurrent frontal sinusitis - Take meds as prescribed - Use a cool mist humidifier  -Use saline nose sprays frequently -Force fluids -For any cough or congestion  Use plain Mucinex- regular strength or max strength is fine -For  fever or aces or pains- take tylenol or ibuprofen. -Throat lozenges if help -Start Zyrtec   Referral to ENT pending  Will give Amoxicillin  500 mg TID. Pt can not tolerate Amoxicillin  875 mg, Augmentin , or doxycyline.  - Ambulatory referral to ENT - amoxicillin  (AMOXIL ) 500 MG capsule; Take 1 capsule (500 mg total) by mouth 3 (three) times daily for 10 days.  Dispense: 30 capsule; Refill: 0  3. Essential hypertension - irbesartan  (AVAPRO ) 75 MG tablet; Take 1 tablet (75 mg total) by mouth daily.  Dispense: 90 tablet; Refill: 4   4. Gastroesophageal reflux disease with esophagitis without hemorrhage Will increase Protonix  to 40 mg BID from 20 mg BID -Diet discussed- Avoid fried, spicy, citrus foods, caffeine and alcohol -Do not eat 2-3 hours before bedtime -Encouraged small frequent meals -Avoid NSAID's - pantoprazole  (PROTONIX ) 40 MG tablet; Take 1 tablet (40 mg total) by mouth 2 (two) times daily before a meal.  Dispense: 180 tablet; Refill: 2   Bari Learn, FNP

## 2024-03-29 NOTE — Patient Instructions (Signed)

## 2024-03-29 NOTE — Addendum Note (Signed)
 Addended by: LAVELL LYE A on: 03/29/2024 03:32 PM   Modules accepted: Orders

## 2024-04-27 ENCOUNTER — Ambulatory Visit: Admitting: Family Medicine

## 2024-04-27 ENCOUNTER — Encounter: Payer: Self-pay | Admitting: Family Medicine

## 2024-04-27 VITALS — BP 145/87 | HR 77 | Temp 96.5°F | Ht 62.0 in | Wt 185.0 lb

## 2024-04-27 DIAGNOSIS — L659 Nonscarring hair loss, unspecified: Secondary | ICD-10-CM

## 2024-04-27 DIAGNOSIS — J301 Allergic rhinitis due to pollen: Secondary | ICD-10-CM

## 2024-04-27 DIAGNOSIS — D72820 Lymphocytosis (symptomatic): Secondary | ICD-10-CM | POA: Diagnosis not present

## 2024-04-27 DIAGNOSIS — R5382 Chronic fatigue, unspecified: Secondary | ICD-10-CM | POA: Diagnosis not present

## 2024-04-27 NOTE — Progress Notes (Signed)
 Subjective: [name] hair loss PCP: Susan Contreras LABOR, FNP Susan Contreras is a 83 y.o. female presenting to clinic today for:  Discussed the use of AI scribe software for clinical note transcription with the patient, who gave verbal consent to proceed.  History of Present Illness   Susan Contreras is an 82 year old female with hypothyroidism who presents with fatigue and hair loss.  She has been experiencing fatigue and hair loss for at least four months. She has a history of hypothyroidism and is currently taking Synthroid , with no recent changes in the manufacturer or appearance of the medication. She often does not take her thyroid  medication on an empty stomach due to nausea.  She reports a scratchy throat and persistent ear pain, particularly in the left ear. She uses Zyrtec  for allergies, which she finds drying, and alternates it with Neoral for congestion. She sometimes takes both medications on the same day, which makes her feel 'weird'. She also uses Flonase  but not consistently.  Her family history includes cancer, with a sister who had colon cancer and another with ovarian cancer. She has had multiple colonoscopies in the past due to her family history but has not had one recently. She mentions a sister who died at 48 from colon cancer.  Denies family history of leukemia/lymphoma or other bone marrow disorders.  She has been feeling slowed down and has concerns about her heart, although a recent visit to a cardiologist indicated no issues. She experiences increased symptoms when exposed to outdoor allergens.      ROS: Per HPI  Allergies  Allergen Reactions   Biaxin [Clarithromycin]    Cephalexin    Ciprofloxacin    Doxycycline     Headaches   Flagyl [Metronidazole Hcl]    Ketek [Telithromycin]    Sulfa Antibiotics    Past Medical History:  Diagnosis Date   Anxiety    Depression    Diverticulosis    Esophageal stricture    Family hx of colorectal cancer    GERD  (gastroesophageal reflux disease)    Helicobacter pylori (H. pylori) 1997   EGD   Hemorrhoids    Hiatal hernia    Hx of adenomatous colonic polyps    Hyperlipemia    Hypertension    PUD (peptic ulcer disease) 1997   EGD   Thyroid  disease    Vertigo     Current Outpatient Medications:    ALPRAZolam  (XANAX ) 0.25 MG tablet, TAKE 1 TABLET TWICE DAILY AS NEEDED FOR ANXIETY, Disp: 60 tablet, Rfl: 2   atorvastatin  (LIPITOR) 20 MG tablet, Take 1 tablet (20 mg total) by mouth daily., Disp: 90 tablet, Rfl: 1   cetirizine  (ZYRTEC  ALLERGY) 10 MG tablet, Take 1 tablet (10 mg total) by mouth daily., Disp: 90 tablet, Rfl: 1   Chlorphen-PE-Acetaminophen (NOREL AD PO), Take 1 tablet by mouth as needed. , Disp: , Rfl:    Cholecalciferol (VITAMIN D3) 125 MCG (5000 UT) CAPS, Take 1 capsule (5,000 Units total) by mouth daily., Disp: 90 capsule, Rfl: 0   famotidine  (PEPCID ) 40 MG tablet, Take 1 tablet (40 mg total) by mouth daily., Disp: 90 tablet, Rfl: 1   fluticasone  (FLONASE ) 50 MCG/ACT nasal spray, PLACE 1 SPRAY INTO BOTH NOSTRILS DAILY, Disp: 16 g, Rfl: 5   irbesartan  (AVAPRO ) 75 MG tablet, Take 1 tablet (75 mg total) by mouth daily., Disp: 90 tablet, Rfl: 4   levothyroxine  (SYNTHROID ) 50 MCG tablet, TAKE ONE TABLET BY MOUTH ONCE DAILY, Disp: 90 tablet, Rfl: 2  meclizine  (ANTIVERT ) 25 MG tablet, TAKE ONE TABLET THREE TIMES DAILY AS NEEDED FOR DIZZINESS, Disp: 90 tablet, Rfl: 1   pantoprazole  (PROTONIX ) 40 MG tablet, Take 1 tablet (40 mg total) by mouth 2 (two) times daily before a meal., Disp: 180 tablet, Rfl: 2   Vitamin D , Ergocalciferol , (DRISDOL ) 1.25 MG (50000 UNIT) CAPS capsule, Take 1 capsule (50,000 Units total) by mouth every 7 (seven) days., Disp: 12 capsule, Rfl: 3 Social History   Socioeconomic History   Marital status: Married    Spouse name: Not on file   Number of children: 2   Years of education: Not on file   Highest education level: Not on file  Occupational History    Occupation: Retired  Tobacco Use   Smoking status: Never   Smokeless tobacco: Never  Vaping Use   Vaping status: Never Used  Substance and Sexual Activity   Alcohol use: No   Drug use: No   Sexual activity: Not on file  Other Topics Concern   Not on file  Social History Narrative   2 caffeine drinks daily    Social Drivers of Health   Financial Resource Strain: Low Risk  (10/27/2022)   Overall Financial Resource Strain (CARDIA)    Difficulty of Paying Living Expenses: Not hard at all  Food Insecurity: No Food Insecurity (10/27/2022)   Hunger Vital Sign    Worried About Running Out of Food in the Last Year: Never true    Ran Out of Food in the Last Year: Never true  Transportation Needs: No Transportation Needs (10/27/2022)   PRAPARE - Administrator, Civil Service (Medical): No    Lack of Transportation (Non-Medical): No  Physical Activity: Insufficiently Active (10/27/2022)   Exercise Vital Sign    Days of Exercise per Week: 3 days    Minutes of Exercise per Session: 30 min  Stress: No Stress Concern Present (10/27/2022)   [employer] of Occupational Health - Occupational Stress Questionnaire    Feeling of Stress : Only a little  Social Connections: Moderately Integrated (10/27/2022)   Social Connection and Isolation Panel    Frequency of Communication with Friends and Family: More than three times a week    Frequency of Social Gatherings with Friends and Family: More than three times a week    Attends Religious Services: More than 4 times per year    Active Member of [employer] or Organizations: No    Attends Banker Meetings: Never    Marital Status: Married  Catering manager Violence: Not At Risk (10/27/2022)   Humiliation, Afraid, Rape, and Kick questionnaire    Fear of Current or Ex-Partner: No    Emotionally Abused: No    Physically Abused: No    Sexually Abused: No   Family History  Problem Relation Age of Onset   Colon cancer Sister    Stroke  Mother    Heart disease Mother    Osteoporosis Mother    Parkinson's disease Father    Osteoporosis Father    Ovarian cancer Sister    Esophageal cancer Neg Hx    Stomach cancer Neg Hx    Kidney disease Neg Hx    Liver disease Neg Hx    Diabetes Neg Hx     Objective: Office vital signs reviewed. BP (!) 145/87   Pulse 77   Temp (!) 96.5 F (35.8 C)   Ht 5' 2 (1.575 m)   Wt 185 lb (83.9 kg)   SpO2  95%   BMI 33.84 kg/m   Physical Examination:  General: Awake, alert, well nourished, No acute distress HEENT: Normal    Neck: No masses palpated. No lymphadenopathy    Ears: Tympanic membranes intact, normal light reflex, no erythema, no bulging    Eyes: PERRLA, extraocular membranes intact, sclera white    Nose: nasal turbinates moist, no nasal discharge    Throat: moist mucus membranes  Cardio: regular rate and rhythm, S1S2 heard, no murmurs appreciated Pulm: clear to auscultation bilaterally, no wheezes, rhonchi or rales; normal work of breathing on room air    Assessment/ Plan: 83 y.o. female   Hair loss - Plan: TSH + free T4  Chronic fatigue - Plan: TSH + free T4  Lymphocytosis - Plan: Anemia Profile B, Lactate Dehydrogenase  Non-seasonal allergic rhinitis due to pollen  Assessment and Plan    Fatigue and hair loss evaluation w/ known hypothyroidism and lymphocytosis Differential diagnosis includes thyroid  dysfunction and anemia. Previous TSH test was done, but further evaluation is needed. - Order TSH and FT4 - Order anemia panel including B12, folic acid , and iron levels. - Order LDH to assess for cellular breakdown given lymphoctosis on labs over the years.  Will refer if labs unrevealing. - Educated on taking thyroid  medication alone, on an empty stomach, and without other medications to improve absorption.  Allergic rhinitis Symptoms include nasal dryness and scratchy throat. Current treatment includes Zyrtec  and Flonase , but Zyrtec  causes dryness. Symptoms  worsen with outdoor exposure. - Recommend trying half dose of Zyrtec  to reduce dryness. - Encourage regular use of Flonase  to help with mucus movement.      Norene CHRISTELLA Fielding, DO [facility] [phone]

## 2024-04-27 NOTE — Patient Instructions (Addendum)
 We'll be in touch with lab results once they are available.  We talked about possible referral to hematology for that abnormality we saw on your CBC tests.

## 2024-04-28 ENCOUNTER — Ambulatory Visit: Payer: Self-pay | Admitting: Family Medicine

## 2024-04-28 LAB — ANEMIA PROFILE B
Basophils Absolute: 0.1 x10E3/uL (ref 0.0–0.2)
Basos: 1 %
EOS (ABSOLUTE): 0.2 x10E3/uL (ref 0.0–0.4)
Eos: 3 %
Ferritin: 34 ng/mL (ref 15–150)
Folate: 5.9 ng/mL (ref >3.0)
Hematocrit: 39.6 % (ref 34.0–46.6)
Hemoglobin: 13 g/dL (ref 11.1–15.9)
Immature Grans (Abs): 0 x10E3/uL (ref 0.0–0.1)
Immature Granulocytes: 0 %
Iron Saturation: 15 (ref 15–55)
Iron: 53 ug/dL (ref 27–139)
Lymphocytes Absolute: 3.3 x10E3/uL — ABNORMAL HIGH (ref 0.7–3.1)
Lymphs: 39 %
MCH: 29.9 pg (ref 26.6–33.0)
MCHC: 32.8 g/dL (ref 31.5–35.7)
MCV: 91 fL (ref 79–97)
Monocytes Absolute: 0.8 x10E3/uL (ref 0.1–0.9)
Monocytes: 9 %
Neutrophils Absolute: 4 x10E3/uL (ref 1.4–7.0)
Neutrophils: 48 %
Platelets: 280 x10E3/uL (ref 150–450)
RBC: 4.35 x10E6/uL (ref 3.77–5.28)
RDW: 13.4 % (ref 11.7–15.4)
Retic Ct Pct: 1.4 % (ref 0.6–2.6)
Total Iron Binding Capacity: 344 ug/dL (ref 250–450)
UIBC: 291 ug/dL (ref 118–369)
Vitamin B-12: 223 pg/mL — AB (ref 232–1245)
WBC: 8.5 x10E3/uL (ref 3.4–10.8)

## 2024-04-28 LAB — LACTATE DEHYDROGENASE: LDH: 183 IU/L (ref 119–226)

## 2024-04-28 LAB — TSH+FREE T4
Free T4: 1.26 ng/dL (ref 0.82–1.77)
TSH: 3.06 u[IU]/mL (ref 0.450–4.500)

## 2024-05-02 ENCOUNTER — Ambulatory Visit (INDEPENDENT_AMBULATORY_CARE_PROVIDER_SITE_OTHER): Admitting: *Deleted

## 2024-05-02 DIAGNOSIS — E538 Deficiency of other specified B group vitamins: Secondary | ICD-10-CM

## 2024-05-02 MED ORDER — CYANOCOBALAMIN 1000 MCG/ML IJ SOLN
1000.0000 ug | INTRAMUSCULAR | Status: AC
  Administered 2024-06-22: 1000 ug via INTRAMUSCULAR

## 2024-05-02 MED ORDER — CYANOCOBALAMIN 1000 MCG/ML IJ SOLN
1000.0000 ug | INTRAMUSCULAR | Status: AC
  Administered 2024-05-02 – 2024-05-23 (×4): 1000 ug via INTRAMUSCULAR

## 2024-05-02 NOTE — Progress Notes (Signed)
 Patient is in office today for a nurse visit for B12 Injection. Patient Injection was given in the  Right deltoid. Patient tolerated injection well.

## 2024-05-07 ENCOUNTER — Other Ambulatory Visit: Payer: Self-pay | Admitting: Family

## 2024-05-07 DIAGNOSIS — E039 Hypothyroidism, unspecified: Secondary | ICD-10-CM

## 2024-05-09 ENCOUNTER — Ambulatory Visit (INDEPENDENT_AMBULATORY_CARE_PROVIDER_SITE_OTHER): Admitting: *Deleted

## 2024-05-09 DIAGNOSIS — E538 Deficiency of other specified B group vitamins: Secondary | ICD-10-CM | POA: Diagnosis not present

## 2024-05-09 NOTE — Progress Notes (Signed)
 Patient is in office today for a nurse visit for B12 Injection. Patient Injection was given in the  Left deltoid. Patient tolerated injection well.

## 2024-05-16 ENCOUNTER — Ambulatory Visit

## 2024-05-17 ENCOUNTER — Ambulatory Visit (INDEPENDENT_AMBULATORY_CARE_PROVIDER_SITE_OTHER): Admitting: *Deleted

## 2024-05-17 DIAGNOSIS — E538 Deficiency of other specified B group vitamins: Secondary | ICD-10-CM

## 2024-05-23 ENCOUNTER — Ambulatory Visit (INDEPENDENT_AMBULATORY_CARE_PROVIDER_SITE_OTHER): Admitting: *Deleted

## 2024-05-23 DIAGNOSIS — E538 Deficiency of other specified B group vitamins: Secondary | ICD-10-CM

## 2024-05-23 NOTE — Progress Notes (Signed)
 Patient is in office today for a nurse visit for B12 Injection. Patient Injection was given in the  Left deltoid. Patient tolerated injection well.

## 2024-06-08 ENCOUNTER — Ambulatory Visit: Payer: Self-pay

## 2024-06-08 NOTE — Telephone Encounter (Signed)
 Apt scheduled.

## 2024-06-08 NOTE — Telephone Encounter (Signed)
 FYI Only or Action Required?: FYI only for provider.  Patient was last seen in primary care on 04/27/2024 by Jolinda Norene HERO, DO.  Called Nurse Triage reporting Sinusitis.  Symptoms began several days ago.  Interventions attempted: OTC medications: decongestants.  Symptoms are: gradually worsening.  Triage Disposition: Home Care  Patient/caregiver understands and will follow disposition?: No, wishes to speak with PCP Copied from CRM [id number]. Topic: Clinical - Red Word Triage >> Jun 08, 2024  9:49 AM Terri MATSU wrote: Red Word that prompted transfer to Nurse Triage: Patient think she may have a sinus infection, she's experiencing brain fog, not feeling good, bad congested, bad headache and ear ache. Reason for Disposition  [1] Sinus congestion as part of a cold AND [2] present < 10 days  Answer Assessment - Initial Assessment Questions Taking sinus medicine and norell Also has this flare up at this time each year Had a tooth pulled on the same side last week Has called dentist and is going to be seen there to r/o complication  1. LOCATION: Where does it hurt?      Left face 2. ONSET: When did the sinus pain start?  (e.g., hours, days)      About 4 days ago 3. SEVERITY: How bad is the pain?   (Scale 0-10; or none, mild, moderate or severe)     Pain to face, left temple and left ear 4. RECURRENT SYMPTOM: Have you ever had sinus problems before? If Yes, ask: When was the last time? and What happened that time?      Recurrent sinus problems 5. NASAL CONGESTION: Is the nose blocked? If Yes, ask: Can you open it or must you breathe through your mouth?     Present, stuffy and blowing nose often 6. NASAL DISCHARGE: Do you have discharge from your nose? If so ask, What color?     clear 7. FEVER: Do you have a fever? If Yes, ask: What is it, how was it measured, and when did it start?      Chills, no measured fever 8. OTHER SYMPTOMS: Do you have any other  symptoms? (e.g., sore throat, cough, earache, difficulty breathing)     Ear pain, jaw pain 9. PREGNANCY: Is there any chance you are pregnant? When was your last menstrual period?     N/a  Protocols used: Sinus Pain or Congestion-A-AH

## 2024-06-09 ENCOUNTER — Ambulatory Visit

## 2024-06-09 VITALS — BP 145/87 | HR 77 | Ht 62.0 in | Wt 185.0 lb

## 2024-06-09 DIAGNOSIS — Z0000 Encounter for general adult medical examination without abnormal findings: Secondary | ICD-10-CM

## 2024-06-09 NOTE — Progress Notes (Signed)
 Subjective:   Susan Contreras is a 83 y.o. who presents for a Medicare Wellness preventive visit.  As a reminder, Annual Wellness Visits don't include a physical exam, and some assessments may be limited, especially if this visit is performed virtually. We may recommend an in-person follow-up visit with your provider if needed.  Visit Complete: Virtual I connected with  Susan Contreras on 06/09/24 by a audio enabled telemedicine application and verified that I am speaking with the correct person using two identifiers.  Patient Location: Home  Provider Location: Home Office  I discussed the limitations of evaluation and management by telemedicine. The patient expressed understanding and agreed to proceed.  Vital Signs: Because this visit was a virtual/telehealth visit, some criteria may be missing or patient reported. Any vitals not documented were not able to be obtained and vitals that have been documented are patient reported.  VideoDeclined- This patient declined Librarian, academic. Therefore the visit was completed with audio only.  Persons Participating in Visit: Patient.  AWV Questionnaire: No: Patient Medicare AWV questionnaire was not completed prior to this visit.  Cardiac Risk Factors include: advanced age (>52men, >11 women);dyslipidemia;hypertension;obesity (BMI >30kg/m2);smoking/ tobacco exposure     Objective:    There were no vitals filed for this visit. There is no height or weight on file to calculate BMI.     06/09/2024   10:56 AM 11/17/2023   11:35 AM 10/27/2022    6:19 PM 10/29/2019    3:23 PM 10/19/2019   11:16 AM 06/26/2019    2:30 PM 05/31/2019   10:55 AM  Advanced Directives  Does Patient Have a Medical Advance Directive? No No No No No No No  Would patient like information on creating a medical advance directive?   Yes (MAU/Ambulatory/Procedural Areas - Information given)  No - Patient declined No - Patient declined No - Patient  declined    Current Medications (verified) Outpatient Encounter Medications as of 06/09/2024  Medication Sig   ALPRAZolam  (XANAX ) 0.25 MG tablet TAKE 1 TABLET TWICE DAILY AS NEEDED FOR ANXIETY   atorvastatin  (LIPITOR) 20 MG tablet Take 1 tablet (20 mg total) by mouth daily.   cetirizine  (ZYRTEC  ALLERGY) 10 MG tablet Take 1 tablet (10 mg total) by mouth daily.   Chlorphen-PE-Acetaminophen (NOREL AD PO) Take 1 tablet by mouth as needed.    Cholecalciferol (VITAMIN D3) 125 MCG (5000 UT) CAPS Take 1 capsule (5,000 Units total) by mouth daily.   famotidine  (PEPCID ) 40 MG tablet Take 1 tablet (40 mg total) by mouth daily.   fluticasone  (FLONASE ) 50 MCG/ACT nasal spray PLACE 1 SPRAY INTO BOTH NOSTRILS DAILY   irbesartan  (AVAPRO ) 75 MG tablet Take 1 tablet (75 mg total) by mouth daily.   levothyroxine  (SYNTHROID ) 50 MCG tablet TAKE ONE TABLET BY MOUTH ONCE DAILY   meclizine  (ANTIVERT ) 25 MG tablet TAKE ONE TABLET THREE TIMES DAILY AS NEEDED FOR DIZZINESS   pantoprazole  (PROTONIX ) 40 MG tablet Take 1 tablet (40 mg total) by mouth 2 (two) times daily before a meal.   Vitamin D , Ergocalciferol , (DRISDOL ) 1.25 MG (50000 UNIT) CAPS capsule Take 1 capsule (50,000 Units total) by mouth every 7 (seven) days.   Facility-Administered Encounter Medications as of 06/09/2024  Medication   [START ON 06/22/2024] cyanocobalamin  (VITAMIN B12) injection 1,000 mcg    Allergies (verified) Biaxin [clarithromycin], Cephalexin, Ciprofloxacin, Doxycycline, Flagyl [metronidazole hcl], Ketek [telithromycin], and Sulfa antibiotics   History: Past Medical History:  Diagnosis Date   Anxiety    Depression  Diverticulosis    Esophageal stricture    Family hx of colorectal cancer    GERD (gastroesophageal reflux disease)    Helicobacter pylori (H. pylori) 1997   EGD   Hemorrhoids    Hiatal hernia    Hx of adenomatous colonic polyps    Hyperlipemia    Hypertension    PUD (peptic ulcer disease) 1997   EGD    Thyroid  disease    Vertigo    Past Surgical History:  Procedure Laterality Date   ABDOMINAL HYSTERECTOMY     BILATERAL OOPHORECTOMY     CHOLECYSTECTOMY     Family History  Problem Relation Age of Onset   Colon cancer Sister    Stroke Mother    Heart disease Mother    Osteoporosis Mother    Parkinson's disease Father    Osteoporosis Father    Ovarian cancer Sister    Esophageal cancer Neg Hx    Stomach cancer Neg Hx    Kidney disease Neg Hx    Liver disease Neg Hx    Diabetes Neg Hx    Social History   Socioeconomic History   Marital status: Married    Spouse name: Not on file   Number of children: 2   Years of education: Not on file   Highest education level: Not on file  Occupational History   Occupation: Retired  Tobacco Use   Smoking status: Never   Smokeless tobacco: Never  Vaping Use   Vaping status: Never Used  Substance and Sexual Activity   Alcohol use: No   Drug use: No   Sexual activity: Not on file  Other Topics Concern   Not on file  Social History Narrative   2 caffeine drinks daily    Social Drivers of Health   Financial Resource Strain: Low Risk  (06/09/2024)   Overall Financial Resource Strain (CARDIA)    Difficulty of Paying Living Expenses: Not hard at all  Food Insecurity: No Food Insecurity (06/09/2024)   Hunger Vital Sign    Worried About Running Out of Food in the Last Year: Never true    Ran Out of Food in the Last Year: Never true  Transportation Needs: No Transportation Needs (06/09/2024)   PRAPARE - Administrator, Civil Service (Medical): No    Lack of Transportation (Non-Medical): No  Physical Activity: Insufficiently Active (06/09/2024)   Exercise Vital Sign    Days of Exercise per Week: 3 days    Minutes of Exercise per Session: 30 min  Stress: No Stress Concern Present (06/09/2024)   [employer] of Occupational Health - Occupational Stress Questionnaire    Feeling of Stress: Only a little  Social  Connections: Moderately Integrated (06/09/2024)   Social Connection and Isolation Panel    Frequency of Communication with Friends and Family: More than three times a week    Frequency of Social Gatherings with Friends and Family: More than three times a week    Attends Religious Services: More than 4 times per year    Active Member of [employer] or Organizations: No    Attends Engineer, structural: Never    Marital Status: Married    Tobacco Counseling Counseling given: Yes    Clinical Intake:  Pre-visit preparation completed: Yes  Pain : No/denies pain     Nutritional Risks: None Diabetes: No  No results found for: HGBA1C   How often do you need to have someone help you when you read instructions, pamphlets, or  other written materials from your doctor or pharmacy?: 1 - Never  Interpreter Needed?: No  Information entered by :: alia t/cma   Activities of Daily Living     06/09/2024   10:52 AM  In your present state of health, do you have any difficulty performing the following activities:  Hearing? 0  Vision? 0  Difficulty concentrating or making decisions? 0  Walking or climbing stairs? 0  Dressing or bathing? 0  Doing errands, shopping? 0  Preparing Food and eating ? N  Using the Toilet? N  In the past six months, have you accidently leaked urine? N  Do you have problems with loss of bowel control? N  Managing your Medications? N  Managing your Finances? N  Housekeeping or managing your Housekeeping? N    Patient Care Team: [name] LABOR, FNP as PCP - General (Nurse Practitioner) Alvan Dorn FALCON, MD as PCP - Cardiology (Cardiology)  I have updated your Care Teams any recent Medical Services you may have received from other providers in the past year.     Assessment:   This is a routine wellness examination for Susan Contreras.  Hearing/Vision screen Hearing Screening - Comments:: Some hearing dif Vision Screening - Comments:: Pt wear glasses/pt  goes to [facility] in in [place], [place]/last ov 2024   Goals Addressed             This Visit's Progress    Remain active and independent   On track      Depression Screen     06/09/2024   10:57 AM 04/27/2024   10:05 AM 03/29/2024    3:14 PM 09/30/2023   11:14 AM 08/10/2023    9:45 AM 07/22/2023    9:40 AM 07/08/2023    1:11 PM  PHQ 2/9 Scores  PHQ - 2 Score 0  0  0 0   PHQ- 9 Score   0  0    Exception Documentation  Patient refusal  Patient refusal   Patient refusal    Fall Risk     06/09/2024   10:51 AM 03/29/2024    3:14 PM 08/10/2023    9:45 AM 07/22/2023    9:40 AM 04/28/2023   12:05 PM  Fall Risk   Falls in the past year? 0 0 0 0 0  Number falls in past yr: 0 0 0    Injury with Fall? 0 0 0    Risk for fall due to : No Fall Risks No Fall Risks No Fall Risks    Follow up Falls evaluation completed Falls evaluation completed Falls evaluation completed;Education provided      MEDICARE RISK AT HOME:  Medicare Risk at Home Any stairs in or around the home?: No If so, are there any without handrails?: No Home free of loose throw rugs in walkways, pet beds, electrical cords, etc?: Yes Adequate lighting in your home to reduce risk of falls?: Yes Life alert?: No Use of a cane, walker or w/c?: No Grab bars in the bathroom?: Yes Shower chair or bench in shower?: Yes Elevated toilet seat or a handicapped toilet?: Yes  TIMED UP AND GO:  Was the test performed?  no  Cognitive Function: 6CIT completed        06/09/2024   11:07 AM 10/27/2022    6:20 PM  6CIT Screen  What Year? 0 points 0 points  What month? 0 points 0 points  What time? 0 points 0 points  Count back from 20 0 points  0 points  Months in reverse 0 points 0 points  Repeat phrase 6 points 0 points  Total Score 6 points 0 points    Immunizations Immunization History  Administered Date(s) Administered   Moderna Sars-Covid-2 Vaccination 03/06/2020, 04/03/2020   PNEUMOCOCCAL CONJUGATE-20 10/29/2021    Pneumococcal Conjugate-13 08/07/2020   Tdap 03/15/2019    Screening Tests Health Maintenance  Topic Date Due   Zoster Vaccines- Shingrix (1 of 2) Never done   COVID-19 Vaccine (3 - Moderna risk series) 05/01/2020   Mammogram  10/16/2023   Influenza Vaccine  11/22/2024 (Originally 03/25/2024)   Medicare Annual Wellness (AWV)  06/09/2025   DEXA SCAN  12/01/2025   DTaP/Tdap/Td (2 - Td or Tdap) 03/14/2029   Pneumococcal Vaccine: 50+ Years  Completed   Meningococcal B Vaccine  Aged Out   Hepatitis C Screening  Discontinued    Health Maintenance Items Addressed: See Nurse Notes at the end of this note  Additional Screening:  Vision Screening: Recommended annual ophthalmology exams for early detection of glaucoma and other disorders of the eye. Is the patient up to date with their annual eye exam?  Yes  Who is the provider or what is the name of the office in which the patient attends annual eye exams? [facility] in [place], [place]  Dental Screening: Recommended annual dental exams for proper oral hygiene  Community Resource Referral / Chronic Care Management: CRR required this visit?  No   CCM required this visit?  No   Plan:    I have personally reviewed and noted the following in the patient's chart:   Medical and social history Use of alcohol, tobacco or illicit drugs  Current medications and supplements including opioid prescriptions. Patient is not currently taking opioid prescriptions. Functional ability and status Nutritional status Physical activity Advanced directives List of other physicians Hospitalizations, surgeries, and ER visits in previous 12 months Vitals Screenings to include cognitive, depression, and falls Referrals and appointments  In addition, I have reviewed and discussed with patient certain preventive protocols, quality metrics, and best practice recommendations. A written personalized care plan for preventive services as well as general preventive health  recommendations were provided to patient.   Ozie Ned, CMA   06/09/2024   After Visit Summary: (MyChart) Due to this being a telephonic visit, the after visit summary with patients personalized plan was offered to patient via MyChart   Notes: Nothing significant to report at this time.

## 2024-06-09 NOTE — Patient Instructions (Signed)
 Susan Contreras,  Thank you for taking the time for your Medicare Wellness Visit. I appreciate your continued commitment to your health goals. Please review the care plan we discussed, and feel free to reach out if I can assist you further.  Medicare recommends these wellness visits once per year to help you and your care team stay ahead of potential health issues. These visits are designed to focus on prevention, allowing your provider to concentrate on managing your acute and chronic conditions during your regular appointments.  Please note that Annual Wellness Visits do not include a physical exam. Some assessments may be limited, especially if the visit was conducted virtually. If needed, we may recommend a separate in-person follow-up with your provider.  Ongoing Care Seeing your primary care provider every 3 to 6 months helps us  monitor your health and provide consistent, personalized care.   Referrals If a referral was made during today's visit and you haven't received any updates within two weeks, please contact the referred provider directly to check on the status.  Recommended Screenings:  Health Maintenance  Topic Date Due   Zoster (Shingles) Vaccine (1 of 2) Never done   COVID-19 Vaccine (3 - Moderna risk series) 05/01/2020   Breast Cancer Screening  10/16/2023   Medicare Annual Wellness Visit  10/27/2023   Flu Shot  11/22/2024*   DEXA scan (bone density measurement)  12/01/2025   DTaP/Tdap/Td vaccine (2 - Td or Tdap) 03/14/2029   Pneumococcal Vaccine for age over 16  Completed   Meningitis B Vaccine  Aged Out   Hepatitis C Screening  Discontinued  *Topic was postponed. The date shown is not the original due date.       06/09/2024   10:56 AM  Advanced Directives  Does Patient Have a Medical Advance Directive? No   Advance Care Planning is important because it: Ensures you receive medical care that aligns with your values, goals, and preferences. Provides guidance to your  family and loved ones, reducing the emotional burden of decision-making during critical moments.  Vision: Annual vision screenings are recommended for early detection of glaucoma, cataracts, and diabetic retinopathy. These exams can also reveal signs of chronic conditions such as diabetes and high blood pressure.  Dental: Annual dental screenings help detect early signs of oral cancer, gum disease, and other conditions linked to overall health, including heart disease and diabetes.  Please see the attached documents for additional preventive care recommendations.

## 2024-06-10 ENCOUNTER — Encounter: Payer: Self-pay | Admitting: Family

## 2024-06-10 ENCOUNTER — Ambulatory Visit: Admitting: Family

## 2024-06-10 VITALS — BP 130/73 | HR 75 | Temp 97.0°F | Ht 62.0 in | Wt 186.0 lb

## 2024-06-10 DIAGNOSIS — K219 Gastro-esophageal reflux disease without esophagitis: Secondary | ICD-10-CM

## 2024-06-10 DIAGNOSIS — Z79899 Other long term (current) drug therapy: Secondary | ICD-10-CM | POA: Diagnosis not present

## 2024-06-10 DIAGNOSIS — I159 Secondary hypertension, unspecified: Secondary | ICD-10-CM | POA: Diagnosis not present

## 2024-06-10 DIAGNOSIS — E039 Hypothyroidism, unspecified: Secondary | ICD-10-CM | POA: Diagnosis not present

## 2024-06-10 DIAGNOSIS — F411 Generalized anxiety disorder: Secondary | ICD-10-CM | POA: Diagnosis not present

## 2024-06-10 DIAGNOSIS — E559 Vitamin D deficiency, unspecified: Secondary | ICD-10-CM | POA: Diagnosis not present

## 2024-06-10 DIAGNOSIS — E785 Hyperlipidemia, unspecified: Secondary | ICD-10-CM | POA: Diagnosis not present

## 2024-06-10 DIAGNOSIS — F1320 Sedative, hypnotic or anxiolytic dependence, uncomplicated: Secondary | ICD-10-CM | POA: Diagnosis not present

## 2024-06-10 DIAGNOSIS — G4700 Insomnia, unspecified: Secondary | ICD-10-CM | POA: Diagnosis not present

## 2024-06-10 DIAGNOSIS — N1832 Chronic kidney disease, stage 3b: Secondary | ICD-10-CM

## 2024-06-10 DIAGNOSIS — J329 Chronic sinusitis, unspecified: Secondary | ICD-10-CM

## 2024-06-10 MED ORDER — ALPRAZOLAM 0.25 MG PO TABS: ORAL_TABLET | ORAL | 2 refills | Status: AC

## 2024-06-10 MED ORDER — AMOXICILLIN 500 MG PO CAPS: 500.0000 mg | ORAL_CAPSULE | Freq: Two times a day (BID) | ORAL | 0 refills | Status: AC

## 2024-06-10 NOTE — Progress Notes (Signed)
 Subjective:    Patient ID: Susan Contreras, female    DOB: October 02, 1940, 83 y.o.   MRN: [id number]  Chief Complaint  Patient presents with   Sinusitis   Pt calls the office today for chronic follow up.   She has CKD and avoid NSAID's.    She is obese with a BMI of 34 with HTN and GERD.   Hypertension This is a chronic problem. The current episode started more than 1 year ago. The problem has been resolved since onset. The problem is controlled. Associated symptoms include headaches and malaise/fatigue. Pertinent negatives include no peripheral edema or shortness of breath. Risk factors for coronary artery disease include dyslipidemia, sedentary lifestyle and obesity. The current treatment provides moderate improvement. Identifiable causes of hypertension include a thyroid  problem.  Gastroesophageal Reflux She complains of belching, coughing, heartburn and a sore throat. This is a chronic problem. The current episode started more than 1 year ago. The problem occurs occasionally. The symptoms are aggravated by medications. Associated symptoms include fatigue. Risk factors include obesity. She has tried a PPI for the symptoms. The treatment provided moderate relief.  Thyroid  Problem Presents for follow-up visit. Symptoms include anxiety and fatigue. Patient reports no constipation, depressed mood, diarrhea or dry skin. The symptoms have been stable.  Insomnia Primary symptoms: difficulty falling asleep, malaise/fatigue.   The current episode started more than one year. The problem occurs intermittently. Past treatments include medication. The treatment provided moderate relief.  Hyperlipidemia This is a chronic problem. The current episode started more than 1 year ago. The problem is uncontrolled. Recent lipid tests were reviewed and are high. Exacerbating diseases include obesity. Pertinent negatives include no shortness of breath. Current antihyperlipidemic treatment includes statins. The current  treatment provides moderate improvement of lipids. Risk factors for coronary artery disease include dyslipidemia, hypertension, a sedentary lifestyle, post-menopausal and obesity.  Anxiety Presents for follow-up visit. Symptoms include excessive worry, nervous/anxious behavior and restlessness. Patient reports no depressed mood or shortness of breath. Symptoms occur rarely. The severity of symptoms is moderate.    Sinusitis This is a recurrent problem. The current episode started in the past 7 days. The problem has been gradually worsening since onset. There has been no fever. Her pain is at a severity of 8/10. Associated symptoms include congestion, coughing, ear pain (left), headaches, sinus pressure, sneezing and a sore throat. Pertinent negatives include no shortness of breath. Past treatments include oral decongestants and nasal decongestants. The treatment provided mild relief.      Review of Systems  Constitutional:  Positive for fatigue and malaise/fatigue.  HENT:  Positive for congestion, ear pain (left), sinus pressure, sneezing and sore throat.   Respiratory:  Positive for cough. Negative for shortness of breath.   Gastrointestinal:  Positive for heartburn. Negative for constipation and diarrhea.  Neurological:  Positive for headaches.  Psychiatric/Behavioral:  The patient is nervous/anxious.   All other systems reviewed and are negative.   Family History  Problem Relation Age of Onset   Colon cancer Sister    Stroke Mother    Heart disease Mother    Osteoporosis Mother    Parkinson's disease Father    Osteoporosis Father    Ovarian cancer Sister    Esophageal cancer Neg Hx    Stomach cancer Neg Hx    Kidney disease Neg Hx    Liver disease Neg Hx    Diabetes Neg Hx    Social History   Socioeconomic History   Marital status: Married  Spouse name: Not on file   Number of children: 2   Years of education: Not on file   Highest education level: Not on file   Occupational History   Occupation: Retired  Tobacco Use   Smoking status: Never   Smokeless tobacco: Never  Vaping Use   Vaping status: Never Used  Substance and Sexual Activity   Alcohol use: No   Drug use: No   Sexual activity: Not on file  Other Topics Concern   Not on file  Social History Narrative   2 caffeine drinks daily    Social Drivers of Health   Financial Resource Strain: Low Risk  (06/09/2024)   Overall Financial Resource Strain (CARDIA)    Difficulty of Paying Living Expenses: Not hard at all  Food Insecurity: No Food Insecurity (06/09/2024)   Hunger Vital Sign    Worried About Running Out of Food in the Last Year: Never true    Ran Out of Food in the Last Year: Never true  Transportation Needs: No Transportation Needs (06/09/2024)   PRAPARE - Administrator, Civil Service (Medical): No    Lack of Transportation (Non-Medical): No  Physical Activity: Insufficiently Active (06/09/2024)   Exercise Vital Sign    Days of Exercise per Week: 3 days    Minutes of Exercise per Session: 30 min  Stress: No Stress Concern Present (06/09/2024)   [employer] of Occupational Health - Occupational Stress Questionnaire    Feeling of Stress: Only a little  Social Connections: Moderately Integrated (06/09/2024)   Social Connection and Isolation Panel    Frequency of Communication with Friends and Family: More than three times a week    Frequency of Social Gatherings with Friends and Family: More than three times a week    Attends Religious Services: More than 4 times per year    Active Member of [employer] or Organizations: No    Attends Banker Meetings: Never    Marital Status: Married       Objective:   Physical Exam Vitals reviewed.  Constitutional:      General: She is not in acute distress.    Appearance: She is well-developed. She is obese.  HENT:     Head: Normocephalic and atraumatic.     Right Ear: Tympanic membrane normal.      Left Ear: Tympanic membrane normal.     Nose:     Right Sinus: Maxillary sinus tenderness present.     Left Sinus: Maxillary sinus tenderness present.     Mouth/Throat:     Pharynx: Posterior oropharyngeal erythema present.  Eyes:     Pupils: Pupils are equal, round, and reactive to light.  Neck:     Thyroid : No thyromegaly.  Cardiovascular:     Rate and Rhythm: Regular rhythm.     Heart sounds: Normal heart sounds. No murmur heard. Pulmonary:     Effort: Pulmonary effort is normal. No respiratory distress.     Breath sounds: Normal breath sounds. No wheezing.  Abdominal:     General: Bowel sounds are normal. There is no distension.     Palpations: Abdomen is soft.     Tenderness: There is no abdominal tenderness.  Musculoskeletal:        General: No tenderness. Normal range of motion.     Cervical back: Normal range of motion and neck supple.  Skin:    General: Skin is warm and dry.  Neurological:     Mental Status: She  is alert and oriented to person, place, and time.     Cranial Nerves: No cranial nerve deficit.     Deep Tendon Reflexes: Reflexes are normal and symmetric.  Psychiatric:        Behavior: Behavior normal.        Thought Content: Thought content normal.        Judgment: Judgment normal.       BP 130/73   Pulse 75   Temp (!) 97 F (36.1 C) (Temporal)   Ht 5' 2 (1.575 m)   Wt 186 lb (84.4 kg)   BMI 34.02 kg/m      Assessment & Plan:   Dominick Zertuche comes in today with chief complaint of Sinusitis   Diagnosis and orders addressed:  1. GAD (generalized anxiety disorder) - ALPRAZolam  (XANAX ) 0.25 MG tablet; TAKE 1 TABLET TWICE DAILY AS NEEDED FOR ANXIETY  Dispense: 60 tablet; Refill: 2 - CMP14+EGFR - CBC with Differential/Platelet  2. Benzodiazepine dependence (HCC) - ALPRAZolam  (XANAX ) 0.25 MG tablet; TAKE 1 TABLET TWICE DAILY AS NEEDED FOR ANXIETY  Dispense: 60 tablet; Refill: 2 - CMP14+EGFR - CBC with Differential/Platelet  3. Controlled  substance agreement signed  - ALPRAZolam  (XANAX ) 0.25 MG tablet; TAKE 1 TABLET TWICE DAILY AS NEEDED FOR ANXIETY  Dispense: 60 tablet; Refill: 2 - CMP14+EGFR - CBC with Differential/Platelet  4. Morbid obesity (HCC) - CMP14+EGFR - CBC with Differential/Platelet  5. CKD stage 3b, GFR 30-44 ml/min (HCC) - CMP14+EGFR - CBC with Differential/Platelet  6. Vitamin D  deficiency  - CMP14+EGFR - CBC with Differential/Platelet  7. Insomnia, unspecified type  - CMP14+EGFR - CBC with Differential/Platelet  8. Hypothyroidism, unspecified type (Primary - CMP14+EGFR - TSH - CBC with Differential/Platelet  9. Secondary hypertension - CMP14+EGFR - CBC with Differential/Platelet  10. Hyperlipidemia, unspecified hyperlipidemia type  - CMP14+EGFR - CBC with Differential/Platelet  11. Gastroesophageal reflux disease, unspecified whether esophagitis present - CMP14+EGFR - CBC with Differential/Platelet  12. Recurrent sinusitis PT continue OTC medication Amoxicillin  Prescription sent to pharmacy, but will not start until day 6-7 , can not tolerate higher dose amoxicillin  - Take meds as prescribed - Use a cool mist humidifier  -Use saline nose sprays frequently -Force fluids -For any cough or congestion  Use plain Mucinex- regular strength or max strength is fine -For fever or aces or pains- take tylenol or ibuprofen. -Throat lozenges if help -Refuses ENT or allergen referral at this time - amoxicillin  (AMOXIL ) 500 MG capsule; Take 1 capsule (500 mg total) by mouth 2 (two) times daily for 10 days.  Dispense: 20 capsule; Refill: 0  Labs pending  Patient reviewed in [place] controlled database, no flags noted. Contract and drug screen up dated today.  Continue current medications  Health Maintenance reviewed Diet and exercise encouraged  Follow up plan: 3 months    Bari Learn, FNP

## 2024-06-10 NOTE — Patient Instructions (Signed)

## 2024-06-11 LAB — CMP14+EGFR
ALT: 8 IU/L (ref 0–32)
AST: 15 IU/L (ref 0–40)
Albumin: 4.4 g/dL (ref 3.7–4.7)
Alkaline Phosphatase: 86 IU/L (ref 48–129)
BUN/Creatinine Ratio: 9 — ABNORMAL LOW (ref 12–28)
BUN: 14 mg/dL (ref 8–27)
Bilirubin Total: 0.6 mg/dL (ref 0.0–1.2)
CO2: 21 mmol/L (ref 20–29)
Calcium: 9.6 mg/dL (ref 8.7–10.3)
Chloride: 102 mmol/L (ref 96–106)
Creatinine, Ser: 1.49 mg/dL — ABNORMAL HIGH (ref 0.57–1.00)
Globulin, Total: 2.7 g/dL (ref 1.5–4.5)
Glucose: 89 mg/dL (ref 70–99)
Potassium: 4.8 mmol/L (ref 3.5–5.2)
Sodium: 137 mmol/L (ref 134–144)
Total Protein: 7.1 g/dL (ref 6.0–8.5)
eGFR: 35 mL/min/1.73 — ABNORMAL LOW (ref >59)

## 2024-06-11 LAB — CBC WITH DIFFERENTIAL/PLATELET
Basophils Absolute: 0.1 x10E3/uL (ref 0.0–0.2)
Basos: 1 %
EOS (ABSOLUTE): 0.3 x10E3/uL (ref 0.0–0.4)
Eos: 4 %
Hematocrit: 38 % (ref 34.0–46.6)
Hemoglobin: 12.2 g/dL (ref 11.1–15.9)
Immature Grans (Abs): 0 x10E3/uL (ref 0.0–0.1)
Immature Granulocytes: 0 %
Lymphocytes Absolute: 3.3 x10E3/uL — ABNORMAL HIGH (ref 0.7–3.1)
Lymphs: 37 %
MCH: 29.3 pg (ref 26.6–33.0)
MCHC: 32.1 g/dL (ref 31.5–35.7)
MCV: 91 fL (ref 79–97)
Monocytes Absolute: 0.8 x10E3/uL (ref 0.1–0.9)
Monocytes: 9 %
Neutrophils Absolute: 4.4 x10E3/uL (ref 1.4–7.0)
Neutrophils: 49 %
Platelets: 293 x10E3/uL (ref 150–450)
RBC: 4.17 x10E6/uL (ref 3.77–5.28)
RDW: 14 % (ref 11.7–15.4)
WBC: 8.9 x10E3/uL (ref 3.4–10.8)

## 2024-06-11 LAB — TSH: TSH: 5.06 u[IU]/mL — ABNORMAL HIGH (ref 0.450–4.500)

## 2024-06-13 ENCOUNTER — Ambulatory Visit: Payer: Self-pay | Admitting: Family

## 2024-06-15 NOTE — Telephone Encounter (Signed)
 Copied from CRM [id number]. Topic: Clinical - Lab/Test Results >> Jun 15, 2024  9:20 AM Turkey B wrote: Reason for CRM: I gave patient lab results, she wants to know why triglycerides werent tested

## 2024-06-16 LAB — LIPID PANEL W/O CHOL/HDL RATIO

## 2024-06-17 LAB — LIPID PANEL W/O CHOL/HDL RATIO
Cholesterol, Total: 199 mg/dL (ref 100–199)
HDL: 41 mg/dL (ref >39)
LDL Chol Calc (NIH): 92 mg/dL (ref 0–99)
Triglycerides: 402 mg/dL — AB (ref 0–149)
VLDL Cholesterol Cal: 66 mg/dL — AB (ref 5–40)

## 2024-06-17 LAB — SPECIMEN STATUS REPORT

## 2024-06-22 ENCOUNTER — Ambulatory Visit (INDEPENDENT_AMBULATORY_CARE_PROVIDER_SITE_OTHER): Admitting: *Deleted

## 2024-06-22 DIAGNOSIS — E538 Deficiency of other specified B group vitamins: Secondary | ICD-10-CM | POA: Diagnosis not present

## 2024-06-22 NOTE — Progress Notes (Signed)
 Patient is in office today for a nurse visit for B12 Injection.  Injection was given in the  Right deltoid. Patient tolerated injection well.

## 2024-06-30 ENCOUNTER — Encounter (INDEPENDENT_AMBULATORY_CARE_PROVIDER_SITE_OTHER): Payer: Self-pay | Admitting: Gastroenterology

## 2024-07-13 ENCOUNTER — Ambulatory Visit: Payer: Self-pay

## 2024-07-13 NOTE — Telephone Encounter (Signed)
 Patient is scheduled to see PCP tomorrow for splinter.

## 2024-07-13 NOTE — Telephone Encounter (Signed)
 FYI Only or Action Required?: FYI only for provider: appointment scheduled on 07/14/24.  Patient was last seen in primary care on 06/10/2024 by [name] LABOR, FNP.  Called Nurse Triage reporting No chief complaint on file..  Symptoms began today.  Interventions attempted: OTC medications: neosporin.  Symptoms are: unchanged.  Triage Disposition: See Physician Within 24 Hours  Patient/caregiver understands and will follow disposition?: Yes    Message from Miquel SAILOR sent at 07/13/2024 11:35 AM EST  Reason for Triage: RT foot hell/since 11/18/Black piece on heel part/Sore to touch. Tried to schedule app for 11/20 but Pt requesting sooner. Needs call back [phone]   Reason for Disposition  Looks like a boil, infected sore, or deep ulcer  Answer Assessment - Initial Assessment Questions 1. REASON FOR CALL: What is the main reason for your call? or How can I best help you? Filed it off and no longer there. No pain or bleeding. I am good for right now  and don't need appointment.  It was a splinter in side of heel.  Answer Assessment - Initial Assessment Questions No avaialble appts today. Scheduled 07/14/24.  Advised call back or UC if symptoms worsen.  1. ONSET: When did the pain start?      today 2. LOCATION: Where is the pain located?   Right,    Outer part of foot; splinter 3. PAIN: How bad is the pain?    (Scale 1-10; or mild, moderate, severe)     no 5. CAUSE: What do you think is causing the foot pain?     Walked on branches, it's a splinter 6. OTHER SYMPTOMS: Do you have any other symptoms? (e.g., leg pain, rash, fever, numbness)     No bleeding, pain,redness, drainage.  Protocols used: Information Only Call - No Triage-A-AH, Foot Pain-A-AH

## 2024-07-14 ENCOUNTER — Ambulatory Visit (INDEPENDENT_AMBULATORY_CARE_PROVIDER_SITE_OTHER): Admitting: Family

## 2024-07-14 ENCOUNTER — Encounter: Payer: Self-pay | Admitting: Family

## 2024-07-14 VITALS — BP 124/77 | HR 75 | Temp 98.0°F | Ht 62.0 in | Wt 185.0 lb

## 2024-07-14 DIAGNOSIS — R234 Changes in skin texture: Secondary | ICD-10-CM | POA: Diagnosis not present

## 2024-07-14 NOTE — Progress Notes (Signed)
 Subjective:    Patient ID: Susan Contreras, female    DOB: February 09, 1941, 83 y.o.   MRN: [id number]  Chief Complaint  Patient presents with   Foreign Body in Skin    Right foot and ingrown toe nail    HPI Pt presents to the office today with a splinter in her right foot that she noticed yesterday. Reports she can not bend down to get it and her husband can not see good enough to get it. Reports mild pain of 2 out 10 when walking that has resolved now.   She reports she was taking the trash out yesterday and stepped on a tree limb and thinks it is a piece of wood.    Review of Systems  All other systems reviewed and are negative.   Social History   Socioeconomic History   Marital status: Married    Spouse name: Not on file   Number of children: 2   Years of education: Not on file   Highest education level: Not on file  Occupational History   Occupation: Retired  Tobacco Use   Smoking status: Never   Smokeless tobacco: Never  Vaping Use   Vaping status: Never Used  Substance and Sexual Activity   Alcohol use: No   Drug use: No   Sexual activity: Not on file  Other Topics Concern   Not on file  Social History Narrative   2 caffeine drinks daily    Social Drivers of Health   Financial Resource Strain: Low Risk  (06/09/2024)   Overall Financial Resource Strain (CARDIA)    Difficulty of Paying Living Expenses: Not hard at all  Food Insecurity: No Food Insecurity (06/09/2024)   Hunger Vital Sign    Worried About Running Out of Food in the Last Year: Never true    Ran Out of Food in the Last Year: Never true  Transportation Needs: No Transportation Needs (06/09/2024)   PRAPARE - Administrator, Civil Service (Medical): No    Lack of Transportation (Non-Medical): No  Physical Activity: Insufficiently Active (06/09/2024)   Exercise Vital Sign    Days of Exercise per Week: 3 days    Minutes of Exercise per Session: 30 min  Stress: No Stress Concern Present  (06/09/2024)   [employer] of Occupational Health - Occupational Stress Questionnaire    Feeling of Stress: Only a little  Social Connections: Moderately Integrated (06/09/2024)   Social Connection and Isolation Panel    Frequency of Communication with Friends and Family: More than three times a week    Frequency of Social Gatherings with Friends and Family: More than three times a week    Attends Religious Services: More than 4 times per year    Active Member of [employer] or Organizations: No    Attends Engineer, Structural: Never    Marital Status: Married   Family History  Problem Relation Age of Onset   Colon cancer Sister    Stroke Mother    Heart disease Mother    Osteoporosis Mother    Parkinson's disease Father    Osteoporosis Father    Ovarian cancer Sister    Esophageal cancer Neg Hx    Stomach cancer Neg Hx    Kidney disease Neg Hx    Liver disease Neg Hx    Diabetes Neg Hx         Objective:   Physical Exam Vitals reviewed.  Constitutional:      General: She  is not in acute distress.    Appearance: She is well-developed.  HENT:     Head: Normocephalic and atraumatic.  Eyes:     Pupils: Pupils are equal, round, and reactive to light.  Neck:     Thyroid : No thyromegaly.  Cardiovascular:     Rate and Rhythm: Normal rate and regular rhythm.     Heart sounds: Normal heart sounds. No murmur heard. Pulmonary:     Effort: Pulmonary effort is normal. No respiratory distress.     Breath sounds: Normal breath sounds. No wheezing.  Abdominal:     General: Bowel sounds are normal. There is no distension.     Palpations: Abdomen is soft.     Tenderness: There is no abdominal tenderness.  Musculoskeletal:        General: No tenderness. Normal range of motion.     Cervical back: Normal range of motion and neck supple.       Feet:  Feet:     Comments: Small crack in right heel Skin:    General: Skin is warm and dry.  Neurological:     Mental Status:  She is alert and oriented to person, place, and time.     Cranial Nerves: No cranial nerve deficit.     Deep Tendon Reflexes: Reflexes are normal and symmetric.  Psychiatric:        Behavior: Behavior normal.        Thought Content: Thought content normal.        Judgment: Judgment normal.       BP 124/77   Pulse 75   Temp 98 F (36.7 C) (Temporal)   Ht 5' 2 (1.575 m)   Wt 185 lb (83.9 kg)   BMI 33.84 kg/m      Assessment & Plan:  Susan Contreras comes in today with chief complaint of Foreign Body in Skin (Right foot and ingrown toe nail)   Diagnosis and orders addressed:  1. Fissure in skin of right foot (Primary) No foreign body noted. Area washed with saline.  Discussed using thick moisturizer  After applying moisturizer can apply sock Avoid walking barefoot Avoid hot baths Follow up if symptoms worsen or do not improve    Bari Learn, FNP

## 2024-07-14 NOTE — Patient Instructions (Signed)
Dry Skin Care  What causes dry skin?  Dry skin is common and results from inadequate moisture in the outer skin layers. Dry skin usually results from the excessive loss of moisture from the skin surface. This occurs due to two major factors: Normally the skin's oil glands deposit a layer of oil on the skin's surface. This layer of oil prevents the loss of moisture from the skin. Exposure to soaps, cleaners, solvents, and disinfectants removes this oily film, allowing water to escape. Water loss from the skin increases when the humidity is low. During winter months we spend a lot of time indoors where the air is heated. Heated air has very low humidity. This also contributes to dry skin.  A tendency for dry skin may accompany such disorders as eczema. Also, as people age, the number of functioning oil glands decreases, and the tendency toward dry skin can be a sensation of skin tightness when emerging from the shower.  How do I manage dry skin?  Humidify your environment. This can be accomplished by using a humidifier in your bedroom at night during winter months. Bathing can actually put moisture back into your skin if done right. Take the following steps while bathing to sooth dry skin: Avoid hot water, which only dries the skin and makes itching worse. Use warm water. Avoid washcloths or extensive rubbing or scrubbing. Use mild soaps like unscented Dove, Oil of Olay, Cetaphil, Basis, or CeraVe. If you take baths rather than showers, rinse off soap residue with clean water before getting out of tub. Once out of the shower/tub, pat dry gently with a soft towel. Leave your skin damp. While still damp, apply any medicated ointment/cream you were prescribed to the affected areas. After you apply your medicated ointment/cream, then apply your moisturizer to your whole body.This is the most important step in dry skin care. If this is omitted, your skin will continue to be dry. The choice of  moisturizer is also very important. In general, lotion will not provider enough moisture to severely dry skin because it is water based. You should use an ointment or cream. Moisturizers should also be unscented. Good choices include Vaseline (plain petrolatum), Aquaphor, Cetaphil, CeraVe, Vanicream, DML Forte, Aveeno moisture, or Eucerin Cream. Bath oils can be helpful, but do not replace the application of moisturizer after the bath. In addition, they make the tub slippery causing an increased risk for falls. Therefore, we do not recommend their use. 

## 2024-07-20 ENCOUNTER — Ambulatory Visit: Payer: Self-pay

## 2024-07-27 ENCOUNTER — Ambulatory Visit

## 2024-08-03 ENCOUNTER — Telehealth: Payer: Self-pay | Admitting: Family Medicine

## 2024-08-03 NOTE — Telephone Encounter (Signed)
 Copied from CRM #[id number]. Topic: Appointments - Scheduling Inquiry for Clinic >> Aug 03, 2024  9:25 AM Susanna ORN wrote: Reason for CRM: Patient called stating that she received a call and missed it. There is no notes about a call. Called [facility] & spoke with Montie and she stated it was the automated system calling about lab appt reminder. Patient states she doesn't know anything about a lab appt and wanted to know what was it for. Explained that it was for a thyroid  recheck and patient got upset with me because she stated that no one told her about it and she had blood work done just a few months ago. She's wanting Dr. Lavell' nurse, Rosina, to give her a call. CB #: [phone].

## 2024-08-03 NOTE — Telephone Encounter (Signed)
 Called and spoke with patient. Aware what the lab is for she will just wait till she sees Christy in Jan.

## 2024-08-09 ENCOUNTER — Other Ambulatory Visit: Payer: Self-pay | Admitting: Family

## 2024-08-09 DIAGNOSIS — J3089 Other allergic rhinitis: Secondary | ICD-10-CM

## 2024-08-10 ENCOUNTER — Other Ambulatory Visit: Payer: Self-pay

## 2024-08-11 ENCOUNTER — Ambulatory Visit (INDEPENDENT_AMBULATORY_CARE_PROVIDER_SITE_OTHER): Admitting: Family

## 2024-08-11 ENCOUNTER — Encounter: Payer: Self-pay | Admitting: Family

## 2024-08-11 VITALS — BP 91/66 | HR 82 | Temp 98.0°F | Ht 62.0 in | Wt 186.0 lb

## 2024-08-11 DIAGNOSIS — J0111 Acute recurrent frontal sinusitis: Secondary | ICD-10-CM | POA: Diagnosis not present

## 2024-08-11 NOTE — Patient Instructions (Signed)

## 2024-08-11 NOTE — Progress Notes (Signed)
 Subjective:    Patient ID: Susan Contreras, female    DOB: [birth year], 83 y.o.   MRN: [id number]  Chief Complaint  Patient presents with   Sinusitis   PT presents to the office today with recurrent sinusitis. Reports headache, ear pain, and dizziness for the last week. She has multiple allergies and can not tolerate high dose antibiotics. She refuses ENT referral.   She has been Norel, zyrtec , and flonase .  Sinusitis This is a recurrent problem. The current episode started in the past 7 days. The problem has been gradually worsening since onset. There has been no fever. Her pain is at a severity of 7/10. The pain is moderate. Associated symptoms include congestion, coughing, ear pain, headaches, sinus pressure, sneezing and a sore throat. Pertinent negatives include no shortness of breath. Past treatments include oral decongestants. The treatment provided mild relief.      Review of Systems  HENT:  Positive for congestion, ear pain, sinus pressure, sneezing and sore throat.   Respiratory:  Positive for cough. Negative for shortness of breath.   Neurological:  Positive for headaches.  All other systems reviewed and are negative.   Social History   Socioeconomic History   Marital status: Married    Spouse name: Not on file   Number of children: 2   Years of education: Not on file   Highest education level: Not on file  Occupational History   Occupation: Retired  Tobacco Use   Smoking status: Never   Smokeless tobacco: Never  Vaping Use   Vaping status: Never Used  Substance and Sexual Activity   Alcohol use: No   Drug use: No   Sexual activity: Not on file  Other Topics Concern   Not on file  Social History Narrative   2 caffeine drinks daily    Social Drivers of Health   Tobacco Use: Low Risk (08/11/2024)   Patient History    Smoking Tobacco Use: Never    Smokeless Tobacco Use: Never    Passive Exposure: Not on file  Financial Resource Strain: Low Risk  (06/09/2024)   Overall Financial Resource Strain (CARDIA)    Difficulty of Paying Living Expenses: Not hard at all  Food Insecurity: No Food Insecurity (06/09/2024)   Epic    Worried About Programme Researcher, Broadcasting/film/video in the Last Year: Never true    Ran Out of Food in the Last Year: Never true  Transportation Needs: No Transportation Needs (06/09/2024)   Epic    Lack of Transportation (Medical): No    Lack of Transportation (Non-Medical): No  Physical Activity: Insufficiently Active (06/09/2024)   Exercise Vital Sign    Days of Exercise per Week: 3 days    Minutes of Exercise per Session: 30 min  Stress: No Stress Concern Present (06/09/2024)   [employer] of Occupational Health - Occupational Stress Questionnaire    Feeling of Stress: Only a little  Social Connections: Moderately Integrated (06/09/2024)   Social Connection and Isolation Panel    Frequency of Communication with Friends and Family: More than three times a week    Frequency of Social Gatherings with Friends and Family: More than three times a week    Attends Religious Services: More than 4 times per year    Active Member of Clubs or Organizations: No    Attends Banker Meetings: Never    Marital Status: Married  Depression (PHQ2-9): Low Risk (06/09/2024)   Depression (PHQ2-9)    PHQ-2 Score: 0  Alcohol Screen: Low Risk (06/09/2024)   Alcohol Screen    Last Alcohol Screening Score (AUDIT): 0  Housing: Unknown (06/09/2024)   Epic    Unable to Pay for Housing in the Last Year: No    Number of Times Moved in the Last Year: Not on file    Homeless in the Last Year: No  Utilities: Not At Risk (06/09/2024)   Epic    Threatened with loss of utilities: No  Health Literacy: Adequate Health Literacy (06/09/2024)   B1300 Health Literacy    Frequency of need for help with medical instructions: Never   Family History  Problem Relation Age of Onset   Colon cancer Sister    Stroke Mother    Heart disease  Mother    Osteoporosis Mother    Parkinson's disease Father    Osteoporosis Father    Ovarian cancer Sister    Esophageal cancer Neg Hx    Stomach cancer Neg Hx    Kidney disease Neg Hx    Liver disease Neg Hx    Diabetes Neg Hx         Objective:   Physical Exam Vitals reviewed.  Constitutional:      General: She is not in acute distress.    Appearance: She is well-developed.  HENT:     Head: Normocephalic and atraumatic.     Right Ear: Tympanic membrane normal.     Left Ear: Tympanic membrane normal.     Nose:     Right Sinus: Frontal sinus tenderness present.     Left Sinus: Frontal sinus tenderness present.  Eyes:     Pupils: Pupils are equal, round, and reactive to light.  Neck:     Thyroid : No thyromegaly.  Cardiovascular:     Rate and Rhythm: Normal rate and regular rhythm.     Heart sounds: Normal heart sounds. No murmur heard. Pulmonary:     Effort: Pulmonary effort is normal. No respiratory distress.     Breath sounds: Normal breath sounds. No wheezing.  Abdominal:     General: Bowel sounds are normal. There is no distension.     Palpations: Abdomen is soft.     Tenderness: There is no abdominal tenderness.  Musculoskeletal:        General: No tenderness. Normal range of motion.     Cervical back: Normal range of motion and neck supple.  Skin:    General: Skin is warm and dry.  Neurological:     Mental Status: She is alert and oriented to person, place, and time.     Cranial Nerves: No cranial nerve deficit.     Deep Tendon Reflexes: Reflexes are normal and symmetric.  Psychiatric:        Behavior: Behavior normal.        Thought Content: Thought content normal.        Judgment: Judgment normal.       BP (!) 153/85   Pulse 82   Temp 98 F (36.7 C) (Temporal)   Ht 5' 2 (1.575 m)   Wt 186 lb (84.4 kg)   SpO2 95%   BMI 34.02 kg/m      Assessment & Plan:  Susan Contreras comes in today with chief complaint of Sinusitis   Diagnosis and  orders addressed:  1. Acute recurrent frontal sinusitis (Primary) - Take meds as prescribed - Use a cool mist humidifier  -Use saline nose sprays frequently -Force fluids -For any cough or congestion  Use plain  Mucinex- regular strength or max strength is fine -For fever or aces or pains- take tylenol or ibuprofen. -Throat lozenges if help -Follow up if symptoms worsen or do not improve  - amoxicillin  (AMOXIL ) 500 MG capsule; Take 1 capsule (500 mg total) by mouth 2 (two) times daily for 10 days.  Dispense: 20 capsule; Refill: 0     Bari Learn, FNP

## 2024-08-22 ENCOUNTER — Other Ambulatory Visit: Payer: Self-pay

## 2024-09-12 ENCOUNTER — Encounter: Payer: Self-pay | Admitting: Family

## 2024-09-12 ENCOUNTER — Ambulatory Visit (INDEPENDENT_AMBULATORY_CARE_PROVIDER_SITE_OTHER): Payer: Self-pay | Admitting: Family

## 2024-09-12 VITALS — BP 132/84 | HR 83 | Temp 97.0°F | Ht 62.0 in | Wt 185.0 lb

## 2024-09-12 DIAGNOSIS — I159 Secondary hypertension, unspecified: Secondary | ICD-10-CM

## 2024-09-12 DIAGNOSIS — Z79899 Other long term (current) drug therapy: Secondary | ICD-10-CM

## 2024-09-12 DIAGNOSIS — F1320 Sedative, hypnotic or anxiolytic dependence, uncomplicated: Secondary | ICD-10-CM

## 2024-09-12 DIAGNOSIS — G4700 Insomnia, unspecified: Secondary | ICD-10-CM

## 2024-09-12 DIAGNOSIS — N1832 Chronic kidney disease, stage 3b: Secondary | ICD-10-CM | POA: Diagnosis not present

## 2024-09-12 DIAGNOSIS — J0111 Acute recurrent frontal sinusitis: Secondary | ICD-10-CM

## 2024-09-12 DIAGNOSIS — E039 Hypothyroidism, unspecified: Secondary | ICD-10-CM | POA: Diagnosis not present

## 2024-09-12 DIAGNOSIS — E785 Hyperlipidemia, unspecified: Secondary | ICD-10-CM

## 2024-09-12 DIAGNOSIS — K219 Gastro-esophageal reflux disease without esophagitis: Secondary | ICD-10-CM | POA: Diagnosis not present

## 2024-09-12 DIAGNOSIS — F411 Generalized anxiety disorder: Secondary | ICD-10-CM | POA: Diagnosis not present

## 2024-09-12 MED ORDER — AMOXICILLIN 500 MG PO CAPS: 500.0000 mg | ORAL_CAPSULE | Freq: Three times a day (TID) | ORAL | 0 refills | Status: AC

## 2024-09-12 NOTE — Progress Notes (Signed)
 "  Subjective:    Patient ID: Susan Contreras, female    DOB: May 12, 1941, 84 y.o.   MRN: [id number]  Chief Complaint  Patient presents with   Medical Management of Chronic Issues    Patient states she is wobbly on her feet, pressure behind eyes and nose.   Pt calls the office today for chronic follow up.   She has CKD and avoid NSAID's.    She is obese with a BMI of 33 with HTN and GERD.   Hypertension This is a chronic problem. The current episode started more than 1 year ago. The problem has been resolved since onset. The problem is controlled. Associated symptoms include anxiety, headaches and malaise/fatigue. Pertinent negatives include no peripheral edema or shortness of breath. Risk factors for coronary artery disease include dyslipidemia, sedentary lifestyle and obesity. The current treatment provides moderate improvement. Identifiable causes of hypertension include a thyroid  problem.  Gastroesophageal Reflux She complains of belching, coughing, heartburn and a sore throat. This is a chronic problem. The current episode started more than 1 year ago. The problem occurs occasionally. The symptoms are aggravated by medications. Associated symptoms include fatigue. Risk factors include obesity. She has tried a PPI for the symptoms. The treatment provided moderate relief.  Thyroid  Problem Presents for follow-up visit. Symptoms include anxiety and fatigue. Patient reports no constipation, depressed mood, diarrhea or dry skin. The symptoms have been stable.  Insomnia Primary symptoms: difficulty falling asleep, malaise/fatigue.   The current episode started more than one year. The problem occurs intermittently. Past treatments include medication. The treatment provided moderate relief.  Hyperlipidemia This is a chronic problem. The current episode started more than 1 year ago. The problem is uncontrolled. Recent lipid tests were reviewed and are high. Exacerbating diseases include obesity.  Pertinent negatives include no shortness of breath. Current antihyperlipidemic treatment includes statins. The current treatment provides moderate improvement of lipids. Risk factors for coronary artery disease include dyslipidemia, hypertension, a sedentary lifestyle, post-menopausal and obesity.  Anxiety Presents for follow-up visit. Symptoms include excessive worry, insomnia, nervous/anxious behavior and restlessness. Patient reports no depressed mood or shortness of breath. Symptoms occur occasionally. The severity of symptoms is moderate.    Sinusitis This is a chronic problem. The current episode started in the past 7 days. The problem has been gradually worsening since onset. There has been no fever. Her pain is at a severity of 6/10. Associated symptoms include congestion, coughing, ear pain (left), headaches, sinus pressure, sneezing and a sore throat. Pertinent negatives include no shortness of breath. Past treatments include oral decongestants and nasal decongestants. The treatment provided mild relief.      Review of Systems  Constitutional:  Positive for fatigue and malaise/fatigue.  HENT:  Positive for congestion, ear pain (left), sinus pressure, sneezing and sore throat.   Respiratory:  Positive for cough. Negative for shortness of breath.   Gastrointestinal:  Positive for heartburn. Negative for constipation and diarrhea.  Neurological:  Positive for headaches.  Psychiatric/Behavioral:  The patient is nervous/anxious and has insomnia.   All other systems reviewed and are negative.   Family History  Problem Relation Age of Onset   Colon cancer Sister    Stroke Mother    Heart disease Mother    Osteoporosis Mother    Parkinson's disease Father    Osteoporosis Father    Ovarian cancer Sister    Esophageal cancer Neg Hx    Stomach cancer Neg Hx    Kidney disease Neg Hx  Liver disease Neg Hx    Diabetes Neg Hx    Social History   Socioeconomic History   Marital  status: Married    Spouse name: Not on file   Number of children: 2   Years of education: Not on file   Highest education level: Not on file  Occupational History   Occupation: Retired  Tobacco Use   Smoking status: Never   Smokeless tobacco: Never  Vaping Use   Vaping status: Never Used  Substance and Sexual Activity   Alcohol use: No   Drug use: No   Sexual activity: Not on file  Other Topics Concern   Not on file  Social History Narrative   2 caffeine drinks daily    Social Drivers of Health   Tobacco Use: Low Risk (09/12/2024)   Patient History    Smoking Tobacco Use: Never    Smokeless Tobacco Use: Never    Passive Exposure: Not on file  Financial Resource Strain: Low Risk (06/09/2024)   Overall Financial Resource Strain (CARDIA)    Difficulty of Paying Living Expenses: Not hard at all  Food Insecurity: No Food Insecurity (06/09/2024)   Epic    Worried About Programme Researcher, Broadcasting/film/video in the Last Year: Never true    Ran Out of Food in the Last Year: Never true  Transportation Needs: No Transportation Needs (06/09/2024)   Epic    Lack of Transportation (Medical): No    Lack of Transportation (Non-Medical): No  Physical Activity: Insufficiently Active (06/09/2024)   Exercise Vital Sign    Days of Exercise per Week: 3 days    Minutes of Exercise per Session: 30 min  Stress: No Stress Concern Present (06/09/2024)   [employer] of Occupational Health - Occupational Stress Questionnaire    Feeling of Stress: Only a little  Social Connections: Moderately Integrated (06/09/2024)   Social Connection and Isolation Panel    Frequency of Communication with Friends and Family: More than three times a week    Frequency of Social Gatherings with Friends and Family: More than three times a week    Attends Religious Services: More than 4 times per year    Active Member of Clubs or Organizations: No    Attends Banker Meetings: Never    Marital Status: Married   Depression (PHQ2-9): Low Risk (09/12/2024)   Depression (PHQ2-9)    PHQ-2 Score: 0  Alcohol Screen: Low Risk (06/09/2024)   Alcohol Screen    Last Alcohol Screening Score (AUDIT): 0  Housing: Unknown (06/09/2024)   Epic    Unable to Pay for Housing in the Last Year: No    Number of Times Moved in the Last Year: Not on file    Homeless in the Last Year: No  Utilities: Not At Risk (06/09/2024)   Epic    Threatened with loss of utilities: No  Health Literacy: Adequate Health Literacy (06/09/2024)   B1300 Health Literacy    Frequency of need for help with medical instructions: Never       Objective:   Physical Exam Vitals reviewed.  Constitutional:      General: She is not in acute distress.    Appearance: She is well-developed. She is obese.  HENT:     Head: Normocephalic and atraumatic.     Right Ear: Tympanic membrane normal.     Left Ear: Tympanic membrane normal.     Nose:     Right Sinus: Maxillary sinus tenderness present.  Left Sinus: Maxillary sinus tenderness present.     Mouth/Throat:     Pharynx: Posterior oropharyngeal erythema present.  Eyes:     Pupils: Pupils are equal, round, and reactive to light.  Neck:     Thyroid : No thyromegaly.  Cardiovascular:     Rate and Rhythm: Regular rhythm.     Heart sounds: Normal heart sounds. No murmur heard. Pulmonary:     Effort: Pulmonary effort is normal. No respiratory distress.     Breath sounds: Normal breath sounds. No wheezing.  Abdominal:     General: Bowel sounds are normal. There is no distension.     Palpations: Abdomen is soft.     Tenderness: There is no abdominal tenderness.  Musculoskeletal:        General: No tenderness. Normal range of motion.     Cervical back: Normal range of motion and neck supple.  Skin:    General: Skin is warm and dry.  Neurological:     Mental Status: She is alert and oriented to person, place, and time.     Cranial Nerves: No cranial nerve deficit.     Deep Tendon  Reflexes: Reflexes are normal and symmetric.  Psychiatric:        Behavior: Behavior normal.        Thought Content: Thought content normal.        Judgment: Judgment normal.       BP 132/84   Pulse 83   Temp (!) 97 F (36.1 C) (Temporal)   Ht 5' 2 (1.575 m)   Wt 185 lb (83.9 kg)   SpO2 96%   BMI 33.84 kg/m      Assessment & Plan:   Brynlyn Dade comes in today with chief complaint of Medical Management of Chronic Issues (Patient states she is wobbly on her feet, pressure behind eyes and nose.)   Diagnosis and orders addressed:  1. Benzodiazepine dependence (HCC) - CMP14+EGFR  2. CKD stage 3b, GFR 30-44 ml/min (HCC) - CMP14+EGFR  3. Controlled substance agreement signed - CMP14+EGFR  4. GAD (generalized anxiety disorder) - CMP14+EGFR  5. Gastroesophageal reflux disease, unspecified whether esophagitis present - CMP14+EGFR  6. Hyperlipidemia, unspecified hyperlipidemia type  - CMP14+EGFR  7. Secondary hypertension - CMP14+EGFR  8. Hypothyroidism, unspecified type (Primary)  - CMP14+EGFR - TSH  9. Insomnia, unspecified type - CMP14+EGFR  10. Acute recurrent frontal sinusitis - Take meds as prescribed - Use a cool mist humidifier  -Use saline nose sprays frequently -Force fluids -For any cough or congestion  Use plain Mucinex- regular strength or max strength is fine -For fever or aces or pains- take tylenol or ibuprofen. -Throat lozenges if help -Pt states she can not tolerate any other antibiotics other than plain amoxicillin  500 mg. Referral pending to ENT and allergen. Discussed multiple times we can not continue antibiotics.  - Ambulatory referral to Allergy - amoxicillin  (AMOXIL ) 500 MG capsule; Take 1 capsule (500 mg total) by mouth 3 (three) times daily for 10 days.  Dispense: 30 capsule; Refill: 0 - CMP14+EGFR - Ambulatory referral to ENT   Labs pending  Patient reviewed in [place] controlled database, no flags noted. Contract and drug  screen up dated today.  Referral to ENT and Allergen pending, continue zyrtec , Norel, and flonase  Continue current medications  Health Maintenance reviewed Diet and exercise encouraged  Follow up plan: 3 months    Bari Learn, FNP  "

## 2024-09-12 NOTE — Patient Instructions (Signed)

## 2024-09-13 ENCOUNTER — Ambulatory Visit: Payer: Self-pay | Admitting: Family

## 2024-09-13 DIAGNOSIS — N289 Disorder of kidney and ureter, unspecified: Secondary | ICD-10-CM

## 2024-09-13 LAB — CMP14+EGFR
ALT: 8 IU/L (ref 0–32)
AST: 15 IU/L (ref 0–40)
Albumin: 4.4 g/dL (ref 3.7–4.7)
Alkaline Phosphatase: 97 IU/L (ref 48–129)
BUN/Creatinine Ratio: 7 — ABNORMAL LOW (ref 12–28)
BUN: 11 mg/dL (ref 8–27)
Bilirubin Total: 0.5 mg/dL (ref 0.0–1.2)
CO2: 19 mmol/L — ABNORMAL LOW (ref 20–29)
Calcium: 9.3 mg/dL (ref 8.7–10.3)
Chloride: 101 mmol/L (ref 96–106)
Creatinine, Ser: 1.65 mg/dL — ABNORMAL HIGH (ref 0.57–1.00)
Globulin, Total: 3 g/dL (ref 1.5–4.5)
Glucose: 84 mg/dL (ref 70–99)
Potassium: 4.5 mmol/L (ref 3.5–5.2)
Sodium: 136 mmol/L (ref 134–144)
Total Protein: 7.4 g/dL (ref 6.0–8.5)
eGFR: 31 mL/min/1.73 — ABNORMAL LOW

## 2024-09-13 LAB — TSH: TSH: 4.72 u[IU]/mL — AB (ref 0.450–4.500)

## 2024-09-13 MED ORDER — LEVOTHYROXINE SODIUM 75 MCG PO TABS: 75.0000 ug | ORAL_TABLET | Freq: Every day | ORAL | 3 refills | Status: AC

## 2024-09-13 NOTE — Telephone Encounter (Signed)
 Patient aware and verbalizes understanding. Lab appointment scheduled and future lab placed

## 2024-09-17 DIAGNOSIS — K219 Gastro-esophageal reflux disease without esophagitis: Secondary | ICD-10-CM

## 2024-09-20 ENCOUNTER — Other Ambulatory Visit

## 2024-11-30 ENCOUNTER — Ambulatory Visit: Admitting: Allergy & Immunology

## 2025-06-12 ENCOUNTER — Ambulatory Visit
